# Patient Record
Sex: Male | Born: 1935 | Race: White | Hispanic: No | Marital: Married | Smoking: Former smoker
Health system: Southern US, Community
[De-identification: ages and names within clinical notes are randomized; demographics above are authoritative.]

## PROBLEM LIST (undated history)

## (undated) DIAGNOSIS — K219 Gastro-esophageal reflux disease without esophagitis: Secondary | ICD-10-CM

## (undated) DIAGNOSIS — N3090 Cystitis, unspecified without hematuria: Secondary | ICD-10-CM

## (undated) DIAGNOSIS — I5042 Chronic combined systolic (congestive) and diastolic (congestive) heart failure: Secondary | ICD-10-CM

## (undated) DIAGNOSIS — R001 Bradycardia, unspecified: Secondary | ICD-10-CM

## (undated) DIAGNOSIS — M2550 Pain in unspecified joint: Secondary | ICD-10-CM

## (undated) DIAGNOSIS — M1990 Unspecified osteoarthritis, unspecified site: Secondary | ICD-10-CM

## (undated) DIAGNOSIS — I714 Abdominal aortic aneurysm, without rupture, unspecified: Secondary | ICD-10-CM

## (undated) DIAGNOSIS — I441 Atrioventricular block, second degree: Secondary | ICD-10-CM

## (undated) HISTORY — PX: BACK SURGERY: SHX140

## (undated) HISTORY — DX: Bradycardia, unspecified: R00.1

## (undated) HISTORY — DX: Unspecified osteoarthritis, unspecified site: M19.90

## (undated) HISTORY — DX: Atrioventricular block, second degree: I44.1

## (undated) HISTORY — PX: TOTAL KNEE ARTHROPLASTY: SHX125

## (undated) HISTORY — DX: Abdominal aortic aneurysm, without rupture: I71.4

## (undated) HISTORY — DX: Abdominal aortic aneurysm, without rupture, unspecified: I71.40

## (undated) HISTORY — PX: EXCISIONAL HEMORRHOIDECTOMY: SHX1541

## (undated) HISTORY — DX: Chronic combined systolic (congestive) and diastolic (congestive) heart failure: I50.42

## (undated) HISTORY — PX: TYMPANOSTOMY TUBE PLACEMENT: SHX32

## (undated) HISTORY — PX: JOINT REPLACEMENT: SHX530

## (undated) HISTORY — PX: CATARACT EXTRACTION W/ INTRAOCULAR LENS  IMPLANT, BILATERAL: SHX1307

## (undated) HISTORY — PX: HEMORROIDECTOMY: SUR656

## (undated) HISTORY — PX: INSERT / REPLACE / REMOVE PACEMAKER: SUR710

## (undated) HISTORY — DX: Gastro-esophageal reflux disease without esophagitis: K21.9

## (undated) HISTORY — DX: Pain in unspecified joint: M25.50

---

## 1989-05-13 HISTORY — PX: INGUINAL HERNIA REPAIR: SUR1180

## 1997-05-13 HISTORY — PX: TOTAL SHOULDER REPLACEMENT: SUR1217

## 2001-10-23 ENCOUNTER — Encounter (HOSPITAL_COMMUNITY): Admission: RE | Admit: 2001-10-23 | Discharge: 2001-11-22 | Payer: Self-pay | Admitting: Orthopaedic Surgery

## 2001-12-25 ENCOUNTER — Encounter: Payer: Self-pay | Admitting: Orthopaedic Surgery

## 2001-12-31 ENCOUNTER — Inpatient Hospital Stay (HOSPITAL_COMMUNITY): Admission: RE | Admit: 2001-12-31 | Discharge: 2002-01-03 | Payer: Self-pay | Admitting: Orthopaedic Surgery

## 2003-05-14 HISTORY — PX: LUMBAR DISC SURGERY: SHX700

## 2003-12-07 ENCOUNTER — Encounter: Admission: RE | Admit: 2003-12-07 | Discharge: 2003-12-07 | Payer: Self-pay | Admitting: Family Medicine

## 2003-12-15 ENCOUNTER — Encounter: Admission: RE | Admit: 2003-12-15 | Discharge: 2003-12-15 | Payer: Self-pay | Admitting: Orthopaedic Surgery

## 2004-01-03 ENCOUNTER — Inpatient Hospital Stay (HOSPITAL_COMMUNITY): Admission: RE | Admit: 2004-01-03 | Discharge: 2004-01-04 | Payer: Self-pay | Admitting: Neurosurgery

## 2004-12-18 ENCOUNTER — Inpatient Hospital Stay (HOSPITAL_COMMUNITY): Admission: RE | Admit: 2004-12-18 | Discharge: 2004-12-20 | Payer: Self-pay | Admitting: Orthopaedic Surgery

## 2005-01-01 ENCOUNTER — Encounter (HOSPITAL_COMMUNITY): Admission: RE | Admit: 2005-01-01 | Discharge: 2005-02-08 | Payer: Self-pay | Admitting: Orthopaedic Surgery

## 2005-02-11 ENCOUNTER — Encounter (HOSPITAL_COMMUNITY): Admission: RE | Admit: 2005-02-11 | Discharge: 2005-03-13 | Payer: Self-pay | Admitting: Orthopaedic Surgery

## 2006-07-03 ENCOUNTER — Ambulatory Visit: Payer: Self-pay | Admitting: Internal Medicine

## 2006-07-07 ENCOUNTER — Ambulatory Visit (HOSPITAL_COMMUNITY): Admission: RE | Admit: 2006-07-07 | Discharge: 2006-07-07 | Payer: Self-pay | Admitting: Internal Medicine

## 2006-07-07 ENCOUNTER — Ambulatory Visit: Payer: Self-pay | Admitting: Internal Medicine

## 2006-08-20 ENCOUNTER — Ambulatory Visit: Payer: Self-pay | Admitting: Internal Medicine

## 2006-11-20 ENCOUNTER — Encounter: Admission: RE | Admit: 2006-11-20 | Discharge: 2006-11-20 | Payer: Self-pay | Admitting: Orthopaedic Surgery

## 2006-11-28 ENCOUNTER — Encounter (HOSPITAL_COMMUNITY): Admission: RE | Admit: 2006-11-28 | Discharge: 2006-12-28 | Payer: Self-pay | Admitting: Orthopaedic Surgery

## 2006-12-30 ENCOUNTER — Encounter (HOSPITAL_COMMUNITY): Admission: RE | Admit: 2006-12-30 | Discharge: 2007-01-29 | Payer: Self-pay | Admitting: Orthopaedic Surgery

## 2009-02-17 ENCOUNTER — Ambulatory Visit (HOSPITAL_COMMUNITY): Admission: RE | Admit: 2009-02-17 | Discharge: 2009-02-17 | Payer: Self-pay | Admitting: Family Medicine

## 2009-02-24 ENCOUNTER — Ambulatory Visit (HOSPITAL_COMMUNITY): Admission: RE | Admit: 2009-02-24 | Discharge: 2009-02-24 | Payer: Self-pay | Admitting: Family Medicine

## 2009-03-03 ENCOUNTER — Ambulatory Visit: Payer: Self-pay | Admitting: Vascular Surgery

## 2009-08-11 ENCOUNTER — Ambulatory Visit: Payer: Self-pay | Admitting: Vascular Surgery

## 2010-03-08 ENCOUNTER — Ambulatory Visit: Payer: Self-pay | Admitting: Vascular Surgery

## 2010-05-31 ENCOUNTER — Ambulatory Visit
Admission: RE | Admit: 2010-05-31 | Discharge: 2010-05-31 | Payer: Self-pay | Source: Home / Self Care | Attending: Otolaryngology | Admitting: Otolaryngology

## 2010-06-03 ENCOUNTER — Encounter: Payer: Self-pay | Admitting: Orthopaedic Surgery

## 2010-09-04 ENCOUNTER — Encounter (INDEPENDENT_AMBULATORY_CARE_PROVIDER_SITE_OTHER): Payer: Medicare Other

## 2010-09-04 DIAGNOSIS — I714 Abdominal aortic aneurysm, without rupture: Secondary | ICD-10-CM

## 2010-09-06 NOTE — Procedures (Unsigned)
DUPLEX ULTRASOUND OF ABDOMINAL AORTA  INDICATION:  Follow up abdominal aortic aneurysm.  HISTORY: Diabetes:  No. Cardiac:  No. Hypertension:  No. Smoking:  Previous. Connective Tissue Disorder: Family History: Previous Surgery:  No.  DUPLEX EXAM:         AP (cm)                   TRANSVERSE (cm) Proximal             2.2 cm                    2.3 cm Mid                  4.8 cm                    4.9 cm Distal               3.0 cm                    3.0 cm Right Iliac          1.5 cm                    2.3 cm Left Iliac           1.8 cm                    2.0 cm  PREVIOUS:  Date:  AP:  4.8  TRANSVERSE:  4.9  IMPRESSION:  Stable-appearing abdominal aortic aneurysm within the mid aorta with measurements of 4.9 X 4.8 cm with intraluminal thrombus and atherosclerosis visualized.  Bilateral ectatic iliacs with moderate atherosclerosis.  ___________________________________________ Larina Earthly, M.D.  OD/MEDQ  D:  09/04/2010  T:  09/04/2010  Job:  [id number]

## 2010-09-25 NOTE — Consult Note (Signed)
NEW PATIENT CONSULTATION   Shawn Frye, Shawn Frye  DOB:  02-27-1919                                       03/03/2009  CHART#:[id number]   PRESENTING ILLNESS:  Asymptomatic abdominal aortic aneurysm.   The patient presents today for evaluation of incidental finding of  abdominal aortic aneurysm.  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  He reports that  about 2 to 3 weeks ago, he had a sudden onset of pain in his back, this  extended into his left leg.  He reports this has become progressive in  nature since that time.  He reports that this is relieved slightly with  lying flat.  He reports this is a severe discomfort and he has to walk  with a walker due to this.  The back pain does continue down into his  leg.  He had a similar type of discomfort 5 years ago at which time he  had what sounds like microdiskectomy with complete improvement.  Part of  his workup included a MRI. I have this and have reviewed the actual  films and discussed it with the patient and his wife.  This does reveal  an infrarenal abdominal aortic aneurysm, maximal diameter approximately  4.9 cm.  He had no prior knowledge and no symptoms related to his  aneurysm.   PAST MEDICAL HISTORY:  Significant for no major medical difficulties.  He denies any cardiac or pulmonary dysfunction.   FAMILY HISTORY:  Negative for aneurysm disease.   SOCIAL HISTORY:  He is married, does not have children.  He quit smoking  in 1952 and does not drink alcohol.   PAST SURGICAL HISTORY:  Significant for hemorrhoidectomy, bilateral  hernia repair, total knee replacements bilaterally, shoulder replacement  and prior back surgery as described.   REVIEW OF SYSTEMS:  Weight is reported at 160 pounds.  He is 5 feet 8  inches tall.  He has no cardiac, pulmonary, GI or GU difficulty.  He  does have positive difficulty with musculoskeletal arthritic joint pain.  No  neurologic, psychiatric, hematologic or skin issues.   PHYSICAL EXAMINATION:  General:  He is a well developed white male in no  acute distress.  Neck:  He has no JVD, no neck masses and no carotid  bruits.  Lungs:  No tachypnea; chest is clear bilaterally with no  wheezes.  Cardiovascular:  He has a regular rhythm and normal heart  sounds.  He has mild left leg edema.  No edema on the right leg.  He  does have a 2+ posterior tibial pulses bilaterally.  Abdominal Exam;  Soft.  He has normal active bowel sounds.  He does have a prominent  aortic pulsation.  He is nontender over this.  Musculoskeletal:  No  deformities but he does have some cyanosis and some swelling in his left  leg. Neurological:  He has no focal deficits.  Skin: Warm with no  lesions or rashes.   I had a long discussion with the patient and his wife.  I reviewed the  MRI with him, explaining the significance of his infrarenal abdominal  aortic aneurysm.  I discussed the association of this with the disk  disease and do not feel that there is any reason why he cannot proceed  with any  of disk treatment required for relief of symptoms.  I did  review the records as provided by Dr. Gerda Diss.  He appears to be an  excellent candidate for aortic surgery should he require this, despite  his age of 70.  I reviewed symptoms of leaking aneurysm with him and do  not feel that any of his back symptoms are related to this.  I explained  importance of follow-up and we will see him in 6 months with repeat  ultrasound for long-term follow-up of his aneurysm size.   Larina Earthly, M.D.  Electronically Signed   TFE/MEDQ  D:  03/03/2009  T:  03/06/2009  Job:  [id number]   cc:   Donna Bernard, M.D.

## 2010-09-25 NOTE — Procedures (Signed)
DUPLEX ULTRASOUND OF ABDOMINAL AORTA   INDICATION:  Followup from abdominal aortic aneurysm.   HISTORY:  Diabetes:  No.  Cardiac:  No.  Hypertension:  No.  Smoking:  No.  Connective Tissue Disorder:  Yes.  Family History:  No.  Previous Surgery:  No.   DUPLEX EXAM:         AP (cm)                   TRANSVERSE (cm)  Proximal             2.3 Cm                    2.6 cm  Mid                  4.8 cm                    4.9 cm  Distal               2.6 cm                    3.2 cm  Right Iliac          1.6 cm                    2.0 cm  Left Iliac           1.6 cm                    2.1 cm   PREVIOUS:  Date:  AP:  TRANSVERSE:   IMPRESSION:  1. Stable mid distal abdominal aortic aneurysm measuring 4.9 x 4.8 cm.      No significant changes in size from previous ultrasound dated      08/11/2009.  2. Ectatic bilateral common iliac arteries noted.   ___________________________________________  Larina Earthly, M.D.   OD/MEDQ  D:  03/08/2010  T:  03/08/2010  Job:  [id number]

## 2010-09-25 NOTE — Procedures (Signed)
DUPLEX ULTRASOUND OF ABDOMINAL AORTA   INDICATION:  Followup abdominal aortic aneurysm.   HISTORY:  Diabetes:  No.  Cardiac:  No.  Hypertension:  No.  Smoking:  No.  Connective Tissue Disorder:  Family History:  No.  Previous Surgery:  No.   DUPLEX EXAM:         AP (cm)                   TRANSVERSE (cm)  Proximal             2.16 cm                   cm  Mid                  2.1 cm                    1.9 cm  Distal               4.62 (M/D) cm             4.91 cm  Right Iliac          1.83 (M) cm               1.95 cm  Left Iliac           1.71 (M) cm               1.92 cm   PREVIOUS:  Date:  02/27/2009 (MRI)  AP:  4.9  TRANSVERSE:  4.7   IMPRESSION:  1. Stable mid/distal abdominal aortic aneurysm measuring 4.62 cm x      4.91 cm when compared to previous MRI.  2. Ectatic bilateral common iliac arteries were noted.   ___________________________________________  Larina Earthly, M.D.   AS/MEDQ  D:  08/11/2009  T:  08/11/2009  Job:  [id number]

## 2010-09-28 NOTE — H&P (Signed)
NAMECAYLIN, RABY NO.:  [id number]   MEDICAL RECORD NO.:  [id number]          PATIENT TYPE:  INP   LOCATION:                               FACILITY:  [facility]   PHYSICIAN:  Claude Manges. Whitfield, M.D.DATE OF BIRTH:  10/08/1918   DATE OF ADMISSION:  12/18/2004  DATE OF DISCHARGE:                                HISTORY & PHYSICAL   CHIEF COMPLAINT:  Left shoulder pain.   HISTORY OF PRESENT ILLNESS:  Mr. Aultman is a pleasant 76 year old white male  with bilateral shoulder pain left greater than right.  Left shoulder pain  has been present for the past five to 10 years.  Pain became progressively  worse over the past year.  Pain in left shoulder radiates into the cervical  region.  He describes his pain as a constant throbbing pain worse with  writing and any movement of the left shoulder.  He does have waking pain.  He has failed conservative treatment which includes cortisone injections.   ALLERGIES:  NEUROXIN, PERCOCET, CELEBREX all cause rash.  ALEVE causes  difficulty swallowing.   CURRENT MEDICATIONS:  1.  Ciprofloxacin 500 mg p.o. daily.  2.  Tylenol p.r.n. pain.  3.  Darvocet-N 100 p.r.n. pain.   PAST MEDICAL HISTORY:  1.  Acute prostatitis currently being treated with Cipro.  2.  Occasional dysphagia.  3.  Patient denies any coronary artery disease, history of MI, diabetes, or      respiratory disease.   PAST SURGICAL HISTORY:  1.  Hemorrhoidectomy 1978.  2.  Hernia repair 1991.  3.  Cataract surgery 1992 and 1994.  4.  Bilateral knee replacements, left 1998, right 2003.  5.  Disk fragment removed from lower spine 2005.   Patient states he had difficulty waking up with the last knee replacement in  2003, otherwise no complications with the above procedures.  No blood  transfusions.   SOCIAL HISTORY:  Patient denies any tobacco or alcohol use.  He is married.  Lives in a one-story home with one step to the usual entrance.  He is a  retired Curator.  Primary care physician is Dr. Lubertha South (phone  number [phone]), [place].   FAMILY HISTORY:  Patient's mother deceased age 45 with pneumonia.  Father  deceased age 2 from congestive heart failure.  Has a total of seven  siblings and their past medical history is remarkable for irregular heart  rhythm.   REVIEW OF SYSTEMS:  Patient denies any recent cold, fever, flu-like  symptoms.  He does have some neck pain from time to time and this is, he  feels, due to his bilateral shoulder arthritis.  He denies any chest pain,  shortness of breath, PND, orthopnea.  Wears partial dentures both upper and  lower jaw lines.   Wears glasses at all times and hearing aids in both ears.   Patient denies any GI or GU conditions other than that of history of  dysphagia in the past.   He has a living will and a power of attorney, Erskine Squibb  [place]egtmeyer.   PHYSICAL EXAMINATION:  GENERAL:  Patient is a well-developed, well-  nourished, asthenic male.  Patient's mood and affect are appropriate.  Talks  easily with examiner.  VITAL SIGNS:  Height 5 feet 7-1/2 inches, weight 160 pounds.  Blood pressure  100/70, pulse 78, respiratory rate 16, temperature 98.1.  HEENT:  Head is normocephalic, atraumatic without frontal or maxillary sinus  tenderness to palpation.  Conjunctivae pink.  Sclerae non-icteric.  PERRLA.  EOMs are intact.  No visible external ear deformities noted.  TMs with some  scarring, but no signs of infection.  No bulging.  Nose and nasal septum  midline.  Nasal mucosa pink, moist without polyps.  Buccal mucosa pink and  moist.  Pharynx without erythema or exudate.  Tongue and uvula midline.  CARDIAC:  Regular rate and rhythm.  No murmurs, rubs, or gallops noted.  CHEST:  Lungs are clear to auscultation bilaterally.  No wheezing, rhonchi,  or rales noted.  ABDOMEN:  Soft, nontender.  Bowel sounds x4 quadrants.  No hepatomegaly,  splenomegaly.  NECK:  No  lymphadenopathy.  Carotids are 2+ without bruits.  No tenderness  along the cervical spine with palpation.  He does have cervical pain with  lateral bending left greater than right and with extension of the cervical  spine.  BACK:  Nontender with palpation over thoracic and lumbar spine.  GENITOURINARY:  Deferred at this time.  RECTAL:  Deferred at this time.  NEUROLOGIC:  Patient is alert and oriented x3.  Cranial nerves II-XII  grossly intact.  Deep tendon reflexes triceps bilaterally are 3+, biceps 2+,  and brachial radial reflexes are 2+ bilaterally.  Radial pulses are 2+  bilaterally.  MUSCULOSKELETAL:  Right shoulder:  Limited range of motion 90 degrees  forward flexion, abduction 90 degrees.  Pain with internal/external  rotation, mild crepitus.  Left shoulder:  Forward flexion 105 degrees,  abduction 90 degrees.  Has pain with internal/external rotation.  Moderate  crepitus with passive range of motion.  Internal rotation bilaterally  patient reached a level of S1.  Otherwise, upper extremities are equal in  size and shape bilaterally.  Has full range of motion of the elbows, wrists,  and hands.   X-RAYS:  X-rays to the left shoulder show end-stage osteoarthritis with  large osteophytes.   IMPRESSION:  1.  End-stage osteoarthritis bilateral shoulders with left pain greater than      right.  2.  Acute prostatitis.  3.  Occasional dysphagia.   PLAN:  Patient is to be admitted to [facility] on December 18, 2004 to  undergo a left shoulder hemiarthroplasty.  Patient will undergo all  preoperative laboratories and testing prior to surgery.  Patient did receive  surgical clearance from his primary care physician, Dr. Lubertha South.       GC/MEDQ  D:  12/05/2004  T:  12/05/2004  Job:  [id number]

## 2010-09-28 NOTE — Op Note (Signed)
NAME:  Shawn Frye, FRASIER NO.:  [id number]   MEDICAL RECORD NO.:  [id number]                   PATIENT TYPE:  INP   LOCATION:  5028                                 FACILITY:  [facility]   PHYSICIAN:  Claude Manges. Cleophas Dunker, M.D.            DATE OF BIRTH:  1918-07-20   DATE OF PROCEDURE:  12/31/2001  DATE OF DISCHARGE:  01/03/2002                                 OPERATIVE REPORT   PREOPERATIVE DIAGNOSIS:  End-stage osteoarthritis right knee.   POSTOPERATIVE DIAGNOSIS:  End-stage osteoarthritis right knee.   OPERATION PERFORMED:  Right total knee replacement.   SURGEON:  Claude Manges. Cleophas Dunker, M.D.   ASSISTANT:  Jerolyn Shin. Tresa Res, M.D.   ANESTHESIA:  General orotracheal.   COMPLICATIONS:  None.   COMPONENTS:  DePuy LCS complete, standard plus femoral component, component  #4 rotating tibial platform with a 10 mm bridging bearing in a cruciate  design metal back patella; all were secured with polymethyl methacrylate.   DESCRIPTION OF PROCEDURE:  With the patient comfortable in the operating  table and under general orotracheal anesthesia the nursing staff inserted a  Foley catheter.  A right thigh tourniquet was then applied.  The right knee  was then prepped with Betadine scrub and DuraPrep from the tourniquet to the  midfoot.  Sterile draping was performed.  With the extremity still elevated  it was Esmarch exsanguinated with a proximal tourniquet at 350 mmHg.   A midline longitudinal incision was made centered about the patella  extending from the superior pouch of the tibial tubercle.  Via sharp  dissection the incision was carried down to the subcutaneous tissue.  The  first layer of capsule was incised in the midline.  A deep medial  parapatellar incision was then made through the deep capsule.  There was  approximately 15 cc of clear yellow joint effusion.  The patella was everted  180 degrees, the knee flexed to 90 degrees.  There were moderate size  osteophytes along the medial and lateral femoral condyle, complete loss of  articular cartilage along the medial femoral condyle with considerable  chondromalacia of the lateral compartment as well as the patellofemoral  joint.   Preoperatively we had measured a #4 tibial component and a standard plus  femoral component.  These were confirmed intraoperatively.  The appropriate  femoral and tibial jigs were then applied to obtain the appropriate femoral  and tibial cuts.  A 10 mm flexion extension gap with symmetrical ACL and PCL  were sacrificed.  MCL and LCL remained intact.  I used a 4-degree distal  femoral valgus cut.  Osteophytes were removed.  Synovectomy was performed.  Lamina spreader was inserted along the medial and lateral compartments to  remove remnants of menisci as well as ACL and PCL.  I did not have to remove  any osteophytes from the posterior femoral condyles.  The final cut was made  on the tibia to accept the keeled component.  The trial components were then  impacted followed by the 10 mm bridging bearing and we had excellent range  of motion i.e. full extension, full flexion, no malrotation or dislocation  of the tibial component; the ligament balance was perfect.  The patella was  then prepared by removing 10 mm of bone leaving approximately 13 mm of  patellar thickness.  The cruciate designed jig was then applied and a  cruciate cut then made in the patella.  The trial patella was applied.  The  patella was reduced and tracked in the midline.  Trial components were  removed.  The joint was then copiously irrigated with jet saline, lavage and  antibiotic solution.   Each of the final components were then inserted and secured with polymethyl  methacrylate.  Extraneous methacrylate was removed both when it was soft and  then single layers were removed where it had hardened after maturation.  The  joint was again irrigated with saline solution.  We again checked  rotation  and ligament stability, and was intact.   The tourniquet was deflated and bleeders were Bovie coagulated.  Hemovac was  not necessary.  Deep capsule was closed with interrupted #1 Ethibond.  Superficial capsule closed with running 0-Vicryl, subcu with 2-0 Vicryl and  skin closed with skin clips.  Sterile bulky dressing was applied followed by  the patient's support stocking.  Dr. Sondra Come was to insert a right femoral  nerve block for postoperative pain control.  The patient tolerated the  procedure without complications.                                               Claude Manges. Cleophas Dunker, M.D.    PWW/MEDQ  D:  12/31/2001  T:  01/03/2002  Job:  [id number]

## 2010-09-28 NOTE — Op Note (Signed)
NAME:  Shawn Frye, Shawn Frye NO.:  [id number]   MEDICAL RECORD NO.:  [id number]          PATIENT TYPE:  AMB   LOCATION:  DAY                           FACILITY:  [facility]   PHYSICIAN:  Lionel December, M.D.    DATE OF BIRTH:  03-19-1919   DATE OF PROCEDURE:  07/07/2006  DATE OF DISCHARGE:                               OPERATIVE REPORT   PROCEDURE:  Esophagogastroduodenoscopy with esophageal dilation.   INDICATIONS:  [name] is an 77 year old Caucasian male with a sporadic  episodes of dysphagia relieved spontaneously who recently had two  episodes of food impaction finally relieved with regurgitation.  He  denies history of heartburn, odynophagia, melena or weight loss.  Procedure risks were reviewed with the patient, informed consent was  obtained.   MEDS FOR CONSCIOUS SEDATION:  Benzocaine spray for pharyngeal topical  anesthesia, Demerol 25 mg IV, Versed 3 mg IV.   FINDINGS:  Procedure performed in endoscopy suite.  The patient's vital  signs and O2 sat were monitored during procedure and remained stable.  The patient was placed left lateral recumbent position and Pentax  videoscope was passed via oropharynx without any difficulty into  esophagus.   Esophagus.  Mucosa of the esophagus was normal except was a stricture at  GE junction with a small triangular ulcer.  GE junction was at 39 cm  from the incisors.  This stricture was initially dilated by passing the  scope.  Size estimated to be 9 to 10 mm.  Hiatus at 41.   Stomach was empty and distended very well insufflation.  Folds of  proximal stomach were normal.  Examination mucosa revealed patchy  erythema and granularity in body and antrum but no erosions, ulcers  noted.  Pyloric channel was patent.  Angularis, fundus and cardia  examined by retroflexing scope and were normal.   Duodenum.  Bulbar mucosa revealed patchy erythema and few punctate  erosions but no ulcer crater was found.  Scope was passed second  part of  duodenum where mucosa and folds were normal.  Endoscope was pulled back  in the stomach.   Balloon dilator was passed through the channel and positioned across the  stricture by withdrawing the scope into body of the esophagus.  This  stricture was dilated initially to 15 mm and subsequent to 16.5 mm.  There was mucosal disruption with minimal oozing.  This stopped  spontaneously.  Pictures taken for the record.  Endoscope was withdrawn.  The patient tolerated the procedure well.   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  Small sliding hiatal hernia.  Gastroduodenitis.   RECOMMENDATIONS:  1. Soft diet for 48 hours.  2. Omeprazole 20 mg p.o. q.a.m. Prescription given for 30 with 11      refills.  3. H pylori serology be checked today.   He will return for OV in two months and will determine whether not this  stricture needs to be redilated.      Lionel December, M.D.  Electronically Signed  NR/MEDQ  D:  07/07/2006  T:  07/07/2006  Job:  [id number]   cc:   Lorin Picket A. Gerda Diss, MD  Fax: [phone]

## 2010-09-28 NOTE — Op Note (Signed)
NAME:  Shawn Frye, LEWINSKI NO.:  [id number]   MEDICAL RECORD NO.:  [id number]                   PATIENT TYPE:  INP   LOCATION:  2872                                 FACILITY:  [facility]   PHYSICIAN:  Clydene Fake, M.D.               DATE OF BIRTH:  1919-04-11   DATE OF PROCEDURE:  01/03/2004  DATE OF DISCHARGE:                                 OPERATIVE REPORT   DIAGNOSIS:  Herniated nucleus pulposus, right L5-S1.   POSTOPERATIVE DIAGNOSIS:  Herniated nucleus pulposus, right L5-S1.   PROCEDURE:  Right L5-S1 semihemilaminectomy and diskectomy, microdissection  with microscope.   SURGEON:  Clydene Fake, M.D.   ASSISTANT:  Coletta Memos, M.D.   General endotracheal tube anesthesia.   ESTIMATED BLOOD LOSS:  Minimal.   BLOOD REPLACED:  None.   DRAINS:  None.   COMPLICATIONS:  None.   REASON FOR PROCEDURE:  The patient is an 77 year old gentleman who has acute-  onset severe pain radiating down his right leg to the foot with numbness.  On exam he does have some decreased strength in dorsiflexion in the EHL, and  MRI shows a large disk herniation at L5-S1 with extruded fragments going  cephalad, compressing the L5 root.  The patient brought in for  decompression.   PROCEDURE IN DETAIL:  The patient was brought into the operating room,  general anesthesia induced.  The patient was placed in prone position on the  Wilson frame with all pressure points padded.  The patient was prepped and  draped in a sterile fashion and the site of incision was injected with 10 mL  1% lidocaine with epinephrine.  Incision was then made over the L5-S1  spinous process and the incision taken down to the fascia.  Hemostasis  obtained with Bovie cauterization.  The fascia was incised with the Bovie  and subperiosteal dissection was done over the L5 and S1 spinous processes  and laminae out to the facet.  A marker was placed in the interspace, and  this was at the L5-S1  interspace.  Obtained pictures.  A microscope was  brought in for microdissection at this point.  A high-speed drill was used  to perform the semihemilaminectomy and medial facetectomy.  This was  completed with Kerrison punches and ligamentum flavum was then removed with  Kerrison punches and a foraminotomy was done over the S1 root.  We dissected  down into the disk space and saw a large mass just cephalad to the disk  space consistent with what we saw on MRI.  We carefully dissected around  this mass, trying to make sure exactly what this was as we dissected the  vessels off, and disk material was seen.  We were then able to remove this  free-fragment disk in multiple pieces using nerve hooks and pituitary  rongeurs to sweep the disk out from this space.  We found more pieces  laterally.  We also removed those and when we were finished, we had a good  decompression of the thecal sac and the L5 root.  We did not enter the disk  space.  We irrigated with antibiotic solution, got hemostasis with Gelfoam  and thrombin.  We then irrigated Gelfoam and thrombin out.  We had good  hemostasis, and the retractors were removed and the fascia closed with 0  Vicryl interrupted suture, the subcutaneous tissue closed with 0, 2-0, and 3-  0 Vicryl interrupted suture, and the skin closed with Benzoin and Steri-  Strips.  Dressing was placed.  The patient was placed back into a supine  position, awoken from anesthesia, and transferred to the recovery room in  stable condition.                                               Clydene Fake, M.D.    JRH/MEDQ  D:  01/03/2004  T:  01/04/2004  Job:  [id number]

## 2010-09-28 NOTE — H&P (Signed)
NAME:  Shawn Frye, Shawn Frye NO.:  [id number]   MEDICAL RECORD NO.:  [id number]          PATIENT TYPE:  AMB   LOCATION:  DAY                           FACILITY:  [facility]   PHYSICIAN:  Lionel December, M.D.    DATE OF BIRTH:  May 24, 1918   DATE OF ADMISSION:  DATE OF DISCHARGE:  LH                              HISTORY & PHYSICAL   CHIEF COMPLAINT:  Food getting caught in esophagus.   PRIMARY CARE PHYSICIAN:  Donna Bernard, M.D.   HISTORY OF PRESENT ILLNESS:  Mr. Shiraishi is an 77 year old Caucasian  gentleman who complains of intermittent solid food esophageal dysphagia.  He states he has had this off and on for several months. It has been  quite sporadic two weeks ago, however, it had happened 2 days in the  row.  At times, the food gets lodges and actually regurgitates back up.  Other times he is able to wash it down with liquids.  Denies any  odynophagia.  He has intermittent heartburn.  He may go several months  at a time without any symptoms.  Other times he may have heartburn for a  week at a time.  He treats this with over-the-counter antacids.  He  denies any abdominal pain , weight loss, constipation, diarrhea, melena  or rectal bleeding.  He has never had a colonoscopy, simply states he  doesn't think he needs one.   CURRENT MEDICATIONS:  Cipro for 4 weeks, Aleve as needed.   ALLERGIES:  PAIN PILLS.   PAST MEDICAL HISTORY:  Prostatitis.   PAST SURGICAL HISTORY:  He has had hemorrhoidectomy, bilateral inguinal  hernia repair, bilateral knee replacement, the first time 9 years ago,  the 2nd time 3 years ago.  He had a shoulder replacement 2 years ago,  back surgery and cataract extraction.   FAMILY HISTORY:  Negative for colorectal cancer.  He has a brother who  is a patient of ours who has recurrent esophageal strictures.   SOCIAL HISTORY:  He is married with no children.  He is retired.  He  quit smoking over 60 years ago.  No alcohol use.   REVIEW OF  SYMPTOMS:  See HPI for GI.  CONSTITUTIONAL/CARDIOPULMONARY:  No chest pain or shortness of breath.   PHYSICAL EXAMINATION:  VITAL SIGNS:  Weight 164, height 5 feet 8 inches,  temperature 97.6, blood pressure 118/78, pulse 62.  GENERAL:  Pleasant, well nourished, well nourished, well developed  Caucasian male in no acute distress.  SKIN:  Warm and dry.  No jaundice.  HEENT:  Sclera nonicteric.  Oropharynx is moist and pink, no lesions,  erythema or exudate. No lymphadenopathy or thyromegaly.  CHEST:  Lungs clear to auscultation.  CARDIAC: Exam reveals regular rate and rhythm, normal S1, S2, no  murmurs, rubs or gallops.  ABDOMEN:  Positive bowel sounds.  Soft, nontender, nondistended. No  organomegaly or masses.  No rebound tenderness or guarding.  No  abdominal bruits or hernias.  EXTREMITIES:  No edema.   IMPRESSION:  The patient is an 77 year old gentleman with intermittent  solid  food esophageal dysphagia as well as intermittent gastroesophageal  reflux disease symptoms.  He may have developed esophageal stricture.   PLAN:  1. Esophagogastroduodenoscopy  with esophageal dilatation with Dr.      Karilyn Cota in the near future.  2. May continue over-the-counter antacids for now.  3. Discussed possibility of colonoscopy but at this point, the patient      is not interested.      Tana Coast, P.A.      Lionel December, M.D.  Electronically Signed    LL/MEDQ  D:  07/03/2006  T:  07/03/2006  Job:  [id number]

## 2010-09-28 NOTE — Op Note (Signed)
Shawn Frye, Shawn Frye NO.:  [id number]   MEDICAL RECORD NO.:  [id number]          PATIENT TYPE:  INP   LOCATION:  2899                         FACILITY:  [facility]   PHYSICIAN:  Claude Manges. Whitfield, M.D.DATE OF BIRTH:  Dec 08, 1918   DATE OF PROCEDURE:  12/18/2004  DATE OF DISCHARGE:                                 OPERATIVE REPORT   PREOPERATIVE DIAGNOSIS:  Primary end stage osteoarthritis, left shoulder.   POSTOPERATIVE DIAGNOSIS:  Primary end stage osteoarthritis, left shoulder.   OPERATION PERFORMED:  Hemiarthroplasty, left shoulder.   SURGEON:  Claude Manges. Cleophas Dunker, M.D.   ASSISTANT:  1.  Lenard Galloway. Chaney Malling, M.D.  2.  Legrand Pitts. Duffy, P.A.   ANESTHESIA:  General orotracheal anesthesia with supplemental interscalene  nerve block.   COMPLICATIONS:  None.   DESCRIPTION OF PROCEDURE:  With the patient comfortable on the operating  table and under general orotracheal anesthesia, with a supplemental  interscalene nerve block, the patient was placed in semisitting position  with the shoulder frame.  The left upper extremity was then prepped with  Betadine scrub and then DuraPrep from the base of neck circumferentially to  well below the elbow.  Sterile draping was performed.   The patient has primary end stage osteoarthritis.  He had approximately 30  to 40 degrees of external rotation, approximately 80 degrees of abduction  and about 110 degrees of flexion with crepitation.   A deltopectoral groove incision was utilized and by sharp dissection carried  down to subcutaneous tissue.  Gross bleeders were Bovie coagulated.  By  blunt dissection, the deltopectoral interval was identified.  The cephalic  vein was also identified and retracted laterally.  There was very minimal  bleeding.  Self-retaining retractors were inserted.  The clavipectoral  fascia was then incised.   Bony landmarks were identified including the biceps groove, the greater and  lesser  tuberosities.  At a point a fingerbreadth medial to the lesser  tuberosity, the subscapularis muscle was incised from its attachment and  tagged superiorly and inferiorly with #1 Ethibond suture.  The capsule was  taken with the subscapularis but then debrided from the subscapularis to  prevent contractures.   The capsule was released along the inferior border of the humeral neck.  There were multiple large osteophytes that were removed with an oscillating  saw and a rongeur.  The biceps tendon was identified and carefully retracted  out of the operative field.  I did not see a rotator cuff tear.   A small drill hole was then made at the superior aspect of the humeral head.  The humeral head was completely devoid of articular cartilage.  Subsequent  reaming was performed to 14.  Using the intra-articular rasp as a baseline,  the intramedullary guide was then applied to obtain the appropriate cut and  angle of cut on the humeral head.  Oscillating saw was used to remove a  wafer of bone.  Rasping was then performed to 14 which fit very snugly and  completely on the humeral head cut. We trialed several head and neck  sizes  and it was felt that the 48 mm outer diameter humeral head with a 15 mm neck  was an excellent fit.  We had perfect retroversion on the head and we  allowed about 30 to 40 degrees of anterior and posterior subluxation.  The  joint did not appear to be overstuffed.  The level of the greater trochanter  was at about 1.5 to 2 cm lateral to the acromion.  There was no evidence of  impingement as the head sat at the level just above the greater trochanter  tuberosity.   Further capsule was then debrided to be sure that there was not any  contracture.  We checked to be sure there were no osteophytes remaining  either posteriorly or anteriorly or inferiorly.  The wound was copiously  irrigated with saline solution.  The final global advantage Depuy 14 mm  humeral stem was then  impacted flush on the cut.  Morris taper area was then  cleaned with saline and dried.  Then the 48 mm outer diameter 15 mm head was  then impacted on the humeral component.  The joint was inspected for any  loose material.  The head was then reduced with excellent range of motion  and no evidence of impingement.  The subscapularis was then closed  anatomically with #1 Ethibond.  Deltopectoral groove closed with an  interrupted 0 Vicryl, the subcu closed with 2-0 Vicryl, the skin closed with  skin clips.  Sterile bulky dressing was applied followed by sling.   The patient tolerated the procedure without complications.      Claude Manges. Cleophas Dunker, M.D.  Electronically Signed     PWW/MEDQ  D:  12/18/2004  T:  12/18/2004  Job:  [id number]

## 2010-09-28 NOTE — Discharge Summary (Signed)
NAME:  Shawn Frye, Shawn Frye NO.:  [id number]   MEDICAL RECORD NO.:  [id number]                   PATIENT TYPE:   LOCATION:                                       FACILITY:   PHYSICIAN:  Claude Manges. Cleophas Dunker, M.D.            DATE OF BIRTH:  Jul 31, 1918   DATE OF ADMISSION:  12/31/2001  DATE OF DISCHARGE:  01/03/2002                                 DISCHARGE SUMMARY   ADMISSION DIAGNOSES:  Severe osteoarthritis, right knee.  1. History of frequent urinary tract infections.  2. 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   DISCHARGE DIAGNOSES:  1. Right total knee arthroplasty.  2. Urinary retention.  3. 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  4. History of frequent urinary tract infections.   HISTORY OF PRESENT ILLNESS:  The patient is an 77 year old male with four to  five month history of right knee pain. It is sudden in onset. It is constant  with any weight bearing activity. It improves with rest. It does not bother  him at night. He describes the pain as a sharp sensation. It is located  along the medial joint line. No relief with cortisone injections. He does  note a popping sensation and the sensation of it giving out. He is not using  any assistive device.   ALLERGIES:  Norfloxacin.  1. Percocet.  2. Aleve.  3. Sleeping pills cause him confusion.   CURRENT MEDICATIONS:  1. Tylenol 325 mg p.o. q.4h. p.r.n. pain  2. Ferrous sulfate one tablet p.o. q.d.   SURGICAL PROCEDURE:  On 12/31/01, the patient was taken to the OR by Dr.  Norlene Campbell assisted by Dr. Vear Clock. Under general anesthesia, the  patient underwent a right total knee replacement with a DePuy LCS complete  standard plus femoral component, a #4 rotating tibial platform with a 10-mm  bridging bearing, a cruciate design back patella, all secured with  polymethylmethacrylate. The patient tolerated the procedure well. No Hemovac  drains were left in place.  There were no complications. The patient received  a femoral nerve block postoperatively to assist in pain control.   CONSULTANTS:  The following routine consults were requested:  Physical  therapy, rehab, case management.   HOSPITAL COURSE:  On 12/31/01, the patient was admitted to [facility] under the care of Dr. Norlene Campbell. The patient was taken to the  OR where a right total knee arthroplasty was performed. The patient  tolerated the procedure well and was transferred to the recovery room and  then to the orthopedic floor after receiving a postoperative femoral nerve  block. He was started on Coumadin for routine DVT prophylaxis.   The patient then incurred a total of three days postoperative care on the  orthopedic floor in which the patient did very well with physical therapy.  The patient was able to ambulate at least 200  feet the first day  postoperatively with close supervision. His wound remained benign for any  signs of infection. His leg remained neural motor vascularly intact. His  vital signs remained stable. The only complaint patient had was difficulty  with urination in which he was started on Urecholine and Flomax with gradual  improvement. The patient did have some confusion with Demerol, so he was  discharged on Darvocet with Demerol backup if the pain was not relieved with  Darvocet. On postoperative day #3, the patient was felt to be orthopedically  ready for discharge home, so arrangements were made for home health, and he  was discharged in good condition.   LABORATORY DATA:  EKG on admission was normal sinus rhythm with a first  degree AV block with occasional premature ectopic complex at 67 beats per  minute.   H and H on 8/21:  Hemoglobin 10.1, hematocrit 30.0, with an INR of 1.4.   Routine chemistries on 8/22:  Sodium 139, potassium 3.8, glucose 130, BUN  10, creatinine 0.8. Routine urinalysis found moderate leukocyte esterase on  admission  but no bacteria.   MEDICATIONS ON DISCHARGE:  1. Colace 100 mg p.o. b.i.d.  2. Laxative or enema of choice p.r.n.  3. Tylenol 650 mg p.o. q.4h. p.r.n.  4. Robaxin 500 mg p.o. q.6h. p.r.n.  5. Heparin 3,000 units subcu q.12h. until Coumadin therapeutic.  6. Demerol 50 to 100 mg p.o. q.4h. p.r.n.  7. Coumadin 5 mg p.o. q.d.   DISCHARGE INSTRUCTIONS:  MEDICATIONS:  1. Mepergan Fortes for severe pain one tablet every four hours.  2. Darvocet one to two tablets every four to six hours p.r.n. pain.  3. Laxative of choice.  4. Flomax 0.4 mg one tablet a day for 10 days.  5. Coumadin 5 mg a day until otherwise told by [place] pharmacist.   PAIN MANAGEMENT:  Tilden Fossa or Darvocet.   ACTIVITY:  No restrictions.   DIET:  No restrictions.   WOUND CARE:  Keep wound clean and dry.   FOLLOWUP:  Call for a followup appointment in 10 days with Dr. Cleophas Dunker,  [phone].   CONDITION ON DISCHARGE:  Improved.     Jamelle Rushing, P.A.                      Claude Manges. Cleophas Dunker, M.D.    RWK/MEDQ  D:  02/13/2002  T:  02/17/2002  Job:  [id number]

## 2010-09-28 NOTE — H&P (Signed)
NAME:  Shawn Frye, Shawn Frye                            ACCOUNT NO.:  [id number]   MEDICAL RECORD NO.:  [id number]                   PATIENT TYPE:  INP   LOCATION:  NA                                   FACILITY:  [facility]   PHYSICIAN:  Claude Manges. Cleophas Dunker, M.D.            DATE OF BIRTH:  08-27-1918   DATE OF ADMISSION:  12/31/2001  DATE OF DISCHARGE:                                HISTORY & PHYSICAL   CHIEF COMPLAINT:  Sudden onset of right knee pain for the last 4-5 months.   HISTORY OF PRESENT ILLNESS:  This 77 year old white male patient presented  to Dr. Cleophas Dunker with a history of a left knee replacement by Dr. Cleophas Dunker  on Oct 05, 1996.  He has done very well following that left knee replacement  and reports now he has had a 4-5 month history of right knee pain.   He reports the right knee pain was fairly sudden in onset.  He has had no  known injury or prior surgery to his knee.  The right knee pain is pretty  much constant with any weightbearing on the knee; otherwise, when he is just  resting it really does not bother him except at night.  The pain is  described as a real sharp sensation which at night is more of an aching.  The pain seems to be located about the medial joint line, especially when he  drives he complains of some radiation of the pain up into his hip.  The pain  does increase at night and also with any weightbearing.  He says Tylenol  does help the pain some.  He did have a cortisone shot on his last visit to  Dr. Cleophas Dunker and he reports that gave him relief for only one day.  He does  occasionally take Tylenol for pain.  The knee does pop and it feels like it  almost gives way at times.  He does have a little bit of swelling but denies  any locking, catching or grinding.  He does not currently ambulate with any  assistive devices.   ALLERGIES:  1. NORFLOXACIN caused hives.  2. PERCOCET caused hives.  3. ALEVE caused esophageal edema with difficulty swallowing.  4. SLEEPING PILL he got on his last admission caused confusion.   CURRENT MEDICATIONS:  1. Tylenol 325 mg 1-2 p.o. q.4h. p.r.n. for pain.  2. Ferrous sulfate one tablet p.o. q.d.   PAST MEDICAL HISTORY:  He denies any history of diabetes mellitus,  hypertension, thyroid disease, hiatal hernia, peptic ulcer disease, heart  disease, asthma or any other chronic medical condition.   PAST SURGICAL HISTORY:  1. Hemorrhoidectomy by Dr. Ilona Sorrel in 1978.  2. Bilateral inguinal hernia repair by Dr. Arna Snipe in 1991.  3. Bilateral cataract removal with lens implantation by Dr. Jamelle Haring;     one done in 1992 and one done in  1994.  4. Left total knee replacement by Dr. Norlene Campbell Oct 05, 1996.   SOCIAL HISTORY:  He has an 8 to 10 pack-year history of cigarette smoking  which he quit in 1962.  He does not drink any alcohol or use any drugs.  He  is married and lives with his wife in a one-story house with one step into  the main entrance.  They do not have any children.  He is a retired [employer].  His medical doctor is Dr. Simone Curia in [place],  [place], and his telephone number is [phone].   FAMILY HISTORY:  His mother died at the age of 80 due to complications from  pneumonia.  His father died at age 52 with a history of coronary artery  disease and heart failure.  He has two brothers who are alive, one age 64  with osteoarthritis, one age 57 with osteoarthritis.  He had two brothers  who passed away, one age 5 with heart disease, and one age 22 with  hepatitis.  He has two sisters who are living, age 19 and 72, and they are  healthy.  He had one sister who passed away at age 30 with cancer and one  sister who passed away as an infant.   REVIEW OF SYSTEMS:  He does wear partial dentures in both upper and lower  jaw lines.  He wears glasses at all times.  He complains of some nasal  congestion at night, probably due to a remote nasal  fracture that causes him  to have to breathe through his mouth and wake up at times.  He does have  osteoarthritis in his cervical spine with some radiculopathy into his left  shoulder.  That has been treated with a course of physical therapy and he  has had great relief with that.  He does have a history of a bladder  infection about every year and is followed by a urologist in [place],  [place], Dr. Greig Castilla.  He has had no problems with a bladder infection  recently.  He does have a living will and his power of attorney is his wife,  Lisette Abu.  All other systems are negative and noncontributory.   PHYSICAL EXAMINATION:  GENERAL:  This is a well-developed, well-nourished,  thin, white male who walks with a right-sided limp.  He does appear to be  slightly hard of hearing with conversation but otherwise alert and oriented  times three.  He is accompanied by his wife.  Height is 5 feet 8 inches,  weight 154 pounds, BMI is 24.   VITAL SIGNS:  Temperature is 97.7 degrees Fahrenheit, pulse 76, respirations  18, and blood pressure 126/70.   HEENT:  Normocephalic and atraumatic without frontal or maxillary sinus  tenderness to palpation.  Conjunctivae are pink and sclerae are anicteric.  He does have a slightly gray ring around both irises.  Pupils equal, round  and reactive to light and accommodation.  Extraocular muscles intact.  No  visible external ear deformities.  Hearing is grossly intact.  Tympanic  membranes are pearly gray bilaterally with good light reflex.  There is a  moderate amount of cerumen in the right ear canal.  Nose and nasal septum  appears midline.  Nasal mucosa pink and moist without exudates or polyps  noted.  Buccal mucosa pink and moist.  Good dentition with several caps and  fillings noted.  Pharynx without erythema or exudate.  Tongue  and uvula  midline.  Tongue without fasciculations, and the uvula rises equally with phonation.   NECK:  No visible masses  or lesions noted.  Trachea midline.  No palpable  lymphadenopathy or thyromegaly.  Carotids +2 bilaterally without bruit.  He  has good flexion and extension of his cervical spine but seems to have  slightly decreased rotation to either side, and only about 5 to 10 degrees  of lateral bending to each side.  No real pain with palpation along the  cervical spine at this time.   CARDIOVASCULAR:  Heart rate and rhythm regular.  S1 and S2 present without  rubs, clicks or murmurs noted.   RESPIRATORY:  Respirations are even and unlabored.  Breath sounds are clear  to auscultation bilaterally without rales or wheezes noted.   ABDOMEN:  Fairly flat abdominal contour with a well-healed, low suprapubic  incision line.  Bowel sounds present x4 quadrants.  He is soft and nontender  to palpation without hepatosplenomegaly or costovertebral angle tenderness.  Femoral pulses are +2 bilaterally.  Nontender to palpation along the entire  length of the vertebral column.   BREASTS/GU/RECTAL:  These examinations are deferred at this time.   MUSCULOSKELETAL:  No obvious deformities to the bilateral upper extremities  with full range of motion of his elbows, wrists and fingers bilaterally.  Radial pulses are +2.  He does have decreased range of motion of both  shoulders, but this is equal bilaterally.  He only has about 120 degrees of  forward flexion of both shoulders and 90 degrees of abduction.  Internal and  external rotation of the shoulders appear to be decreased, but he is able to  touch his back and behind his head without difficulty to either shoulder.  He has full range of motion of his bilateral hips, ankles and toes.  Bilateral dorsalis pedis and posterior tibialis pulses are +2.  No lower  extremity pedal edema.   The left knee has a well-healed midline incision line.  There is no erythema  or ecchymosis.  He has full extension and flexion to 120 degrees without  crepitus.  There is no pain  with palpation along either the medial or  lateral joint line.  No effusion.  He does open about 5 to 10 degrees  medially with a valgus stress.  No opening with varus stress.  Knee is  stable with negative anterior drawer.  The right knee has a +1 to 2 effusion  but the skin is otherwise intact without erythema or ecchymosis.  There is a  moderate amount of crepitus with range of motion of the knee, and he has  full extension and flexion to 120 degrees also.  He does have pain with  palpation along the medial joint line of the right knee, none really  laterally.  He does not open to varus or valgus stress and negative anterior  drawer.   NEUROLOGIC:  Alert and oriented times three.  Cranial nerves II through XII  are grossly intact.  Strength is 5 out of 5 in the bilateral upper and lower  extremities.  Rapid alternating movements are intact.  Deep tendon reflexes are 2+ bilaterally in the upper and lower extremities.   RADIOLOGIC FINDINGS:  X-rays taken of his right knee show a decrease in the  medial joint space of the knee consistent with osteoarthritis.   IMPRESSION:  1. Osteoarthritis of the right knee status post left knee replacement in     1998.  2. History of yearly bladder infections.  3. Degenerative disc disease of the cervical spine.   PLAN:  The patient will be admitted to [facility] on December 31, 2001, where he will undergo a right total knee arthroplasty by Dr. Norlene Campbell.  He has been seen by Dr. Simone Curia and cleared for surgery  preoperatively.  The patient has also donated two units of packed red blood  cells for surgery that he will have available if needed.     Legrand Pitts Duffy, P.A.                      Claude Manges. Cleophas Dunker, M.D.    KED/MEDQ  D:  12/18/2001  T:  12/22/2001  Job:  [id number]

## 2010-12-27 ENCOUNTER — Ambulatory Visit (INDEPENDENT_AMBULATORY_CARE_PROVIDER_SITE_OTHER): Payer: Medicare Other | Admitting: Otolaryngology

## 2010-12-27 DIAGNOSIS — H7200 Central perforation of tympanic membrane, unspecified ear: Secondary | ICD-10-CM

## 2010-12-27 DIAGNOSIS — J342 Deviated nasal septum: Secondary | ICD-10-CM

## 2010-12-27 DIAGNOSIS — J310 Chronic rhinitis: Secondary | ICD-10-CM

## 2011-01-23 ENCOUNTER — Encounter: Payer: Self-pay | Admitting: Vascular Surgery

## 2011-03-11 ENCOUNTER — Encounter: Payer: Self-pay | Admitting: Vascular Surgery

## 2011-03-12 ENCOUNTER — Ambulatory Visit (INDEPENDENT_AMBULATORY_CARE_PROVIDER_SITE_OTHER): Payer: Medicare Other | Admitting: Vascular Surgery

## 2011-03-12 ENCOUNTER — Encounter: Payer: Self-pay | Admitting: Vascular Surgery

## 2011-03-12 ENCOUNTER — Encounter (INDEPENDENT_AMBULATORY_CARE_PROVIDER_SITE_OTHER): Payer: Medicare Other | Admitting: *Deleted

## 2011-03-12 VITALS — BP 150/90 | HR 80 | Ht 68.0 in | Wt 158.9 lb

## 2011-03-12 DIAGNOSIS — I714 Abdominal aortic aneurysm, without rupture: Secondary | ICD-10-CM

## 2011-03-12 NOTE — Progress Notes (Signed)
The patient presents today for continued followup of his asymptomatic infrarenal abdominal aortic aneurysm. He continues to be in excellent health and will turn 89 in 2 months. Has no major medical difficulties specifically no cardiac disease and no hypertension. His aneurysm has been followed with serial ultrasounds for a number of years with no progression. He has no specific symptoms related to his aneurysm.  Past Medical History  Diagnosis Date  . Arthritis   . GERD (gastroesophageal reflux disease)   . Joint pain   . AAA (abdominal aortic aneurysm)     History  Substance Use Topics  . Smoking status: Former Smoker    Types: Cigarettes    Quit date: 05/13/1950  . Smokeless tobacco: Not on file  . Alcohol Use: No    No family history on file.  Allergies  Allergen Reactions  . Percocet (Oxycodone-Acetaminophen)   . Celebrex (Celecoxib) Rash    Current outpatient prescriptions:Aspirin Buff, Al Hyd-Mg Hyd, (ASCRIPTIN PO), Take by mouth as needed.  , Disp: , Rfl: ;  naproxen sodium (ANAPROX) 220 MG tablet, Take 220 mg by mouth as needed.  , Disp: , Rfl: ;  Omeprazole (PRILOSEC PO), Take by mouth daily.  , Disp: , Rfl: ;  Hydrocodone-Acetaminophen (VICODIN PO), Take by mouth as needed.  , Disp: , Rfl:   BP 150/90  Pulse 80  Ht 5\' 8"  (1.727 m)  Wt 158 lb 14.4 oz (72.077 kg)  BMI 24.16 kg/m2  SpO2 96%  Body mass index is 24.16 kg/(m^2).       Physical exam: Well-developed well-nourished white male in no acute distress. HEENT normal. Chest clear bilaterally. Heart regular rate and rhythm. Abdomen soft nontender with prominent aortic pulsation no tenderness over the aneurysm. 2+ femoral pulses bilaterally. Musculoskeletal no major deformities. Neurologic grossly intact.  Vascular lab: 4.8 cm abdominal aortic aneurysm area this is no change from a study 6 months ago.  Impression and plan stable 4.8 cm abdominal aortic aneurysm. Continued serial ultrasound followup to rule out  enlargement. I did discuss symptoms of aneurysm with the patient and family and he will present emergent emergently to the hospital should this occur

## 2011-03-14 NOTE — Procedures (Unsigned)
DUPLEX ULTRASOUND OF ABDOMINAL AORTA  INDICATION:  AAA  HISTORY: Diabetes:  No Cardiac:  No Hypertension:  No Smoking:  Previous Connective Tissue Disorder: Family History:  No Previous Surgery:  No  DUPLEX EXAM:         AP (cm)                   TRANSVERSE (cm) Proximal             2.34 cm                   2.27 cm Mid                  4.47 cm                   4.78 cm Distal               2.94 cm                   2.86 cm Right Iliac          1.12 cm                   1.12 cm Left Iliac           1.54 cm                   1.51 cm  PREVIOUS:  Date:  09/04/2010  AP:  4.8  TRANSVERSE:  4.9  IMPRESSION:  Abdominal aortic aneurysm noted today with largest measurement of 4.47 x 4.78 cm.  ___________________________________________ Larina Earthly, M.D.  EM/MEDQ  D:  03/12/2011  T:  03/12/2011  Job:  [id number]

## 2011-03-21 ENCOUNTER — Encounter: Payer: Self-pay | Admitting: Vascular Surgery

## 2011-03-21 DIAGNOSIS — I714 Abdominal aortic aneurysm, without rupture, unspecified: Secondary | ICD-10-CM | POA: Insufficient documentation

## 2011-08-04 ENCOUNTER — Emergency Department (HOSPITAL_COMMUNITY): Payer: Medicare Other

## 2011-08-04 ENCOUNTER — Emergency Department (HOSPITAL_COMMUNITY)
Admission: EM | Admit: 2011-08-04 | Discharge: 2011-08-04 | Disposition: A | Discharge status: Home / Self Care | Payer: Medicare Other | Attending: Emergency Medicine | Admitting: Emergency Medicine

## 2011-08-04 ENCOUNTER — Other Ambulatory Visit: Payer: Self-pay

## 2011-08-04 ENCOUNTER — Encounter (HOSPITAL_COMMUNITY): Payer: Self-pay | Admitting: Emergency Medicine

## 2011-08-04 DIAGNOSIS — K219 Gastro-esophageal reflux disease without esophagitis: Secondary | ICD-10-CM | POA: Diagnosis not present

## 2011-08-04 DIAGNOSIS — J3489 Other specified disorders of nose and nasal sinuses: Secondary | ICD-10-CM | POA: Diagnosis not present

## 2011-08-04 DIAGNOSIS — R059 Cough, unspecified: Secondary | ICD-10-CM | POA: Insufficient documentation

## 2011-08-04 DIAGNOSIS — R079 Chest pain, unspecified: Secondary | ICD-10-CM | POA: Diagnosis not present

## 2011-08-04 DIAGNOSIS — M129 Arthropathy, unspecified: Secondary | ICD-10-CM | POA: Diagnosis not present

## 2011-08-04 DIAGNOSIS — R0989 Other specified symptoms and signs involving the circulatory and respiratory systems: Secondary | ICD-10-CM | POA: Diagnosis not present

## 2011-08-04 DIAGNOSIS — R05 Cough: Secondary | ICD-10-CM

## 2011-08-04 DIAGNOSIS — R0602 Shortness of breath: Secondary | ICD-10-CM | POA: Diagnosis not present

## 2011-08-04 MED ORDER — ALBUTEROL SULFATE HFA 108 (90 BASE) MCG/ACT IN AERS
2.0000 | INHALATION_SPRAY | RESPIRATORY_TRACT | Status: DC | PRN
  Administered 2011-08-04: 2 via RESPIRATORY_TRACT
  Filled 2011-08-04: qty 6.7

## 2011-08-04 MED ORDER — LORATADINE 10 MG PO TABS: 10.0000 mg | ORAL_TABLET | Freq: Every day | ORAL | Status: AC

## 2011-08-04 NOTE — ED Notes (Signed)
Patient with c/o cough/congestion x 4 days. Denies fever, chills, nausea/vomiting. States he is coughing up "white stuff". Congested cough noted, non-productive at this time.

## 2011-08-04 NOTE — ED Provider Notes (Signed)
History   This chart was scribed for Shawn Gaskins, MD by Melba Coon. The patient was seen in room APA04/[facility]04 and the patient's care was started at 8:23AM.    CSN: [id number]  Arrival date & time 08/04/11  0810   First MD Initiated Contact with Patient 08/04/11 [phone]      Chief Complaint - cough   HPI Shawn Frye is a 78 y.o. male who presents to the Emergency Department complaining of persistent, moderate to severe productive coughing with an onset 3 days ago; production from cough has been white, no hemoptysis. Today, pt has been coughing for 1.5-2 hrs. Pt has been taking OTC cough meds but has not allevated the cough. Pt also c/o burning CP. Ambulation nml and no dyspnea on exertion. Rhinorrhea and SOB present. No HA, fever, chills, n/v/d, or abd pain. Hx of AAA. Pt does not smoke.   Past Medical History  Diagnosis Date  . Arthritis   . GERD (gastroesophageal reflux disease)   . Joint pain   . AAA (abdominal aortic aneurysm)     Past Surgical History  Procedure Date  . Total knee arthroplasty     bilateral  . Total shoulder replacement     partial  . Hernia repair   . Hemorroidectomy     No family history on file.  History  Substance Use Topics  . Smoking status: Former Smoker    Types: Cigarettes    Quit date: 05/13/1950  . Smokeless tobacco: Not on file  . Alcohol Use: No      Review of Systems 10 Systems reviewed and are negative for acute change except as noted in the HPI.  Allergies  Percocet and Celebrex  Home Medications   Current Outpatient Rx  Name Route Sig Dispense Refill  . ASCRIPTIN PO Oral Take by mouth as needed.      Marland Kitchen VICODIN PO Oral Take by mouth as needed.      Marland Kitchen NAPROXEN SODIUM 220 MG PO TABS Oral Take 220 mg by mouth as needed.      Marland Kitchen PRILOSEC PO Oral Take by mouth daily.        BP 138/83  Pulse 93  Temp(Src) 98.3 F (36.8 C) (Oral)  Resp 23  Ht 5\' 8"  (1.727 m)  Wt 160 lb (72.576 kg)  BMI 24.33 kg/m2  SpO2  96%  Physical Exam CONSTITUTIONAL: Well developed/well nourished HEAD AND FACE: Normocephalic/atraumatic EYES: EOMI/PERRL ENMT: Mucous membranes moist; nasal congestion present. NECK: supple no meningeal signs SPINE:entire spine nontender CV: S1/S2 noted, no murmurs/rubs/gallops noted LUNGS: Lungs are clear to auscultation bilaterally, no apparent distress ABDOMEN: soft, nontender, no rebound or guarding GU:no cva tenderness NEURO: Pt is awake/alert, moves all extremitiesx4 EXTREMITIES: pulses normal, full ROM SKIN: warm, color normal PSYCH: no abnormalities of mood noted  ED Course  Procedures   DIAGNOSTIC STUDIES: Oxygen Saturation is 93% on room air, low by my interpretation.    COORDINATION OF CARE:  8:29AM - EDMD will order CXR, EKG, and albuterol inhaler for the pt.  9:10 AM Pt improved I ambulated patient and he had no tachypnea, no hypoxia noted on repeat exam Discussed strict return precautions He thinks this may be allergy related, so I gave Rx for claritin Discussed need for PCP followup  *RADIOLOGY REPORT*  Clinical Data: Cough. Congestion. Shortness of breath.  CHEST - 2 VIEW  Comparison: 12/12/2004  Findings: Mild hyperinflation. Left shoulder arthroplasty. Midline trachea. Normal heart size. Tortuous thoracic aorta. No pleural  effusion or pneumothorax. Clear lungs.  Nonspecific interstitial thickening. Right glenohumeral joint osteoarthritis.  IMPRESSION: No acute cardiopulmonary disease.  Original Report Authenticated By: Consuello Bossier, M.D.     MDM  Nursing notes reviewed and considered in documentation xrays reviewed and considered    Date: 08/04/2011  Rate: 75  Rhythm: normal sinus rhythm  QRS Axis: left  Intervals: normal  ST/T Wave abnormalities: normal  Conduction Disutrbances:none  Narrative Interpretation:   Old EKG Reviewed: unchanged    I personally performed the services described in this documentation, which was  scribed in my presence. The recorded information has been reviewed and considered.           Shawn Gaskins, MD 08/04/11 [phone]

## 2011-08-04 NOTE — Discharge Instructions (Signed)

## 2011-08-06 DIAGNOSIS — N390 Urinary tract infection, site not specified: Secondary | ICD-10-CM | POA: Diagnosis not present

## 2011-09-10 ENCOUNTER — Encounter (INDEPENDENT_AMBULATORY_CARE_PROVIDER_SITE_OTHER): Payer: Medicare Other | Admitting: *Deleted

## 2011-09-10 DIAGNOSIS — I714 Abdominal aortic aneurysm, without rupture: Secondary | ICD-10-CM | POA: Diagnosis not present

## 2011-09-17 ENCOUNTER — Other Ambulatory Visit: Payer: Self-pay | Admitting: *Deleted

## 2011-09-17 DIAGNOSIS — I714 Abdominal aortic aneurysm, without rupture: Secondary | ICD-10-CM

## 2011-09-18 ENCOUNTER — Encounter: Payer: Self-pay | Admitting: Vascular Surgery

## 2011-09-18 NOTE — Procedures (Unsigned)
DUPLEX ULTRASOUND OF ABDOMINAL AORTA  INDICATION:  Follow up AAA.  HISTORY: Diabetes:  No. Cardiac:  No. Hypertension:  No. Smoking: Connective Tissue Disorder: Family History:  No. Previous Surgery:  No.  DUPLEX EXAM:         AP (cm)                   TRANSVERSE (cm) Proximal             2.32 cm                   2.35 cm Mid                  4.4 cm                    4.8 cm Distal               2.7 cm                    2.7 cm Right Iliac          1.73 cm                   1.73 cm Left Iliac           1.63 cm                   1.73 cm  PREVIOUS:  Date: 03/12/2011  AP:  4.5  TRANSVERSE:  4.8  IMPRESSION:  Stable abdominal aortic aneurysm measuring approximately 4.4 cm X 4.8 cm on today's examination.  ___________________________________________ Larina Earthly, M.D.  LT/MEDQ  D:  09/10/2011  T:  09/10/2011  Job:  [id number]

## 2011-09-24 DIAGNOSIS — Z0000 Encounter for general adult medical examination without abnormal findings: Secondary | ICD-10-CM | POA: Diagnosis not present

## 2011-09-24 DIAGNOSIS — M2550 Pain in unspecified joint: Secondary | ICD-10-CM | POA: Diagnosis not present

## 2011-09-24 DIAGNOSIS — I714 Abdominal aortic aneurysm, without rupture: Secondary | ICD-10-CM | POA: Diagnosis not present

## 2011-09-24 DIAGNOSIS — K219 Gastro-esophageal reflux disease without esophagitis: Secondary | ICD-10-CM | POA: Diagnosis not present

## 2011-09-24 DIAGNOSIS — Z13220 Encounter for screening for lipoid disorders: Secondary | ICD-10-CM | POA: Diagnosis not present

## 2011-09-24 DIAGNOSIS — M069 Rheumatoid arthritis, unspecified: Secondary | ICD-10-CM | POA: Diagnosis not present

## 2011-12-26 ENCOUNTER — Ambulatory Visit (INDEPENDENT_AMBULATORY_CARE_PROVIDER_SITE_OTHER): Payer: Medicare Other | Admitting: Otolaryngology

## 2011-12-26 DIAGNOSIS — H7200 Central perforation of tympanic membrane, unspecified ear: Secondary | ICD-10-CM | POA: Diagnosis not present

## 2011-12-26 DIAGNOSIS — H6980 Other specified disorders of Eustachian tube, unspecified ear: Secondary | ICD-10-CM | POA: Diagnosis not present

## 2012-02-04 DIAGNOSIS — H353190 Nonexudative age-related macular degeneration, unspecified eye, stage unspecified: Secondary | ICD-10-CM | POA: Diagnosis not present

## 2012-02-20 DIAGNOSIS — Z23 Encounter for immunization: Secondary | ICD-10-CM | POA: Diagnosis not present

## 2012-03-10 ENCOUNTER — Ambulatory Visit (INDEPENDENT_AMBULATORY_CARE_PROVIDER_SITE_OTHER): Payer: Medicare Other | Admitting: Urology

## 2012-03-10 DIAGNOSIS — N402 Nodular prostate without lower urinary tract symptoms: Secondary | ICD-10-CM

## 2012-03-10 DIAGNOSIS — R972 Elevated prostate specific antigen [PSA]: Secondary | ICD-10-CM

## 2012-03-10 DIAGNOSIS — N400 Enlarged prostate without lower urinary tract symptoms: Secondary | ICD-10-CM | POA: Diagnosis not present

## 2012-03-11 ENCOUNTER — Encounter: Payer: Self-pay | Admitting: Neurosurgery

## 2012-03-12 ENCOUNTER — Ambulatory Visit (INDEPENDENT_AMBULATORY_CARE_PROVIDER_SITE_OTHER): Payer: Medicare Other | Admitting: Neurosurgery

## 2012-03-12 ENCOUNTER — Encounter (INDEPENDENT_AMBULATORY_CARE_PROVIDER_SITE_OTHER): Payer: Medicare Other | Admitting: *Deleted

## 2012-03-12 ENCOUNTER — Encounter: Payer: Self-pay | Admitting: Neurosurgery

## 2012-03-12 VITALS — BP 134/79 | HR 67 | Resp 16 | Ht 67.0 in | Wt 156.5 lb

## 2012-03-12 DIAGNOSIS — I714 Abdominal aortic aneurysm, without rupture, unspecified: Secondary | ICD-10-CM

## 2012-03-12 NOTE — Addendum Note (Signed)
Addended by: Sharee Pimple on: 03/12/2012 12:14 PM   Modules accepted: Orders

## 2012-03-12 NOTE — Progress Notes (Signed)
[facility] [place] AAA/PAD/PVD Office Note  CC: AAA surveillance Referring Physician: Early  History of Present Illness: 78 year old male patient of Dr. Arbie Cookey is with known AAA. The patient denies any unusual abdominal or back pain. The patient denies any new medical diagnoses or recent surgery.  Past Medical History  Diagnosis Date  . Arthritis   . GERD (gastroesophageal reflux disease)   . Joint pain   . AAA (abdominal aortic aneurysm)     ROS: [x]  Positive   [ ]  Denies    General: [ ]  Weight loss, [ ]  Fever, [ ]  chills Neurologic: [ ]  Dizziness, [ ]  Blackouts, [ ]  Seizure [ ]  Stroke, [ ]  "Mini stroke", [ ]  Slurred speech, [ ]  Temporary blindness; [ ]  weakness in arms or legs, [ ]  Hoarseness Cardiac: [ ]  Chest pain/pressure, [ ]  Shortness of breath at rest [ ]  Shortness of breath with exertion, [ ]  Atrial fibrillation or irregular heartbeat Vascular: [ ]  Pain in legs with walking, [ ]  Pain in legs at rest, [ ]  Pain in legs at night,  [ ]  Non-healing ulcer, [ ]  Blood clot in vein/DVT,   Pulmonary: [ ]  Home oxygen, [ ]  Productive cough, [ ]  Coughing up blood, [ ]  Asthma,  [ ]  Wheezing Musculoskeletal:  [ ]  Arthritis, [ ]  Low back pain, [ ]  Joint pain Hematologic: [ ]  Easy Bruising, [ ]  Anemia; [ ]  Hepatitis Gastrointestinal: [ ]  Blood in stool, [ ]  Gastroesophageal Reflux/heartburn, [ ]  Trouble swallowing Urinary: [ ]  chronic Kidney disease, [ ]  on HD - [ ]  MWF or [ ]  TTHS, [ ]  Burning with urination, [ ]  Difficulty urinating Skin: [ ]  Rashes, [ ]  Wounds Psychological: [ ]  Anxiety, [ ]  Depression   Social History History  Substance Use Topics  . Smoking status: Former Smoker    Types: Cigarettes    Quit date: 05/13/1950  . Smokeless tobacco: Not on file  . Alcohol Use: No    Family History Family History  Problem Relation Age of Onset  . Heart disease Father     Allergies  Allergen Reactions  . Percocet (Oxycodone-Acetaminophen)   . Celebrex  (Celecoxib) Rash    Current Outpatient Prescriptions  Medication Sig Dispense Refill  . Multiple Vitamins-Minerals (ONE-A-DAY VITACRAVES PO) Take by mouth.      . Omeprazole (PRILOSEC PO) Take by mouth daily.        . Aspirin Buff, Al Hyd-Mg Hyd, (ASCRIPTIN PO) Take by mouth as needed.        . Hydrocodone-Acetaminophen (VICODIN PO) Take by mouth as needed.        . loratadine (CLARITIN) 10 MG tablet Take 1 tablet (10 mg total) by mouth daily.  14 tablet  0  . naproxen sodium (ANAPROX) 220 MG tablet Take 220 mg by mouth as needed.          Physical Examination  Filed Vitals:   03/12/12 0947  BP: 134/79  Pulse: 67  Resp: 16    Body mass index is 24.51 kg/(m^2).  General:  WDWN in NAD Gait: Normal HEENT: WNL Eyes: Pupils equal Pulmonary: normal non-labored breathing , without Rales, rhonchi,  wheezing Cardiac: RRR, without  Murmurs, rubs or gallops; No carotid bruits Abdomen: soft, NT, no masses Skin: no rashes, ulcers noted Vascular Exam/Pulses: Palpable femoral pulses bilaterally, 3+ radial pulses bilaterally, there is a pulsatile abdominal mass  Extremities without ischemic changes, no Gangrene , no cellulitis; no open wounds;  Musculoskeletal:  no muscle wasting or atrophy  Neurologic: A&O X 3; Appropriate Affect ; SENSATION: normal; MOTOR FUNCTION:  moving all extremities equally. Speech is fluent/normal  Non-Invasive Vascular Imaging: Maximum AAA diameter today is 4.6 x 4.9 which is virtually unchanged from previous exam in April 2013.  ASSESSMENT/PLAN: Asymptomatic AAA that will followup in 6 months with repeat AAA duplex. The patient's questions were encouraged and answered, he is in agreement with this plan.  Lauree Chandler ANP  Clinic M.D.: Early

## 2012-04-07 DIAGNOSIS — J069 Acute upper respiratory infection, unspecified: Secondary | ICD-10-CM | POA: Diagnosis not present

## 2012-04-22 DIAGNOSIS — D235 Other benign neoplasm of skin of trunk: Secondary | ICD-10-CM | POA: Diagnosis not present

## 2012-04-22 DIAGNOSIS — B353 Tinea pedis: Secondary | ICD-10-CM | POA: Diagnosis not present

## 2012-04-22 DIAGNOSIS — L84 Corns and callosities: Secondary | ICD-10-CM | POA: Diagnosis not present

## 2012-04-22 DIAGNOSIS — L821 Other seborrheic keratosis: Secondary | ICD-10-CM | POA: Diagnosis not present

## 2012-09-15 ENCOUNTER — Ambulatory Visit: Payer: Medicare Other | Admitting: Neurosurgery

## 2012-09-15 ENCOUNTER — Encounter (INDEPENDENT_AMBULATORY_CARE_PROVIDER_SITE_OTHER): Payer: Medicare Other | Admitting: *Deleted

## 2012-09-15 DIAGNOSIS — I714 Abdominal aortic aneurysm, without rupture: Secondary | ICD-10-CM

## 2012-10-14 DIAGNOSIS — H353190 Nonexudative age-related macular degeneration, unspecified eye, stage unspecified: Secondary | ICD-10-CM | POA: Diagnosis not present

## 2012-10-20 ENCOUNTER — Other Ambulatory Visit: Payer: Self-pay

## 2012-10-20 ENCOUNTER — Encounter: Payer: Self-pay | Admitting: Vascular Surgery

## 2012-10-20 ENCOUNTER — Telehealth: Payer: Self-pay

## 2012-10-20 DIAGNOSIS — I714 Abdominal aortic aneurysm, without rupture: Secondary | ICD-10-CM

## 2012-10-20 NOTE — Telephone Encounter (Signed)
Patient callled because he did not receive results letter and follow-up appt. information in the mail.  I gave the patient results of the ultrasound performed on 09-15-2012 and read by Dr. Arbie Cookey.  A letter with the results and a follow-up appointment time and day will be mailed to the patient.

## 2012-11-02 DIAGNOSIS — I714 Abdominal aortic aneurysm, without rupture: Secondary | ICD-10-CM | POA: Diagnosis not present

## 2012-11-02 DIAGNOSIS — Z13220 Encounter for screening for lipoid disorders: Secondary | ICD-10-CM | POA: Diagnosis not present

## 2012-11-02 DIAGNOSIS — R300 Dysuria: Secondary | ICD-10-CM | POA: Diagnosis not present

## 2012-11-02 DIAGNOSIS — N400 Enlarged prostate without lower urinary tract symptoms: Secondary | ICD-10-CM | POA: Diagnosis not present

## 2012-11-02 DIAGNOSIS — K219 Gastro-esophageal reflux disease without esophagitis: Secondary | ICD-10-CM | POA: Diagnosis not present

## 2012-12-24 ENCOUNTER — Ambulatory Visit (INDEPENDENT_AMBULATORY_CARE_PROVIDER_SITE_OTHER): Payer: Medicare Other | Admitting: Otolaryngology

## 2012-12-24 DIAGNOSIS — H903 Sensorineural hearing loss, bilateral: Secondary | ICD-10-CM | POA: Diagnosis not present

## 2012-12-24 DIAGNOSIS — H6980 Other specified disorders of Eustachian tube, unspecified ear: Secondary | ICD-10-CM

## 2013-02-16 ENCOUNTER — Ambulatory Visit (INDEPENDENT_AMBULATORY_CARE_PROVIDER_SITE_OTHER): Payer: Medicare Other | Admitting: Urology

## 2013-02-16 DIAGNOSIS — R972 Elevated prostate specific antigen [PSA]: Secondary | ICD-10-CM | POA: Diagnosis not present

## 2013-02-16 DIAGNOSIS — N400 Enlarged prostate without lower urinary tract symptoms: Secondary | ICD-10-CM | POA: Diagnosis not present

## 2013-02-16 DIAGNOSIS — R82998 Other abnormal findings in urine: Secondary | ICD-10-CM | POA: Diagnosis not present

## 2013-02-18 DIAGNOSIS — H3530 Unspecified macular degeneration: Secondary | ICD-10-CM | POA: Diagnosis not present

## 2013-02-19 DIAGNOSIS — Z23 Encounter for immunization: Secondary | ICD-10-CM | POA: Diagnosis not present

## 2013-04-27 ENCOUNTER — Other Ambulatory Visit: Payer: Medicare Other

## 2013-04-27 ENCOUNTER — Ambulatory Visit: Payer: Medicare Other | Admitting: Vascular Surgery

## 2013-05-03 ENCOUNTER — Encounter: Payer: Self-pay | Admitting: Family

## 2013-05-04 ENCOUNTER — Other Ambulatory Visit (HOSPITAL_COMMUNITY): Payer: Medicare Other

## 2013-05-04 ENCOUNTER — Encounter: Payer: Self-pay | Admitting: Family

## 2013-05-04 ENCOUNTER — Ambulatory Visit: Payer: Medicare Other | Admitting: Family

## 2013-05-04 ENCOUNTER — Ambulatory Visit (INDEPENDENT_AMBULATORY_CARE_PROVIDER_SITE_OTHER): Payer: Medicare Other | Admitting: Family

## 2013-05-04 ENCOUNTER — Ambulatory Visit (HOSPITAL_COMMUNITY)
Admission: RE | Admit: 2013-05-04 | Discharge: 2013-05-04 | Disposition: A | Discharge status: Home / Self Care | Payer: Medicare Other | Source: Ambulatory Visit | Attending: Family | Admitting: Family

## 2013-05-04 VITALS — BP 139/84 | HR 71 | Resp 14 | Ht 67.5 in | Wt 159.0 lb

## 2013-05-04 DIAGNOSIS — I714 Abdominal aortic aneurysm, without rupture, unspecified: Secondary | ICD-10-CM | POA: Insufficient documentation

## 2013-05-04 DIAGNOSIS — I739 Peripheral vascular disease, unspecified: Secondary | ICD-10-CM

## 2013-05-04 NOTE — Patient Instructions (Addendum)

## 2013-05-04 NOTE — Progress Notes (Signed)
[facility] [place] HISTORY AND PHYSICAL   MRN : [id number]  History of Present Illness:   Shawn Frye is a 79 y.o. male patient of Dr. Arbie Cookey with known AAA, returns today for routine surveillance. He denies any cardiac problems. He denies any claudication symptoms, denies non-healing wounds. He denies any history of stroke or TIA symptoms. Most of his surgeries were related to OA issues. He has mild back ache in the mornings which resolves with activity, he denies new back pain, denies abdominal pain. States he is very active physically, does not drink ETOH.  Pt Diabetic: No Pt smoker: non-smoker  Current Outpatient Prescriptions  Medication Sig Dispense Refill  . Multiple Vitamins-Minerals (ONE-A-DAY VITACRAVES PO) Take by mouth.      . naproxen sodium (ANAPROX) 220 MG tablet Take 220 mg by mouth as needed.        . Omeprazole (PRILOSEC PO) Take by mouth daily.        . Aspirin Buff, Al Hyd-Mg Hyd, (ASCRIPTIN PO) Take by mouth as needed.        . Hydrocodone-Acetaminophen (VICODIN PO) Take by mouth as needed.         No current facility-administered medications for this visit.    Pt meds include: Statin :No ASA: No Other anticoagulants/antiplatelets: no  Past Medical History  Diagnosis Date  . Arthritis   . GERD (gastroesophageal reflux disease)   . Joint pain   . AAA (abdominal aortic aneurysm)     Past Surgical History  Procedure Laterality Date  . Total knee arthroplasty      bilateral  . Total shoulder replacement      partial  . Hernia repair    . Hemorroidectomy    . Joint replacement Bilateral     Knee    Social History History  Substance Use Topics  . Smoking status: Former Smoker    Types: Cigarettes    Quit date: 05/13/1950  . Smokeless tobacco: Never Used  . Alcohol Use: No    Family History Family History  Problem Relation Age of Onset  . Heart disease Father   . Pneumonia Mother     Allergies  Allergen  Reactions  . Percocet [Oxycodone-Acetaminophen]   . Celebrex [Celecoxib] Rash     REVIEW OF SYSTEMS: See HPI for pertinent positives and negatives.  Physical Examination Filed Vitals:   05/04/13 1026  BP: 139/84  Pulse: 71  Resp: 14   Filed Weights   05/04/13 1026  Weight: 159 lb (72.122 kg)   Body mass index is 24.52 kg/(m^2).  General:  WDWN in NAD Gait: Normal HENT: WNL Eyes: Pupils equal Pulmonary: normal non-labored breathing , without Rales, rhonchi,  wheezing Cardiac: RRR, without  Murmurs, rubs or gallops; Abdomen: soft, NT, no masses Skin: no rashes, ulcers noted;  no Gangrene , no cellulitis; no open wounds;   Vascular Exam/Pulses: VASCULAR EXAM  Carotid Bruits Left Right   Negative Negative     Aorta is strongly palpable Radial pulses are 1+ and =                        VASCULAR EXAM: Extremities without ischemic changes  without Gangrene; without open wounds.  LE Pulses LEFT RIGHT       FEMORAL   palpable   palpable        POPLITEAL   palpable    palpable       POSTERIOR TIBIAL   palpable    palpable        DORSALIS PEDIS      ANTERIOR TIBIAL  palpable  palpable      Musculoskeletal: no muscle wasting or atrophy; no edema  Neurologic: A&O X 3; Appropriate Affect ;  SENSATION: normal; MOTOR FUNCTION: 4/5 Symmetric, CN 2-12 intact except for hard of hearing, wearing bilateral hearing aids. Speech is fluent/normal   Non-Invasive Vascular Imaging (05/04/2013):   ASSESSMENT: AAA Duplex: 5.2 x 5.2 cm; Right common iliac artery: 2.00 cm; Left CIA: 2.18 cm Previous (09/15/12) AAA Duplex: 4.9 x 4.9 cm. Palpable bilateral popliteal arteries. He is in good health for age almost 41 years. His risk factors for aneurysmal growth are not present or are addressed.  PLAN:   Based on today's exam and Duplex results, and after discussing with Dr.  Arbie Cookey, advised patient to return in 6 months for AAA Duplex and evaluate bilateral popliteal arteries by Duplex. I discussed in depth with the patient the nature of atherosclerosis, and emphasized the importance of maximal medical management including strict control of blood pressure, blood glucose, and lipid levels, obtaining regular exercise, and continued cessation of smoking.  The patient is aware that without maximal medical management the underlying atherosclerotic disease process will progress, limiting the benefit of any interventions.  The patient was given information about AAA including signs, symptoms, treatment,  what symptoms should prompt the patient to seek immediate medical care, and how to minimize the risk of enlargement and rupture of aneurysms.  Thank you for allowing [place] to participate in this patient's care.  Charisse March, RN, MSN, FNP-C [facility] Office: [phone]  Clinic MD: Early 05/04/2013 10:27 AM

## 2013-10-12 DIAGNOSIS — H353190 Nonexudative age-related macular degeneration, unspecified eye, stage unspecified: Secondary | ICD-10-CM | POA: Diagnosis not present

## 2013-10-26 DIAGNOSIS — J310 Chronic rhinitis: Secondary | ICD-10-CM | POA: Diagnosis not present

## 2013-10-26 DIAGNOSIS — H902 Conductive hearing loss, unspecified: Secondary | ICD-10-CM | POA: Diagnosis not present

## 2013-10-26 DIAGNOSIS — H6980 Other specified disorders of Eustachian tube, unspecified ear: Secondary | ICD-10-CM | POA: Diagnosis not present

## 2013-10-26 DIAGNOSIS — H6120 Impacted cerumen, unspecified ear: Secondary | ICD-10-CM | POA: Diagnosis not present

## 2013-10-26 DIAGNOSIS — J343 Hypertrophy of nasal turbinates: Secondary | ICD-10-CM | POA: Diagnosis not present

## 2013-10-26 DIAGNOSIS — H903 Sensorineural hearing loss, bilateral: Secondary | ICD-10-CM | POA: Diagnosis not present

## 2013-10-27 DIAGNOSIS — H902 Conductive hearing loss, unspecified: Secondary | ICD-10-CM | POA: Diagnosis not present

## 2013-10-27 DIAGNOSIS — H6980 Other specified disorders of Eustachian tube, unspecified ear: Secondary | ICD-10-CM | POA: Diagnosis not present

## 2013-10-27 DIAGNOSIS — H6520 Chronic serous otitis media, unspecified ear: Secondary | ICD-10-CM | POA: Diagnosis not present

## 2013-11-01 ENCOUNTER — Encounter: Payer: Self-pay | Admitting: Family

## 2013-11-02 ENCOUNTER — Encounter: Payer: Self-pay | Admitting: Family

## 2013-11-02 ENCOUNTER — Ambulatory Visit (HOSPITAL_COMMUNITY)
Admission: RE | Admit: 2013-11-02 | Discharge: 2013-11-02 | Disposition: A | Discharge status: Home / Self Care | Payer: Medicare Other | Source: Ambulatory Visit | Attending: Family | Admitting: Family

## 2013-11-02 ENCOUNTER — Ambulatory Visit (INDEPENDENT_AMBULATORY_CARE_PROVIDER_SITE_OTHER): Payer: Medicare Other | Admitting: Family

## 2013-11-02 VITALS — BP 137/84 | HR 65 | Resp 16 | Ht 67.5 in | Wt 157.0 lb

## 2013-11-02 DIAGNOSIS — I714 Abdominal aortic aneurysm, without rupture, unspecified: Secondary | ICD-10-CM | POA: Insufficient documentation

## 2013-11-02 DIAGNOSIS — I739 Peripheral vascular disease, unspecified: Secondary | ICD-10-CM | POA: Insufficient documentation

## 2013-11-02 DIAGNOSIS — R29898 Other symptoms and signs involving the musculoskeletal system: Secondary | ICD-10-CM

## 2013-11-02 NOTE — Patient Instructions (Signed)
Abdominal Aortic Aneurysm An aneurysm is a weakened or damaged part of an artery wall that bulges from the normal force of blood pumping through the body. An abdominal aortic aneurysm is an aneurysm that occurs in the lower part of the aorta, the main artery of the body.  The major concern with an abdominal aortic aneurysm is that it can enlarge and burst (rupture) or blood can flow between the layers of the wall of the aorta through a tear (aorticdissection). Both of these conditions can cause bleeding inside the body and can be life threatening unless diagnosed and treated promptly. CAUSES  The exact cause of an abdominal aortic aneurysm is unknown. Some contributing factors are:   A hardening of the arteries caused by the buildup of fat and other substances in the lining of a blood vessel (arteriosclerosis).  Inflammation of the walls of an artery (arteritis).   Connective tissue diseases, such as Marfan syndrome.   Abdominal trauma.   An infection, such as syphilis or staphylococcus, in the wall of the aorta (infectious aortitis) caused by bacteria. RISK FACTORS  Risk factors that contribute to an abdominal aortic aneurysm may include:  Age older than 60 years.   High blood pressure (hypertension).  Male gender.  Ethnicity (white race).  Obesity.  Family history of aneurysm (first degree relatives only).  Tobacco use. PREVENTION  The following healthy lifestyle habits may help decrease your risk of abdominal aortic aneurysm:  Quitting smoking. Smoking can raise your blood pressure and cause arteriosclerosis.  Limiting or avoiding alcohol.  Keeping your blood pressure, blood sugar level, and cholesterol levels within normal limits.  Decreasing your salt intake. In somepeople, too much salt can raise blood pressure and increase your risk of abdominal aortic aneurysm.  Eating a diet low in saturated fats and cholesterol.  Increasing your fiber intake by including  whole grains, vegetables, and fruits in your diet. Eating these foods may help lower blood pressure.  Maintaining a healthy weight.  Staying physically active and exercising regularly. SYMPTOMS  The symptoms of abdominal aortic aneurysm may vary depending on the size and rate of growth of the aneurysm.Most grow slowly and do not have any symptoms. When symptoms do occur, they may include:  Pain (abdomen, side, lower back, or groin). The pain may vary in intensity. A sudden onset of severe pain may indicate that the aneurysm has ruptured.  Feeling full after eating only small amounts of food.  Nausea or vomiting or both.  Feeling a pulsating lump in the abdomen.  Feeling faint or passing out. DIAGNOSIS  Since most unruptured abdominal aortic aneurysms have no symptoms, they are often discovered during diagnostic exams for other conditions. An aneurysm may be found during the following procedures:  Ultrasonography (A one-time screening for abdominal aortic aneurysm by ultrasonography is also recommended for all men aged 65-75 years who have ever smoked).  X-ray exams.  A computed tomography (CT).  Magnetic resonance imaging (MRI).  Angiography or arteriography. TREATMENT  Treatment of an abdominal aortic aneurysm depends on the size of your aneurysm, your age, and risk factors for rupture. Medication to control blood pressure and pain may be used to manage aneurysms smaller than 6 cm. Regular monitoring for enlargement may be recommended by your caregiver if:  The aneurysm is 3-4 cm in size (an annual ultrasonography may be recommended).  The aneurysm is 4-4.5 cm in size (an ultrasonography every 6 months may be recommended).  The aneurysm is larger than 4.5 cm in   size (your caregiver may ask that you be examined by a vascular surgeon). If your aneurysm is larger than 6 cm, surgical repair may be recommended. There are two main methods for repair of an aneurysm:   Endovascular  repair (a minimally invasive surgery). This is done most often.  Open repair. This method is used if an endovascular repair is not possible. Document Released: 02/06/2005 Document Revised: 08/24/2012 Document Reviewed: 05/29/2012 [employer] Patient Information 2015 [employer]. This information is not intended to replace advice given to you by your health care provider. Make sure you discuss any questions you have with your health care provider.  

## 2013-11-02 NOTE — Progress Notes (Signed)
[facility] [place]  Established Abdominal Aortic Aneurysm  History of Present Illness  Shawn Frye is a 80 y.o. ([birth year]) male patient of Dr. Donnetta Hutching with known AAA, returns today for routine surveillance.  He denies any cardiac problems.  He denies any claudication symptoms, denies non-healing wounds.  He denies any history of stroke or TIA symptoms.  Most of his surgeries were related to OA issues.  He has mild back ache in the mornings which resolves with activity, he denies new back pain, denies abdominal pain.  States he is very active physically, does not drink ETOH.   Pt Diabetic: No  Pt smoker: non-smoker  Pt meds include:  Statin :No, states his cholesterol is good ASA: No  Other anticoagulants/antiplatelets: no   Past Medical History  Diagnosis Date  . Arthritis   . GERD (gastroesophageal reflux disease)   . Joint pain   . AAA (abdominal aortic aneurysm)    Past Surgical History  Procedure Laterality Date  . Total knee arthroplasty      bilateral  . Total shoulder replacement      partial  . Hernia repair  1991  . Hemorroidectomy    . Joint replacement Bilateral 1998 and  2003    Knee  . Spine surgery  2005   Social History History   Social History  . Marital Status: Married    Spouse Name: N/A    Number of Children: N/A  . Years of Education: N/A   Occupational History  . Not on file.   Social History Main Topics  . Smoking status: Former Smoker    Types: Cigarettes    Quit date: 05/13/1950  . Smokeless tobacco: Never Used  . Alcohol Use: No  . Drug Use: No  . Sexual Activity: Not on file   Other Topics Concern  . Not on file   Social History Narrative  . No narrative on file   Family History Family History  Problem Relation Age of Onset  . Heart disease Father     NOT  before age 38  . Pneumonia Mother     Current Outpatient Prescriptions on File Prior to Visit  Medication Sig Dispense Refill  .  Multiple Vitamins-Minerals (ONE-A-DAY VITACRAVES PO) Take by mouth.      . naproxen sodium (ANAPROX) 220 MG tablet Take 220 mg by mouth as needed.        . Omeprazole (PRILOSEC PO) Take by mouth daily.        . Aspirin Buff, Al Hyd-Mg Hyd, (ASCRIPTIN PO) Take by mouth as needed.        . Hydrocodone-Acetaminophen (VICODIN PO) Take by mouth as needed.         No current facility-administered medications on file prior to visit.   Allergies  Allergen Reactions  . Percocet [Oxycodone-Acetaminophen] Hives  . Celebrex [Celecoxib] Rash    ROS: See HPI for pertinent positives and negatives.  Physical Examination  Filed Vitals:   11/02/13 1051  BP: 137/84  Pulse: 65  Resp: 16  Height: 5' 7.5" (1.715 m)  Weight: 157 lb (71.215 kg)  SpO2: 96%   Body mass index is 24.21 kg/(m^2).  General: WDWN in NAD  Gait: Normal  HENT: WNL  Eyes: Pupils equal  Pulmonary: normal non-labored breathing , without Rales, rhonchi, wheezing  Cardiac: RRR, without Murmurs, rubs or gallops;  Abdomen: soft, NT, no masses  Skin: no rashes, ulcers noted; no Gangrene , no cellulitis; no open wounds;  Vascular Exam/Pulses:  VASCULAR EXAM  Carotid Bruits  Left  Right    Negative  Negative   Aorta is strongly palpable  Radial pulses are 1+ and =   VASCULAR EXAM:  Extremities without ischemic changes  without Gangrene; without open wounds.   LE Pulses  LEFT  RIGHT   FEMORAL  3+palpable  3+palpable   POPLITEAL  3+palpable  3+palpable   POSTERIOR TIBIAL  1+palpable  1+palpable   DORSALIS PEDIS  ANTERIOR TIBIAL  2+palpable  2+palpable    Musculoskeletal: no muscle wasting or atrophy; no edema  Neurologic: A&O X 3; Appropriate Affect ;  SENSATION: normal;  MOTOR FUNCTION: 5/5 Symmetric, CN 2-12 intact except for hard of hearing, wearing bilateral hearing aids.  Speech is fluent/normal  Non-Invasive Vascular Imaging  AAA Duplex (11/02/2013) ABDOMINAL AORTA DUPLEX EVALUATION    INDICATION:  Evaluation of abdominal aorta.    PREVIOUS INTERVENTION(S):     DUPLEX EXAM:     LOCATION DIAMETER AP (cm) DIAMETER TRANSVERSE (cm) VELOCITIES (cm/sec)  Aorta Proximal 2.41 2.56 81  Aorta Mid 2.93 2.91 86  Aorta Distal 5.09 5.10 33  Right Common Iliac Artery 1.87 1.76 73  Left Common Iliac Artery 1.76 1.88 76    Previous max aortic diameter:  5.20 x 5.20 Date: 05/04/2013     ADDITIONAL FINDINGS:     IMPRESSION: Abdominal aortic aneurysm measuring approximately 5.09 x 5.10 cm in diameter.    Compared to the previous exam:  No significant change in comparison to the last exam on 05/04/2013.     Medical Decision Making  The patient is a 80 y.o. male who presents with asymptomatic AAA with no increase in size in the last 3 years, 4.9 cm in April, 2012. Popliteal arteries were not Duplexed today, will check in 6 months.   Based on this patient's exam and diagnostic studies, the patient will follow up in 6 months  with the following studies: AAA Duplex and bilateral LE arterial Duplex to evaluate popliteal pulses.  Consideration for repair of AAA would be made when the size is 5.5 cm, growth > 1 cm/yr, and symptomatic status.  I emphasized the importance of maximal medical management including strict control of blood pressure, blood glucose, and lipid levels, antiplatelet agents, obtaining regular exercise, and continued cessation of smoking.   The patient was given information about AAA including signs, symptoms, treatment, and how to minimize the risk of enlargement and rupture of aneurysms.    The patient was advised to call 911 should the patient experience sudden onset abdominal or back pain.   Thank you for allowing [place] to participate in this patient's care.  Clemon Chambers, RN, MSN, FNP-C [facility] [place] Office: [phone]  Clinic Physician: Early  11/02/2013, 10:56 AM

## 2013-11-02 NOTE — Addendum Note (Signed)
Addended by: MCCHESNEY, MARILYN K on: 11/02/2013 01:07 PM   Modules accepted: Orders  

## 2013-11-04 ENCOUNTER — Ambulatory Visit (INDEPENDENT_AMBULATORY_CARE_PROVIDER_SITE_OTHER): Payer: Medicare Other | Admitting: Otolaryngology

## 2013-11-04 DIAGNOSIS — H93299 Other abnormal auditory perceptions, unspecified ear: Secondary | ICD-10-CM | POA: Diagnosis not present

## 2013-11-04 DIAGNOSIS — H6980 Other specified disorders of Eustachian tube, unspecified ear: Secondary | ICD-10-CM

## 2013-11-25 ENCOUNTER — Ambulatory Visit (INDEPENDENT_AMBULATORY_CARE_PROVIDER_SITE_OTHER): Payer: Medicare Other | Admitting: Otolaryngology

## 2013-11-25 DIAGNOSIS — H903 Sensorineural hearing loss, bilateral: Secondary | ICD-10-CM

## 2013-11-25 DIAGNOSIS — H7200 Central perforation of tympanic membrane, unspecified ear: Secondary | ICD-10-CM

## 2013-11-25 DIAGNOSIS — H6980 Other specified disorders of Eustachian tube, unspecified ear: Secondary | ICD-10-CM | POA: Diagnosis not present

## 2013-12-14 ENCOUNTER — Ambulatory Visit (INDEPENDENT_AMBULATORY_CARE_PROVIDER_SITE_OTHER): Payer: Medicare Other | Admitting: Orthopedic Surgery

## 2013-12-14 ENCOUNTER — Encounter: Payer: Self-pay | Admitting: Orthopedic Surgery

## 2013-12-14 ENCOUNTER — Ambulatory Visit (INDEPENDENT_AMBULATORY_CARE_PROVIDER_SITE_OTHER): Payer: Medicare Other

## 2013-12-14 VITALS — BP 125/64 | Ht 67.0 in | Wt 156.4 lb

## 2013-12-14 DIAGNOSIS — M25519 Pain in unspecified shoulder: Secondary | ICD-10-CM | POA: Diagnosis not present

## 2013-12-14 DIAGNOSIS — M25511 Pain in right shoulder: Secondary | ICD-10-CM

## 2013-12-14 NOTE — Progress Notes (Signed)
Subjective:     Patient ID: Shawn Frye, male   DOB: 08-29-1918, 80 y.o.   MRN: [id number]  HPI Primary care physician Dr. W[facility] patient Shoulder pain  Chief Complaint  Patient presents with  . Shoulder Pain     right shoulder pain off and on x 1 year, unknown injury   80 year old male with a one-year history of pain in his right shoulder which is intermittent worse after activity associated with a dull ache and occasional tingling. He rates his pain 6/10. He denies any previous treatment such as injection therapy or surgery. He denies trauma.  Past Medical History  Diagnosis Date  . Arthritis   . GERD (gastroesophageal reflux disease)   . Joint pain   . AAA (abdominal aortic aneurysm)    The past, family history and social history have been reviewed and are recorded in the corresponding sections of epic    Review of Systems Review of systems has been recorded reviewed and signed and scanned into the chart     Objective:   Physical Exam BP 125/64  Ht 5\' 7"  (1.702 m)  Wt 156 lb 6.4 oz (70.943 kg)  BMI 24.49 kg/m2 This is a well-developed well-nourished male who is in very good condition for his age of 89. He is oriented x3 his mood is pleasant he walks without support  Right shoulder. He has limited external rotation 30 for elevation 70. Scapular substitution gives him another 20. Shoulder stable. He is crepitance on range of motion. His rotator cuff strength internal and Rotation are normal but for elevation he has weakness. Scans intact. He has some moles on his skin. Pulses are good sensation is normal he has no lymphadenopathy. He has good reflexes        Assessment:     The x-rays show complete loss of glenohumeral joint space consistent with glenohumeral arthritis  His pain is intermittent he may take an Aleve every now and then. His 89 he has an aneurysm in his aorta. I advised no surgical intervention    Plan:     No followup  necessary advised no surgery

## 2014-02-15 ENCOUNTER — Ambulatory Visit (INDEPENDENT_AMBULATORY_CARE_PROVIDER_SITE_OTHER): Payer: Medicare Other | Admitting: Urology

## 2014-02-15 DIAGNOSIS — R972 Elevated prostate specific antigen [PSA]: Secondary | ICD-10-CM

## 2014-02-15 DIAGNOSIS — N390 Urinary tract infection, site not specified: Secondary | ICD-10-CM

## 2014-02-15 DIAGNOSIS — N402 Nodular prostate without lower urinary tract symptoms: Secondary | ICD-10-CM | POA: Diagnosis not present

## 2014-02-15 DIAGNOSIS — N400 Enlarged prostate without lower urinary tract symptoms: Secondary | ICD-10-CM | POA: Diagnosis not present

## 2014-02-16 DIAGNOSIS — Z23 Encounter for immunization: Secondary | ICD-10-CM | POA: Diagnosis not present

## 2014-02-25 DIAGNOSIS — H903 Sensorineural hearing loss, bilateral: Secondary | ICD-10-CM | POA: Diagnosis not present

## 2014-02-25 DIAGNOSIS — H6691 Otitis media, unspecified, right ear: Secondary | ICD-10-CM | POA: Diagnosis not present

## 2014-05-10 ENCOUNTER — Other Ambulatory Visit (HOSPITAL_COMMUNITY): Payer: Medicare Other

## 2014-05-10 ENCOUNTER — Ambulatory Visit: Payer: Medicare Other | Admitting: Family

## 2014-05-18 ENCOUNTER — Encounter: Payer: Self-pay | Admitting: Family

## 2014-05-19 ENCOUNTER — Encounter: Payer: Self-pay | Admitting: Family

## 2014-05-19 ENCOUNTER — Ambulatory Visit (INDEPENDENT_AMBULATORY_CARE_PROVIDER_SITE_OTHER): Payer: Medicare Other | Admitting: Family

## 2014-05-19 ENCOUNTER — Ambulatory Visit (INDEPENDENT_AMBULATORY_CARE_PROVIDER_SITE_OTHER)
Admission: RE | Admit: 2014-05-19 | Discharge: 2014-05-19 | Disposition: A | Discharge status: Home / Self Care | Payer: Medicare Other | Source: Ambulatory Visit | Attending: Family | Admitting: Family

## 2014-05-19 ENCOUNTER — Ambulatory Visit (HOSPITAL_COMMUNITY)
Admission: RE | Admit: 2014-05-19 | Discharge: 2014-05-19 | Disposition: A | Discharge status: Home / Self Care | Payer: Medicare Other | Source: Ambulatory Visit | Attending: Family | Admitting: Family

## 2014-05-19 VITALS — BP 145/91 | HR 75 | Resp 16 | Ht 67.5 in | Wt 159.0 lb

## 2014-05-19 DIAGNOSIS — I714 Abdominal aortic aneurysm, without rupture, unspecified: Secondary | ICD-10-CM

## 2014-05-19 DIAGNOSIS — R0989 Other specified symptoms and signs involving the circulatory and respiratory systems: Secondary | ICD-10-CM | POA: Diagnosis not present

## 2014-05-19 DIAGNOSIS — I739 Peripheral vascular disease, unspecified: Secondary | ICD-10-CM

## 2014-05-19 NOTE — Progress Notes (Signed)
[facility] [place] HISTORY AND PHYSICAL   MRN : [id number]  History of Present Illness:   Shawn Frye is a 81 y.o. male  patient of Dr. Donnetta Hutching with known AAA, returns today for routine surveillance.  He denies any cardiac problems.  He denies any claudication symptoms, denies non-healing wounds.  He denies any history of stroke or TIA symptoms.  Most of his surgeries were related to OA issues.  He has mild back ache in the mornings which resolves with activity, he denies new back pain, denies abdominal pain.  States he is very active physically, does not drink ETOH.  Pt denies dizziness, denies dyspnea.  Pt Diabetic: No  Pt smoker: non-smoker  Pt meds include:  Statin :No, states his cholesterol is good ASA: No  Other anticoagulants/antiplatelets: no    Current Outpatient Prescriptions  Medication Sig Dispense Refill  . Aspirin Buff, Al Hyd-Mg Hyd, (ASCRIPTIN PO) Take by mouth as needed.      . Multiple Vitamins-Minerals (ONE-A-DAY VITACRAVES PO) Take by mouth.    . naproxen sodium (ANAPROX) 220 MG tablet Take 220 mg by mouth as needed.      . Omeprazole (PRILOSEC PO) Take by mouth daily.      . Hydrocodone-Acetaminophen (VICODIN PO) Take by mouth as needed.       No current facility-administered medications for this visit.    Past Medical History  Diagnosis Date  . Arthritis   . GERD (gastroesophageal reflux disease)   . Joint pain   . AAA (abdominal aortic aneurysm)     Social History History  Substance Use Topics  . Smoking status: Former Smoker    Types: Cigarettes    Quit date: 05/13/1950  . Smokeless tobacco: Never Used  . Alcohol Use: No    Family History Family History  Problem Relation Age of Onset  . Heart disease Father     NOT  before age 73  . Pneumonia Mother     Surgical History Past Surgical History  Procedure Laterality Date  . Total knee arthroplasty      bilateral  . Total shoulder replacement     partial  . Hernia repair  1991  . Hemorroidectomy    . Joint replacement Bilateral 1998 and  2003    Knee  . Spine surgery  2005    Allergies  Allergen Reactions  . Percocet [Oxycodone-Acetaminophen] Hives  . Noroxin [Norfloxacin]   . Celebrex [Celecoxib] Rash    Current Outpatient Prescriptions  Medication Sig Dispense Refill  . Aspirin Buff, Al Hyd-Mg Hyd, (ASCRIPTIN PO) Take by mouth as needed.      . Multiple Vitamins-Minerals (ONE-A-DAY VITACRAVES PO) Take by mouth.    . naproxen sodium (ANAPROX) 220 MG tablet Take 220 mg by mouth as needed.      . Omeprazole (PRILOSEC PO) Take by mouth daily.      . Hydrocodone-Acetaminophen (VICODIN PO) Take by mouth as needed.       No current facility-administered medications for this visit.     REVIEW OF SYSTEMS: See HPI for pertinent positives and negatives.  Physical Examination Filed Vitals:   05/19/14 0930  BP: 145/91  Pulse: 75  Resp: 16  Height: 5' 7.5" (1.715 m)  Weight: 159 lb (72.122 kg)  SpO2: 96%   Body mass index is 24.52 kg/(m^2).  General: WDWN in NAD  Gait: Normal  HENT: WNL  Eyes: Pupils equal  Pulmonary: normal non-labored breathing , without Rales, rhonchi, wheezing  Cardiac: RRR with occasional dropped beats, no appreciable murmurs Abdomen: soft, NT, no masses  Skin: no rashes, ulcers noted; no Gangrene , no cellulitis; no open wounds.  Vascular Exam/Pulses:  VASCULAR EXAM  Carotid Bruits  Left  Right    Negative  Negative   Aorta is strongly palpable  Radial pulses are 1+ and =   VASCULAR EXAM:  Extremities without ischemic changes  without Gangrene; without open wounds.   LE Pulses  LEFT  RIGHT   FEMORAL  2+palpable  2+palpable   POPLITEAL  3+palpable  3+palpable   POSTERIOR TIBIAL  Faintly palpable  Faintly palpable   DORSALIS PEDIS  ANTERIOR TIBIAL  Faintly palpable  Faintly palpable    Musculoskeletal: no muscle wasting or atrophy; trace  peripheral edema  Neurologic: A&O X 3; Appropriate Affect, SENSATION: normal;  MOTOR FUNCTION: 5/5 Symmetric, CN 2-12 intact except for hard of hearing, wearing bilateral hearing aids.  Speech is fluent/normal   Non-Invasive Vascular Imaging (05/19/2014):   ABDOMINAL AORTA DUPLEX EVALUATION    INDICATION: Follow-up abdominal aortic aneurysm     PREVIOUS INTERVENTION(S):     DUPLEX EXAM:     LOCATION DIAMETER AP (cm) DIAMETER TRANSVERSE (cm) VELOCITIES (cm/sec)  Aorta Proximal 2.41 2.39 62  Aorta Mid 5.00 5.01 88  Aorta Distal 2.63 2.61 47  Right Common Iliac Artery 1.59 1.54 74  Left Common Iliac Artery 1.63 1.67 101    Previous max aortic diameter:  5.09cm x 5.10cm Date: 11/02/5013     ADDITIONAL FINDINGS:     IMPRESSION: 1. Stable abdominal aortic aneurysm measuring 5.00cm x 5.01cm. 2. 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    Compared to the previous exam:  No significant change compared to prior exam.     LOWER EXTREMITY ARTERIAL DUPLEX EVALUATION      INDICATION: Full popliteal artery pulses    PREVIOUS INTERVENTION(S):     DUPLEX EXAM:     Popliteal Artery Diameter   AP (cm) Transverse (cm)  Right .79 .83  Left .85 .85     ADDITIONAL FINDINGS:     IMPRESSION: No evidence of aneurysm or stenosis of the bilateral popliteal artery.      ASSESSMENT:  Shawn Frye is a 81 y.o. male who presents with asymptomatic AAA with no increase in size since  April, 2012 at which time the AAA measured  4.9 cm (records reviewed). Prominent 3+ bilateral popliteal pulses, no popliteal aneurysms on today's LE arterial Duplex.  Pt states it has been over a year since he has seen his PCP, he has asymptomatic RRR with occasional dropped beats. Pt is advised to see his PCP re this.  PLAN:   Based on today's exam and non-invasive vascular lab results, the patient will follow up in 6 months with the following tests: AAA Duplex. I discussed in depth with  the patient the nature of atherosclerosis, and emphasized the importance of maximal medical management including strict control of blood pressure, blood glucose, and lipid levels, obtaining regular exercise, and cessation of smoking.  The patient is aware that without maximal medical management the underlying atherosclerotic disease process will progress, limiting the benefit of any interventions. Consideration for repair of AAA would be made when the size approaches 4.8 or 5.0 cm, growth > 1 cm/yr, and symptomatic status. The patient was given information about stroke prevention and what symptoms should prompt the patient to seek immediate medical care. The patient was given information about AAA including signs, symptoms, treatment,  what symptoms should prompt the patient to seek immediate medical care, and how to minimize the risk of enlargement and rupture of aneurysms. The patient was given information about PAD including signs, symptoms, treatment, what symptoms should prompt the patient to seek immediate medical care, and risk reduction measures to take. Thank you for allowing [place] to participate in this patient's care.  Clemon Chambers, RN, MSN, FNP-C [facility] Office: [phone]  Clinic MD: Early 05/19/2014 9:40 AM

## 2014-05-19 NOTE — Patient Instructions (Signed)
Abdominal Aortic Aneurysm An aneurysm is a weakened or damaged part of an artery wall that bulges from the normal force of blood pumping through the body. An abdominal aortic aneurysm is an aneurysm that occurs in the lower part of the aorta, the main artery of the body.  The major concern with an abdominal aortic aneurysm is that it can enlarge and burst (rupture) or blood can flow between the layers of the wall of the aorta through a tear (aorticdissection). Both of these conditions can cause bleeding inside the body and can be life threatening unless diagnosed and treated promptly. CAUSES  The exact cause of an abdominal aortic aneurysm is unknown. Some contributing factors are:   A hardening of the arteries caused by the buildup of fat and other substances in the lining of a blood vessel (arteriosclerosis).  Inflammation of the walls of an artery (arteritis).   Connective tissue diseases, such as Marfan syndrome.   Abdominal trauma.   An infection, such as syphilis or staphylococcus, in the wall of the aorta (infectious aortitis) caused by bacteria. RISK FACTORS  Risk factors that contribute to an abdominal aortic aneurysm may include:  Age older than 60 years.   High blood pressure (hypertension).  Male gender.  Ethnicity (white race).  Obesity.  Family history of aneurysm (first degree relatives only).  Tobacco use. PREVENTION  The following healthy lifestyle habits may help decrease your risk of abdominal aortic aneurysm:  Quitting smoking. Smoking can raise your blood pressure and cause arteriosclerosis.  Limiting or avoiding alcohol.  Keeping your blood pressure, blood sugar level, and cholesterol levels within normal limits.  Decreasing your salt intake. In somepeople, too much salt can raise blood pressure and increase your risk of abdominal aortic aneurysm.  Eating a diet low in saturated fats and cholesterol.  Increasing your fiber intake by including  whole grains, vegetables, and fruits in your diet. Eating these foods may help lower blood pressure.  Maintaining a healthy weight.  Staying physically active and exercising regularly. SYMPTOMS  The symptoms of abdominal aortic aneurysm may vary depending on the size and rate of growth of the aneurysm.Most grow slowly and do not have any symptoms. When symptoms do occur, they may include:  Pain (abdomen, side, lower back, or groin). The pain may vary in intensity. A sudden onset of severe pain may indicate that the aneurysm has ruptured.  Feeling full after eating only small amounts of food.  Nausea or vomiting or both.  Feeling a pulsating lump in the abdomen.  Feeling faint or passing out. DIAGNOSIS  Since most unruptured abdominal aortic aneurysms have no symptoms, they are often discovered during diagnostic exams for other conditions. An aneurysm may be found during the following procedures:  Ultrasonography (A one-time screening for abdominal aortic aneurysm by ultrasonography is also recommended for all men aged 65-75 years who have ever smoked).  X-ray exams.  A computed tomography (CT).  Magnetic resonance imaging (MRI).  Angiography or arteriography. TREATMENT  Treatment of an abdominal aortic aneurysm depends on the size of your aneurysm, your age, and risk factors for rupture. Medication to control blood pressure and pain may be used to manage aneurysms smaller than 6 cm. Regular monitoring for enlargement may be recommended by your caregiver if:  The aneurysm is 3-4 cm in size (an annual ultrasonography may be recommended).  The aneurysm is 4-4.5 cm in size (an ultrasonography every 6 months may be recommended).  The aneurysm is larger than 4.5 cm in   size (your caregiver may ask that you be examined by a vascular surgeon). If your aneurysm is larger than 6 cm, surgical repair may be recommended. There are two main methods for repair of an aneurysm:   Endovascular  repair (a minimally invasive surgery). This is done most often.  Open repair. This method is used if an endovascular repair is not possible. Document Released: 02/06/2005 Document Revised: 08/24/2012 Document Reviewed: 05/29/2012 [employer] Patient Information 2015 [employer]. This information is not intended to replace advice given to you by your health care provider. Make sure you discuss any questions you have with your health care provider.  

## 2014-05-19 NOTE — Addendum Note (Signed)
Addended by: Mena Goes on: 05/19/2014 02:57 PM   Modules accepted: Orders

## 2014-05-26 ENCOUNTER — Ambulatory Visit (INDEPENDENT_AMBULATORY_CARE_PROVIDER_SITE_OTHER): Payer: Medicare Other | Admitting: Otolaryngology

## 2014-05-26 DIAGNOSIS — H903 Sensorineural hearing loss, bilateral: Secondary | ICD-10-CM | POA: Diagnosis not present

## 2014-05-26 DIAGNOSIS — H9041 Sensorineural hearing loss, unilateral, right ear, with unrestricted hearing on the contralateral side: Secondary | ICD-10-CM | POA: Diagnosis not present

## 2014-09-13 ENCOUNTER — Ambulatory Visit (INDEPENDENT_AMBULATORY_CARE_PROVIDER_SITE_OTHER): Payer: Medicare Other | Admitting: Urology

## 2014-09-13 DIAGNOSIS — N400 Enlarged prostate without lower urinary tract symptoms: Secondary | ICD-10-CM | POA: Diagnosis not present

## 2014-09-13 DIAGNOSIS — N401 Enlarged prostate with lower urinary tract symptoms: Secondary | ICD-10-CM

## 2014-09-13 DIAGNOSIS — N390 Urinary tract infection, site not specified: Secondary | ICD-10-CM | POA: Diagnosis not present

## 2014-11-11 ENCOUNTER — Encounter: Payer: Self-pay | Admitting: Family

## 2014-11-17 ENCOUNTER — Encounter: Payer: Self-pay | Admitting: Family

## 2014-11-17 ENCOUNTER — Ambulatory Visit (INDEPENDENT_AMBULATORY_CARE_PROVIDER_SITE_OTHER): Payer: Medicare Other | Admitting: Family

## 2014-11-17 ENCOUNTER — Ambulatory Visit (HOSPITAL_COMMUNITY)
Admission: RE | Admit: 2014-11-17 | Discharge: 2014-11-17 | Disposition: A | Discharge status: Home / Self Care | Payer: Medicare Other | Source: Ambulatory Visit | Attending: Family | Admitting: Family

## 2014-11-17 VITALS — BP 141/78 | HR 63 | Resp 16 | Ht 67.5 in | Wt 154.0 lb

## 2014-11-17 DIAGNOSIS — I714 Abdominal aortic aneurysm, without rupture, unspecified: Secondary | ICD-10-CM

## 2014-11-17 NOTE — Progress Notes (Signed)
[facility] [place]  Established Abdominal Aortic Aneurysm  History of Present Illness  Shawn Frye is a 81 y.o. (June 26, 1918) male patient of Dr. Donnetta Hutching with known AAA, returns today for routine surveillance.  He denies any cardiac problems.  He denies any claudication symptoms, denies non-healing wounds.  He denies any history of stroke or TIA symptoms.  Most of his surgeries were related to OA issues.  He has mild back ache in the mornings which resolves with activity, he denies new back pain, denies abdominal pain.  States he is very active physically, does not drink ETOH.  Pt denies dizziness, denies dyspnea.  Pt Diabetic: No  Pt smoker: non-smoker  Pt meds include:  Statin :No, states his cholesterol is good ASA: No  Other anticoagulants/antiplatelets: no   Past Medical History  Diagnosis Date  . Arthritis   . GERD (gastroesophageal reflux disease)   . Joint pain   . AAA (abdominal aortic aneurysm)    Past Surgical History  Procedure Laterality Date  . Total knee arthroplasty      bilateral  . Total shoulder replacement      partial  . Hernia repair  1991  . Hemorroidectomy    . Joint replacement Bilateral 1998 and  2003    Knee  . Spine surgery  2005   Social History History   Social History  . Marital Status: Married    Spouse Name: N/A  . Number of Children: N/A  . Years of Education: N/A   Occupational History  . Not on file.   Social History Main Topics  . Smoking status: Former Smoker    Types: Cigarettes    Quit date: 05/13/1950  . Smokeless tobacco: Never Used  . Alcohol Use: No  . Drug Use: No  . Sexual Activity: Not on file   Other Topics Concern  . Not on file   Social History Narrative   Family History Family History  Problem Relation Age of Onset  . Heart disease Father     NOT  before age 59  . Pneumonia Mother     Current Outpatient Prescriptions on File Prior to Visit  Medication Sig  Dispense Refill  . Aspirin Buff, Al Hyd-Mg Hyd, (ASCRIPTIN PO) Take by mouth as needed.      . Hydrocodone-Acetaminophen (VICODIN PO) Take by mouth as needed.      . Multiple Vitamins-Minerals (ONE-A-DAY VITACRAVES PO) Take by mouth.    . naproxen sodium (ANAPROX) 220 MG tablet Take 220 mg by mouth as needed.      . Omeprazole (PRILOSEC PO) Take by mouth daily.       No current facility-administered medications on file prior to visit.   Allergies  Allergen Reactions  . Percocet [Oxycodone-Acetaminophen] Hives  . Noroxin [Norfloxacin]   . Celebrex [Celecoxib] Rash    ROS: See HPI for pertinent positives and negatives.  Physical Examination  Filed Vitals:   11/17/14 1001  BP: 141/78  Pulse: 63  Resp: 16  Height: 5' 7.5" (1.715 m)  Weight: 154 lb (69.854 kg)  SpO2: 97%   Body mass index is 23.75 kg/(m^2).  General: WDWN in NAD  Gait: Normal  HENT: WNL  Eyes: Pupils equal  Pulmonary: normal non-labored breathing , without rales, rhonchi, or wheezing  Cardiac: RRR with occasional dropped beats, no appreciable murmurs Abdomen: soft, NT, no palpable masses  Skin: no rashes, no ulcers; no Gangrene , no cellulitis; no open wounds.  Vascular Exam/Pulses:  VASCULAR EXAM  Carotid Bruits  Left  Right    Negative  Negative   Aorta is strongly palpable  Radial pulses are 2+ and =   VASCULAR EXAM:  Extremities without ischemic changes  without Gangrene; without open wounds.   LE Pulses  LEFT  RIGHT   FEMORAL  2+palpable  2+palpable   POPLITEAL  3+palpable  3+palpable   POSTERIOR TIBIAL  not palpable  not palpable   DORSALIS PEDIS  ANTERIOR TIBIAL  Faintly palpable  1+ palpable    Musculoskeletal: no muscle wasting or atrophy; trace peripheral edema   Neurologic: A&O X 3; Appropriate Affect, MOTOR FUNCTION: 5/5 Symmetric, CN 2-12 intact except for hard of hearing, wearing bilateral hearing aids.  Speech is  fluent/normal          Non-Invasive Vascular Imaging  AAA Duplex (11/17/2014) ABDOMINAL AORTA DUPLEX EVALUATION    INDICATION: Follow up aortic aneurysm    PREVIOUS INTERVENTION(S): None    DUPLEX EXAM:     LOCATION DIAMETER AP (cm) DIAMETER TRANSVERSE (cm) VELOCITIES (cm/sec)  Aorta Proximal 2.9 2.6 46  Aorta Mid 5.0 5.0 31  Aorta Distal 2.9 2.8 50  Right Common Iliac Artery 1.9 1.6 52  Left Common Iliac Artery 1.6 1.5 55    Previous max aortic diameter:  5.0 x 5.0 Date: 05/19/2014  ADDITIONAL FINDINGS:     IMPRESSION: 1. 5.0 x 5.0 abdominal aortic aneurysm    Compared to the previous exam:  Increase of size of common iliac arteries.     Medical Decision Making  The patient is a 81 y.o. male who presents with asymptomatic AAA with no increase in size since April, 2012 at which time the AAA measured 4.9 cm (records reviewed).  His chief complaint is right shoulder pain that is consistent with rotator cuff issues. 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 He states he will ask his PCP about physical therapy to help his right shoulder and about his occassional dropped heart beats. He admits that he has not seen his PCP in over a year.   Based on this patient's exam and diagnostic studies, the patient will follow up in 6 months  with the following studies: AAA Duplex.  I discussed with Dr. Donnetta Hutching the fact that his AAA has remained stable in size over the last 4 years. Due to pt's age, asymptomatic status, and stable size of AAA over years,         CTA abd/pelvis is not advised. Pt was given the option of yearly surveillance or every 6 months, he chose every 6 months.  Consideration for repair of AAA would be made when the size is 5.5 cm, growth > 1 cm/yr, and symptomatic status.  I emphasized the importance of maximal medical management including strict control of blood pressure, blood glucose, and lipid levels, antiplatelet agents, obtaining regular exercise, and continued cessation of  smoking.   The patient was given information about AAA including signs, symptoms, treatment, and how to minimize the risk of enlargement and rupture of aneurysms.    The patient was advised to call 911 should the patient experience sudden onset abdominal or back pain.   Thank you for allowing [place] to participate in this patient's care.  Clemon Chambers, RN, MSN, FNP-C [facility] [place] Office: [facility] Physician: Oneida Alar  11/17/2014, 10:05 AM

## 2014-11-17 NOTE — Patient Instructions (Signed)
Abdominal Aortic Aneurysm An aneurysm is a weakened or damaged part of an artery wall that bulges from the normal force of blood pumping through the body. An abdominal aortic aneurysm is an aneurysm that occurs in the lower part of the aorta, the main artery of the body.  The major concern with an abdominal aortic aneurysm is that it can enlarge and burst (rupture) or blood can flow between the layers of the wall of the aorta through a tear (aorticdissection). Both of these conditions can cause bleeding inside the body and can be life threatening unless diagnosed and treated promptly. CAUSES  The exact cause of an abdominal aortic aneurysm is unknown. Some contributing factors are:   A hardening of the arteries caused by the buildup of fat and other substances in the lining of a blood vessel (arteriosclerosis).  Inflammation of the walls of an artery (arteritis).   Connective tissue diseases, such as Marfan syndrome.   Abdominal trauma.   An infection, such as syphilis or staphylococcus, in the wall of the aorta (infectious aortitis) caused by bacteria. RISK FACTORS  Risk factors that contribute to an abdominal aortic aneurysm may include:  Age older than 60 years.   High blood pressure (hypertension).  Male gender.  Ethnicity (white race).  Obesity.  Family history of aneurysm (first degree relatives only).  Tobacco use. PREVENTION  The following healthy lifestyle habits may help decrease your risk of abdominal aortic aneurysm:  Quitting smoking. Smoking can raise your blood pressure and cause arteriosclerosis.  Limiting or avoiding alcohol.  Keeping your blood pressure, blood sugar level, and cholesterol levels within normal limits.  Decreasing your salt intake. In somepeople, too much salt can raise blood pressure and increase your risk of abdominal aortic aneurysm.  Eating a diet low in saturated fats and cholesterol.  Increasing your fiber intake by including  whole grains, vegetables, and fruits in your diet. Eating these foods may help lower blood pressure.  Maintaining a healthy weight.  Staying physically active and exercising regularly. SYMPTOMS  The symptoms of abdominal aortic aneurysm may vary depending on the size and rate of growth of the aneurysm.Most grow slowly and do not have any symptoms. When symptoms do occur, they may include:  Pain (abdomen, side, lower back, or groin). The pain may vary in intensity. A sudden onset of severe pain may indicate that the aneurysm has ruptured.  Feeling full after eating only small amounts of food.  Nausea or vomiting or both.  Feeling a pulsating lump in the abdomen.  Feeling faint or passing out. DIAGNOSIS  Since most unruptured abdominal aortic aneurysms have no symptoms, they are often discovered during diagnostic exams for other conditions. An aneurysm may be found during the following procedures:  Ultrasonography (A one-time screening for abdominal aortic aneurysm by ultrasonography is also recommended for all men aged 65-75 years who have ever smoked).  X-ray exams.  A computed tomography (CT).  Magnetic resonance imaging (MRI).  Angiography or arteriography. TREATMENT  Treatment of an abdominal aortic aneurysm depends on the size of your aneurysm, your age, and risk factors for rupture. Medication to control blood pressure and pain may be used to manage aneurysms smaller than 6 cm. Regular monitoring for enlargement may be recommended by your caregiver if:  The aneurysm is 3-4 cm in size (an annual ultrasonography may be recommended).  The aneurysm is 4-4.5 cm in size (an ultrasonography every 6 months may be recommended).  The aneurysm is larger than 4.5 cm in   size (your caregiver may ask that you be examined by a vascular surgeon). If your aneurysm is larger than 6 cm, surgical repair may be recommended. There are two main methods for repair of an aneurysm:   Endovascular  repair (a minimally invasive surgery). This is done most often.  Open repair. This method is used if an endovascular repair is not possible. Document Released: 02/06/2005 Document Revised: 08/24/2012 Document Reviewed: 05/29/2012 [employer] Patient Information 2015 [employer]. This information is not intended to replace advice given to you by your health care provider. Make sure you discuss any questions you have with your health care provider.  

## 2014-11-24 ENCOUNTER — Ambulatory Visit (INDEPENDENT_AMBULATORY_CARE_PROVIDER_SITE_OTHER): Payer: Medicare Other | Admitting: Otolaryngology

## 2014-11-24 DIAGNOSIS — H7201 Central perforation of tympanic membrane, right ear: Secondary | ICD-10-CM | POA: Diagnosis not present

## 2014-11-24 DIAGNOSIS — H6983 Other specified disorders of Eustachian tube, bilateral: Secondary | ICD-10-CM | POA: Diagnosis not present

## 2014-11-24 DIAGNOSIS — H903 Sensorineural hearing loss, bilateral: Secondary | ICD-10-CM | POA: Diagnosis not present

## 2014-11-29 DIAGNOSIS — N401 Enlarged prostate with lower urinary tract symptoms: Secondary | ICD-10-CM | POA: Diagnosis not present

## 2014-11-29 DIAGNOSIS — Z13220 Encounter for screening for lipoid disorders: Secondary | ICD-10-CM | POA: Diagnosis not present

## 2014-11-29 DIAGNOSIS — Z79899 Other long term (current) drug therapy: Secondary | ICD-10-CM | POA: Diagnosis not present

## 2014-11-30 DIAGNOSIS — M19011 Primary osteoarthritis, right shoulder: Secondary | ICD-10-CM | POA: Diagnosis not present

## 2014-12-02 DIAGNOSIS — N400 Enlarged prostate without lower urinary tract symptoms: Secondary | ICD-10-CM | POA: Diagnosis not present

## 2014-12-02 DIAGNOSIS — I714 Abdominal aortic aneurysm, without rupture: Secondary | ICD-10-CM | POA: Diagnosis not present

## 2014-12-02 DIAGNOSIS — D649 Anemia, unspecified: Secondary | ICD-10-CM | POA: Diagnosis not present

## 2014-12-02 DIAGNOSIS — K219 Gastro-esophageal reflux disease without esophagitis: Secondary | ICD-10-CM | POA: Diagnosis not present

## 2014-12-07 ENCOUNTER — Ambulatory Visit (HOSPITAL_COMMUNITY): Payer: Medicare Other | Attending: Orthopaedic Surgery

## 2014-12-07 ENCOUNTER — Encounter (HOSPITAL_COMMUNITY): Payer: Self-pay

## 2014-12-07 DIAGNOSIS — M19011 Primary osteoarthritis, right shoulder: Secondary | ICD-10-CM | POA: Diagnosis not present

## 2014-12-07 DIAGNOSIS — R29898 Other symptoms and signs involving the musculoskeletal system: Secondary | ICD-10-CM

## 2014-12-07 DIAGNOSIS — M25611 Stiffness of right shoulder, not elsewhere classified: Secondary | ICD-10-CM | POA: Diagnosis not present

## 2014-12-07 NOTE — Patient Instructions (Addendum)
1) Seated Row   Sit up straight with elbows by your sides. Pull back with shoulders/elbows, keeping forearms straight, as if pulling back on the reins of a horse. Squeeze shoulder blades together. Repeat 10___times, ____sets/day    2) Shoulder Elevation    Sit up straight with arms by your sides. Slowly bring your shoulders up towards your ears. Repeat_10__times, ____ sets/day    3) Shoulder Extension    Sit up straight with both arms by your side, draw your arms back behind your waist. Keep your elbows straight. Repeat _10___times, ____sets/day.  Perform each exercise ___10_____ reps. 2-3x days.   Protraction - LAYING DOWN  Start by holding a wand or cane at chest height.  Next, slowly push the wand outwards in front of your body so that your elbows become fully straightened. Then, return to the original position.     Shoulder FLEXION - LAYING DOWN - PALMS DOWN  In the standing position, hold a wand/cane with both arms, palms up on both sides. Raise up the wand/cane allowing your unaffected arm to perform most of the effort. Your affected arm should be partially relaxed.      Internal/External ROTATION - LAYING DOWN  While laying down, hold a wand/cane with both hands keeping your elbows bent. Hold the cane underhanded with your right arm. Move your arms and wand/cane to the right side.  Your affected arm should be partially relaxed while your unaffected arm performs most of the effort.     *Only worry about stretching out to the right side.*   Shoulder ABDUCTION - LAYING DOWN  While holding a wand/cane palm face up on the injured side and palm face down on the uninjured side, slowly raise up your injured arm to the side.   *SHOVEL MOTION OUT TO THE RIGHT SIDE*     Horizontal Abduction/Adduction - LAYING DOWN      Straight arms holding cane at shoulder height, bring cane to right, center, left. Repeat starting to left.   Copyright  V[employer]. All rights  reserved.   Completed each exercise 1-3 minutes. 2-3 times a day.  SHOULDER: Flexion On Table   Place hands on table, elbows straight. Move hips away from body. Press hands down into table.  Abduction (Passive)   With arm out to side, resting on table, lower head toward arm, keeping trunk away from table.   Copyright  V[employer]. All rights reserved.     Internal Rotation (Assistive)   Seated with elbow bent at right angle and held against side, slide arm on table surface in an inward arc.   Activity: Use this motion to brush crumbs off the table.  Copyright  VHI. All rights reserved.

## 2014-12-07 NOTE — Therapy (Signed)
[name], [place], [address] Phone: [phone]   Fax:  [phone]  Occupational Therapy Evaluation  Patient Details  Name: Shawn Frye MRN: [id number] Date of Birth: January 11, 1919 Referring Provider:  Garald Balding, MD  Encounter Date: 12/07/2014      OT End of Session - 12/07/14 1540    Visit Number 1   Number of Visits 1   Authorization Type Medicare Part A   Authorization Time Period before 10th visit   Authorization - Visit Number 1   Authorization - Number of Visits 10   OT Start Time 1430   OT Stop Time 1515   OT Time Calculation (min) 45 min   Activity Tolerance Patient tolerated treatment well   Behavior During Therapy [facility] for tasks assessed/performed      Past Medical History  Diagnosis Date  . Arthritis   . GERD (gastroesophageal reflux disease)   . Joint pain   . AAA (abdominal aortic aneurysm)     Past Surgical History  Procedure Laterality Date  . Total knee arthroplasty      bilateral  . Total shoulder replacement Left 1999    partial  . Hernia repair  1991  . Hemorroidectomy    . Joint replacement Bilateral 1998 and  2003    Knee  . Spine surgery  2005    There were no vitals filed for this visit.  Visit Diagnosis:  Primary osteoarthritis of right shoulder - Plan: Ot plan of care cert/re-cert  Shoulder stiffness, right - Plan: Ot plan of care cert/re-cert  Shoulder weakness - Plan: Ot plan of care cert/re-cert      Subjective Assessment - 12/07/14 1530    Subjective  S: I haven't been able to raise my arm up in a long time.    Patient is accompained by: Family member   Pertinent History Patient is a 81 y/o male presenting to occupational therapy today with right shoulder osteoarhritis which occured approx. 1 year ago. Pt reports that he was lifting too heavy and caused injury to his shoulder. Pt did receive a corizone shot which improved his pain level. Dr. Durward Fortes  has referred patient to occupational therapy for evaluation and discharge with HEP.   Limitations Raising his arm up above shoulder height.   Patient Stated Goals To be educated on exercises to complete at home.   Currently in Pain? No/denies  Pt reports that he has pain only with movement.           [facility] OT Assessment - 12/07/14 1533    Assessment   Diagnosis right shoulder osteoarthritis   Onset Date --  1 year ago   Prior Therapy None to right shoulder   Precautions   Precautions None   Restrictions   Weight Bearing Restrictions No   Balance Screen   Has the patient fallen in the past 6 months No   Home  Environment   Family/patient expects to be discharged to: Private residence   Living Arrangements Spouse/significant other   Prior Function   Level of Independence Independent;Independent with gait   Vocation Retired   Leisure Enjoys gardening. Takes care of his wife who has hx of a CVA.   ADL   ADL comments Difficulty doing any type of activity using his right arm such as reaching above shoulder height and lifting heavy items.   Mobility   Mobility Status Independent   Written Expression   Dominant Hand Left  Vision - History   Baseline Vision No visual deficits   Cognition   Overall Cognitive Status Within Functional Limits for tasks assessed   ROM / Strength   AROM / PROM / Strength AROM;Strength   AROM   Overall AROM  Deficits   Overall AROM Comments Shoulder flexion and abduction completed to 40-45% range. Shoulder IR: Full range. Shoulder ER: 50% of range.   AROM Assessment Site Shoulder   Right/Left Shoulder Right   Strength   Overall Strength Deficits   Overall Strength Comments Assessed seated. IR/ER adducted   Strength Assessment Site Shoulder   Right/Left Shoulder Right   Right Shoulder Flexion 3-/5   Right Shoulder ABduction 3-/5   Right Shoulder Internal Rotation 3/5   Right Shoulder External Rotation 3/5                  OT  Treatments/Exercises (OP) - 12/17/2014 1538    Exercises   Exercises Shoulder   Shoulder Exercises: Supine   Protraction AAROM;10 reps   Horizontal ABduction AAROM;10 reps   External Rotation AAROM;10 reps   Internal Rotation AAROM;10 reps   Flexion AAROM;10 reps   ABduction AAROM;10 reps   Shoulder Exercises: Seated   Elevation AROM;10 reps   Extension AROM;10 reps   Row AROM;10 reps   Shoulder Exercises: Stretch   Wall Stretch - Flexion 60 seconds               OT Education - 12-17-2014 1539    Education provided Yes   Education Details Overview of OA of shoulder. Table slides, AAROM exercises, AROM shoulder scapular exercises   Person(s) Educated Patient;Spouse   Methods Explanation;Demonstration;Handout   Comprehension Verbalized understanding;Returned demonstration          OT Short Term Goals - Dec 17, 2014 1543    OT SHORT TERM GOAL #1   Title Patient will be educated and indepedent with HEP   Time 1   Period Days   Status Achieved                  Plan - December 17, 2014 1541    Clinical Impression Statement A: Pt is a 81 y/o male s/p right shoulder OA causing increase pain with movement and decreased strength and ROM resulting in difficulty completing daily activities using RUE as nondominant. Dr. Durward Fortes requested that patient be evaled by OT and given a HEP to complete independently at home. Therapist reveiwed each exercise with patient and all questions were answered.    Pt will benefit from skilled therapeutic intervention in order to improve on the following deficits (Retired) Decreased range of motion;Decreased strength   Rehab Potential Excellent   OT Frequency 1x / week   OT Duration --  1 week   OT Treatment/Interventions Patient/family education;Therapeutic exercise   Plan P: 1 time eval with HEP.   Consulted and Agree with Plan of Care Patient          G-Codes - 12-17-2014 1544    Functional Assessment Tool Used Clinical judgement    Functional Limitation Carrying, moving and handling objects   Carrying, Moving and Handling Objects Current Status [phone]) At least 60 percent but less than 80 percent impaired, limited or restricted   Carrying, Moving and Handling Objects Goal Status (S2831) At least 60 percent but less than 80 percent impaired, limited or restricted   Carrying, Moving and Handling Objects Discharge Status [phone]) At least 60 percent but less than 80 percent impaired, limited or restricted  Problem List Patient Active Problem List   Diagnosis Date Noted  . Abdominal aneurysm without mention of rupture 03/21/2011    Ailene Ravel, OTR/L,CBIS  [phone]  12/07/2014, 3:47 PM  [place] [address] [place], [place], [address] Phone: [phone]   Fax:  [phone]

## 2014-12-26 DIAGNOSIS — H3531 Nonexudative age-related macular degeneration: Secondary | ICD-10-CM | POA: Diagnosis not present

## 2015-02-14 ENCOUNTER — Ambulatory Visit (INDEPENDENT_AMBULATORY_CARE_PROVIDER_SITE_OTHER): Payer: Medicare Other | Admitting: Urology

## 2015-02-14 DIAGNOSIS — N401 Enlarged prostate with lower urinary tract symptoms: Secondary | ICD-10-CM | POA: Diagnosis not present

## 2015-02-14 DIAGNOSIS — R972 Elevated prostate specific antigen [PSA]: Secondary | ICD-10-CM | POA: Diagnosis not present

## 2015-02-14 DIAGNOSIS — N390 Urinary tract infection, site not specified: Secondary | ICD-10-CM | POA: Diagnosis not present

## 2015-02-14 DIAGNOSIS — N402 Nodular prostate without lower urinary tract symptoms: Secondary | ICD-10-CM | POA: Diagnosis not present

## 2015-02-16 DIAGNOSIS — Z23 Encounter for immunization: Secondary | ICD-10-CM | POA: Diagnosis not present

## 2015-03-22 DIAGNOSIS — J00 Acute nasopharyngitis [common cold]: Secondary | ICD-10-CM | POA: Diagnosis not present

## 2015-05-17 DIAGNOSIS — M19011 Primary osteoarthritis, right shoulder: Secondary | ICD-10-CM | POA: Diagnosis not present

## 2015-05-19 ENCOUNTER — Encounter: Payer: Self-pay | Admitting: Family

## 2015-05-25 ENCOUNTER — Ambulatory Visit (INDEPENDENT_AMBULATORY_CARE_PROVIDER_SITE_OTHER): Payer: Medicare Other | Admitting: Family

## 2015-05-25 ENCOUNTER — Ambulatory Visit (HOSPITAL_COMMUNITY)
Admission: RE | Admit: 2015-05-25 | Discharge: 2015-05-25 | Disposition: A | Discharge status: Home / Self Care | Payer: Medicare Other | Source: Ambulatory Visit | Attending: Family | Admitting: Family

## 2015-05-25 ENCOUNTER — Encounter: Payer: Self-pay | Admitting: Family

## 2015-05-25 VITALS — BP 138/72 | HR 55 | Temp 97.2°F | Resp 14 | Ht 67.5 in | Wt 154.0 lb

## 2015-05-25 DIAGNOSIS — I714 Abdominal aortic aneurysm, without rupture, unspecified: Secondary | ICD-10-CM

## 2015-05-25 NOTE — Progress Notes (Signed)
[facility]  Established Abdominal Aortic Aneurysm  History of Present Illness  Shawn Frye is a 82 y.o. (29-Apr-1919) male patient of Dr. Donnetta Hutching with known AAA, returns today for routine surveillance.  He has a mild back ache in the mornings which resolves with activity, he denies new back pain, denies abdominal pain.    He denies any cardiac problems.  He denies any claudication symptoms, denies non-healing wounds.  He denies any history of stroke or TIA symptoms.  Most of his surgeries were related to OA issues.   States he is very active physically, does not drink ETOH.  Pt denies dizziness, denies dyspnea.  Pt Diabetic: No  Pt smoker: non-smoker  Pt meds include:  Statin :No, states his cholesterol is good ASA: No  Other anticoagulants/antiplatelets: no   Past Medical History  Diagnosis Date  . Arthritis   . GERD (gastroesophageal reflux disease)   . Joint pain   . AAA (abdominal aortic aneurysm) [facility])    Past Surgical History  Procedure Laterality Date  . Total knee arthroplasty      bilateral  . Total shoulder replacement Left 1999    partial  . Hernia repair  1991  . Hemorroidectomy    . Joint replacement Bilateral 1998 and  2003    Knee  . Spine surgery  2005   Social History Social History   Social History  . Marital Status: Married    Spouse Name: N/A  . Number of Children: N/A  . Years of Education: N/A   Occupational History  . Not on file.   Social History Main Topics  . Smoking status: Former Smoker    Types: Cigarettes    Quit date: 05/13/1950  . Smokeless tobacco: Never Used  . Alcohol Use: No  . Drug Use: No  . Sexual Activity: Not on file   Other Topics Concern  . Not on file   Social History Narrative   Family History Family History  Problem Relation Age of Onset  . Heart disease Father     NOT  before age 57  . Pneumonia Mother     Current Outpatient Prescriptions on File Prior to  Visit  Medication Sig Dispense Refill  . Multiple Vitamins-Minerals (ONE-A-DAY VITACRAVES PO) Take by mouth.    . naproxen sodium (ANAPROX) 220 MG tablet Take 220 mg by mouth as needed.      . Omeprazole (PRILOSEC PO) Take by mouth daily.      . Aspirin Buff, Al Hyd-Mg Hyd, (ASCRIPTIN PO) Take by mouth as needed. Reported on 05/25/2015    . Hydrocodone-Acetaminophen (VICODIN PO) Take by mouth as needed. Reported on 05/25/2015     No current facility-administered medications on file prior to visit.   Allergies  Allergen Reactions  . Percocet [Oxycodone-Acetaminophen] Hives  . Noroxin [Norfloxacin]   . Celebrex [Celecoxib] Rash    ROS: See HPI for pertinent positives and negatives.  Physical Examination  Filed Vitals:   05/25/15 0951  BP: 138/72  Pulse: 55  Temp: 97.2 F (36.2 C)  TempSrc: Oral  Resp: 14  Height: 5' 7.5" (1.715 m)  Weight: 154 lb (69.854 kg)  SpO2: 94%   Body mass index is 23.75 kg/(m^2).  General: WDWN in NAD  Gait: Normal  HENT: WNL  Eyes: Pupils equal  Pulmonary: normal non-labored breathing , without rales, rhonchi, or wheezing  Cardiac: RRR with occasional dropped beats, no appreciable murmurs Abdomen: soft, NT, no palpable masses  Skin: no rashes, no ulcers, no cellulitis.  Vascular Exam/Pulses:  VASCULAR EXAM  Carotid Bruits  Left  Right    Negative  Negative   Aorta is strongly palpable  Radial pulses are 2+ and =   VASCULAR EXAM:  Extremities without ischemic changes  without Gangrene; without open wounds.   LE Pulses  LEFT  RIGHT   FEMORAL  2+palpable  2+palpable   POPLITEAL  3+palpable  3+palpable   POSTERIOR TIBIAL  not palpable  not palpable   DORSALIS PEDIS  ANTERIOR TIBIAL  Faintly palpable  1+ palpable    Musculoskeletal: no muscle wasting or atrophy; trace peripheral edema   Neurologic: A&O X 3; Appropriate Affect, MOTOR FUNCTION: 5/5  Symmetric, CN 2-12 intact except for hard of hearing, wearing bilateral hearing aids.  Speech is fluent/normal               Non-Invasive Vascular Imaging  AAA Duplex (05/25/2015)  Previous size: 5.0 cm (Date: 11/17/14)  Current size:  5.2 cm (Date: 05/25/15)  Medical Decision Making  The patient is a 82 y.o. male who presents with asymptomatic AAA with minimal increase in size, largest diameter today of 5.2 cm compared to 5.0 cm on 11/17/14. I reviewed pt's AAA duplex results and pt's age with Dr. Donnetta Hutching. Fortunately the patient has never used tobacco and his blood pressure is in control.   Based on this patient's exam and diagnostic studies, the patient will follow up in 6 months  with the following studies: AAA duplex.  Consideration for repair of AAA would be made when the size is 5.5 cm, growth > 1 cm/yr, and symptomatic status.  I emphasized the importance of maximal medical management including strict control of blood pressure, blood glucose, and lipid levels, antiplatelet agents, obtaining regular exercise, and continued cessation of smoking.   The patient was given information about AAA including signs, symptoms, treatment, and how to minimize the risk of enlargement and rupture of aneurysms.    The patient was advised to call 911 should the patient experience sudden onset abdominal or back pain.   Thank you for allowing [place] to participate in this patient's care.  Clemon Chambers, RN, MSN, FNP-C [facility] Office: [facility] Physician: Oneida Alar  05/25/2015, 10:15 AM

## 2015-05-25 NOTE — Patient Instructions (Signed)
Abdominal Aortic Aneurysm An aneurysm is a weakened or damaged part of an artery wall that bulges from the normal force of blood pumping through the body. An abdominal aortic aneurysm is an aneurysm that occurs in the lower part of the aorta, the main artery of the body.  The major concern with an abdominal aortic aneurysm is that it can enlarge and burst (rupture) or blood can flow between the layers of the wall of the aorta through a tear (aorticdissection). Both of these conditions can cause bleeding inside the body and can be life threatening unless diagnosed and treated promptly. CAUSES  The exact cause of an abdominal aortic aneurysm is unknown. Some contributing factors are:   A hardening of the arteries caused by the buildup of fat and other substances in the lining of a blood vessel (arteriosclerosis).  Inflammation of the walls of an artery (arteritis).   Connective tissue diseases, such as Marfan syndrome.   Abdominal trauma.   An infection, such as syphilis or staphylococcus, in the wall of the aorta (infectious aortitis) caused by bacteria. RISK FACTORS  Risk factors that contribute to an abdominal aortic aneurysm may include:  Age older than 60 years.   High blood pressure (hypertension).  Male gender.  Ethnicity (white race).  Obesity.  Family history of aneurysm (first degree relatives only).  Tobacco use. PREVENTION  The following healthy lifestyle habits may help decrease your risk of abdominal aortic aneurysm:  Quitting smoking. Smoking can raise your blood pressure and cause arteriosclerosis.  Limiting or avoiding alcohol.  Keeping your blood pressure, blood sugar level, and cholesterol levels within normal limits.  Decreasing your salt intake. In somepeople, too much salt can raise blood pressure and increase your risk of abdominal aortic aneurysm.  Eating a diet low in saturated fats and cholesterol.  Increasing your fiber intake by including  whole grains, vegetables, and fruits in your diet. Eating these foods may help lower blood pressure.  Maintaining a healthy weight.  Staying physically active and exercising regularly. SYMPTOMS  The symptoms of abdominal aortic aneurysm may vary depending on the size and rate of growth of the aneurysm.Most grow slowly and do not have any symptoms. When symptoms do occur, they may include:  Pain (abdomen, side, lower back, or groin). The pain may vary in intensity. A sudden onset of severe pain may indicate that the aneurysm has ruptured.  Feeling full after eating only small amounts of food.  Nausea or vomiting or both.  Feeling a pulsating lump in the abdomen.  Feeling faint or passing out. DIAGNOSIS  Since most unruptured abdominal aortic aneurysms have no symptoms, they are often discovered during diagnostic exams for other conditions. An aneurysm may be found during the following procedures:  Ultrasonography (A one-time screening for abdominal aortic aneurysm by ultrasonography is also recommended for all men aged 65-75 years who have ever smoked).  X-ray exams.  A computed tomography (CT).  Magnetic resonance imaging (MRI).  Angiography or arteriography. TREATMENT  Treatment of an abdominal aortic aneurysm depends on the size of your aneurysm, your age, and risk factors for rupture. Medication to control blood pressure and pain may be used to manage aneurysms smaller than 6 cm. Regular monitoring for enlargement may be recommended by your caregiver if:  The aneurysm is 3-4 cm in size (an annual ultrasonography may be recommended).  The aneurysm is 4-4.5 cm in size (an ultrasonography every 6 months may be recommended).  The aneurysm is larger than 4.5 cm in   size (your caregiver may ask that you be examined by a vascular surgeon). If your aneurysm is larger than 6 cm, surgical repair may be recommended. There are two main methods for repair of an aneurysm:   Endovascular  repair (a minimally invasive surgery). This is done most often.  Open repair. This method is used if an endovascular repair is not possible.   This information is not intended to replace advice given to you by your health care provider. Make sure you discuss any questions you have with your health care provider.   Document Released: 02/06/2005 Document Revised: 08/24/2012 Document Reviewed: 05/29/2012 Elsevier Interactive Patient Education 2016 [employer]vier Inc[employer]  

## 2015-06-01 ENCOUNTER — Ambulatory Visit (INDEPENDENT_AMBULATORY_CARE_PROVIDER_SITE_OTHER): Payer: Medicare Other | Admitting: Otolaryngology

## 2015-06-01 DIAGNOSIS — H903 Sensorineural hearing loss, bilateral: Secondary | ICD-10-CM

## 2015-06-01 DIAGNOSIS — H6123 Impacted cerumen, bilateral: Secondary | ICD-10-CM

## 2015-06-01 DIAGNOSIS — H7201 Central perforation of tympanic membrane, right ear: Secondary | ICD-10-CM

## 2015-06-07 DIAGNOSIS — N390 Urinary tract infection, site not specified: Secondary | ICD-10-CM | POA: Diagnosis not present

## 2015-06-07 DIAGNOSIS — E782 Mixed hyperlipidemia: Secondary | ICD-10-CM | POA: Diagnosis not present

## 2015-06-09 DIAGNOSIS — I714 Abdominal aortic aneurysm, without rupture: Secondary | ICD-10-CM | POA: Diagnosis not present

## 2015-06-09 DIAGNOSIS — N401 Enlarged prostate with lower urinary tract symptoms: Secondary | ICD-10-CM | POA: Diagnosis not present

## 2015-06-09 DIAGNOSIS — R0602 Shortness of breath: Secondary | ICD-10-CM | POA: Diagnosis not present

## 2015-06-09 DIAGNOSIS — N390 Urinary tract infection, site not specified: Secondary | ICD-10-CM | POA: Diagnosis not present

## 2015-07-17 ENCOUNTER — Encounter (HOSPITAL_COMMUNITY): Payer: Self-pay

## 2015-08-29 ENCOUNTER — Encounter (HOSPITAL_COMMUNITY): Payer: Self-pay | Admitting: *Deleted

## 2015-08-29 ENCOUNTER — Ambulatory Visit (INDEPENDENT_AMBULATORY_CARE_PROVIDER_SITE_OTHER): Payer: Medicare Other | Admitting: Urology

## 2015-08-29 ENCOUNTER — Observation Stay (HOSPITAL_COMMUNITY)
Admission: EM | Admit: 2015-08-29 | Discharge: 2015-08-30 | Disposition: A | Discharge status: Home / Self Care | Payer: Medicare Other | Attending: Internal Medicine | Admitting: Internal Medicine

## 2015-08-29 ENCOUNTER — Emergency Department (HOSPITAL_COMMUNITY): Payer: Medicare Other

## 2015-08-29 DIAGNOSIS — Z79899 Other long term (current) drug therapy: Secondary | ICD-10-CM | POA: Diagnosis not present

## 2015-08-29 DIAGNOSIS — Z87891 Personal history of nicotine dependence: Secondary | ICD-10-CM | POA: Insufficient documentation

## 2015-08-29 DIAGNOSIS — R300 Dysuria: Secondary | ICD-10-CM

## 2015-08-29 DIAGNOSIS — M1990 Unspecified osteoarthritis, unspecified site: Secondary | ICD-10-CM | POA: Insufficient documentation

## 2015-08-29 DIAGNOSIS — R001 Bradycardia, unspecified: Secondary | ICD-10-CM | POA: Diagnosis not present

## 2015-08-29 DIAGNOSIS — Z23 Encounter for immunization: Secondary | ICD-10-CM | POA: Diagnosis not present

## 2015-08-29 DIAGNOSIS — R972 Elevated prostate specific antigen [PSA]: Secondary | ICD-10-CM | POA: Diagnosis not present

## 2015-08-29 DIAGNOSIS — N403 Nodular prostate with lower urinary tract symptoms: Secondary | ICD-10-CM | POA: Diagnosis not present

## 2015-08-29 LAB — BASIC METABOLIC PANEL
ANION GAP: 6 (ref 5–15)
BUN: 18 mg/dL (ref 6–20)
CO2: 25 mmol/L (ref 22–32)
CREATININE: 0.94 mg/dL (ref 0.61–1.24)
Calcium: 8.3 mg/dL — ABNORMAL LOW (ref 8.9–10.3)
Chloride: 109 mmol/L (ref 101–111)
GFR calc non Af Amer: >60 mL/min (ref >60)
Glucose, Bld: 92 mg/dL (ref 65–99)
Potassium: 4.1 mmol/L (ref 3.5–5.1)
SODIUM: 140 mmol/L (ref 135–145)

## 2015-08-29 LAB — CBC
HEMATOCRIT: 31.9 % — AB (ref 39.0–52.0)
HEMOGLOBIN: 10.7 g/dL — AB (ref 13.0–17.0)
MCH: 31.6 pg (ref 26.0–34.0)
MCHC: 33.5 g/dL (ref 30.0–36.0)
MCV: 94.1 fL (ref 78.0–100.0)
PLATELETS: 142 10*3/uL — AB (ref 150–400)
RBC: 3.39 MIL/uL — AB (ref 4.22–5.81)
RDW: 14.3 % (ref 11.5–15.5)
WBC: 7.6 10*3/uL (ref 4.0–10.5)

## 2015-08-29 LAB — TROPONIN I

## 2015-08-29 MED ORDER — ACETAMINOPHEN 650 MG RE SUPP: 650.0000 mg | Freq: Four times a day (QID) | RECTAL | Status: DC | PRN

## 2015-08-29 MED ORDER — SODIUM CHLORIDE 0.9% FLUSH
3.0000 mL | Freq: Two times a day (BID) | INTRAVENOUS | Status: DC
  Administered 2015-08-29 – 2015-08-30 (×2): 3 mL via INTRAVENOUS

## 2015-08-29 MED ORDER — ENOXAPARIN SODIUM 40 MG/0.4ML ~~LOC~~ SOLN: 40.0000 mg | SUBCUTANEOUS | Status: DC

## 2015-08-29 MED ORDER — PNEUMOCOCCAL VAC POLYVALENT 25 MCG/0.5ML IJ INJ
0.5000 mL | INJECTION | INTRAMUSCULAR | Status: AC
  Administered 2015-08-30: 0.5 mL via INTRAMUSCULAR
  Filled 2015-08-29: qty 0.5

## 2015-08-29 MED ORDER — SODIUM CHLORIDE 0.9 % IV SOLN
INTRAVENOUS | Status: DC
  Administered 2015-08-29: 22:00:00 via INTRAVENOUS

## 2015-08-29 MED ORDER — ACETAMINOPHEN 325 MG PO TABS
650.0000 mg | ORAL_TABLET | Freq: Four times a day (QID) | ORAL | Status: DC | PRN
  Administered 2015-08-30: 650 mg via ORAL
  Filled 2015-08-29: qty 2

## 2015-08-29 MED ORDER — ONDANSETRON HCL 4 MG/2ML IJ SOLN: 4.0000 mg | Freq: Three times a day (TID) | INTRAMUSCULAR | Status: AC | PRN

## 2015-08-29 MED ORDER — ENOXAPARIN SODIUM 40 MG/0.4ML ~~LOC~~ SOLN
40.0000 mg | SUBCUTANEOUS | Status: DC
  Administered 2015-08-29: 40 mg via SUBCUTANEOUS
  Filled 2015-08-29: qty 0.4

## 2015-08-29 NOTE — ED Notes (Addendum)
Pt sent over by [facility] due to bradycardia. Heart rate 41 upon arrival. Patient does report mild "dizziness". Pt ambulatory to triage, alert and oriented. Pt also reports being told he has a bladder infection and was told we would treat that here.

## 2015-08-29 NOTE — ED Provider Notes (Signed)
CSN: [id number]     Arrival date & time 08/29/15  1510 History   First MD Initiated Contact with Patient 08/29/15 1535     Chief Complaint  Patient presents with  . Bradycardia     (Consider location/radiation/quality/duration/timing/severity/associated sxs/prior Treatment) HPI  The patient is a 82 year old male, sent from the urology office because of bradycardia. He states that he was following up for his regular urology visit when they found his heart rate to be 40, they sent him to the ER. He denies any specific symptoms, he denies any dizziness chest pain shortness of breath weakness headaches, numbness, difficulty walking.  He states he has no hx of heart disease and no cardiology visits in the past.  He has known AAA but non op at this point.  Max diameter 5 cm in mid Aorta with no change since prior study.  D/w Dr. Alyson Ingles who states that the patient was tired this morning and reported increased fatigue over the last couple of days but otherwise had no other abnormal complaints or findings. He reports that he did have some bacteriuria on urine sample and they are sending a culture, they request that we do not treat until the culture report is back and their office will follow that up.  Past Medical History  Diagnosis Date  . Arthritis   . GERD (gastroesophageal reflux disease)   . Joint pain   . AAA (abdominal aortic aneurysm) [facility])    Past Surgical History  Procedure Laterality Date  . Total knee arthroplasty      bilateral  . Total shoulder replacement Left 1999    partial  . Hernia repair  1991  . Hemorroidectomy    . Joint replacement Bilateral 1998 and  2003    Knee  . Spine surgery  2005   Family History  Problem Relation Age of Onset  . Heart disease Father     NOT  before age 77  . Pneumonia Mother    Social History  Substance Use Topics  . Smoking status: Former Smoker    Types: Cigarettes    Quit date: 05/13/1950  . Smokeless tobacco: Never Used  .  Alcohol Use: No    Review of Systems  All other systems reviewed and are negative.     Allergies  Percocet; Noroxin; and Celebrex  Home Medications   Prior to Admission medications   Medication Sig Start Date End Date Taking? Authorizing Provider  omeprazole (PRILOSEC OTC) 20 MG tablet Take 20 mg by mouth 4 (four) times a week.   Yes Historical Provider, MD  naproxen sodium (ANAPROX) 220 MG tablet Take 220 mg by mouth as needed (pain).     Historical Provider, MD   BP 161/63 mmHg  Pulse 37  Temp(Src) 99 F (37.2 C) (Temporal)  Resp 15  Ht 5\' 7"  (1.702 m)  Wt 155 lb (70.308 kg)  BMI 24.27 kg/m2  SpO2 96% Physical Exam  Constitutional: He appears well-developed and well-nourished. No distress.  HENT:  Head: Normocephalic and atraumatic.  Mouth/Throat: Oropharynx is clear and moist. No oropharyngeal exudate.  Eyes: Conjunctivae and EOM are normal. Pupils are equal, round, and reactive to light. Right eye exhibits no discharge. Left eye exhibits no discharge. No scleral icterus.  Neck: Normal range of motion. Neck supple. No JVD present. No thyromegaly present.  Cardiovascular: Regular rhythm, normal heart sounds and intact distal pulses.  Exam reveals no gallop and no friction rub.   No murmur heard. Bradycardic to 40  bpm, strong pulses, normal blood pressure  Pulmonary/Chest: Effort normal and breath sounds normal. No respiratory distress. He has no wheezes. He has no rales.  Abdominal: Soft. Bowel sounds are normal. He exhibits no distension and no mass. There is no tenderness.  No palpable pulsating mass, no abdominal tenderness to palpation  Musculoskeletal: Normal range of motion. He exhibits no edema or tenderness.  Lymphadenopathy:    He has no cervical adenopathy.  Neurological: He is alert. Coordination normal.  Follows commands, normal gait, normal speech, normal coordination  Skin: Skin is warm and dry. No rash noted. No erythema.  Psychiatric: He has a normal  mood and affect. His behavior is normal.  Nursing note and vitals reviewed.   ED Course  Procedures (including critical care time) Labs Review Labs Reviewed  CBC - Abnormal; Notable for the following:    RBC 3.39 (*)    Hemoglobin 10.7 (*)    HCT 31.9 (*)    Platelets 142 (*)    All other components within normal limits  BASIC METABOLIC PANEL - Abnormal; Notable for the following:    Calcium 8.3 (*)    All other components within normal limits  TROPONIN I    Imaging Review Dg Chest 2 View  08/29/2015  CLINICAL DATA:  Bradycardia, former smoker EXAM: CHEST  2 VIEW COMPARISON:  08/04/2011 FINDINGS: Upper normal heart size. Calcified tortuous thoracic aorta. Pulmonary vascularity normal. Emphysematous changes with minimal bibasilar atelectasis versus scarring. Remaining lungs clear. No definite infiltrate, pleural effusion or pneumothorax. Bones demineralized with note of LEFT shoulder prosthesis and RIGHT glenohumeral degenerative changes. IMPRESSION: COPD changes with bibasilar atelectasis versus scarring. Electronically Signed   By: Lavonia Dana M.D.   On: 08/29/2015 17:08   I have personally reviewed and evaluated these images and lab results as part of my medical decision-making.   EKG Interpretation   Date/Time:  Tuesday August 29 2015 15:23:20 EDT Ventricular Rate:  40 PR Interval:    QRS Duration: 104 QT Interval:  546 QTC Calculation: 444 R Axis:   -37 Text Interpretation:  bradycardia. Left axis deviation Minimal voltage  criteria for LVH, may be normal variant Nonspecific ST abnormality  Abnormal ECG possible hidden P in the T wave n V1 seen best Confirmed by  Seraya Jobst  MD, Jaydynn Wolford ([id number]) on 08/29/2015 3:56:10 PM      MDM   Final diagnoses:  Bradycardia    The patient is well appearing despite having a pulse of 40, I have placed him on a cardiac monitor and his pulse remains in the 40s, I have discussed his care with the urologist and will discuss with the  cardiologist as well. Labs ordered   - d/w Dr. Domenic Polite at 4:45 - agrees that f/u is needed with cardiac monitorin  Will keep for cardiac monitoring overnight given age and magnitude of bradycardia  D/w Dr. Darrick Meigs - 5:45 - will admit - requests holding orders    Noemi Chapel, MD 08/29/15 1751

## 2015-08-29 NOTE — Progress Notes (Signed)
Received report from [facility] in ED regarding patient coming to rm 330. Oswald Hillock, RN

## 2015-08-29 NOTE — H&P (Signed)
PCP:   Wende Neighbors, MD   Chief Complaint:  Bradycardia  HPI: 82 year old male who   has a past medical history of Arthritis; GERD (gastroesophageal reflux disease); Joint pain; and AAA (abdominal aortic aneurysm) ([place]). patient was sent to the hospital from urology office due to bradycardia. Patient went to see his urologist for her visit and was found to have heart rate in 40s. Patient denies any specific symptoms of dizziness, no chest pain no shortness of breath. No nausea vomiting or diarrhea. No difficulty walking. No history of syncope. The only complaint is fatigue on walking. Patient has no history of abdominal aortic aneurysm maximal diameter 5 cm and mid aorta with no change from prior study. Also patient was found to have bacteriuria on urine sample and urology clinic has sent a culture. Dr. Sabra Heck discussed with cardiologist Dr. Domenic Polite, who recommended overnight observation under telemetry.  Allergies:   Allergies  Allergen Reactions  . Percocet [Oxycodone-Acetaminophen] Hives  . Noroxin [Norfloxacin] Nausea And Vomiting  . Celebrex [Celecoxib] Rash      Past Medical History  Diagnosis Date  . Arthritis   . GERD (gastroesophageal reflux disease)   . Joint pain   . AAA (abdominal aortic aneurysm) [facility])     Past Surgical History  Procedure Laterality Date  . Total knee arthroplasty      bilateral  . Total shoulder replacement Left 1999    partial  . Hernia repair  1991  . Hemorroidectomy    . Joint replacement Bilateral 1998 and  2003    Knee  . Spine surgery  2005    Prior to Admission medications   Medication Sig Start Date End Date Taking? Authorizing Provider  omeprazole (PRILOSEC OTC) 20 MG tablet Take 20 mg by mouth 4 (four) times a week.   Yes Historical Provider, MD  naproxen sodium (ANAPROX) 220 MG tablet Take 220 mg by mouth as needed (pain).     Historical Provider, MD    Social History:  reports that he quit smoking about 65 years ago. His  smoking use included Cigarettes. He has never used smokeless tobacco. He reports that he does not drink alcohol or use illicit drugs.  Family History  Problem Relation Age of Onset  . Heart disease Father     NOT  before age 32  . Pneumonia Mother     Danley Danker Weights   08/29/15 1517  Weight: 70.308 kg (155 lb)    All the positives are listed in BOLD  Review of Systems:  HEENT: Headache, blurred vision, runny nose, sore throat Neck: Hypothyroidism, hyperthyroidism,,lymphadenopathy Chest : Shortness of breath, history of COPD, Asthma Heart : Chest pain, history of coronary arterey disease GI:  Nausea, vomiting, diarrhea, constipation, GERD GU: Dysuria, urgency, frequency of urination, hematuria Neuro: Stroke, seizures, syncope Psych: Depression, anxiety, hallucinations   Physical Exam: Blood pressure 161/63, pulse 37, temperature 99 F (37.2 C), temperature source Temporal, resp. rate 15, height 5\' 7"  (1.702 m), weight 70.308 kg (155 lb), SpO2 96 %. Constitutional:   Patient is a well-developed and well-nourished  Male in no acute distress and cooperative with exam. Head: Normocephalic and atraumatic Mouth: Mucus membranes moist Eyes: PERRL, EOMI, conjunctivae normal Neck: Supple, No Thyromegaly Cardiovascular: RRR, S1 normal, S2 normal Pulmonary/Chest: CTAB, no wheezes, rales, or rhonchi Abdominal: Soft. Non-tender, non-distended, bowel sounds are normal, no masses, organomegaly, or guarding present.  Neurological: A&O x3, Strength is normal and symmetric bilaterally, cranial nerve II-XII are grossly intact, no focal  motor deficit, sensory intact to light touch bilaterally.  Extremities : No Cyanosis, Clubbing or Edema  Labs on Admission:  Basic Metabolic Panel:  Recent Labs Lab 08/29/15 1616  NA 140  K 4.1  CL 109  CO2 25  GLUCOSE 92  BUN 18  CREATININE 0.94  CALCIUM 8.3*   CBC:  Recent Labs Lab 08/29/15 1616  WBC 7.6  HGB 10.7*  HCT 31.9*  MCV 94.1  PLT  142*   Cardiac Enzymes:  Recent Labs Lab 08/29/15 1616  TROPONINI <0.03     Radiological Exams on Admission: Dg Chest 2 View  08/29/2015  CLINICAL DATA:  Bradycardia, former smoker EXAM: CHEST  2 VIEW COMPARISON:  08/04/2011 FINDINGS: Upper normal heart size. Calcified tortuous thoracic aorta. Pulmonary vascularity normal. Emphysematous changes with minimal bibasilar atelectasis versus scarring. Remaining lungs clear. No definite infiltrate, pleural effusion or pneumothorax. Bones demineralized with note of LEFT shoulder prosthesis and RIGHT glenohumeral degenerative changes. IMPRESSION: COPD changes with bibasilar atelectasis versus scarring. Electronically Signed   By: Lavonia Dana M.D.   On: 08/29/2015 17:08    EKG: Independently reviewed. Sinus bradycardia   Assessment/Plan Active Problems:   Bradycardia   Bradycardia Patient had heart rate in 40s, without any symptoms. Will admit to telemetry Will check echocardiogram in a.m. Patient might require Holter monitoring at time of discharge Will consult cardiology in a.m.  Asymptomatic bacteriuria Patient has history of asymptomatic bacteriuria as per urology, urology clinic has obtained urine culture No need to treat at this time. Will await the culture results from the urology clinic.  DVT prophylaxis Lovenox   Code status: full code  Family discussion: no family at bedside   Time Spent on Admission: 60 min  [facility] Pager: [phone] 08/29/2015, 6:00 PM  If 7PM-7AM, please contact night-coverage  [email or web address]  Password [email or web address]

## 2015-08-30 ENCOUNTER — Observation Stay (HOSPITAL_BASED_OUTPATIENT_CLINIC_OR_DEPARTMENT_OTHER): Payer: Medicare Other

## 2015-08-30 DIAGNOSIS — I441 Atrioventricular block, second degree: Secondary | ICD-10-CM | POA: Diagnosis not present

## 2015-08-30 DIAGNOSIS — R001 Bradycardia, unspecified: Principal | ICD-10-CM

## 2015-08-30 DIAGNOSIS — R9431 Abnormal electrocardiogram [ECG] [EKG]: Secondary | ICD-10-CM

## 2015-08-30 LAB — CBC
HCT: 34 % — ABNORMAL LOW (ref 39.0–52.0)
Hemoglobin: 11.4 g/dL — ABNORMAL LOW (ref 13.0–17.0)
MCH: 31.5 pg (ref 26.0–34.0)
MCHC: 33.5 g/dL (ref 30.0–36.0)
MCV: 93.9 fL (ref 78.0–100.0)
PLATELETS: 160 10*3/uL (ref 150–400)
RBC: 3.62 MIL/uL — AB (ref 4.22–5.81)
RDW: 14.2 % (ref 11.5–15.5)
WBC: 7.6 10*3/uL (ref 4.0–10.5)

## 2015-08-30 LAB — COMPREHENSIVE METABOLIC PANEL
ALT: 11 U/L — AB (ref 17–63)
AST: 20 U/L (ref 15–41)
Albumin: 4 g/dL (ref 3.5–5.0)
Alkaline Phosphatase: 81 U/L (ref 38–126)
Anion gap: 8 (ref 5–15)
BUN: 17 mg/dL (ref 6–20)
CALCIUM: 8.9 mg/dL (ref 8.9–10.3)
CHLORIDE: 110 mmol/L (ref 101–111)
CO2: 22 mmol/L (ref 22–32)
CREATININE: 0.82 mg/dL (ref 0.61–1.24)
Glucose, Bld: 98 mg/dL (ref 65–99)
Potassium: 4 mmol/L (ref 3.5–5.1)
Sodium: 140 mmol/L (ref 135–145)
TOTAL PROTEIN: 7.1 g/dL (ref 6.5–8.1)
Total Bilirubin: 1.1 mg/dL (ref 0.3–1.2)

## 2015-08-30 LAB — TROPONIN I

## 2015-08-30 LAB — ECHOCARDIOGRAM COMPLETE
Height: 67 in
Weight: 2419.2 oz

## 2015-08-30 NOTE — Discharge Summary (Signed)
Physician Discharge Summary  Shawn Frye [id number] DOB: 1918/09/14 DOA: 08/29/2015  PCP: Wende Neighbors, MD  Admit date: 08/29/2015 Discharge date: 08/30/2015  Time spent: 45 minutes  Recommendations for Outpatient Follow-up:  -Will be discharged home today. -Follow-up appointment has been scheduled with the cardiology office.   Discharge Diagnoses:  Active Problems:   Bradycardia   Discharge Condition: Stable and improved  Filed Weights   08/29/15 1517 08/29/15 1915  Weight: 70.308 kg (155 lb) 68.584 kg (151 lb 3.2 oz)    History of present illness:  As per Dr. Darrick Meigs on 4/18: 82 year old male who  has a past medical history of Arthritis; GERD (gastroesophageal reflux disease); Joint pain; and AAA (abdominal aortic aneurysm) ([place]). patient was sent to the hospital from urology office due to bradycardia. Patient went to see his urologist for her visit and was found to have heart rate in 40s. Patient denies any specific symptoms of dizziness, no chest pain no shortness of breath. No nausea vomiting or diarrhea. No difficulty walking. No history of syncope. The only complaint is fatigue on walking. Patient has no history of abdominal aortic aneurysm maximal diameter 5 cm and mid aorta with no change from prior study. Also patient was found to have bacteriuria on urine sample and urology clinic has sent a culture. Dr. Sabra Heck discussed with cardiologist Dr. Domenic Polite, who recommended overnight observation under telemetry.   Hospital Course:   Bradycardia -Has been maintaining heart rates in the 40s to low 50s while in the hospital. -Telemetry shows both Wenckebach and 2-1 heart block but no pauses or higher degree heart block. -Has been seen in consultation by cardiology who has in turn discussed with EP M.D. -It has been determined that we will adopt cautious observation given his lack of major limiting symptoms anticipating that he may ultimately need a pacemaker. -Okay to  discharge home today with close outpatient cardiology follow-up.  Asymptomatic bacteriuria -No indication for treatment at present. -Will need follow-up with urology outpatient for final culture data and determination of treatment if cultures positive.  Procedures:  None   Consultations:  Cardiology  Discharge Instructions  Discharge Instructions    Increase activity slowly    Complete by:  As directed             Medication List    TAKE these medications        naproxen sodium 220 MG tablet  Commonly known as:  ANAPROX  Take 220 mg by mouth as needed (pain).     omeprazole 20 MG tablet  Commonly known as:  PRILOSEC OTC  Take 20 mg by mouth 4 (four) times a week.       Allergies  Allergen Reactions  . Percocet [Oxycodone-Acetaminophen] Hives  . Noroxin [Norfloxacin] Nausea And Vomiting  . Celebrex [Celecoxib] Rash       Follow-up Information    Follow up with Rozann Lesches, MD On 09/13/2015.   Specialty:  Cardiology   Why:  at 1:00 pm in the [place] office   Contact information:   [address] [place] [phone]        The results of significant diagnostics from this hospitalization (including imaging, microbiology, ancillary and laboratory) are listed below for reference.    Significant Diagnostic Studies: Dg Chest 2 View  08/29/2015  CLINICAL DATA:  Bradycardia, former smoker EXAM: CHEST  2 VIEW COMPARISON:  08/04/2011 FINDINGS: Upper normal heart size. Calcified tortuous thoracic aorta. Pulmonary vascularity normal. Emphysematous changes  with minimal bibasilar atelectasis versus scarring. Remaining lungs clear. No definite infiltrate, pleural effusion or pneumothorax. Bones demineralized with note of LEFT shoulder prosthesis and RIGHT glenohumeral degenerative changes. IMPRESSION: COPD changes with bibasilar atelectasis versus scarring. Electronically Signed   By: Lavonia Dana M.D.   On: 08/29/2015 17:08    Microbiology: No  results found for this or any previous visit (from the past 240 hour(s)).   Labs: Basic Metabolic Panel:  Recent Labs Lab 08/29/15 1616 08/30/15 0310  NA 140 140  K 4.1 4.0  CL 109 110  CO2 25 22  GLUCOSE 92 98  BUN 18 17  CREATININE 0.94 0.82  CALCIUM 8.3* 8.9   Liver Function Tests:  Recent Labs Lab 08/30/15 0310  AST 20  ALT 11*  ALKPHOS 81  BILITOT 1.1  PROT 7.1  ALBUMIN 4.0   No results for input(s): LIPASE, AMYLASE in the last 168 hours. No results for input(s): AMMONIA in the last 168 hours. CBC:  Recent Labs Lab 08/29/15 1616 08/30/15 0310  WBC 7.6 7.6  HGB 10.7* 11.4*  HCT 31.9* 34.0*  MCV 94.1 93.9  PLT 142* 160   Cardiac Enzymes:  Recent Labs Lab 08/29/15 1616 08/29/15 2129 08/30/15 0310 08/30/15 0950  TROPONINI <0.03 <0.03 <0.03 <0.03   BNP: BNP (last 3 results) No results for input(s): BNP in the last 8760 hours.  ProBNP (last 3 results) No results for input(s): PROBNP in the last 8760 hours.  CBG: No results for input(s): GLUCAP in the last 168 hours.     SignedLelon Frohlich  [facility] Pager: [phone] 08/30/2015, 3:24 PM

## 2015-08-30 NOTE — Care Management Obs Status (Signed)
[place] NOTIFICATION   Patient Details  Name: Shawn Frye MRN: [id number] Date of Birth: January 10, 1919   Medicare Observation Status Notification Given:  Yes    Alvie Heidelberg, RN 08/30/2015, 12:43 PM

## 2015-08-30 NOTE — Care Management Note (Signed)
Case Management Note  Patient Details  Name: Shawn Frye MRN: [id number] Date of Birth: [birth year]  Subjective/Objective:       Spoke with patient and spouse . Patient ambulating in room and alert oriented. Stated that the doctor wanted to monitor him for "about a month"  To see how his heart does.  He claims he is independent at home. Spouse is independent as well and can take him to appointments.  No DME at home.  Not Home Bound. No CM needs identified.           Action/Plan: Home with Self Care.   Expected Discharge Date:                  Expected Discharge Plan:  Home/Self Care  In-House Referral:     Discharge [place] not met per provider  Post Acute Care Choice:    Choice offered to:     DME Arranged:    DME Agency:     HH Arranged:    HH Agency:     Status of Service:  Completed, signed off  Medicare Important Message Given:    Date Medicare IM Given:    Medicare IM give by:    Date Additional Medicare IM Given:    Additional Medicare Important Message give by:     If discussed at [place] of Stay Meetings, dates discussed:    Additional Comments:  Alvie Heidelberg, RN 08/30/2015, 1:12 PM

## 2015-08-30 NOTE — Consult Note (Signed)
Reason for Consult: Bradycardia Referring Physician: PTH, Dr. Eleonore Chiquito Consulting cardiologist: Dr. Satira Sark  Shawn Frye is an 82 y.o. male patient who was visiting his urologist yesterday and was found to be bradycardic in the 76s. He was asymptomatic at that time. He was sent to the emergency room for further evaluation. There was question of 2:1 AV block. He has a history of abdominal aortic aneurysm 5.1 x 5.2 on ultrasound in 05/2015, followed by Dr. Donnetta Hutching. He has no prior cardiac history. ECG shows sinus bradycardia at 39 bpm with first-degree AV block versus 2:1 block.  Patient is relatively active and doesn't take any regular meds. Takes care of his wife with cerebral hemorrhage. He has noticed weakness in early am when he first gets up. He has to sit in his recliner for a while before he can get moving, but once he gets going he feels 'fine." He walks 30 min daily and over the past month becomes mildly short of breath at the end of his walk. No chest tightness, pressure, dizziness or presyncope.  Past Medical History  Diagnosis Date  . Arthritis   . GERD (gastroesophageal reflux disease)   . Joint pain   . AAA (abdominal aortic aneurysm) [facility])     Past Surgical History  Procedure Laterality Date  . Total knee arthroplasty      bilateral  . Total shoulder replacement Left 1999    partial  . Hernia repair  1991  . Hemorroidectomy    . Joint replacement Bilateral 1998 and  2003    Knee  . Spine surgery  2005    Family History  Problem Relation Age of Onset  . Heart disease Father     NOT  before age 84  . Pneumonia Mother     Social History:  reports that he quit smoking about 65 years ago. His smoking use included Cigarettes. He has never used smokeless tobacco. He reports that he does not drink alcohol or use illicit drugs.  Allergies:  Allergies  Allergen Reactions  . Percocet [Oxycodone-Acetaminophen] Hives  . Noroxin [Norfloxacin] Nausea And  Vomiting  . Celebrex [Celecoxib] Rash    Medications: Scheduled Meds: . enoxaparin (LOVENOX) injection  40 mg Subcutaneous Q24H  . sodium chloride flush  3 mL Intravenous Q12H   Continuous Infusions: . sodium chloride 10 mL/hr at 08/29/15 2213   PRN Meds:.acetaminophen **OR** acetaminophen   Results for orders placed or performed during the hospital encounter of 08/29/15 (from the past 48 hour(s))  CBC     Status: Abnormal   Collection Time: 08/29/15  4:16 PM  Result Value Ref Range   WBC 7.6 4.0 - 10.5 K/uL   RBC 3.39 (L) 4.22 - 5.81 MIL/uL   Hemoglobin 10.7 (L) 13.0 - 17.0 g/dL   HCT 31.9 (L) 39.0 - 52.0 %   MCV 94.1 78.0 - 100.0 fL   MCH 31.6 26.0 - 34.0 pg   MCHC 33.5 30.0 - 36.0 g/dL   RDW 14.3 11.5 - 15.5 %   Platelets 142 (L) 150 - 400 K/uL  Basic metabolic panel     Status: Abnormal   Collection Time: 08/29/15  4:16 PM  Result Value Ref Range   Sodium 140 135 - 145 mmol/L   Potassium 4.1 3.5 - 5.1 mmol/L   Chloride 109 101 - 111 mmol/L   CO2 25 22 - 32 mmol/L   Glucose, Bld 92 65 - 99 mg/dL  BUN 18 6 - 20 mg/dL   Creatinine, Ser 0.94 0.61 - 1.24 mg/dL   Calcium 8.3 (L) 8.9 - 10.3 mg/dL   GFR calc non Af Amer >60 >60 mL/min   GFR calc Af Amer >60 >60 mL/min    Comment: (NOTE) The eGFR has been calculated using the CKD EPI equation. This calculation has not been validated in all clinical situations. eGFR's persistently <60 mL/min signify possible Chronic Kidney Disease.    Anion gap 6 5 - 15  Troponin I     Status: None   Collection Time: 08/29/15  4:16 PM  Result Value Ref Range   Troponin I <0.03 <0.031 ng/mL    Comment:        NO INDICATION OF MYOCARDIAL INJURY.   Troponin I     Status: None   Collection Time: 08/29/15  9:29 PM  Result Value Ref Range   Troponin I <0.03 <0.031 ng/mL    Comment:        NO INDICATION OF MYOCARDIAL INJURY.   CBC     Status: Abnormal   Collection Time: 08/30/15  3:10 AM  Result Value Ref Range   WBC 7.6 4.0 -  10.5 K/uL   RBC 3.62 (L) 4.22 - 5.81 MIL/uL   Hemoglobin 11.4 (L) 13.0 - 17.0 g/dL   HCT 34.0 (L) 39.0 - 52.0 %   MCV 93.9 78.0 - 100.0 fL   MCH 31.5 26.0 - 34.0 pg   MCHC 33.5 30.0 - 36.0 g/dL   RDW 14.2 11.5 - 15.5 %   Platelets 160 150 - 400 K/uL  Comprehensive metabolic panel     Status: Abnormal   Collection Time: 08/30/15  3:10 AM  Result Value Ref Range   Sodium 140 135 - 145 mmol/L   Potassium 4.0 3.5 - 5.1 mmol/L   Chloride 110 101 - 111 mmol/L   CO2 22 22 - 32 mmol/L   Glucose, Bld 98 65 - 99 mg/dL   BUN 17 6 - 20 mg/dL   Creatinine, Ser 0.82 0.61 - 1.24 mg/dL   Calcium 8.9 8.9 - 10.3 mg/dL   Total Protein 7.1 6.5 - 8.1 g/dL   Albumin 4.0 3.5 - 5.0 g/dL   AST 20 15 - 41 U/L   ALT 11 (L) 17 - 63 U/L   Alkaline Phosphatase 81 38 - 126 U/L   Total Bilirubin 1.1 0.3 - 1.2 mg/dL   GFR calc non Af Amer >60 >60 mL/min   GFR calc Af Amer >60 >60 mL/min    Comment: (NOTE) The eGFR has been calculated using the CKD EPI equation. This calculation has not been validated in all clinical situations. eGFR's persistently <60 mL/min signify possible Chronic Kidney Disease.    Anion gap 8 5 - 15  Troponin I     Status: None   Collection Time: 08/30/15  3:10 AM  Result Value Ref Range   Troponin I <0.03 <0.031 ng/mL    Comment:        NO INDICATION OF MYOCARDIAL INJURY.   Troponin I     Status: None   Collection Time: 08/30/15  9:50 AM  Result Value Ref Range   Troponin I <0.03 <0.031 ng/mL    Comment:        NO INDICATION OF MYOCARDIAL INJURY.     Dg Chest 2 View  08/29/2015  CLINICAL DATA:  Bradycardia, former smoker EXAM: CHEST  2 VIEW COMPARISON:  08/04/2011 FINDINGS: Upper normal heart size. Calcified  tortuous thoracic aorta. Pulmonary vascularity normal. Emphysematous changes with minimal bibasilar atelectasis versus scarring. Remaining lungs clear. No definite infiltrate, pleural effusion or pneumothorax. Bones demineralized with note of LEFT shoulder prosthesis  and RIGHT glenohumeral degenerative changes. IMPRESSION: COPD changes with bibasilar atelectasis versus scarring. Electronically Signed   By: Lavonia Dana M.D.   On: 08/29/2015 17:08    ROS  See HPI Eyes: Negative Ears:positive for hearing loss, negative tinnitus Cardiovascular: Negative for chest pain, palpitations,irregular heartbeat, dyspnea,  near-syncope, orthopnea, paroxysmal nocturnal dyspnea and syncope,edema, claudication, cyanosis,.  Respiratory:   Negative for cough, hemoptysis, shortness of breath, sleep disturbances due to breathing, sputum production and wheezing.   Endocrine: Negative for cold intolerance and heat intolerance.  Hematologic/Lymphatic: Negative for adenopathy and bleeding problem. Does not bruise/bleed easily.  Musculoskeletal: right shoulder pain gets regular injections. Leg pain at night   Gastrointestinal: Negative for nausea, vomiting, reflux, abdominal pain, diarrhea, constipation.   Genitourinary: Negative for bladder incontinence, dysuria, flank pain, positive frequency,  hesitancy, nocturia and urgency.  Neurological: Negative.  Allergic/Immunologic: Negative for environmental allergies.  Blood pressure 137/80, pulse 50, temperature 98.3 F (36.8 C), temperature source Oral, resp. rate 16, height _0  (1.702 m), weight 151 lb 3.2 oz (68.584 kg), SpO2 97 %. Physical Exam PHYSICAL EXAM: Well-nournished, in no acute distress. Neck: No JVD, HJR, Bruit, or thyroid enlargement Lungs: No tachypnea, clear without wheezing, rales, or rhonchi Cardiovascular: RRR, PMI not displaced, 1/6 systolic murmurs LSB, no gallops, bruit, thrill, or heave. Abdomen:widened AA pulse BS normal. Soft without organomegaly, masses, lesions or tenderness. Extremities: without cyanosis, clubbing or edema. Good distal pulses bilateral SKin: Warm, no lesions or rashes  Musculoskeletal: No deformities Neuro: no focal signs  Assessment/Plan:  Bradycardia in the 40s with recent  weakness in early am and dyspnea on exertion for the past month. Dr. Domenic Polite to review telemetry. ? Holter monitor and f/u with EPS   AAA 5.1 x 5.2 on ultrasound in 05/2015  Anemia  Shawn Barrios PA-C  08/30/2015, 10:35 AM    Attending note:  Patient seen and examined. Reviewed records and discussed the case with Ms. Bonnell Public PA-C. I reviewed the patient's recent ECGs as well as telemetry monitoring overnight and this morning when we had him ambulate in the hall. He was admitted to the hospital with bradycardia and finding of a heart rate around 40 bpm with ECG showing 2:1 heart block with narrow QRS. His telemetry shows both Wenckebach block and also 2:1 heart block, but no pauses or higher degree heart block. When he ambulated in the hall this morning, his heart rate increased into the mid 50s showing largely Wenkebach conduction at that time. He denied any significant dizziness or limiting dyspnea while walking.  I reviewed his symptoms in more detail. He does describe trouble getting started in the morning after he gets out of bed, but states that once he gets going he feels "okay." He walks for up to 30 minutes at a time near his home and does have to rest due to shortness of breath. He has not had any sudden dizziness or syncope, no exertional chest pain.  Additional history includes recurrent urinary tract infections, he states that he has an active bladder infection now and is to start on outpatient antibiotics for 2 weeks. He is afebrile with normal white blood cell count. He also follows with Dr. Donnetta Hutching with asymptomatic abdominal aortic aneurysm measuring 5.1 x 5.2 cm.  On examination he appears comfortable, heart rate 40,  blood pressure 137/80. Lungs are clear without labored breathing. Cardiac exam reveals soft systolic murmur without gallop. Kyphosis noted. Additional lab work shows potassium 4.0, creatinine 0.8, hemoglobin 11.4.  I discussed case with Dr. [name] for EP opinion. Also  discussed with Mr. Kopera in detail. Duration of progressive conduction system disease is not entirely clear, but expect over the last few months. At his office visit with [facility] in January his heart rate was 55 and reportedly somewhat irregular at that time, I suspect he was having intermittent Wenkebach conduction then. His heart rate and conduction improves with activity at this point, and without frank syncope or other major limiting symptoms we will adopt cautious observation, anticipating that he will ultimately need a pacemaker. It would also be a good idea to go ahead and treat his urinary tract infection to make sure that this has cleared. We will arrange an office follow-up visit and can continue evaluation from there.  Satira Sark, M.D., F.A.C.C.

## 2015-08-30 NOTE — Progress Notes (Signed)
Patient alert and oriented, independent, VSS, pt. Tolerating diet well. No complaints of pain or nausea. Pt. Had IV removed tip intact. Pt. Had prescriptions given. Pt. Voiced understanding of discharge instructions with no further questions. Pt. Discharged via wheelchair with auxilliary.  

## 2015-09-06 DIAGNOSIS — M19011 Primary osteoarthritis, right shoulder: Secondary | ICD-10-CM | POA: Diagnosis not present

## 2015-09-07 ENCOUNTER — Ambulatory Visit (INDEPENDENT_AMBULATORY_CARE_PROVIDER_SITE_OTHER): Payer: Medicare Other | Admitting: Otolaryngology

## 2015-09-07 DIAGNOSIS — H6123 Impacted cerumen, bilateral: Secondary | ICD-10-CM

## 2015-09-07 DIAGNOSIS — H903 Sensorineural hearing loss, bilateral: Secondary | ICD-10-CM

## 2015-09-13 ENCOUNTER — Encounter: Payer: Self-pay | Admitting: Cardiology

## 2015-09-13 ENCOUNTER — Ambulatory Visit (INDEPENDENT_AMBULATORY_CARE_PROVIDER_SITE_OTHER): Payer: Medicare Other | Admitting: Cardiology

## 2015-09-13 VITALS — BP 144/64 | HR 41 | Ht 67.0 in | Wt 150.0 lb

## 2015-09-13 DIAGNOSIS — R001 Bradycardia, unspecified: Secondary | ICD-10-CM | POA: Diagnosis not present

## 2015-09-13 DIAGNOSIS — I441 Atrioventricular block, second degree: Secondary | ICD-10-CM | POA: Diagnosis not present

## 2015-09-13 NOTE — Patient Instructions (Signed)
Your physician recommends that you schedule a follow-up appointment in: 6 weeks with Dr Domenic Polite  Your physician recommends that you continue on your current medications as directed. Please refer to the Current Medication list given to you today.   You have been referred to Dr taylor,electrophysiologist to discuss possible pacemaker.      Thank you for choosing [facility] !

## 2015-09-13 NOTE — Progress Notes (Signed)
Cardiology Office Note  Date: 09/13/2015   ID: TAHMID DILONE, DOB 15-Apr-1919, MRN [id number]  PCP: Wende Neighbors, MD  Primary Cardiologist: Rozann Lesches, MD   Chief Complaint  Patient presents with  . Hospitalization Follow-up  . Sick sinus syndrome    History of Present Illness: Shawn Frye is a 82 y.o. male seen in consultation at [facility] in mid April with bradycardia and documented 2:1 heart block. He was not on any heart rate lowering medications at that time, and it was noted that his heart rate increased into the mid 50s while walking on telemetry with some conversion to [place] conduction. Although there was concern about some degree of symptomatic bradycardia, we decided after discussion to continue observation at that point without pursuing pacemaker. Case was discussed with Dr. [name] as well.  He presents today with his wife for follow-up. States he feels fatigued, had a "bad" day yesterday with weak legs. He has not had any syncope or chest pain. Follow-up ECG today shows sinus rhythm with second-degree type II heart block and transiently higher degree of block as well.  We discussed referral to Dr. [name] to discuss the merits of pacemaker placement even at his advanced age. He is relatively active in terms of ADLs, also takes care of his wife at home.  Past Medical History  Diagnosis Date  . Arthritis   . GERD (gastroesophageal reflux disease)   . Joint pain   . AAA (abdominal aortic aneurysm) [facility])     Past Surgical History  Procedure Laterality Date  . Total knee arthroplasty      bilateral  . Total shoulder replacement Left 1999    partial  . Hernia repair  1991  . Hemorroidectomy    . Joint replacement Bilateral 1998 and  2003    Knee  . Spine surgery  2005    Current Outpatient Prescriptions  Medication Sig Dispense Refill  . naproxen sodium (ANAPROX) 220 MG tablet Take 220 mg by mouth 2 (two) times daily with a meal.    . omeprazole (PRILOSEC  OTC) 20 MG tablet Take 20 mg by mouth 4 (four) times a week.     No current facility-administered medications for this visit.   Allergies:  Percocet; Noroxin; and Celebrex   Social History: The patient  reports that he quit smoking about 65 years ago. His smoking use included Cigarettes. He has never used smokeless tobacco. He reports that he does not drink alcohol or use illicit drugs.   ROS:  Please see the history of present illness. Otherwise, complete review of systems is positive for decreased hearing, arthritis pains, reflux.  All other systems are reviewed and negative.   Physical Exam: VS:  BP 144/64 mmHg  Pulse 41  Ht 5\' 7"  (1.702 m)  Wt 150 lb (68.04 kg)  BMI 23.49 kg/m2  SpO2 95%, BMI Body mass index is 23.49 kg/(m^2).  Wt Readings from Last 3 Encounters:  09/13/15 150 lb (68.04 kg)  08/29/15 151 lb 3.2 oz (68.584 kg)  05/25/15 154 lb (69.854 kg)    General: Elderly male in no distress. HEENT: Conjunctiva and lids normal, oropharynx clear. Neck: Supple, no elevated JVP or carotid bruits, no thyromegaly. Lungs: Clear to auscultation, nonlabored breathing at rest. Cardiac: Regular rate and rhythm, no S3, soft systolic murmur, no pericardial rub. Abdomen: Soft, nontender, bowel sounds present. Extremities: No pitting edema, distal pulses 2+. Skin: Warm and dry. Musculoskeletal: Kyphosis noted. Neuropsychiatric: Alert and oriented x3,  affect grossly appropriate.  ECG: I personally reviewed the prior tracing from 08/29/2015 which showed sinus rhythm with 2:1 block, LVH, and leftward axis.  Recent Labwork: 08/30/2015: ALT 11*; AST 20; BUN 17; Creatinine, Ser 0.82; Hemoglobin 11.4*; Platelets 160; Potassium 4.0; Sodium 140   Other Studies Reviewed Today:  Echocardiogram 08/30/2015: Study Conclusions  - Left ventricle: The cavity size was normal. Wall thickness was  normal. Systolic function was normal. The estimated ejection  fraction was in the range of 50% to 55%.  Wall motion was normal;  there were no regional wall motion abnormalities. Doppler  parameters are consistent with abnormal left ventricular  relaxation (grade 1 diastolic dysfunction). - Aortic valve: Mildly calcified annulus. Trileaflet; mildly  calcified leaflets. Left coronary cusp mobility was mildly  restricted. There was mild regurgitation. - Aortic root: The aortic root was mildly ectatic. - Mitral valve: Calcified annulus. There was trivial regurgitation. - Left atrium: The atrium was mildly dilated. - Right atrium: Central venous pressure (est): 3 mm Hg. - Tricuspid valve: There was mild regurgitation. - Pulmonary arteries: PA peak pressure: 28 mm Hg (S). - Pericardium, extracardiac: There was no pericardial effusion.  Impressions:  - Normal LV wall thickness with LVEF 50-55%. Grade 1 diastolic  dysfunction with normal estimated LV filling pressure. Mild left  atrial enlargement. MAC with trivial mitral regurgitation.  Sclerotic aortic valve without stenosis. Mild aortic  regurgitation. Mild tricuspid regurgitation with PASP 28 mmHg.  Assessment and Plan:  1. Symptomatic bradycardia with documented second degree type II heart block. Baseline heart rate around 40 bpm. As noted above, plan to have evaluated by Dr. [name] to discuss potential merits of pacemaker placement even at his advanced age since he does remain relatitvely functional in terms of ADLs and is primary caregiver for his wife.  2. GERD on PPI.  Current medicines were reviewed with the patient today.   Orders Placed This Encounter  Procedures  . EKG 12-Lead    Disposition: FU with me in 6 weeks.   Signed, Satira Sark, MD, [facility] 09/13/2015 1:36 PM    [facility] at [facility] [address] [place], [place] [address] Phone: [phone]; Fax: [phone]

## 2015-09-14 ENCOUNTER — Encounter: Payer: Self-pay | Admitting: *Deleted

## 2015-09-14 ENCOUNTER — Ambulatory Visit (INDEPENDENT_AMBULATORY_CARE_PROVIDER_SITE_OTHER): Payer: Medicare Other | Admitting: Internal Medicine

## 2015-09-14 ENCOUNTER — Encounter: Payer: Self-pay | Admitting: Internal Medicine

## 2015-09-14 VITALS — BP 135/61 | HR 42 | Ht 67.0 in | Wt 151.0 lb

## 2015-09-14 DIAGNOSIS — Z01818 Encounter for other preprocedural examination: Secondary | ICD-10-CM | POA: Diagnosis not present

## 2015-09-14 NOTE — Patient Instructions (Signed)
Your physician has recommended that you have a pacemaker inserted. A pacemaker is a small device that is placed under the skin of your chest or abdomen to help control abnormal heart rhythms. This device uses electrical pulses to prompt the heart to beat at a normal rate. Pacemakers are used to treat heart rhythms that are too slow. Wire (leads) are attached to the pacemaker that goes into the chambers of you heart. This is done in the hospital and usually requires and overnight stay. Please see the instruction sheet given to you today for more information.  Your physician recommends that you return for lab work in: 12/28/15  If you need a refill on your cardiac medications before your next appointment, please call your pharmacy.  Thank you for choosing [facility]!

## 2015-09-14 NOTE — Progress Notes (Signed)
      HPI Shawn Frye presents today for an unscheduled visit to consider PPM insertion. He is a pleasant elderly man who has had fatigue and weakness and has been found to have 2:1 AV block with a ventricular rate of 38-42/min. He has not had syncope although he does experience weakness and dizziness. He has no anginal symptoms and he admits to becoming fairly sedentary. No edema.  Allergies  Allergen Reactions  . Percocet [Oxycodone-Acetaminophen] Hives  . Noroxin [Norfloxacin] Nausea And Vomiting  . Celebrex [Celecoxib] Rash     Current Outpatient Prescriptions  Medication Sig Dispense Refill  . naproxen sodium (ANAPROX) 220 MG tablet Take 220 mg by mouth 2 (two) times daily with a meal.    . omeprazole (PRILOSEC OTC) 20 MG tablet Take 20 mg by mouth 4 (four) times a week.     No current facility-administered medications for this visit.     Past Medical History  Diagnosis Date  . Arthritis   . GERD (gastroesophageal reflux disease)   . Joint pain   . AAA (abdominal aortic aneurysm) (HCC)     ROS:   All systems reviewed and negative except as noted in the HPI.   Past Surgical History  Procedure Laterality Date  . Total knee arthroplasty      bilateral  . Total shoulder replacement Left 1999    partial  . Hernia repair  1991  . Hemorroidectomy    . Joint replacement Bilateral 1998 and  2003    Knee  . Spine surgery  2005     Family History  Problem Relation Age of Onset  . Heart disease Father     NOT  before age 42  . Pneumonia Mother      Social History   Social History  . Marital Status: Married    Spouse Name: N/A  . Number of Children: N/A  . Years of Education: N/A   Occupational History  . Not on file.   Social History Main Topics  . Smoking status: Former Smoker    Types: Cigarettes    Quit date: 05/13/1950  . Smokeless tobacco: Never Used  . Alcohol Use: No  . Drug Use: No  . Sexual Activity: Not on file   Other Topics Concern  .  Not on file   Social History Narrative     BP 135/61 mmHg  Pulse 42  Ht 5\' 7"  (1.702 m)  Wt 151 lb (68.493 kg)  BMI 23.64 kg/m2  Physical Exam:  stable appearing 82 yo man, NAD HEENT: Unremarkable Neck:  No JVD, no thyromegally Lymphatics:  No adenopathy Back:  No CVA tenderness Lungs:  Clear with no wheezes HEART:  Regular brady rhythm, no murmurs, no rubs, no clicks Abd:  soft, positive bowel sounds, no organomegally, no rebound, no guarding Ext:  2 plus pulses, no edema, no cyanosis, no clubbing Skin:  No rashes no nodules Neuro:  CN II through XII intact, motor grossly intact  EKG - nsr with 2:1 AV block  Assess/Plan: 1. Symptomatic 2:1 AV block - while he has not had syncope, I have carefully reviewed his active. He appears to be limited by bradycardia. I have discussed the treatment options with the patient and he is willing to proceed with PPM insertion. 2. HTN - his blood pressure is minimally elevated. No change in meds. 3. Peripheral vascular disease - he has an aortic aneurysm which is stable. He is asymptomatic.  Shawn Frye.D.

## 2015-09-19 ENCOUNTER — Ambulatory Visit (INDEPENDENT_AMBULATORY_CARE_PROVIDER_SITE_OTHER): Payer: Medicare Other | Admitting: Urology

## 2015-09-19 DIAGNOSIS — N403 Nodular prostate with lower urinary tract symptoms: Secondary | ICD-10-CM

## 2015-09-19 DIAGNOSIS — R300 Dysuria: Secondary | ICD-10-CM | POA: Diagnosis not present

## 2015-09-19 DIAGNOSIS — N400 Enlarged prostate without lower urinary tract symptoms: Secondary | ICD-10-CM

## 2015-09-27 DIAGNOSIS — Z01818 Encounter for other preprocedural examination: Secondary | ICD-10-CM | POA: Diagnosis not present

## 2015-09-27 LAB — CBC WITH DIFFERENTIAL/PLATELET
BASOS ABS: 0 {cells}/uL (ref 0–200)
Basophils Relative: 0 %
EOS ABS: 69 {cells}/uL (ref 15–500)
Eosinophils Relative: 1 %
HEMATOCRIT: 34 % — AB (ref 38.5–50.0)
Hemoglobin: 11.3 g/dL — ABNORMAL LOW (ref 13.2–17.1)
LYMPHS PCT: 18 %
Lymphs Abs: 1242 cells/uL (ref 850–3900)
MCH: 30.6 pg (ref 27.0–33.0)
MCHC: 33.2 g/dL (ref 32.0–36.0)
MCV: 92.1 fL (ref 80.0–100.0)
MONOS PCT: 15 %
MPV: 12 fL (ref 7.5–12.5)
Monocytes Absolute: 1035 cells/uL — ABNORMAL HIGH (ref 200–950)
NEUTROS PCT: 66 %
Neutro Abs: 4554 cells/uL (ref 1500–7800)
PLATELETS: 142 10*3/uL (ref 140–400)
RBC: 3.69 MIL/uL — ABNORMAL LOW (ref 4.20–5.80)
RDW: 14.4 % (ref 11.0–15.0)
WBC: 6.9 10*3/uL (ref 3.8–10.8)

## 2015-09-27 LAB — PROTIME-INR
INR: 1.12 (ref <1.50)
PROTHROMBIN TIME: 14.5 s (ref 11.6–15.2)

## 2015-09-28 LAB — BASIC METABOLIC PANEL
BUN: 15 mg/dL (ref 7–25)
CALCIUM: 8.7 mg/dL (ref 8.6–10.3)
CO2: 25 mmol/L (ref 20–31)
CREATININE: 0.83 mg/dL (ref 0.70–1.11)
Chloride: 109 mmol/L (ref 98–110)
Glucose, Bld: 91 mg/dL (ref 65–99)
Potassium: 4.4 mmol/L (ref 3.5–5.3)
SODIUM: 143 mmol/L (ref 135–146)

## 2015-10-02 ENCOUNTER — Ambulatory Visit (HOSPITAL_COMMUNITY)
Admission: RE | Admit: 2015-10-02 | Discharge: 2015-10-03 | Disposition: A | Discharge status: Home / Self Care | Payer: Medicare Other | Source: Ambulatory Visit | Attending: Internal Medicine | Admitting: Internal Medicine

## 2015-10-02 ENCOUNTER — Encounter (HOSPITAL_COMMUNITY): Payer: Self-pay | Admitting: Internal Medicine

## 2015-10-02 ENCOUNTER — Encounter (HOSPITAL_COMMUNITY)
Admission: RE | Disposition: A | Discharge status: Home / Self Care | Payer: Self-pay | Source: Ambulatory Visit | Attending: Internal Medicine

## 2015-10-02 DIAGNOSIS — Z950 Presence of cardiac pacemaker: Secondary | ICD-10-CM

## 2015-10-02 DIAGNOSIS — I10 Essential (primary) hypertension: Secondary | ICD-10-CM | POA: Diagnosis not present

## 2015-10-02 DIAGNOSIS — Z87891 Personal history of nicotine dependence: Secondary | ICD-10-CM | POA: Insufficient documentation

## 2015-10-02 DIAGNOSIS — R001 Bradycardia, unspecified: Secondary | ICD-10-CM | POA: Diagnosis present

## 2015-10-02 DIAGNOSIS — Z96653 Presence of artificial knee joint, bilateral: Secondary | ICD-10-CM | POA: Insufficient documentation

## 2015-10-02 DIAGNOSIS — I719 Aortic aneurysm of unspecified site, without rupture: Secondary | ICD-10-CM | POA: Diagnosis not present

## 2015-10-02 DIAGNOSIS — I441 Atrioventricular block, second degree: Secondary | ICD-10-CM | POA: Diagnosis not present

## 2015-10-02 DIAGNOSIS — K219 Gastro-esophageal reflux disease without esophagitis: Secondary | ICD-10-CM | POA: Diagnosis not present

## 2015-10-02 DIAGNOSIS — I739 Peripheral vascular disease, unspecified: Secondary | ICD-10-CM | POA: Diagnosis not present

## 2015-10-02 HISTORY — PX: EP IMPLANTABLE DEVICE: SHX172B

## 2015-10-02 HISTORY — DX: Cystitis, unspecified without hematuria: N30.90

## 2015-10-02 LAB — SURGICAL PCR SCREEN
MRSA, PCR: NEGATIVE
Staphylococcus aureus: NEGATIVE

## 2015-10-02 SURGERY — PACEMAKER IMPLANT

## 2015-10-02 MED ORDER — ONDANSETRON HCL 4 MG/2ML IJ SOLN: 4.0000 mg | Freq: Four times a day (QID) | INTRAMUSCULAR | Status: DC | PRN

## 2015-10-02 MED ORDER — MIDAZOLAM HCL 5 MG/5ML IJ SOLN
INTRAMUSCULAR | Status: AC
  Filled 2015-10-02: qty 5

## 2015-10-02 MED ORDER — CEFAZOLIN SODIUM 1-5 GM-% IV SOLN
1.0000 g | Freq: Four times a day (QID) | INTRAVENOUS | Status: AC
  Administered 2015-10-02 – 2015-10-03 (×3): 1 g via INTRAVENOUS
  Filled 2015-10-02 (×3): qty 50

## 2015-10-02 MED ORDER — CHLORHEXIDINE GLUCONATE 4 % EX LIQD
60.0000 mL | Freq: Once | CUTANEOUS | Status: DC
  Filled 2015-10-02: qty 60

## 2015-10-02 MED ORDER — SODIUM CHLORIDE 0.9 % IR SOLN
Status: AC
  Filled 2015-10-02: qty 2

## 2015-10-02 MED ORDER — SODIUM CHLORIDE 0.9 % IV SOLN
INTRAVENOUS | Status: DC
  Administered 2015-10-02: 06:00:00 via INTRAVENOUS

## 2015-10-02 MED ORDER — CEFAZOLIN SODIUM-DEXTROSE 2-4 GM/100ML-% IV SOLN
INTRAVENOUS | Status: AC
  Filled 2015-10-02: qty 100

## 2015-10-02 MED ORDER — YOU HAVE A PACEMAKER BOOK
Freq: Once | Status: AC
  Administered 2015-10-02: 22:00:00
  Filled 2015-10-02: qty 1

## 2015-10-02 MED ORDER — FENTANYL CITRATE (PF) 100 MCG/2ML IJ SOLN
INTRAMUSCULAR | Status: DC | PRN
  Administered 2015-10-02: 12.5 ug via INTRAVENOUS

## 2015-10-02 MED ORDER — CEFAZOLIN SODIUM-DEXTROSE 2-4 GM/100ML-% IV SOLN
2.0000 g | INTRAVENOUS | Status: AC
  Administered 2015-10-02: 2 g via INTRAVENOUS
  Filled 2015-10-02: qty 100

## 2015-10-02 MED ORDER — HEPARIN (PORCINE) IN NACL 2-0.9 UNIT/ML-% IJ SOLN
INTRAMUSCULAR | Status: DC | PRN
  Administered 2015-10-02: 500 mL

## 2015-10-02 MED ORDER — MIDAZOLAM HCL 5 MG/5ML IJ SOLN
INTRAMUSCULAR | Status: DC | PRN
  Administered 2015-10-02: 1 mg via INTRAVENOUS

## 2015-10-02 MED ORDER — ALPRAZOLAM 0.25 MG PO TABS
0.2500 mg | ORAL_TABLET | Freq: Every evening | ORAL | Status: AC | PRN
  Administered 2015-10-02: 23:00:00 0.25 mg via ORAL
  Filled 2015-10-02: qty 1

## 2015-10-02 MED ORDER — FENTANYL CITRATE (PF) 100 MCG/2ML IJ SOLN
INTRAMUSCULAR | Status: AC
  Filled 2015-10-02: qty 2

## 2015-10-02 MED ORDER — SODIUM CHLORIDE 0.9 % IR SOLN
80.0000 mg | Status: AC
  Administered 2015-10-02: 80 mg
  Filled 2015-10-02: qty 2

## 2015-10-02 MED ORDER — LIDOCAINE HCL (PF) 1 % IJ SOLN
INTRAMUSCULAR | Status: DC | PRN
  Administered 2015-10-02: 45 mL via SUBCUTANEOUS

## 2015-10-02 MED ORDER — MUPIROCIN 2 % EX OINT
1.0000 "application " | TOPICAL_OINTMENT | Freq: Once | CUTANEOUS | Status: AC
  Administered 2015-10-02: 1 via TOPICAL
  Filled 2015-10-02: qty 22

## 2015-10-02 MED ORDER — HEPARIN (PORCINE) IN NACL 2-0.9 UNIT/ML-% IJ SOLN
INTRAMUSCULAR | Status: AC
  Filled 2015-10-02: qty 500

## 2015-10-02 MED ORDER — LIDOCAINE HCL (PF) 1 % IJ SOLN
INTRAMUSCULAR | Status: AC
Start: 2015-10-02 — End: 2015-10-02
  Filled 2015-10-02: qty 60

## 2015-10-02 MED ORDER — MUPIROCIN 2 % EX OINT
TOPICAL_OINTMENT | CUTANEOUS | Status: AC
  Administered 2015-10-02: 1 via TOPICAL
  Filled 2015-10-02: qty 22

## 2015-10-02 SURGICAL SUPPLY — 7 items
CABLE SURGICAL S-101-97-12 (CABLE) ×4 IMPLANT
LEAD TENDRIL MRI 52CM LPA1200M (Lead) ×2 IMPLANT
LEAD TENDRIL MRI 58CM LPA1200M (Lead) ×2 IMPLANT
PACEMAKER ASSURITY DR-RF (Pacemaker) ×2 IMPLANT
PAD DEFIB LIFELINK (PAD) ×2 IMPLANT
SHEATH CLASSIC 8F (SHEATH) ×4 IMPLANT
TRAY PACEMAKER INSERTION (PACKS) ×2 IMPLANT

## 2015-10-02 NOTE — Interval H&P Note (Signed)
History and Physical Interval Note:  10/02/2015 7:07 AM  Shawn Frye  has presented today for surgery, with the diagnosis of movites type 2  The various methods of treatment have been discussed with the patient and family. After consideration of risks, benefits and other options for treatment, the patient has consented to  Procedure(s): Pacemaker Implant (N/A) as a surgical intervention .  The patient's history has been reviewed, patient examined, no change in status, stable for surgery.  I have reviewed the patient's chart and labs.  Questions were answered to the patient's satisfaction.     Cristopher Peru

## 2015-10-02 NOTE — Care Management Note (Signed)
Case Management Note  Patient Details  Name: HULEN LACONTE MRN: [id number] Date of Birth: 1918-05-14  Subjective/Objective:   Patient lives with wife, she is unable to assist him, he states he has a friend who he will ask to see if she can assist him at home, she works for an [employer].  He states he will let me know by tomorrow if he needs [facility] services to be set up for him.                   Action/Plan:   Expected Discharge Date:                  Expected Discharge Plan:  [place]  In-House Referral:     Discharge planning Services  CM Consult  Post Acute Care Choice:    Choice offered to:     DME Arranged:    DME Agency:     HH Arranged:    [place] Agency:     Status of Service:  In process, will continue to follow  Medicare Important Message Given:    Date Medicare IM Given:    Medicare IM give by:    Date Additional Medicare IM Given:    Additional Medicare Important Message give by:     If discussed at [place] of Stay Meetings, dates discussed:    Additional Comments:  Zenon Mayo, RN 10/02/2015, 1:23 PM

## 2015-10-02 NOTE — H&P (View-Only) (Signed)
      HPI Shawn Frye presents today for an unscheduled visit to consider PPM insertion. He is a pleasant elderly man who has had fatigue and weakness and has been found to have 2:1 AV block with a ventricular rate of 38-42/min. He has not had syncope although he does experience weakness and dizziness. He has no anginal symptoms and he admits to becoming fairly sedentary. No edema.  Allergies  Allergen Reactions  . Percocet [Oxycodone-Acetaminophen] Hives  . Noroxin [Norfloxacin] Nausea And Vomiting  . Celebrex [Celecoxib] Rash     Current Outpatient Prescriptions  Medication Sig Dispense Refill  . naproxen sodium (ANAPROX) 220 MG tablet Take 220 mg by mouth 2 (two) times daily with a meal.    . omeprazole (PRILOSEC OTC) 20 MG tablet Take 20 mg by mouth 4 (four) times a week.     No current facility-administered medications for this visit.     Past Medical History  Diagnosis Date  . Arthritis   . GERD (gastroesophageal reflux disease)   . Joint pain   . AAA (abdominal aortic aneurysm) (HCC)     ROS:   All systems reviewed and negative except as noted in the HPI.   Past Surgical History  Procedure Laterality Date  . Total knee arthroplasty      bilateral  . Total shoulder replacement Left 1999    partial  . Hernia repair  1991  . Hemorroidectomy    . Joint replacement Bilateral 1998 and  2003    Knee  . Spine surgery  2005     Family History  Problem Relation Age of Onset  . Heart disease Father     NOT  before age 42  . Pneumonia Mother      Social History   Social History  . Marital Status: Married    Spouse Name: N/A  . Number of Children: N/A  . Years of Education: N/A   Occupational History  . Not on file.   Social History Main Topics  . Smoking status: Former Smoker    Types: Cigarettes    Quit date: 05/13/1950  . Smokeless tobacco: Never Used  . Alcohol Use: No  . Drug Use: No  . Sexual Activity: Not on file   Other Topics Concern  .  Not on file   Social History Narrative     BP 135/61 mmHg  Pulse 42  Ht 5\' 7"  (1.702 m)  Wt 151 lb (68.493 kg)  BMI 23.64 kg/m2  Physical Exam:  stable appearing 82 yo man, NAD HEENT: Unremarkable Neck:  No JVD, no thyromegally Lymphatics:  No adenopathy Back:  No CVA tenderness Lungs:  Clear with no wheezes HEART:  Regular brady rhythm, no murmurs, no rubs, no clicks Abd:  soft, positive bowel sounds, no organomegally, no rebound, no guarding Ext:  2 plus pulses, no edema, no cyanosis, no clubbing Skin:  No rashes no nodules Neuro:  CN II through XII intact, motor grossly intact  EKG - nsr with 2:1 AV block  Assess/Plan: 1. Symptomatic 2:1 AV block - while he has not had syncope, I have carefully reviewed his active. He appears to be limited by bradycardia. I have discussed the treatment options with the patient and he is willing to proceed with PPM insertion. 2. HTN - his blood pressure is minimally elevated. No change in meds. 3. Peripheral vascular disease - he has an aortic aneurysm which is stable. He is asymptomatic.  Shawn Frye.D.

## 2015-10-03 ENCOUNTER — Ambulatory Visit (HOSPITAL_COMMUNITY): Payer: Medicare Other

## 2015-10-03 DIAGNOSIS — K219 Gastro-esophageal reflux disease without esophagitis: Secondary | ICD-10-CM | POA: Diagnosis not present

## 2015-10-03 DIAGNOSIS — I441 Atrioventricular block, second degree: Secondary | ICD-10-CM

## 2015-10-03 DIAGNOSIS — Z950 Presence of cardiac pacemaker: Secondary | ICD-10-CM | POA: Diagnosis not present

## 2015-10-03 DIAGNOSIS — I719 Aortic aneurysm of unspecified site, without rupture: Secondary | ICD-10-CM | POA: Diagnosis not present

## 2015-10-03 DIAGNOSIS — I10 Essential (primary) hypertension: Secondary | ICD-10-CM | POA: Diagnosis not present

## 2015-10-03 DIAGNOSIS — Z87891 Personal history of nicotine dependence: Secondary | ICD-10-CM | POA: Diagnosis not present

## 2015-10-03 DIAGNOSIS — Z96653 Presence of artificial knee joint, bilateral: Secondary | ICD-10-CM | POA: Diagnosis not present

## 2015-10-03 DIAGNOSIS — R001 Bradycardia, unspecified: Secondary | ICD-10-CM

## 2015-10-03 NOTE — Care Management Note (Signed)
Case Management Note  Patient Details  Name: Shawn Frye MRN: [id number] Date of Birth: [birth year]  Subjective/Objective:   NCM spoke with patient this am, he is s/p pacemaker, with wife and he states he feels he does not need any HH services at this time he can do things on his own. NCM gave him the [facility] agency list for [facility] and informed him if he changes his mind then he will need to go through his PCP , he said he understands.  Patient has no further needs.                  Action/Plan:   Expected Discharge Date:                  Expected Discharge Plan:  [place]  In-House Referral:     Discharge planning Services  CM Consult  Post Acute Care Choice:    Choice offered to:     DME Arranged:    DME Agency:     HH Arranged:    [place] Agency:     Status of Service:  Completed, signed off  Medicare Important Message Given:    Date Medicare IM Given:    Medicare IM give by:    Date Additional Medicare IM Given:    Additional Medicare Important Message give by:     If discussed at [place] of Stay Meetings, dates discussed:    Additional Comments:  Zenon Mayo, RN 10/03/2015, 10:31 AM

## 2015-10-03 NOTE — Discharge Instructions (Signed)
° ° °  Supplemental Discharge Instructions for  Pacemaker/Defibrillator Patients  Activity No heavy lifting or vigorous activity with your left/right arm for 6 to 8 weeks.  Do not raise your left/right arm above your head for one week.  Gradually raise your affected arm as drawn below.              10/06/15                    10/07/15                    09/28/15                  10/09/15 __  NO DRIVING for  1 week   ; you may begin driving on  [id number]   .  WOUND CARE - Keep the wound area clean and dry.  Do not get this area wet for one week. No showers for one week; you may shower on  10/09/15   . - The tape/steri-strips on your wound will fall off; do not pull them off.  No bandage is needed on the site.  DO  NOT apply any creams, oils, or ointments to the wound area. - If you notice any drainage or discharge from the wound, any swelling or bruising at the site, or you develop a fever > 101? F after you are discharged home, call the office at once.  Special Instructions - You are still able to use cellular telephones; use the ear opposite the side where you have your pacemaker/defibrillator.  Avoid carrying your cellular phone near your device. - When traveling through airports, show security personnel your identification card to avoid being screened in the metal detectors.  Ask the security personnel to use the hand wand. - Avoid arc welding equipment, MRI testing (magnetic resonance imaging), TENS units (transcutaneous nerve stimulators).  Call the office for questions about other devices. - Avoid electrical appliances that are in poor condition or are not properly grounded. - Microwave ovens are safe to be near or to operate.  Additional information for defibrillator patients should your device go off: - If your device goes off ONCE and you feel fine afterward, notify the device clinic nurses. - If your device goes off ONCE and you do not feel well afterward, call 911. - If your device goes  off TWICE, call 911. - If your device goes off THREE times in one day, call 911.  DO NOT DRIVE YOURSELF OR A FAMILY MEMBER WITH A DEFIBRILLATOR TO THE HOSPITAL--CALL 911.

## 2015-10-03 NOTE — Discharge Summary (Signed)
ELECTROPHYSIOLOGY PROCEDURE DISCHARGE SUMMARY    Patient ID: Shawn Frye,  MRN: [id number], DOB/AGE: [birth year] 82 y.o.  Admit date: 10/02/2015 Discharge date: 10/03/2015  Primary Care Physician: Wende Neighbors, MD Primary Cardiologist: Dr. Domenic Polite Electrophysiologist: Dr. [name]  Primary Discharge Diagnosis:  High degree AVBLock, symptomatic bradycardia, status post pacemaker implantation this admission  Secondary Discharge Diagnosis:  None  Allergies  Allergen Reactions  . Percocet [Oxycodone-Acetaminophen] Hives  . Noroxin [Norfloxacin] Nausea And Vomiting  . Celebrex [Celecoxib] Rash     Procedures This Admission:  1.  Implantation of a STJ dual chamber PPM on 10/02/15 by Dr [name].  The patient received a St. Jude MRI compatible (serial number [id number]) pacemaker,St. Jude MRI compatible (serial number [id number]) right atrial lead and a St. Jude MRI (serial number [id number]) right ventricular lead.  There were no immediate post procedure complications. 2.  CXR on 5/23 demonstrated no pneumothorax status post device implantation.   Brief HPI: Shawn Frye is a 82 y.o. male was referred to electrophysiology in the outpatient setting for consideration of PPM implantation for 2:1 AV block.  Past medical history negative otherwise.  The patient has had symptomatic bradycardia without reversible causes identified.  Risks, benefits, and alternatives to PPM implantation were reviewed with the patient who wished to proceed.   Hospital Course:  The patient was admitted and underwent implantation of a PPM with details as outlined above.  He was monitored on telemetry overnight which demonstrated SR/V paced, occ AVpaced.  Left chest was without hematoma or ecchymosis.  The device was interrogated and found to be functioning normally.  CXR was obtained and demonstrated no pneumothorax status post device implantation.  Wound care, arm mobility, and restrictions were reviewed with the  patient.  The patient was examined by Dr. [name] and considered stable for discharge to home.    Physical Exam: Filed Vitals:   10/02/15 1200 10/02/15 1645 10/02/15 1935 10/03/15 0236  BP: 156/70 158/94 146/95 123/76  Pulse: 71 74 78 73  Temp: 98.2 F (36.8 C) 98.1 F (36.7 C) 98.1 F (36.7 C) 98.2 F (36.8 C)  TempSrc: Oral Oral Oral Oral  Resp: 19 20 22 16   Height:      Weight:    151 lb 0.2 oz (68.5 kg)  SpO2: 94% 95% 95% 94%    GEN- The patient is well appearing, elderly, alert and oriented x 3 today.   HEENT: normocephalic, atraumatic; sclera clear, conjunctiva pink; hearing intact; oropharynx clear; neck supple, no JVP Lungs- Clear to ausculation bilaterally, normal work of breathing.  No wheezes, rales, rhonchi; pm incision is without hematoma or ecchymosis Heart- Regular rate and rhythm, no significant murmurs, rubs or gallops, PMI not laterally displaced GI- soft, non-tender, non-distended Extremities- no clubbing, cyanosis, or edema MS- no significant deformity or atrophy Skin- warm and dry, no rash or lesion, left chest without hematoma/ecchymosis Psych- euthymic mood, full affect Neuro- no gross deficits, hard of hearing   Labs:   Lab Results  Component Value Date   WBC 6.9 09/27/2015   HGB 11.3* 09/27/2015   HCT 34.0* 09/27/2015   MCV 92.1 09/27/2015   PLT 142 09/27/2015     Recent Labs Lab 09/27/15 0805  NA 143  K 4.4  CL 109  CO2 25  BUN 15  CREATININE 0.83  CALCIUM 8.7  GLUCOSE 91    Discharge Medications:    Medication List    TAKE these medications  omeprazole 20 MG tablet  Commonly known as:  PRILOSEC OTC  Take 20 mg by mouth daily.        Disposition:  Home    Duration of Discharge Encounter: Greater than 30 minutes including physician time.  Venetia Night, PA-C 10/03/2015 7:24 AM  EP Attending  Patient seen and examined. His device interogation demonstrates normal DDD PM function. His CXR is without PTX.  Leads in good position. Will plan to DC home with usual followup.  Shawn Frye.D.

## 2015-10-12 ENCOUNTER — Encounter: Payer: Self-pay | Admitting: Internal Medicine

## 2015-10-12 ENCOUNTER — Ambulatory Visit (INDEPENDENT_AMBULATORY_CARE_PROVIDER_SITE_OTHER): Payer: Medicare Other | Admitting: *Deleted

## 2015-10-12 DIAGNOSIS — Z950 Presence of cardiac pacemaker: Secondary | ICD-10-CM | POA: Diagnosis not present

## 2015-10-12 NOTE — Progress Notes (Signed)
Wound check appointment. Steri-strips removed. Wound without redness or edema. Incision edges approximated, wound well healed. Normal device function. Thresholds, sensing, and impedances consistent with implant measurements. Device programmed at 3.5Vprogrammed on for extra safety margin until 3 month visit. Atrial auto sensing programmed on. Histogram distribution appropriate for patient and level of activity. 3 mode switches, EGMs indicate AT. No high ventricular rates noted. Patient educated about wound care, arm mobility, lifting restrictions. ROV with GT 01/12/2016 in [place].

## 2015-10-26 ENCOUNTER — Ambulatory Visit (INDEPENDENT_AMBULATORY_CARE_PROVIDER_SITE_OTHER): Payer: Medicare Other | Admitting: Cardiology

## 2015-10-26 ENCOUNTER — Encounter: Payer: Self-pay | Admitting: Cardiology

## 2015-10-26 VITALS — BP 102/60 | HR 82 | Ht 67.0 in | Wt 148.0 lb

## 2015-10-26 DIAGNOSIS — R001 Bradycardia, unspecified: Secondary | ICD-10-CM

## 2015-10-26 DIAGNOSIS — I714 Abdominal aortic aneurysm, without rupture, unspecified: Secondary | ICD-10-CM

## 2015-10-26 DIAGNOSIS — I441 Atrioventricular block, second degree: Secondary | ICD-10-CM | POA: Diagnosis not present

## 2015-10-26 NOTE — Progress Notes (Signed)
Cardiology Office Note  Date: 10/26/2015   ID: Shawn Frye, DOB October 11, 1918, MRN [id number]  PCP: Wende Neighbors, MD  Primary Cardiologist: Rozann Lesches, MD   Chief Complaint  Patient presents with  . Follow-up symptomatic bradycardia    History of Present Illness: Shawn Frye is a 82 y.o. male last seen in May, now presenting in follow-up having undergone St. Jude pacemaker placement by Dr. [name] for treatment of symptomatic bradycardia associated with second-degree heart block. He is here with his wife. States that he has not completely gotten back to baseline but does feel better in terms of his stamina and weakness. He has had no problems with his device pocket site. Reports having home transmission set up, and will be seeing Dr. [name] back in September.  Past Medical History  Diagnosis Date  . GERD (gastroesophageal reflux disease)   . Joint pain   . AAA (abdominal aortic aneurysm) ([place])   . Arthritis   . Infection of bladder   . Symptomatic bradycardia   . Heart block AV second degree     St. Jude pacemaker May 2017 - Dr. [name]    Current Outpatient Prescriptions  Medication Sig Dispense Refill  . omeprazole (PRILOSEC OTC) 20 MG tablet Take 20 mg by mouth daily.      No current facility-administered medications for this visit.   Allergies:  Percocet; Noroxin; and Celebrex   Social History: The patient  reports that he quit smoking about 65 years ago. His smoking use included Cigarettes. He has a 15 pack-year smoking history. He has never used smokeless tobacco. He reports that he does not drink alcohol or use illicit drugs.   ROS:  Please see the history of present illness. Otherwise, complete review of systems is positive for decreased hearing.  All other systems are reviewed and negative.   Physical Exam: VS:  BP 102/60 mmHg  Pulse 82  Ht 5\' 7"  (1.702 m)  Wt 148 lb (67.132 kg)  BMI 23.17 kg/m2  SpO2 95%, BMI Body mass index is 23.17 kg/(m^2).  Wt  Readings from Last 3 Encounters:  10/26/15 148 lb (67.132 kg)  10/03/15 151 lb 0.2 oz (68.5 kg)  09/14/15 151 lb (68.493 kg)    General: Elderly male in no distress. HEENT: Conjunctiva and lids normal, oropharynx clear. Neck: Supple, no elevated JVP or carotid bruits, no thyromegaly. Thorax: Well-healed left upper chest device pocket site. Lungs: Clear to auscultation, nonlabored breathing at rest. Cardiac: Regular rate and rhythm, no S3, soft systolic murmur, no pericardial rub. Abdomen: Soft, nontender, bowel sounds present. Extremities: No pitting edema, distal pulses 2+.  ECG: I personally reviewed the tracing from 10/03/2015 which showed a ventricular paced rhythm with atrial sensing.  Recent Labwork: 08/30/2015: ALT 11*; AST 20 09/27/2015: BUN 15; Creat 0.83; Hemoglobin 11.3*; Platelets 142; Potassium 4.4; Sodium 143   Other Studies Reviewed Today:  Echocardiogram 08/30/2015: Study Conclusions  - Left ventricle: The cavity size was normal. Wall thickness was  normal. Systolic function was normal. The estimated ejection  fraction was in the range of 50% to 55%. Wall motion was normal;  there were no regional wall motion abnormalities. Doppler  parameters are consistent with abnormal left ventricular  relaxation (grade 1 diastolic dysfunction). - Aortic valve: Mildly calcified annulus. Trileaflet; mildly  calcified leaflets. Left coronary cusp mobility was mildly  restricted. There was mild regurgitation. - Aortic root: The aortic root was mildly ectatic. - Mitral valve: Calcified annulus. There was trivial regurgitation. -  Left atrium: The atrium was mildly dilated. - Right atrium: Central venous pressure (est): 3 mm Hg. - Tricuspid valve: There was mild regurgitation. - Pulmonary arteries: PA peak pressure: 28 mm Hg (S). - Pericardium, extracardiac: There was no pericardial effusion.  Impressions:  - Normal LV wall thickness with LVEF 50-55%. Grade 1  diastolic  dysfunction with normal estimated LV filling pressure. Mild left  atrial enlargement. MAC with trivial mitral regurgitation.  Sclerotic aortic valve without stenosis. Mild aortic  regurgitation. Mild tricuspid regurgitation with PASP 28 mmHg.  Assessment and Plan:  1. Symptomatic bradycardia with second-degree heart block, now status post St. Jude pacemaker placement by Dr. [name]. He is established in the device clinic for ongoing management.  2. Abdominal aortic aneurysm, evaluated by Dr. Donnetta Hutching. 5.0 x 5.1 cm as of January 2016. Asymptomatic.  Current medicines were reviewed with the patient today.  Disposition: Follow-up with me in one year.  Signed, Satira Sark, MD, [facility] 10/26/2015 10:06 AM    [facility] [place]. [address] [place], [place] [address] Phone: [phone]; Fax: [phone]

## 2015-10-26 NOTE — Patient Instructions (Signed)
Your physician wants you to follow-up in: 1 year with Dr McDowell You will receive a reminder letter in the mail two months in advance. If you don't receive a letter, please call our office to schedule the follow-up appointment.    Your physician recommends that you continue on your current medications as directed. Please refer to the Current Medication list given to you today.     If you need a refill on your cardiac medications before your next appointment, please call your pharmacy.     Thank you for choosing  [facility] !        

## 2015-11-20 DIAGNOSIS — E782 Mixed hyperlipidemia: Secondary | ICD-10-CM | POA: Diagnosis not present

## 2015-11-23 DIAGNOSIS — I714 Abdominal aortic aneurysm, without rupture: Secondary | ICD-10-CM | POA: Diagnosis not present

## 2015-11-23 DIAGNOSIS — N401 Enlarged prostate with lower urinary tract symptoms: Secondary | ICD-10-CM | POA: Diagnosis not present

## 2015-11-23 DIAGNOSIS — M8940 Other hypertrophic osteoarthropathy, unspecified site: Secondary | ICD-10-CM | POA: Diagnosis not present

## 2015-11-23 DIAGNOSIS — R001 Bradycardia, unspecified: Secondary | ICD-10-CM | POA: Diagnosis not present

## 2015-11-23 DIAGNOSIS — K210 Gastro-esophageal reflux disease with esophagitis: Secondary | ICD-10-CM | POA: Diagnosis not present

## 2015-11-27 DIAGNOSIS — N390 Urinary tract infection, site not specified: Secondary | ICD-10-CM | POA: Diagnosis not present

## 2015-11-29 DIAGNOSIS — M25511 Pain in right shoulder: Secondary | ICD-10-CM | POA: Diagnosis not present

## 2015-11-30 ENCOUNTER — Encounter: Payer: Self-pay | Admitting: Family

## 2015-12-05 ENCOUNTER — Ambulatory Visit (INDEPENDENT_AMBULATORY_CARE_PROVIDER_SITE_OTHER): Payer: Medicare Other | Admitting: Family

## 2015-12-05 ENCOUNTER — Encounter: Payer: Self-pay | Admitting: Family

## 2015-12-05 ENCOUNTER — Other Ambulatory Visit: Payer: Self-pay | Admitting: Family

## 2015-12-05 ENCOUNTER — Ambulatory Visit (HOSPITAL_COMMUNITY)
Admission: RE | Admit: 2015-12-05 | Discharge: 2015-12-05 | Disposition: A | Discharge status: Home / Self Care | Payer: Medicare Other | Source: Ambulatory Visit | Attending: Family | Admitting: Family

## 2015-12-05 VITALS — BP 130/84 | HR 70 | Temp 96.9°F | Ht 67.0 in | Wt 146.2 lb

## 2015-12-05 DIAGNOSIS — I714 Abdominal aortic aneurysm, without rupture, unspecified: Secondary | ICD-10-CM

## 2015-12-05 DIAGNOSIS — K219 Gastro-esophageal reflux disease without esophagitis: Secondary | ICD-10-CM | POA: Insufficient documentation

## 2015-12-05 DIAGNOSIS — I7419 Embolism and thrombosis of other parts of aorta: Secondary | ICD-10-CM | POA: Insufficient documentation

## 2015-12-05 NOTE — Progress Notes (Signed)
[facility]   CC: Follow up Abdominal Aortic Aneurysm  History of Present Illness  Shawn Frye is a 82 y.o. ([birth year]) male patient of Dr. Donnetta Hutching with known AAA, returns today for routine surveillance.  He has a mild back ache in the mornings which resolves with activity, he denies new back pain, denies abdominal pain.    He denies any cardiac problems.  He denies any claudication symptoms, denies non-healing wounds.  He has 3+ palpable bilateral popliteal pulses. January 2016 popliteal arteries duplex showed no evidence of popliteal aneurysms nor stenosis.  He denies any history of stroke or TIA symptoms.  Most of his surgeries were related to OA issues.   States he is very active physically, does not drink ETOH.  Pt denies dizziness, denies dyspnea.  He had a pacemaker inserted in May 2017 by Dr. [name] for second degree heart block; states he feels better since then.  Pt Diabetic: No  Pt smoker: non-smoker  Pt meds include:  Statin :No, states his cholesterol is good ASA: No  Other anticoagulants/antiplatelets: no   Past Medical History:  Diagnosis Date  . AAA (abdominal aortic aneurysm) ([place])   . Arthritis   . GERD (gastroesophageal reflux disease)   . Heart block AV second degree    St. Jude pacemaker May 2017 - Dr. [name]  . Infection of bladder   . Joint pain   . Symptomatic bradycardia    Past Surgical History:  Procedure Laterality Date  . BACK SURGERY    . CATARACT EXTRACTION W/ INTRAOCULAR LENS  IMPLANT, BILATERAL Bilateral   . EP IMPLANTABLE DEVICE N/A 10/02/2015   Procedure: Pacemaker Implant;  Surgeon: Evans Lance, MD;  Location: [facility];  Service: Cardiovascular;  Laterality: N/A;  . EXCISIONAL HEMORRHOIDECTOMY    . HEMORROIDECTOMY    . INGUINAL HERNIA REPAIR Bilateral 1991  . INSERT / REPLACE / REMOVE PACEMAKER    . JOINT REPLACEMENT    . [place] SURGERY  2005  . TOTAL KNEE ARTHROPLASTY  Bilateral 1998 -  2003  . TOTAL SHOULDER REPLACEMENT Left 1999   Partial  . TYMPANOSTOMY TUBE PLACEMENT Right    Social History Social History   Social History  . Marital status: Married    Spouse name: N/A  . Number of children: N/A  . Years of education: N/A   Occupational History  . Not on file.   Social History Main Topics  . Smoking status: Former Smoker    Packs/day: 1.00    Years: 15.00    Types: Cigarettes    Quit date: 05/13/1950  . Smokeless tobacco: Never Used  . Alcohol use No  . Drug use: No  . Sexual activity: No   Other Topics Concern  . Not on file   Social History Narrative  . No narrative on file   Family History Family History  Problem Relation Age of Onset  . Heart disease Father     NOT  before age 10  . Pneumonia Mother     Current Outpatient Prescriptions on File Prior to Visit  Medication Sig Dispense Refill  . omeprazole (PRILOSEC OTC) 20 MG tablet Take 20 mg by mouth daily.      No current facility-administered medications on file prior to visit.    Allergies  Allergen Reactions  . Percocet [Oxycodone-Acetaminophen] Hives  . Noroxin [Norfloxacin] Nausea And Vomiting  . Celebrex [Celecoxib] Rash    ROS: See HPI for pertinent positives  and negatives.  Physical Examination  Vitals:   12/05/15 0841  BP: 130/84  Pulse: 70  Temp: (!) 96.9 F (36.1 C)  Weight: 146 lb 4 oz (66.3 kg)  Height: 5\' 7"  (1.702 m)   Body mass index is 22.91 kg/m.  General: WDWN in NAD  Gait: Normal  HENT: WNL  Eyes: Pupils equal  Pulmonary: normal non-labored breathing , without rales, rhonchi, or wheezing  Cardiac: RRR with occasional missed contractions, no appreciable murmurs. Pacemaker subcutaneous at left upper chest. Abdomen: soft, NT, no palpable masses  Skin: no rashes, no ulcers, no cellulitis.  Vascular Exam/Pulses:  VASCULAR EXAM  Carotid Bruits  Left  Right    Negative  Negative   Aorta is strongly  palpable  Radial pulses are 2+ and =   VASCULAR EXAM:  Extremities without ischemic changes  without Gangrene; without open wounds.   LE Pulses  LEFT  RIGHT   FEMORAL  2+palpable  2+palpable   POPLITEAL  3+palpable  3+palpable   POSTERIOR TIBIAL  2+ palpable  faintly palpable   DORSALIS PEDIS  ANTERIOR TIBIAL  1+ palpable  faintly palpable    Musculoskeletal: age appropriate muscle wasting or atrophy; trace peripheral edema   Neurologic: A&O X 3; Appropriate Affect, MOTOR FUNCTION: 5/5 Symmetric, CN 2-12 intact except for hard of hearing, wearing bilateral hearing aids.  Speech is fluent/normal   Non-Invasive Vascular Imaging  AAA Duplex (12/05/2015) ABDOMINAL AORTA DUPLEX EVALUATION    INDICATION: Abdominal aortic aneurysm    PREVIOUS INTERVENTION(S):     DUPLEX EXAM:     LOCATION DIAMETER AP (cm) DIAMETER TRANSVERSE (cm) VELOCITIES (cm/sec)  Aorta Proximal 2.7 2.7 40  Aorta Mid 5.01 5.7 51  Aorta Distal 3.18 3.93 32  Right Common Iliac Artery 1.7 1.8 57  Left Common Iliac Artery 1.8 1.8 66    Previous max aortic diameter:  5.2 Date: 05-25-15     ADDITIONAL FINDINGS:     IMPRESSION: Partially thrombosed aneurysm in the mid aorta.    Compared to the previous exam:  Slight increase in transverse mid aorta measurement compared to study of 05/25/15.      Medical Decision Making  The patient is a 82 y.o. male who presents with asymptomatic AAA with a 5 mm increase in size in 6 months to 5.7 cm today. Last creatinine result on file is 0.83 on 09/29/15.   Based on this patient's exam and diagnostic studies, and after discussing with Dr. Donnetta Hutching and Dr. Donnetta Hutching had a discussion with pt and his wife, the patient will follow up within 2 weeks with the following studies: CTA abd/pelvis in [place], at pt request, then follow up with Dr. Donnetta Hutching for discussion of results.  Consideration for repair of AAA would be made  when the size is 5.5 cm, growth > 1 cm/yr, and symptomatic status.  I emphasized the importance of maximal medical management including strict control of blood pressure, blood glucose, and lipid levels, antiplatelet agents, obtaining regular exercise, and continued cessation of smoking.   The patient was given information about AAA including signs, symptoms, treatment, and how to minimize the risk of enlargement and rupture of aneurysms.    The patient was advised to call 911 should the patient experience sudden onset abdominal or back pain.   Thank you for allowing [place] to participate in this patient's care.  Clemon Chambers, RN, MSN, FNP-C [facility] Office: [phone]  Clinic Physician: Early  12/05/2015, 8:56 AM

## 2015-12-05 NOTE — Patient Instructions (Signed)
Abdominal Aortic Aneurysm An aneurysm is a weakened or damaged part of an artery wall that bulges from the normal force of blood pumping through the body. An abdominal aortic aneurysm is an aneurysm that occurs in the lower part of the aorta, the main artery of the body.  The major concern with an abdominal aortic aneurysm is that it can enlarge and burst (rupture) or blood can flow between the layers of the wall of the aorta through a tear (aorticdissection). Both of these conditions can cause bleeding inside the body and can be life threatening unless diagnosed and treated promptly. CAUSES  The exact cause of an abdominal aortic aneurysm is unknown. Some contributing factors are:   A hardening of the arteries caused by the buildup of fat and other substances in the lining of a blood vessel (arteriosclerosis).  Inflammation of the walls of an artery (arteritis).   Connective tissue diseases, such as Marfan syndrome.   Abdominal trauma.   An infection, such as syphilis or staphylococcus, in the wall of the aorta (infectious aortitis) caused by bacteria. RISK FACTORS  Risk factors that contribute to an abdominal aortic aneurysm may include:  Age older than 60 years.   High blood pressure (hypertension).  Male gender.  Ethnicity (white race).  Obesity.  Family history of aneurysm (first degree relatives only).  Tobacco use. PREVENTION  The following healthy lifestyle habits may help decrease your risk of abdominal aortic aneurysm:  Quitting smoking. Smoking can raise your blood pressure and cause arteriosclerosis.  Limiting or avoiding alcohol.  Keeping your blood pressure, blood sugar level, and cholesterol levels within normal limits.  Decreasing your salt intake. In somepeople, too much salt can raise blood pressure and increase your risk of abdominal aortic aneurysm.  Eating a diet low in saturated fats and cholesterol.  Increasing your fiber intake by including  whole grains, vegetables, and fruits in your diet. Eating these foods may help lower blood pressure.  Maintaining a healthy weight.  Staying physically active and exercising regularly. SYMPTOMS  The symptoms of abdominal aortic aneurysm may vary depending on the size and rate of growth of the aneurysm.Most grow slowly and do not have any symptoms. When symptoms do occur, they may include:  Pain (abdomen, side, lower back, or groin). The pain may vary in intensity. A sudden onset of severe pain may indicate that the aneurysm has ruptured.  Feeling full after eating only small amounts of food.  Nausea or vomiting or both.  Feeling a pulsating lump in the abdomen.  Feeling faint or passing out. DIAGNOSIS  Since most unruptured abdominal aortic aneurysms have no symptoms, they are often discovered during diagnostic exams for other conditions. An aneurysm may be found during the following procedures:  Ultrasonography (A one-time screening for abdominal aortic aneurysm by ultrasonography is also recommended for all men aged 65-75 years who have ever smoked).  X-ray exams.  A computed tomography (CT).  Magnetic resonance imaging (MRI).  Angiography or arteriography. TREATMENT  Treatment of an abdominal aortic aneurysm depends on the size of your aneurysm, your age, and risk factors for rupture. Medication to control blood pressure and pain may be used to manage aneurysms smaller than 6 cm. Regular monitoring for enlargement may be recommended by your caregiver if:  The aneurysm is 3-4 cm in size (an annual ultrasonography may be recommended).  The aneurysm is 4-4.5 cm in size (an ultrasonography every 6 months may be recommended).  The aneurysm is larger than 4.5 cm in   size (your caregiver may ask that you be examined by a vascular surgeon). If your aneurysm is larger than 6 cm, surgical repair may be recommended. There are two main methods for repair of an aneurysm:   Endovascular  repair (a minimally invasive surgery). This is done most often.  Open repair. This method is used if an endovascular repair is not possible.   This information is not intended to replace advice given to you by your health care provider. Make sure you discuss any questions you have with your health care provider.   Document Released: 02/06/2005 Document Revised: 08/24/2012 Document Reviewed: 05/29/2012 Elsevier Interactive Patient Education 2016 [employer]vier Inc[employer]  

## 2015-12-12 ENCOUNTER — Other Ambulatory Visit: Payer: Self-pay

## 2015-12-12 DIAGNOSIS — I714 Abdominal aortic aneurysm, without rupture, unspecified: Secondary | ICD-10-CM

## 2015-12-13 ENCOUNTER — Ambulatory Visit (HOSPITAL_COMMUNITY)
Admission: RE | Admit: 2015-12-13 | Discharge: 2015-12-13 | Disposition: A | Discharge status: Home / Self Care | Payer: Medicare Other | Source: Ambulatory Visit | Attending: Vascular Surgery | Admitting: Vascular Surgery

## 2015-12-13 DIAGNOSIS — I708 Atherosclerosis of other arteries: Secondary | ICD-10-CM | POA: Diagnosis not present

## 2015-12-13 DIAGNOSIS — I700 Atherosclerosis of aorta: Secondary | ICD-10-CM | POA: Diagnosis not present

## 2015-12-13 DIAGNOSIS — I714 Abdominal aortic aneurysm, without rupture, unspecified: Secondary | ICD-10-CM

## 2015-12-13 DIAGNOSIS — I723 Aneurysm of iliac artery: Secondary | ICD-10-CM | POA: Diagnosis not present

## 2015-12-13 LAB — POCT I-STAT CREATININE: Creatinine, Ser: 0.9 mg/dL (ref 0.61–1.24)

## 2015-12-13 MED ORDER — IOPAMIDOL (ISOVUE-370) INJECTION 76%
100.0000 mL | Freq: Once | INTRAVENOUS | Status: AC | PRN
  Administered 2015-12-13: 100 mL via INTRAVENOUS

## 2015-12-14 ENCOUNTER — Encounter: Payer: Self-pay | Admitting: Vascular Surgery

## 2015-12-19 ENCOUNTER — Encounter: Payer: Self-pay | Admitting: Vascular Surgery

## 2015-12-19 ENCOUNTER — Ambulatory Visit (INDEPENDENT_AMBULATORY_CARE_PROVIDER_SITE_OTHER): Payer: Medicare Other | Admitting: Vascular Surgery

## 2015-12-19 VITALS — BP 110/60 | HR 77 | Temp 96.4°F | Resp 16 | Ht 67.0 in | Wt 150.0 lb

## 2015-12-19 DIAGNOSIS — I714 Abdominal aortic aneurysm, without rupture, unspecified: Secondary | ICD-10-CM

## 2015-12-19 NOTE — Progress Notes (Signed)
[facility]  Patient name: Shawn Frye MRN: [id number] DOB: March 01, 1919 Sex: male  REASON FOR VISIT: Discuss recent CT scan result for abdominal aortic aneurysm  HPI: Shawn Frye is a 81 y.o. male was seen here several weeks ago and routine follow-up of his abdominal aortic aneurysm. Ultrasound suggested significant gross to 5.7 cm. He underwent CT scan is here today with his wife for reveal this. Continues to be asymptomatic  Past Medical History:  Diagnosis Date  . AAA (abdominal aortic aneurysm) ([place])   . Arthritis   . GERD (gastroesophageal reflux disease)   . Heart block AV second degree    St. Jude pacemaker May 2017 - Dr. [name]  . Infection of bladder   . Joint pain   . Symptomatic bradycardia     Family History  Problem Relation Age of Onset  . Heart disease Father     NOT  before age 48  . Pneumonia Mother     SOCIAL HISTORY: Social History  Substance Use Topics  . Smoking status: Former Smoker    Packs/day: 1.00    Years: 15.00    Types: Cigarettes    Quit date: 05/13/1950  . Smokeless tobacco: Never Used  . Alcohol use No    Allergies  Allergen Reactions  . Percocet [Oxycodone-Acetaminophen] Hives  . Noroxin [Norfloxacin] Nausea And Vomiting  . Celebrex [Celecoxib] Rash    Current Outpatient Prescriptions  Medication Sig Dispense Refill  . omeprazole (PRILOSEC OTC) 20 MG tablet Take 20 mg by mouth daily.      No current facility-administered medications for this visit.     REVIEW OF SYSTEMS:  [X]  denotes positive finding, [ ]  denotes negative finding Cardiac  Comments:  Chest pain or chest pressure:    Shortness of breath upon exertion:    Short of breath when lying flat:    Irregular heart rhythm:        Vascular    Pain in calf, thigh, or hip brought on by ambulation:    Pain in feet at night that wakes you up from your sleep:     Blood clot in your veins:    Leg swelling:         Pulmonary    Oxygen  at home:    Productive cough:     Wheezing:         Neurologic    Sudden weakness in arms or legs:     Sudden numbness in arms or legs:     Sudden onset of difficulty speaking or slurred speech:    Temporary loss of vision in one eye:     Problems with dizziness:         Gastrointestinal    Blood in stool:     Vomited blood:         Genitourinary    Burning when urinating:     Blood in urine:        Psychiatric    Major depression:         Hematologic    Bleeding problems:    Problems with blood clotting too easily:        Skin    Rashes or ulcers:        Constitutional    Fever or chills:      PHYSICAL EXAM: Vitals:   12/19/15 1543  BP: 110/60  Pulse: 77  Resp: 16  Temp: (!) 96.4 F (35.8 C)  TempSrc: Oral  SpO2: 95%  Weight: 150 lb (68 kg)  Height: 5\' 7"  (1.702 m)    GENERAL: The patient is a well-nourished male, in no acute distress. The vital signs are documented above. PULMONARY: There is good air exchange  MUSCULOSKELETAL: There are no major deformities or cyanosis. NEUROLOGIC: No focal weakness or paresthesias are detected. SKIN: There are no ulcers or rashes noted. PSYCHIATRIC: The patient has a normal affect.  DATA:  CT scan was reviewed. This does show ectasia of his iliac arteries a 5.3 cm infrarenal abdominal aortic aneurysm. Does appear to have approximately 15 mm infrarenal neck so would be a candidate for stent graft.  MEDICAL ISSUES: Discussed continued observation with the patient. At his advanced age would recommend six-month follow-up. Would recommend stent graft repair only for increased size her rapid growth. CT scan from 2005 revealed a 4.4 cm aneurysm at that time.    Curt Jews [facility] [employer] [phone]

## 2016-01-03 ENCOUNTER — Ambulatory Visit: Payer: Medicare Other | Admitting: Internal Medicine

## 2016-01-03 NOTE — Addendum Note (Signed)
Addended by: Mena Goes on: 01/03/2016 03:56 PM   Modules accepted: Orders

## 2016-01-12 ENCOUNTER — Encounter: Payer: Self-pay | Admitting: Internal Medicine

## 2016-01-12 ENCOUNTER — Ambulatory Visit (INDEPENDENT_AMBULATORY_CARE_PROVIDER_SITE_OTHER): Payer: Medicare Other | Admitting: Internal Medicine

## 2016-01-12 VITALS — BP 100/68 | HR 84 | Ht 67.0 in | Wt 147.0 lb

## 2016-01-12 DIAGNOSIS — Z950 Presence of cardiac pacemaker: Secondary | ICD-10-CM

## 2016-01-12 DIAGNOSIS — I441 Atrioventricular block, second degree: Secondary | ICD-10-CM | POA: Diagnosis not present

## 2016-01-12 DIAGNOSIS — R001 Bradycardia, unspecified: Secondary | ICD-10-CM | POA: Diagnosis not present

## 2016-01-12 DIAGNOSIS — I714 Abdominal aortic aneurysm, without rupture, unspecified: Secondary | ICD-10-CM

## 2016-01-12 LAB — CUP PACEART INCLINIC DEVICE CHECK
Brady Statistic RA Percent Paced: 8.4 %
Brady Statistic RV Percent Paced: 99.66 %
Date Time Interrogation Session: 20170901092929
Implantable Lead Location: 753859
Lead Channel Impedance Value: 487.5 Ohm
Lead Channel Pacing Threshold Amplitude: 0.75 V
Lead Channel Pacing Threshold Pulse Width: 0.5 ms
Lead Channel Sensing Intrinsic Amplitude: 1.6 mV
Lead Channel Sensing Intrinsic Amplitude: 5 mV
Lead Channel Setting Pacing Amplitude: 2 V
Lead Channel Setting Pacing Pulse Width: 0.5 ms
Lead Channel Setting Sensing Sensitivity: 2 mV
MDC IDC LEAD IMPLANT DT: 20170522
MDC IDC LEAD IMPLANT DT: 20170522
MDC IDC LEAD LOCATION: 753860
MDC IDC MSMT BATTERY REMAINING LONGEVITY: 103.2
MDC IDC MSMT BATTERY VOLTAGE: 2.99 V
MDC IDC MSMT LEADCHNL RA IMPEDANCE VALUE: 462.5 Ohm
MDC IDC MSMT LEADCHNL RA PACING THRESHOLD AMPLITUDE: 0.75 V
MDC IDC MSMT LEADCHNL RA PACING THRESHOLD AMPLITUDE: 0.75 V
MDC IDC MSMT LEADCHNL RA PACING THRESHOLD PULSEWIDTH: 0.5 ms
MDC IDC MSMT LEADCHNL RV PACING THRESHOLD AMPLITUDE: 0.75 V
MDC IDC MSMT LEADCHNL RV PACING THRESHOLD PULSEWIDTH: 0.5 ms
MDC IDC MSMT LEADCHNL RV PACING THRESHOLD PULSEWIDTH: 0.5 ms
MDC IDC PG SERIAL: 7899864
MDC IDC SET LEADCHNL RV PACING AMPLITUDE: 2.5 V
Pulse Gen Model: 2272

## 2016-01-12 NOTE — Progress Notes (Signed)
HPI Mr. Dabish presents today for ongoing evaluation and management of his PPM. He is a pleasant elderly man who has had fatigue and weakness and has been found to have 2:1 AV block with a ventricular rate of 38-42/min. He underwent PPM insertion several months ago. He has no anginal symptoms and he admits to becoming fairly sedentary. No edema. He has a AAA which is being followed. No abdominal pain. Allergies  Allergen Reactions  . Percocet [Oxycodone-Acetaminophen] Hives  . Noroxin [Norfloxacin] Nausea And Vomiting  . Celebrex [Celecoxib] Rash     Current Outpatient Prescriptions  Medication Sig Dispense Refill  . omeprazole (PRILOSEC OTC) 20 MG tablet Take 20 mg by mouth daily.      No current facility-administered medications for this visit.      Past Medical History:  Diagnosis Date  . AAA (abdominal aortic aneurysm) ([place])   . Arthritis   . GERD (gastroesophageal reflux disease)   . Heart block AV second degree    St. Jude pacemaker May 2017 - Dr. [name]  . Infection of bladder   . Joint pain   . Symptomatic bradycardia     ROS:   All systems reviewed and negative except as noted in the HPI.   Past Surgical History:  Procedure Laterality Date  . BACK SURGERY    . CATARACT EXTRACTION W/ INTRAOCULAR LENS  IMPLANT, BILATERAL Bilateral   . EP IMPLANTABLE DEVICE N/A 10/02/2015   Procedure: Pacemaker Implant;  Surgeon: Evans Lance, MD;  Location: [facility];  Service: Cardiovascular;  Laterality: N/A;  . EXCISIONAL HEMORRHOIDECTOMY    . HEMORROIDECTOMY    . INGUINAL HERNIA REPAIR Bilateral 1991  . INSERT / REPLACE / REMOVE PACEMAKER    . JOINT REPLACEMENT    . [place] SURGERY  2005  . TOTAL KNEE ARTHROPLASTY Bilateral 1998 -  2003  . TOTAL SHOULDER REPLACEMENT Left 1999   Partial  . TYMPANOSTOMY TUBE PLACEMENT Right      Family History  Problem Relation Age of Onset  . Heart disease Father     NOT  before age 66  . Pneumonia Mother       Social History   Social History  . Marital status: Married    Spouse name: N/A  . Number of children: N/A  . Years of education: N/A   Occupational History  . Not on file.   Social History Main Topics  . Smoking status: Former Smoker    Packs/day: 1.00    Years: 15.00    Types: Cigarettes    Quit date: 05/13/1950  . Smokeless tobacco: Never Used  . Alcohol use No  . Drug use: No  . Sexual activity: No   Other Topics Concern  . Not on file   Social History Narrative  . No narrative on file     BP 100/68   Pulse 84   Ht 5\' 7"  (1.702 m)   Wt 147 lb (66.7 kg)   SpO2 92%   BMI 23.02 kg/m   Physical Exam:  stable appearing 82 yo man, NAD HEENT: Unremarkable Neck:  No JVD, no thyromegally Lymphatics:  No adenopathy Back:  No CVA tenderness Lungs:  Clear with no wheezes HEART:  Regular brady rhythm, no murmurs, no rubs, no clicks Abd:  soft, positive bowel sounds, no organomegally, no rebound, no guarding Ext:  2 plus pulses, no edema, no cyanosis, no clubbing Skin:  No rashes no nodules Neuro:  CN  II through XII intact, motor grossly intact  Assess/Plan: 1. Symptomatic 2:1 AV block - he is s/p PPM and asymptomatic 2. HTN - his blood pressure is minimally elevated. No change in meds. 3. Peripheral vascular disease - he has an aortic aneurysm which is stable. He is asymptomatic. 4. PPM - his St. Jude DDD PM is working normally. Will recheck in several months  Mikle Bosworth.D.

## 2016-01-12 NOTE — Patient Instructions (Signed)
Your physician wants you to follow-up in: 9 Months with Dr. [name]. You will receive a reminder letter in the mail two months in advance. If you don't receive a letter, please call our office to schedule the follow-up appointment.  Remote monitoring is used to monitor your Pacemaker of ICD from home. This monitoring reduces the number of office visits required to check your device to one time per year. It allows [place] to keep an eye on the functioning of your device to ensure it is working properly. You are scheduled for a device check from home on 04/15/16. You may send your transmission at any time that day. If you have a wireless device, the transmission will be sent automatically. After your physician reviews your transmission, you will receive a postcard with your next transmission date.  Your physician recommends that you continue on your current medications as directed. Please refer to the Current Medication list given to you today.  If you need a refill on your cardiac medications before your next appointment, please call your pharmacy.  Thank you for choosing [facility]!

## 2016-01-24 DIAGNOSIS — M19011 Primary osteoarthritis, right shoulder: Secondary | ICD-10-CM | POA: Diagnosis not present

## 2016-02-13 DIAGNOSIS — R531 Weakness: Secondary | ICD-10-CM | POA: Diagnosis not present

## 2016-02-13 DIAGNOSIS — B309 Viral conjunctivitis, unspecified: Secondary | ICD-10-CM | POA: Diagnosis not present

## 2016-02-13 DIAGNOSIS — R05 Cough: Secondary | ICD-10-CM | POA: Diagnosis not present

## 2016-02-13 DIAGNOSIS — J060 Acute laryngopharyngitis: Secondary | ICD-10-CM | POA: Diagnosis not present

## 2016-02-19 DIAGNOSIS — H04123 Dry eye syndrome of bilateral lacrimal glands: Secondary | ICD-10-CM | POA: Diagnosis not present

## 2016-02-27 ENCOUNTER — Ambulatory Visit (INDEPENDENT_AMBULATORY_CARE_PROVIDER_SITE_OTHER): Payer: Medicare Other | Admitting: Urology

## 2016-02-27 DIAGNOSIS — N402 Nodular prostate without lower urinary tract symptoms: Secondary | ICD-10-CM

## 2016-03-04 DIAGNOSIS — Z23 Encounter for immunization: Secondary | ICD-10-CM | POA: Diagnosis not present

## 2016-03-07 ENCOUNTER — Ambulatory Visit (INDEPENDENT_AMBULATORY_CARE_PROVIDER_SITE_OTHER): Payer: Medicare Other | Admitting: Otolaryngology

## 2016-03-07 DIAGNOSIS — H6121 Impacted cerumen, right ear: Secondary | ICD-10-CM | POA: Diagnosis not present

## 2016-03-07 DIAGNOSIS — H903 Sensorineural hearing loss, bilateral: Secondary | ICD-10-CM | POA: Diagnosis not present

## 2016-04-10 ENCOUNTER — Ambulatory Visit (INDEPENDENT_AMBULATORY_CARE_PROVIDER_SITE_OTHER): Payer: Medicare Other | Admitting: Orthopaedic Surgery

## 2016-04-10 ENCOUNTER — Encounter (INDEPENDENT_AMBULATORY_CARE_PROVIDER_SITE_OTHER): Payer: Self-pay | Admitting: Orthopaedic Surgery

## 2016-04-10 VITALS — BP 116/65 | HR 77 | Resp 14 | Ht 67.0 in | Wt 155.0 lb

## 2016-04-10 DIAGNOSIS — M19011 Primary osteoarthritis, right shoulder: Secondary | ICD-10-CM | POA: Diagnosis not present

## 2016-04-10 MED ORDER — METHYLPREDNISOLONE ACETATE 40 MG/ML IJ SUSP
80.0000 mg | INTRAMUSCULAR | Status: AC | PRN
  Administered 2016-04-10: 80 mg

## 2016-04-10 MED ORDER — LIDOCAINE HCL 1 % IJ SOLN
2.0000 mL | INTRAMUSCULAR | Status: AC | PRN
  Administered 2016-04-10: 2 mL

## 2016-04-10 MED ORDER — BUPIVACAINE HCL 0.5 % IJ SOLN
2.0000 mL | INTRAMUSCULAR | Status: AC | PRN
  Administered 2016-04-10: 2 mL via INTRA_ARTICULAR

## 2016-04-10 NOTE — Progress Notes (Signed)
Office Visit Note   Patient: Shawn Frye           Date of Birth: Jun 26, 1918           MRN: [id number] Visit Date: 04/10/2016              Requested by: [name], MD [address] [place], [place] [address] PCP: Wende Neighbors, MD   Assessment & Plan: Visit Diagnoses: No diagnosis found. 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 I see him on occasion for injection with significant improvement.  Plan: f/u prn  Follow-Up Instructions: No Follow-up on file.   Orders:  No orders of the defined types were placed in this encounter.  No orders of the defined types were placed in this encounter.     Procedures: Large Joint Inj Date/Time: 04/10/2016 10:50 AM Performed by: Garald Balding Authorized by: Garald Balding   Consent Given by:  Patient Timeout: prior to procedure the correct patient, procedure, and site was verified   Indications:  Pain Location:  Shoulder Site:  R glenohumeral Prep: patient was prepped and draped in usual sterile fashion   Needle Size:  25 G Needle Length:  1.5 inches Approach:  Posterior Ultrasound Guidance: No   Fluoroscopic Guidance: No   Arthrogram: No   Medications:  2 mL lidocaine 1 %; 2 mL bupivacaine 0.5 %; 80 mg methylPREDNISolone acetate 40 MG/ML Aspiration Attempted: No   Patient tolerance:  Patient tolerated the procedure well with no immediate complications     Clinical Data: No additional findings. Prior films of the right shoulder were performed in July 2016. 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 The humeral head is flattened and sclerotic was a normal space between the humeral head and the acromion. Subjective: No chief complaint on file.   Right shoulder pain, end stage OA    Review of Systems   Objective: Vital Signs: There were no vitals taken for this visit.  Physical Exam  Ortho Exam right shoulder exam reveals positive crepitation with internal/external  rotation. Motion is limited. He has approximately 90 of flexion and 70 of abduction. There is limited external rotation. He has good grip and release but does have atrophy of the intrinsic muscles.  Specialty Comments:  No specialty comments available.  Imaging: No results found.   PMFS History: Patient Active Problem List   Diagnosis Date Noted  . Mobitz type 2 second degree atrioventricular block 10/02/2015  . Bradycardia 08/29/2015  . Abdominal aneurysm without mention of rupture 03/21/2011   Past Medical History:  Diagnosis Date  . AAA (abdominal aortic aneurysm) ([place])   . Arthritis   . GERD (gastroesophageal reflux disease)   . Heart block AV second degree    St. Jude pacemaker May 2017 - Dr. [name]  . Infection of bladder   . Joint pain   . Symptomatic bradycardia     Family History  Problem Relation Age of Onset  . Heart disease Father     NOT  before age 39  . Pneumonia Mother     Past Surgical History:  Procedure Laterality Date  . BACK SURGERY    . CATARACT EXTRACTION W/ INTRAOCULAR LENS  IMPLANT, BILATERAL Bilateral   . EP IMPLANTABLE DEVICE N/A 10/02/2015   Procedure: Pacemaker Implant;  Surgeon: Evans Lance, MD;  Location: [facility];  Service: Cardiovascular;  Laterality: N/A;  . EXCISIONAL HEMORRHOIDECTOMY    . HEMORROIDECTOMY    .  INGUINAL HERNIA REPAIR Bilateral 1991  . INSERT / REPLACE / REMOVE PACEMAKER    . JOINT REPLACEMENT    . [place] SURGERY  2005  . TOTAL KNEE ARTHROPLASTY Bilateral 1998 -  2003  . TOTAL SHOULDER REPLACEMENT Left 1999   Partial  . TYMPANOSTOMY TUBE PLACEMENT Right    Social History   Occupational History  . Not on file.   Social History Main Topics  . Smoking status: Former Smoker    Packs/day: 1.00    Years: 15.00    Types: Cigarettes    Quit date: 05/13/1950  . Smokeless tobacco: Never Used  . Alcohol use No  . Drug use: No  . Sexual activity: No

## 2016-04-15 ENCOUNTER — Ambulatory Visit (INDEPENDENT_AMBULATORY_CARE_PROVIDER_SITE_OTHER): Payer: Medicare Other | Admitting: *Deleted

## 2016-04-15 DIAGNOSIS — I441 Atrioventricular block, second degree: Secondary | ICD-10-CM

## 2016-04-15 NOTE — Progress Notes (Signed)
Remote pacemaker transmission.   

## 2016-04-18 ENCOUNTER — Ambulatory Visit (INDEPENDENT_AMBULATORY_CARE_PROVIDER_SITE_OTHER): Payer: Medicare Other | Admitting: Otolaryngology

## 2016-04-18 DIAGNOSIS — H6123 Impacted cerumen, bilateral: Secondary | ICD-10-CM

## 2016-04-24 ENCOUNTER — Encounter: Payer: Self-pay | Admitting: Cardiology

## 2016-04-25 LAB — CUP PACEART REMOTE DEVICE CHECK
Date Time Interrogation Session: 20171214134940
Implantable Lead Implant Date: 20170522
Implantable Lead Location: 753859
Implantable Pulse Generator Implant Date: 20170522
Lead Channel Pacing Threshold Amplitude: 0.75 V
Lead Channel Pacing Threshold Pulse Width: 0.5 ms
Lead Channel Pacing Threshold Pulse Width: 0.5 ms
MDC IDC LEAD IMPLANT DT: 20170522
MDC IDC LEAD LOCATION: 753860
MDC IDC MSMT LEADCHNL RA SENSING INTR AMPL: 0.8 mV
MDC IDC MSMT LEADCHNL RV PACING THRESHOLD AMPLITUDE: 0.75 V
MDC IDC MSMT LEADCHNL RV SENSING INTR AMPL: 11.5 mV
MDC IDC STAT BRADY RA PERCENT PACED: 10 %
MDC IDC STAT BRADY RV PERCENT PACED: 99 %
Pulse Gen Model: 2272
Pulse Gen Serial Number: 7899864

## 2016-05-24 DIAGNOSIS — I10 Essential (primary) hypertension: Secondary | ICD-10-CM | POA: Diagnosis not present

## 2016-05-28 DIAGNOSIS — N400 Enlarged prostate without lower urinary tract symptoms: Secondary | ICD-10-CM | POA: Diagnosis not present

## 2016-05-28 DIAGNOSIS — I714 Abdominal aortic aneurysm, without rupture: Secondary | ICD-10-CM | POA: Diagnosis not present

## 2016-05-28 DIAGNOSIS — K210 Gastro-esophageal reflux disease with esophagitis: Secondary | ICD-10-CM | POA: Diagnosis not present

## 2016-05-28 DIAGNOSIS — D509 Iron deficiency anemia, unspecified: Secondary | ICD-10-CM | POA: Diagnosis not present

## 2016-05-28 DIAGNOSIS — I499 Cardiac arrhythmia, unspecified: Secondary | ICD-10-CM | POA: Diagnosis not present

## 2016-06-11 ENCOUNTER — Encounter: Payer: Self-pay | Admitting: Internal Medicine

## 2016-06-18 ENCOUNTER — Encounter: Payer: Self-pay | Admitting: Vascular Surgery

## 2016-06-25 ENCOUNTER — Ambulatory Visit (HOSPITAL_COMMUNITY)
Admission: RE | Admit: 2016-06-25 | Discharge: 2016-06-25 | Disposition: A | Discharge status: Home / Self Care | Payer: Medicare Other | Source: Ambulatory Visit | Attending: Vascular Surgery | Admitting: Vascular Surgery

## 2016-06-25 ENCOUNTER — Encounter: Payer: Self-pay | Admitting: Vascular Surgery

## 2016-06-25 ENCOUNTER — Ambulatory Visit (INDEPENDENT_AMBULATORY_CARE_PROVIDER_SITE_OTHER): Payer: Medicare Other | Admitting: Vascular Surgery

## 2016-06-25 VITALS — BP 129/75 | HR 73 | Temp 97.2°F | Resp 18 | Ht 67.0 in | Wt 147.8 lb

## 2016-06-25 DIAGNOSIS — I714 Abdominal aortic aneurysm, without rupture, unspecified: Secondary | ICD-10-CM

## 2016-06-25 NOTE — Progress Notes (Signed)
Vascular and Vein Specialist of [facility]  Patient name: Shawn Frye MRN: [id number] DOB: [birth year] Sex: male  REASON FOR VISIT: Follow-up abdominal aortic aneurysm  HPI: Shawn Frye is a 83 y.o. male here today for follow-up. Here with his wife. He remains in excellent health with no new medical difficulties. Active with pacemaker placement in May and no complications associated with this. No symptoms are full to his aneurysm.  Past Medical History:  Diagnosis Date  . AAA (abdominal aortic aneurysm) ([place])   . Arthritis   . GERD (gastroesophageal reflux disease)   . Heart block AV second degree    St. Jude pacemaker May 2017 - Dr. [name]  . Infection of bladder   . Joint pain   . Symptomatic bradycardia     Family History  Problem Relation Age of Onset  . Heart disease Father     NOT  before age 16  . Pneumonia Mother     SOCIAL HISTORY: Social History  Substance Use Topics  . Smoking status: Former Smoker    Packs/day: 1.00    Years: 15.00    Types: Cigarettes    Quit date: 05/13/1950  . Smokeless tobacco: Never Used  . Alcohol use No    Allergies  Allergen Reactions  . Percocet [Oxycodone-Acetaminophen] Hives  . Noroxin [Norfloxacin] Nausea And Vomiting  . Celebrex [Celecoxib] Rash    Current Outpatient Prescriptions  Medication Sig Dispense Refill  . omeprazole (PRILOSEC OTC) 20 MG tablet Take 20 mg by mouth daily.     . psyllium (METAMUCIL SMOOTH TEXTURE) 28 % packet Take 1 packet by mouth 2 (two) times daily.     No current facility-administered medications for this visit.     REVIEW OF SYSTEMS:  [X]  denotes positive finding, [ ]  denotes negative finding Cardiac  Comments:  Chest pain or chest pressure:    Shortness of breath upon exertion:    Short of breath when lying flat:    Irregular heart rhythm:        Vascular    Pain in calf, thigh, or hip brought on by ambulation:    Pain in feet at night that  wakes you up from your sleep:     Blood clot in your veins:    Leg swelling:           PHYSICAL EXAM: Vitals:   06/25/16 0906  BP: 129/75  Pulse: 73  Resp: 18  Temp: 97.2 F (36.2 C)  TempSrc: Oral  SpO2: 94%  Weight: 147 lb 12.8 oz (67 kg)  Height: 5\' 7"  (1.702 m)    GENERAL: The patient is a well-nourished male, in no acute distress. The vital signs are documented above. CARDIOVASCULAR: Carotid arteries without bruits bilaterally. 2+ radial and 2+ femoral pulses. Abdomen soft nontender with palpable aneurysm  PULMONARY: There is good air exchange  MUSCULOSKELETAL: There are no major deformities or cyanosis. NEUROLOGIC: No focal weakness or paresthesias are detected. SKIN: There are no ulcers or rashes noted. PSYCHIATRIC: The patient has a normal affect.  DATA:  Duplex today shows maximal diameter 5.6 cm compared to maximal diameter of 5.7 cm in July 2017  MEDICAL ISSUES:  Discussed with patient and his wife present. I explained that unfortunately show no evidence of growth associated with his aneurysm. Did explain the approximate 5% per year risk of rupture. With his advanced age of 68 I would certainly try to be conservative if possible. Will see him again in 6 months  with continued ultrasound surveillance. Again discussed symptoms of leaking aneurysm he knows to present immediately to the emergency room should this occur   Rosetta Posner, MD [facility] Vascular and Vein Specialists of [facility] Tel [phone] Pager [phone]

## 2016-07-03 NOTE — Addendum Note (Signed)
Addended by: Lianne Cure A on: 07/03/2016 04:43 PM   Modules accepted: Orders

## 2016-07-11 DIAGNOSIS — H43811 Vitreous degeneration, right eye: Secondary | ICD-10-CM | POA: Diagnosis not present

## 2016-07-15 ENCOUNTER — Ambulatory Visit (INDEPENDENT_AMBULATORY_CARE_PROVIDER_SITE_OTHER): Payer: Medicare Other | Admitting: *Deleted

## 2016-07-15 DIAGNOSIS — I441 Atrioventricular block, second degree: Secondary | ICD-10-CM

## 2016-07-15 NOTE — Progress Notes (Signed)
Remote pacemaker transmission.   

## 2016-07-16 ENCOUNTER — Encounter: Payer: Self-pay | Admitting: Cardiology

## 2016-07-16 LAB — CUP PACEART REMOTE DEVICE CHECK
Brady Statistic AP VP Percent: 12 %
Brady Statistic AS VP Percent: 88 %
Brady Statistic RA Percent Paced: 11 %
Brady Statistic RV Percent Paced: 99 %
Implantable Lead Implant Date: 20170522
Implantable Pulse Generator Implant Date: 20170522
Lead Channel Impedance Value: 450 Ohm
Lead Channel Pacing Threshold Pulse Width: 0.5 ms
Lead Channel Pacing Threshold Pulse Width: 0.5 ms
Lead Channel Sensing Intrinsic Amplitude: 6.7 mV
Lead Channel Setting Pacing Amplitude: 2 V
Lead Channel Setting Pacing Amplitude: 2.5 V
Lead Channel Setting Sensing Sensitivity: 2 mV
MDC IDC LEAD IMPLANT DT: 20170522
MDC IDC LEAD LOCATION: 753859
MDC IDC LEAD LOCATION: 753860
MDC IDC MSMT BATTERY REMAINING LONGEVITY: 96 mo
MDC IDC MSMT BATTERY REMAINING PERCENTAGE: 95.5 %
MDC IDC MSMT BATTERY VOLTAGE: 2.99 V
MDC IDC MSMT LEADCHNL RA IMPEDANCE VALUE: 410 Ohm
MDC IDC MSMT LEADCHNL RA PACING THRESHOLD AMPLITUDE: 0.75 V
MDC IDC MSMT LEADCHNL RA SENSING INTR AMPL: 0.6 mV
MDC IDC MSMT LEADCHNL RV PACING THRESHOLD AMPLITUDE: 0.75 V
MDC IDC SESS DTM: 20180305092152
MDC IDC SET LEADCHNL RV PACING PULSEWIDTH: 0.5 ms
MDC IDC STAT BRADY AP VS PERCENT: 1 %
MDC IDC STAT BRADY AS VS PERCENT: 1 %
Pulse Gen Serial Number: 7899864

## 2016-07-17 ENCOUNTER — Ambulatory Visit (INDEPENDENT_AMBULATORY_CARE_PROVIDER_SITE_OTHER): Payer: Medicare Other | Admitting: Orthopaedic Surgery

## 2016-07-17 DIAGNOSIS — M79621 Pain in right upper arm: Secondary | ICD-10-CM

## 2016-07-17 MED ORDER — LIDOCAINE HCL 1 % IJ SOLN
2.0000 mL | INTRAMUSCULAR | Status: AC | PRN
  Administered 2016-07-17: 2 mL

## 2016-07-17 MED ORDER — METHYLPREDNISOLONE ACETATE 40 MG/ML IJ SUSP
80.0000 mg | INTRAMUSCULAR | Status: AC | PRN
  Administered 2016-07-17: 80 mg

## 2016-07-17 MED ORDER — BUPIVACAINE HCL 0.5 % IJ SOLN
2.0000 mL | INTRAMUSCULAR | Status: AC | PRN
  Administered 2016-07-17: 2 mL via INTRA_ARTICULAR

## 2016-07-17 NOTE — Progress Notes (Signed)
Office Visit Note   Patient: Shawn Frye           Date of Birth: [birth year]           MRN: [id number] Visit Date: 07/17/2016              Requested by: [name], MD [address] [place], [place] [address] PCP: Wende Neighbors, MD   Assessment & Plan: Visit Diagnoses:  Chronic right shoulder pain  Plan: Intra-articular cortisone injection right shoulder for follow up as needed, long discussion regarding different treatment options including even shoulder replacement  Follow-Up Instructions: Return if symptoms worsen or fail to improve.   Orders:  No orders of the defined types were placed in this encounter.  No orders of the defined types were placed in this encounter.     Procedures: Large Joint Inj Date/Time: 07/17/2016 9:57 AM Performed by: Garald Balding Authorized by: Garald Balding   Consent Given by:  Patient Timeout: prior to procedure the correct patient, procedure, and site was verified   Indications:  Pain Location:  Shoulder Site:  R glenohumeral Prep: patient was prepped and draped in usual sterile fashion   Needle Size:  25 G Needle Length:  1.5 inches Approach:  Posterior Ultrasound Guidance: No   Fluoroscopic Guidance: No   Arthrogram: No   Medications:  2 mL lidocaine 1 %; 2 mL bupivacaine 0.5 %; 80 mg methylPREDNISolone acetate 40 MG/ML Aspiration Attempted: No   Patient tolerance:  Patient tolerated the procedure well with no immediate complications     Clinical Data: No additional findings.   Subjective: No chief complaint on file. Mr. Swett has been seen on a number of occasions over several years for evaluation of the osteoarthritis of his right shoulder. He does note limitation of motion and with certain activities. He has a number of comorbidities which might preclude his having surgery. Nonetheless we did discuss surgical intervention as an option for him  HPI  Review of Systems   Objective: Vital Signs: There were no  vitals taken for this visit.  Physical Exam  Ortho Exam. Range of motion right shoulder with abduction and flexion about 90. Logically crepitation with internal/external rotation with limitation of external rotation. Good grip and good release distally. Neurovascular exam intact  Specialty Comments:  No specialty comments available.  Imaging: No results found.   PMFS History: Patient Active Problem List   Diagnosis Date Noted  . Mobitz type 2 second degree atrioventricular block 10/02/2015  . Bradycardia 08/29/2015  . Abdominal aneurysm without mention of rupture 03/21/2011   Past Medical History:  Diagnosis Date  . AAA (abdominal aortic aneurysm) ([place])   . Arthritis   . GERD (gastroesophageal reflux disease)   . Heart block AV second degree    St. Jude pacemaker May 2017 - Dr. [name]  . Infection of bladder   . Joint pain   . Symptomatic bradycardia     Family History  Problem Relation Age of Onset  . Heart disease Father     NOT  before age 67  . Pneumonia Mother     Past Surgical History:  Procedure Laterality Date  . BACK SURGERY    . CATARACT EXTRACTION W/ INTRAOCULAR LENS  IMPLANT, BILATERAL Bilateral   . EP IMPLANTABLE DEVICE N/A 10/02/2015   Procedure: Pacemaker Implant;  Surgeon: Evans Lance, MD;  Location: [place] [facility];  Service: Cardiovascular;  Laterality: N/A;  . EXCISIONAL HEMORRHOIDECTOMY    .  HEMORROIDECTOMY    . INGUINAL HERNIA REPAIR Bilateral 1991  . INSERT / REPLACE / REMOVE PACEMAKER    . JOINT REPLACEMENT    . [place] SURGERY  2005  . TOTAL KNEE ARTHROPLASTY Bilateral 1998 -  2003  . TOTAL SHOULDER REPLACEMENT Left 1999   Partial  . TYMPANOSTOMY TUBE PLACEMENT Right    Social History   Occupational History  . Not on file.   Social History Main Topics  . Smoking status: Former Smoker    Packs/day: 1.00    Years: 15.00    Types: Cigarettes    Quit date: 05/13/1950  . Smokeless tobacco: Never Used  . Alcohol use No  .  Drug use: No  . Sexual activity: No

## 2016-08-29 ENCOUNTER — Ambulatory Visit (INDEPENDENT_AMBULATORY_CARE_PROVIDER_SITE_OTHER): Payer: Medicare Other | Admitting: Otolaryngology

## 2016-08-29 DIAGNOSIS — H6123 Impacted cerumen, bilateral: Secondary | ICD-10-CM | POA: Diagnosis not present

## 2016-09-25 ENCOUNTER — Encounter (INDEPENDENT_AMBULATORY_CARE_PROVIDER_SITE_OTHER): Payer: Self-pay | Admitting: Internal Medicine

## 2016-09-25 ENCOUNTER — Ambulatory Visit (INDEPENDENT_AMBULATORY_CARE_PROVIDER_SITE_OTHER): Payer: Medicare Other | Admitting: Internal Medicine

## 2016-09-25 VITALS — BP 102/62 | HR 64 | Temp 97.4°F | Ht 67.0 in | Wt 152.3 lb

## 2016-09-25 DIAGNOSIS — R195 Other fecal abnormalities: Secondary | ICD-10-CM

## 2016-09-25 NOTE — Patient Instructions (Signed)
Fiber 4 gms OV 4 weeks. Stool diary x 2 weeks.

## 2016-09-25 NOTE — Progress Notes (Signed)
   Subjective:    Patient ID: Shawn Frye, male    DOB: 05/03/1919, 83 y.o.   MRN: [id number]  HPI Referred by Dr. [place] Crane for constipation.  He states several months ago, he has constipation.  He states when he has a BM, he doesn't get the signal to have a BM. He says he had a little seepage.  He is not taking anything for his BMs. He has tried Metamucil for his BM but really could not tell any difference. His stools are firm.  He is not constipated.  He had some stool seepage x 3 days which has now resolved. He does however say he had some seepage yesterday. He is not having any pain.  Appetite is good.  No abdominal pain. He walks but not eveyday.    No change in his stool. Take Activa. Has never had a colonoscopy.    Hx of AAA and 2nd degree block. Pacemaker in place.    Review of Systems Past Medical History:  Diagnosis Date  . AAA (abdominal aortic aneurysm) ([place])   . Arthritis   . GERD (gastroesophageal reflux disease)   . Heart block AV second degree    St. Jude pacemaker May 2017 - Dr. [name]  . Infection of bladder   . Joint pain   . Symptomatic bradycardia     Past Surgical History:  Procedure Laterality Date  . BACK SURGERY    . CATARACT EXTRACTION W/ INTRAOCULAR LENS  IMPLANT, BILATERAL Bilateral   . EP IMPLANTABLE DEVICE N/A 10/02/2015   Procedure: Pacemaker Implant;  Surgeon: Evans Lance, MD;  Location: [place] [facility];  Service: Cardiovascular;  Laterality: N/A;  . EXCISIONAL HEMORRHOIDECTOMY    . HEMORROIDECTOMY    . INGUINAL HERNIA REPAIR Bilateral 1991  . INSERT / REPLACE / REMOVE PACEMAKER    . JOINT REPLACEMENT    . [place] SURGERY  2005  . TOTAL KNEE ARTHROPLASTY Bilateral 1998 -  2003  . TOTAL SHOULDER REPLACEMENT Left 1999   Partial  . TYMPANOSTOMY TUBE PLACEMENT Right     Allergies  Allergen Reactions  . Percocet [Oxycodone-Acetaminophen] Hives  . Noroxin [Norfloxacin] Nausea And Vomiting  . Celebrex [Celecoxib] Rash    Current  Outpatient Prescriptions on File Prior to Visit  Medication Sig Dispense Refill  . omeprazole (PRILOSEC OTC) 20 MG tablet Take 20 mg by mouth daily.     . psyllium (METAMUCIL SMOOTH TEXTURE) 28 % packet Take 1 packet by mouth 2 (two) times daily.     No current facility-administered medications on file prior to visit.        Objective:   Physical Exam Blood pressure 102/62, pulse 64, temperature 97.4 F (36.3 C), height 5\' 7"  (1.702 m), weight 152 lb 4.8 oz (69.1 kg). Alert and oriented. Skin warm and dry. Oral mucosa is moist.   . Sclera anicteric, conjunctivae is pink. Thyroid not enlarged. No cervical lymphadenopathy. Lungs clear. Heart regular rate and rhythm.  Abdomen is soft. Bowel sounds are positive. No hepatomegaly. No abdominal masses felt. No tenderness.  No edema to lower extremities. Stool formed in rectum and guaiac negative.        Assessment & Plan:  Fecal seepage. Am going to try him on Fiber 4 gms po. Stool diary x 2 weeks. May consider a colonoscopy

## 2016-10-14 ENCOUNTER — Encounter (INDEPENDENT_AMBULATORY_CARE_PROVIDER_SITE_OTHER): Payer: Self-pay

## 2016-10-16 ENCOUNTER — Ambulatory Visit (INDEPENDENT_AMBULATORY_CARE_PROVIDER_SITE_OTHER): Payer: Medicare Other | Admitting: Orthopaedic Surgery

## 2016-10-16 ENCOUNTER — Encounter (INDEPENDENT_AMBULATORY_CARE_PROVIDER_SITE_OTHER): Payer: Self-pay | Admitting: Orthopaedic Surgery

## 2016-10-16 VITALS — BP 117/69 | HR 70 | Ht 67.0 in | Wt 155.0 lb

## 2016-10-16 DIAGNOSIS — G8929 Other chronic pain: Secondary | ICD-10-CM

## 2016-10-16 DIAGNOSIS — M25511 Pain in right shoulder: Secondary | ICD-10-CM | POA: Diagnosis not present

## 2016-10-16 MED ORDER — METHYLPREDNISOLONE ACETATE 40 MG/ML IJ SUSP
80.0000 mg | INTRAMUSCULAR | Status: AC | PRN
  Administered 2016-10-16: 80 mg

## 2016-10-16 MED ORDER — BUPIVACAINE HCL 0.5 % IJ SOLN
2.0000 mL | INTRAMUSCULAR | Status: AC | PRN
  Administered 2016-10-16: 2 mL via INTRA_ARTICULAR

## 2016-10-16 MED ORDER — LIDOCAINE HCL 1 % IJ SOLN
2.0000 mL | INTRAMUSCULAR | Status: AC | PRN
  Administered 2016-10-16: 2 mL

## 2016-10-16 NOTE — Progress Notes (Signed)
Office Visit Note   Patient: Shawn Frye           Date of Birth: 08-10-1918           MRN: [id number] Visit Date: 10/16/2016              Requested by: [name], MD [address] [place], [place] [address] PCP: [name], MD   Assessment & Plan: Visit Diagnoses:  Osteoarthritis right shoulder  Plan: Intra-articular cortisone injection, follow-up as needed. Shawn Frye is going to check with his cardiologist regarding possibility of clearance for shoulder surgery  Follow-Up Instructions: Return if symptoms worsen or fail to improve.   Orders:  No orders of the defined types were placed in this encounter.  No orders of the defined types were placed in this encounter.     Procedures: Large Joint Inj Date/Time: 10/16/2016 10:24 AM Performed by: Garald Balding Authorized by: Garald Balding   Consent Given by:  Patient Timeout: prior to procedure the correct patient, procedure, and site was verified   Indications:  Pain Location:  Shoulder Site:  R glenohumeral Prep: patient was prepped and draped in usual sterile fashion   Needle Size:  25 G Needle Length:  1.5 inches Approach:  Posterior Ultrasound Guidance: No   Fluoroscopic Guidance: No   Arthrogram: No   Medications:  2 mL lidocaine 1 %; 2 mL bupivacaine 0.5 %; 80 mg methylPREDNISolone acetate 40 MG/ML Aspiration Attempted: No   Patient tolerance:  Patient tolerated the procedure well with no immediate complications     Clinical Data: No additional findings.   Subjective: Chief Complaint  Patient presents with  . Right Shoulder - Pain    Shawn Frye is a 83 y o that presents with chronic  R shoulder pain. He relates he would rather get a cortisone shot every 2 months but his last one was about 3 months ago.pain localized at R shoulder area    HPI  Review of Systems   Objective: Vital Signs: BP 117/69   Pulse 70   Ht 5\' 7"  (1.702 m)   Wt 155 lb (70.3 kg)   BMI 24.28 kg/m    Physical Exam  Ortho Exam Right shoulder with approximately 80-85 of abduction which point is uncomfortable. Flexion just over 100. Positive crepitation with internal/external rotation about 40 of external rotation good grip and good release. Neurovascular intact distally. Specialty Comments:  No specialty comments available.  Imaging: No results found.   PMFS History: Patient Active Problem List   Diagnosis Date Noted  . Mobitz type 2 second degree atrioventricular block 10/02/2015  . Bradycardia 08/29/2015  . Abdominal aneurysm without mention of rupture 03/21/2011   Past Medical History:  Diagnosis Date  . AAA (abdominal aortic aneurysm) ([place])   . Arthritis   . GERD (gastroesophageal reflux disease)   . Heart block AV second degree    St. Jude pacemaker May 2017 - Dr. [name]  . Infection of bladder   . Joint pain   . Symptomatic bradycardia     Family History  Problem Relation Age of Onset  . Heart disease Father        NOT  before age 84  . Pneumonia Mother   . Cystic fibrosis Paternal Aunt     Past Surgical History:  Procedure Laterality Date  . BACK SURGERY    . CATARACT EXTRACTION W/ INTRAOCULAR LENS  IMPLANT, BILATERAL Bilateral   . EP IMPLANTABLE DEVICE N/A  10/02/2015   Procedure: Pacemaker Implant;  Surgeon: Evans Lance, MD;  Location: [place] [facility];  Service: Cardiovascular;  Laterality: N/A;  . EXCISIONAL HEMORRHOIDECTOMY    . HEMORROIDECTOMY    . INGUINAL HERNIA REPAIR Bilateral 1991  . INSERT / REPLACE / REMOVE PACEMAKER    . JOINT REPLACEMENT    . [place] SURGERY  2005  . TOTAL KNEE ARTHROPLASTY Bilateral 1998 -  2003  . TOTAL SHOULDER REPLACEMENT Left 1999   Partial  . TYMPANOSTOMY TUBE PLACEMENT Right    Social History   Occupational History  . Not on file.   Social History Main Topics  . Smoking status: Former Smoker    Packs/day: 1.00    Years: 15.00    Types: Cigarettes    Quit date: 05/13/1950  . Smokeless tobacco:  Never Used  . Alcohol use No  . Drug use: No  . Sexual activity: No     Garald Balding, MD   Note - This record has been created using [employer].  Chart creation errors have been sought, but may not always  have been located. Such creation errors do not reflect on  the standard of medical care.

## 2016-10-23 ENCOUNTER — Encounter (INDEPENDENT_AMBULATORY_CARE_PROVIDER_SITE_OTHER): Payer: Self-pay | Admitting: Internal Medicine

## 2016-10-23 ENCOUNTER — Ambulatory Visit (INDEPENDENT_AMBULATORY_CARE_PROVIDER_SITE_OTHER): Payer: Medicare Other | Admitting: Internal Medicine

## 2016-10-23 VITALS — BP 126/64 | HR 56 | Temp 98.1°F | Ht 67.0 in | Wt 153.0 lb

## 2016-10-23 DIAGNOSIS — R152 Fecal urgency: Secondary | ICD-10-CM

## 2016-10-23 DIAGNOSIS — R159 Full incontinence of feces: Secondary | ICD-10-CM

## 2016-10-23 NOTE — Patient Instructions (Signed)
Continue the Fiber. Try going to BR on a schedule at same time.  OV in one year.

## 2016-10-23 NOTE — Progress Notes (Signed)
Subjective:    Patient ID: Shawn Frye, male    DOB: Mar 11, 1919, 83 y.o.   MRN: [id number]  HPI Here today for f/u. Seen in around middle of May for constipation. Has had constipation for several months. He actually states he has fecal seepage.  He stated when he has a BM he doesn't have the signal to have a BM . Has tried Metamucil which really did not make a difference. He was guaiac negative in office.  Stools are firm.  I started him on fiber 4gm daily and to do a stool diary.  He tells me his BMs are regular. He doesn't get much of a signal to have a BM. Sometimes he goes to the BR and does not have a BM.  His BMs are formed.  No melena or BRRB.  When he doesn't have the signal to go to the BR, he may have a small amt of leakage. He states this not occur very often.  He states this morning he had a cramp and could not make it to the BR.    Hx of AAA and 2nd degree block. Pacemaker in place.   Review of Systems Past Medical History:  Diagnosis Date  . AAA (abdominal aortic aneurysm) ([place])   . Arthritis   . GERD (gastroesophageal reflux disease)   . Heart block AV second degree    St. Jude pacemaker May 2017 - Dr. [name]  . Infection of bladder   . Joint pain   . Symptomatic bradycardia     Past Surgical History:  Procedure Laterality Date  . BACK SURGERY    . CATARACT EXTRACTION W/ INTRAOCULAR LENS  IMPLANT, BILATERAL Bilateral   . EP IMPLANTABLE DEVICE N/A 10/02/2015   Procedure: Pacemaker Implant;  Surgeon: Evans Lance, MD;  Location: [place] [facility];  Service: Cardiovascular;  Laterality: N/A;  . EXCISIONAL HEMORRHOIDECTOMY    . HEMORROIDECTOMY    . INGUINAL HERNIA REPAIR Bilateral 1991  . INSERT / REPLACE / REMOVE PACEMAKER    . JOINT REPLACEMENT    . [place] SURGERY  2005  . TOTAL KNEE ARTHROPLASTY Bilateral 1998 -  2003  . TOTAL SHOULDER REPLACEMENT Left 1999   Partial  . TYMPANOSTOMY TUBE PLACEMENT Right     Allergies  Allergen Reactions  .  Percocet [Oxycodone-Acetaminophen] Hives  . Noroxin [Norfloxacin] Nausea And Vomiting  . Celebrex [Celecoxib] Rash    Current Outpatient Prescriptions on File Prior to Visit  Medication Sig Dispense Refill  . naproxen sodium (ANAPROX) 220 MG tablet Take 220 mg by mouth as needed.    Marland Kitchen omeprazole (PRILOSEC OTC) 20 MG tablet Take 20 mg by mouth daily.     . psyllium (METAMUCIL SMOOTH TEXTURE) 28 % packet Take 1 packet by mouth 2 (two) times daily.     No current facility-administered medications on file prior to visit.         Objective:   Physical Exam Blood pressure 126/64, pulse (!) 56, temperature 98.1 F (36.7 C), height 5\' 7"  (1.702 m), weight 153 lb (69.4 kg).  Alert and oriented. Skin warm and dry. Oral mucosa is moist.   . Sclera anicteric, conjunctivae is pink. Thyroid not enlarged. No cervical lymphadenopathy. Lungs clear. Heart regular rate and rhythm.  Abdomen is soft. Bowel sounds are positive. No hepatomegaly. No abdominal masses felt. No tenderness.  No edema to lower extremities.         Assessment & Plan:  Fecal incontinence: He can  stop the Metamucil.  Continue the Fiber.  OV in 6 months.

## 2016-10-31 ENCOUNTER — Encounter: Payer: Self-pay | Admitting: Internal Medicine

## 2016-10-31 ENCOUNTER — Ambulatory Visit (INDEPENDENT_AMBULATORY_CARE_PROVIDER_SITE_OTHER): Payer: Medicare Other | Admitting: Internal Medicine

## 2016-10-31 DIAGNOSIS — I441 Atrioventricular block, second degree: Secondary | ICD-10-CM

## 2016-10-31 NOTE — Progress Notes (Signed)
HPI Shawn Frye presents today for ongoing evaluation and management of his PPM. He is a pleasant elderly man with symptomatic heart block, who has done well since his PPM was placed. No edema. He has a AAA which is being followed. No abdominal pain. No syncope. No chest pain.  Allergies  Allergen Reactions  . Percocet [Oxycodone-Acetaminophen] Hives  . Noroxin [Norfloxacin] Nausea And Vomiting  . Celebrex [Celecoxib] Rash     Current Outpatient Prescriptions  Medication Sig Dispense Refill  . Calcium Polycarbophil (FIBER-CAPS PO) Take 4 % by mouth. 4 gms daily    . naproxen sodium (ANAPROX) 220 MG tablet Take 220 mg by mouth as needed.    Marland Kitchen omeprazole (PRILOSEC OTC) 20 MG tablet Take 20 mg by mouth daily.     . psyllium (METAMUCIL SMOOTH TEXTURE) 28 % packet Take 1 packet by mouth 2 (two) times daily.     No current facility-administered medications for this visit.      Past Medical History:  Diagnosis Date  . AAA (abdominal aortic aneurysm) ([place])   . Arthritis   . GERD (gastroesophageal reflux disease)   . Heart block AV second degree    St. Jude pacemaker May 2017 - Dr. [name]  . Infection of bladder   . Joint pain   . Symptomatic bradycardia     ROS:   All systems reviewed and negative except as noted in the HPI.   Past Surgical History:  Procedure Laterality Date  . BACK SURGERY    . CATARACT EXTRACTION W/ INTRAOCULAR LENS  IMPLANT, BILATERAL Bilateral   . EP IMPLANTABLE DEVICE N/A 10/02/2015   Procedure: Pacemaker Implant;  Surgeon: Evans Lance, MD;  Location: [place] [facility];  Service: Cardiovascular;  Laterality: N/A;  . EXCISIONAL HEMORRHOIDECTOMY    . HEMORROIDECTOMY    . INGUINAL HERNIA REPAIR Bilateral 1991  . INSERT / REPLACE / REMOVE PACEMAKER    . JOINT REPLACEMENT    . [place] SURGERY  2005  . TOTAL KNEE ARTHROPLASTY Bilateral 1998 -  2003  . TOTAL SHOULDER REPLACEMENT Left 1999   Partial  . TYMPANOSTOMY TUBE PLACEMENT Right       Family History  Problem Relation Age of Onset  . Heart disease Father        NOT  before age 8  . Pneumonia Mother   . Cystic fibrosis Paternal Aunt   . Kidney disease Neg Hx      Social History   Social History  . Marital status: Married    Spouse name: N/A  . Number of children: N/A  . Years of education: N/A   Occupational History  . Not on file.   Social History Main Topics  . Smoking status: Former Smoker    Packs/day: 1.00    Years: 15.00    Types: Cigarettes    Quit date: 05/13/1950  . Smokeless tobacco: Never Used  . Alcohol use No  . Drug use: No  . Sexual activity: No   Other Topics Concern  . Not on file   Social History Narrative  . No narrative on file     BP 118/69   Pulse 81   Ht 5\' 7"  (1.702 m)   Wt 152 lb (68.9 kg)   BMI 23.81 kg/m   Physical Exam:  stable appearing 83 yo man, NAD HEENT: Unremarkable Neck:  6 cm JVD, no thyromegally Lymphatics:  No adenopathy Back:  No CVA tenderness Lungs:  Clear  with no wheezes HEART:  Regular brady rhythm, no murmurs, no rubs, no clicks Abd:  soft, positive bowel sounds, no organomegally, no rebound, no guarding Ext:  2 plus pulses, no edema, no cyanosis, no clubbing Skin:  No rashes no nodules Neuro:  CN II through XII intact, motor grossly intact  Assess/Plan: 1. Symptomatic high grade AV block - he is s/p PPM and asymptomatic 2. HTN - his blood pressure is minimally elevated. No change in meds. 3. Peripheral vascular disease - he has an aortic aneurysm which is stable. He is asymptomatic. 4. PPM - his St. Jude DDD PM is working normally. Will recheck in several months  Mikle Bosworth.D.

## 2016-10-31 NOTE — Patient Instructions (Signed)
Medication Instructions:    Your physician recommends that you continue on your current medications as directed. Please refer to the Current Medication list given to you today.  --- If you need a refill on your cardiac medications before your next appointment, please call your pharmacy. ---  Labwork:  None ordered  Testing/Procedures:  None ordered  Follow-Up: Remote monitoring is used to monitor your Pacemaker of ICD from home. This monitoring reduces the number of office visits required to check your device to one time per year. It allows us to keep an eye on the functioning of your device to ensure it is working properly. You are scheduled for a device check from home on 01/30/2017. You may send your transmission at any time that day. If you have a wireless device, the transmission will be sent automatically. After your physician reviews your transmission, you will receive a postcard with your next transmission date.   Your physician wants you to follow-up in: 1 year with Dr. Taylor.  You will receive a reminder letter in the mail two months in advance. If you don't receive a letter, please call our office to schedule the follow-up appointment.   Any Other Special Instructions Will Be Listed Below (If Applicable).   Thank you for choosing [facility]!!      

## 2016-11-07 NOTE — Progress Notes (Signed)
Cardiology Office Note  Date: 11/08/2016   ID: LACOREY Shawn Frye, DOB August 03, 1918, MRN [id number]  PCP: [name], MD  Primary Cardiologist: Rozann Lesches, MD   Chief Complaint  Patient presents with  . Cardiac follow-up    History of Present Illness: Shawn Frye is a 83 y.o. male last seen in June 2017. He is here today for a routine follow-up visit. Does not report any significant dizziness or syncope, no chest pain. Has chronic stable dyspnea on exertion that is intermittent, usually in the mornings.  He continues to follow in the device clinic with Dr. Lovena Shawn Frye, [place] pacemaker in place with history of second degree heart block and symptomatic bradycardia.  He continues to follow with [facility] for management of AAA. Most recent Doppler imaging is noted below with overall stable findings.  I personally reviewed his ECG today which shows a ventricular paced rhythm.  Past Medical History:  Diagnosis Date  . AAA (abdominal aortic aneurysm) (Shawn Frye)   . Arthritis   . GERD (gastroesophageal reflux disease)   . Heart block AV second degree    St. Jude pacemaker May 2017 - Dr. Lovena Shawn Frye  . Infection of bladder   . Joint pain   . Symptomatic bradycardia     Past Surgical History:  Procedure Laterality Date  . BACK SURGERY    . CATARACT EXTRACTION W/ INTRAOCULAR LENS  IMPLANT, BILATERAL Bilateral   . EP IMPLANTABLE DEVICE N/A 10/02/2015   Procedure: Pacemaker Implant;  Surgeon: Shawn Lance, MD;  Location: [facility];  Service: Cardiovascular;  Laterality: N/A;  . EXCISIONAL HEMORRHOIDECTOMY    . HEMORROIDECTOMY    . INGUINAL HERNIA REPAIR Bilateral 1991  . INSERT / REPLACE / REMOVE PACEMAKER    . JOINT REPLACEMENT    . [place] SURGERY  2005  . TOTAL KNEE ARTHROPLASTY Bilateral 1998 -  2003  . TOTAL SHOULDER REPLACEMENT Left 1999   Partial  . TYMPANOSTOMY TUBE PLACEMENT Right     Current Outpatient Prescriptions  Medication Sig Dispense Refill  . naproxen  sodium (ANAPROX) 220 MG tablet Take 220 mg by mouth as needed.    Marland Kitchen omeprazole (PRILOSEC OTC) 20 MG tablet Take 20 mg by mouth daily.     . psyllium (METAMUCIL SMOOTH TEXTURE) 28 % packet Take 1 packet by mouth 2 (two) times daily.     No current facility-administered medications for this visit.    Allergies:  Percocet [oxycodone-acetaminophen]; Noroxin [norfloxacin]; and Celebrex [celecoxib]   Social History: The patient  reports that he quit smoking about 66 years ago. His smoking use included Cigarettes. He has a 15.00 pack-year smoking history. He has never used smokeless tobacco. He reports that he does not drink alcohol or use drugs.   ROS:  Please see the history of present illness. Otherwise, complete review of systems is positive for hearing loss, restless legs.  All other systems are reviewed and negative.   Physical Exam: VS:  BP 124/66   Pulse 79   Ht _0  (1.702 m)   Wt 152 lb (68.9 kg)   SpO2 99%   BMI 23.81 kg/m , BMI Body mass index is 23.81 kg/m.  Wt Readings from Last 3 Encounters:  11/08/16 152 lb (68.9 kg)  10/31/16 152 lb (68.9 kg)  10/23/16 153 lb (69.4 kg)    General: Elderly male in no distress. HEENT: Conjunctiva and lids normal, oropharynx clear. Neck: Supple, no elevated JVP or carotid bruits, no thyromegaly.  Thorax: Well-healed left upper chest device pocket site. Lungs: Clear to auscultation, nonlabored breathing at rest. Cardiac: Regular rate and rhythm, no S3, soft systolic murmur, no pericardial rub. Abdomen: Soft, nontender, bowel sounds present. Extremities: No pitting edema, distal pulses 2+.  ECG: I personally reviewed the tracing from 10/03/2015 which showed ventricular pacing with atrial sensing.  Recent Labwork: 12/13/2015: Creatinine, Ser 0.90   Other Studies Reviewed Today:  Abdominal ultrasound 06/26/2015: Aneurysmal dilatation of the mid to distal abdominal aorta measuring 5.6 cm, stable in comparison to July 2017.  Echocardiogram  08/30/2015: Study Conclusions  - Left ventricle: The cavity size was normal. Wall thickness was   normal. Systolic function was normal. The estimated ejection   fraction was in the range of 50% to 55%. Wall motion was normal;   there were no regional wall motion abnormalities. Doppler   parameters are consistent with abnormal left ventricular   relaxation (grade 1 diastolic dysfunction). - Aortic valve: Mildly calcified annulus. Trileaflet; mildly   calcified leaflets. Left coronary cusp mobility was mildly   restricted. There was mild regurgitation. - Aortic root: The aortic root was mildly ectatic. - Mitral valve: Calcified annulus. There was trivial regurgitation. - Left atrium: The atrium was mildly dilated. - Right atrium: Central venous pressure (est): 3 mm Hg. - Tricuspid valve: There was mild regurgitation. - Pulmonary arteries: PA peak pressure: 28 mm Hg (S). - Pericardium, extracardiac: There was no pericardial effusion.  Impressions:  - Normal LV wall thickness with LVEF 50-55%. Grade 1 diastolic   dysfunction with normal estimated LV filling pressure. Mild left   atrial enlargement. MAC with trivial mitral regurgitation.   Sclerotic aortic valve without stenosis. Mild aortic   regurgitation. Mild tricuspid regurgitation with PASP 28 mmHg.  Assessment and Plan:  1. History of symptom bradycardia with second degree heart block status post St. Jude pacemaker, follows with Dr. Lovena Shawn Frye. No dizziness or syncope.  2. Abdominal aortic aneurysm, follows with Dr. Donnetta Frye. Most recent imaging revealed 5.6 cm size. He is asymptomatic.  Current medicines were reviewed with the patient today.   Orders Placed This Encounter  Procedures  . EKG 12-Lead    Disposition: Follow-up in one year, sooner if needed.  Signed, Satira Sark, MD, [facility] 11/08/2016 2:21 PM     [facility] [address] [place], [place] [address] Phone: [phone]; Fax: [phone]

## 2016-11-08 ENCOUNTER — Ambulatory Visit (INDEPENDENT_AMBULATORY_CARE_PROVIDER_SITE_OTHER): Payer: Medicare Other | Admitting: Cardiology

## 2016-11-08 ENCOUNTER — Encounter: Payer: Self-pay | Admitting: Cardiology

## 2016-11-08 VITALS — BP 124/66 | HR 79 | Ht 67.0 in | Wt 152.0 lb

## 2016-11-08 DIAGNOSIS — R001 Bradycardia, unspecified: Secondary | ICD-10-CM

## 2016-11-08 DIAGNOSIS — I714 Abdominal aortic aneurysm, without rupture, unspecified: Secondary | ICD-10-CM

## 2016-11-08 NOTE — Patient Instructions (Signed)
Your physician wants you to follow-up in: 1 Year with Dr. Domenic Polite. You will receive a reminder letter in the mail two months in advance. If you don't receive a letter, please call our office to schedule the follow-up appointment.  Your physician recommends that you continue on your current medications as directed. Please refer to the Current Medication list given to you today.  If you need a refill on your cardiac medications before your next appointment, please call your pharmacy.  Thank you for choosing [facility]!

## 2016-11-19 LAB — CUP PACEART INCLINIC DEVICE CHECK
Battery Remaining Longevity: 103 mo
Battery Voltage: 2.99 V
Brady Statistic RA Percent Paced: 9.6 %
Brady Statistic RV Percent Paced: 99.61 %
Date Time Interrogation Session: 20180621190628
Implantable Lead Implant Date: 20170522
Implantable Lead Location: 753859
Implantable Lead Location: 753860
Implantable Pulse Generator Implant Date: 20170522
Lead Channel Impedance Value: 462.5 Ohm
Lead Channel Pacing Threshold Amplitude: 0.75 V
Lead Channel Pacing Threshold Pulse Width: 0.5 ms
Lead Channel Pacing Threshold Pulse Width: 0.5 ms
Lead Channel Pacing Threshold Pulse Width: 0.5 ms
Lead Channel Setting Pacing Amplitude: 2.5 V
Lead Channel Setting Sensing Sensitivity: 2 mV
MDC IDC LEAD IMPLANT DT: 20170522
MDC IDC MSMT LEADCHNL RA IMPEDANCE VALUE: 387.5 Ohm
MDC IDC MSMT LEADCHNL RA PACING THRESHOLD AMPLITUDE: 0.75 V
MDC IDC MSMT LEADCHNL RA SENSING INTR AMPL: 0.7 mV
MDC IDC MSMT LEADCHNL RV PACING THRESHOLD AMPLITUDE: 0.75 V
MDC IDC MSMT LEADCHNL RV PACING THRESHOLD AMPLITUDE: 0.75 V
MDC IDC MSMT LEADCHNL RV PACING THRESHOLD PULSEWIDTH: 0.5 ms
MDC IDC MSMT LEADCHNL RV SENSING INTR AMPL: 10.8 mV
MDC IDC SET LEADCHNL RA PACING AMPLITUDE: 2 V
MDC IDC SET LEADCHNL RV PACING PULSEWIDTH: 0.5 ms
Pulse Gen Model: 2272
Pulse Gen Serial Number: 7899864

## 2016-11-21 DIAGNOSIS — I10 Essential (primary) hypertension: Secondary | ICD-10-CM | POA: Diagnosis not present

## 2016-11-21 DIAGNOSIS — N400 Enlarged prostate without lower urinary tract symptoms: Secondary | ICD-10-CM | POA: Diagnosis not present

## 2016-11-25 DIAGNOSIS — K219 Gastro-esophageal reflux disease without esophagitis: Secondary | ICD-10-CM | POA: Diagnosis not present

## 2016-11-25 DIAGNOSIS — D509 Iron deficiency anemia, unspecified: Secondary | ICD-10-CM | POA: Diagnosis not present

## 2016-11-25 DIAGNOSIS — M25511 Pain in right shoulder: Secondary | ICD-10-CM | POA: Diagnosis not present

## 2016-11-25 DIAGNOSIS — I714 Abdominal aortic aneurysm, without rupture: Secondary | ICD-10-CM | POA: Diagnosis not present

## 2016-11-25 DIAGNOSIS — I499 Cardiac arrhythmia, unspecified: Secondary | ICD-10-CM | POA: Diagnosis not present

## 2016-12-05 ENCOUNTER — Encounter: Payer: Self-pay | Admitting: Family

## 2016-12-24 ENCOUNTER — Ambulatory Visit (HOSPITAL_COMMUNITY)
Admission: RE | Admit: 2016-12-24 | Discharge: 2016-12-24 | Disposition: A | Discharge status: Home / Self Care | Payer: Medicare Other | Source: Ambulatory Visit | Attending: Vascular Surgery | Admitting: Vascular Surgery

## 2016-12-24 ENCOUNTER — Encounter: Payer: Self-pay | Admitting: Family

## 2016-12-24 ENCOUNTER — Ambulatory Visit (INDEPENDENT_AMBULATORY_CARE_PROVIDER_SITE_OTHER): Payer: Medicare Other | Admitting: Family

## 2016-12-24 VITALS — BP 123/73 | HR 75 | Temp 97.0°F | Resp 16 | Ht 67.0 in | Wt 149.4 lb

## 2016-12-24 DIAGNOSIS — I714 Abdominal aortic aneurysm, without rupture, unspecified: Secondary | ICD-10-CM

## 2016-12-24 NOTE — Progress Notes (Signed)
[facility]   CC: Follow up Abdominal Aortic Aneurysm  History of Present Illness  Shawn Frye is a 83 y.o. (1918/09/21) male patient of Dr. Donnetta Hutching with known AAA, returns today for routine surveillance.  He has a mild back ache in the mornings which resolves with activity, he denies new back pain, denies abdominal pain.   Dr. Donnetta Hutching last evaluated pt on 06-25-16. At that time abdominal duplex that day showed maximal diameter 5.6 cm compared to maximal diameter of 5.7 cm in July 2017. Dr. Donnetta Hutching discussed with patient and his wife that fortunately no evidence of growth associated with his aneurysm found. Did explain the approximate 5% per year risk of rupture. With his advanced age of 41 I would certainly try to be conservative if possible. Will see him again in 6 months with continued ultrasound surveillance. Again discussed symptoms of leaking aneurysm he knows to present immediately to the emergency room should this occur.  He denies any cardiac problems.  He denies any claudication symptoms, denies non-healing wounds.  He has 3+ palpable bilateral popliteal pulses. January 2016 popliteal arteries duplex showed no evidence of popliteal aneurysms nor stenosis.  He denies any history of stroke or TIA symptoms.  Most of his surgeries were related to OA issues.  He states he has had minor back surgery many years ago.   States he is active physically, does not drink ETOH.   He denies feeling short of breath, denies cough.   He had a pacemaker inserted in May 2017 by Dr. [name] for second degree heart block; states he feels better since then.  Pt Diabetic: No Pt smoker: non-smoker  Pt meds include:  Statin :No, states his cholesterol is good ASA: No Other anticoagulants/antiplatelets: no      Past Medical History:  Diagnosis Date  . AAA (abdominal aortic aneurysm) ([place])   . Arthritis   . GERD (gastroesophageal reflux disease)   . Heart  block AV second degree    St. Jude pacemaker May 2017 - Dr. [name]  . Infection of bladder   . Joint pain   . Symptomatic bradycardia    Past Surgical History:  Procedure Laterality Date  . BACK SURGERY    . CATARACT EXTRACTION W/ INTRAOCULAR LENS  IMPLANT, BILATERAL Bilateral   . EP IMPLANTABLE DEVICE N/A 10/02/2015   Procedure: Pacemaker Implant;  Surgeon: Evans Lance, MD;  Location: [facility];  Service: Cardiovascular;  Laterality: N/A;  . EXCISIONAL HEMORRHOIDECTOMY    . HEMORROIDECTOMY    . INGUINAL HERNIA REPAIR Bilateral 1991  . INSERT / REPLACE / REMOVE PACEMAKER    . JOINT REPLACEMENT    . [place] SURGERY  2005  . TOTAL KNEE ARTHROPLASTY Bilateral 1998 -  2003  . TOTAL SHOULDER REPLACEMENT Left 1999   Partial  . TYMPANOSTOMY TUBE PLACEMENT Right    Social History Social History   Social History  . Marital status: Married    Spouse name: N/A  . Number of children: N/A  . Years of education: N/A   Occupational History  . Not on file.   Social History Main Topics  . Smoking status: Former Smoker    Packs/day: 1.00    Years: 15.00    Types: Cigarettes    Quit date: 05/13/1950  . Smokeless tobacco: Never Used  . Alcohol use No  . Drug use: No  . Sexual activity: No   Other Topics Concern  . Not on file  Social History Narrative  . No narrative on file   Family History Family History  Problem Relation Age of Onset  . Heart disease Father        NOT  before age 37  . Pneumonia Mother   . Cystic fibrosis Paternal Aunt   . Kidney disease Neg Hx     Current Outpatient Prescriptions on File Prior to Visit  Medication Sig Dispense Refill  . naproxen sodium (ANAPROX) 220 MG tablet Take 220 mg by mouth as needed.    Marland Kitchen omeprazole (PRILOSEC OTC) 20 MG tablet Take 20 mg by mouth daily.     . psyllium (METAMUCIL SMOOTH TEXTURE) 28 % packet Take 1 packet by mouth 2 (two) times daily.     No current facility-administered medications on file prior  to visit.    Allergies  Allergen Reactions  . Percocet [Oxycodone-Acetaminophen] Hives  . Noroxin [Norfloxacin] Nausea And Vomiting  . Celebrex [Celecoxib] Rash    ROS: See HPI for pertinent positives and negatives.  Physical Examination  Vitals:   12/24/16 0955  BP: 123/73  Pulse: 75  Resp: 16  Temp: (!) 97 F (36.1 C)  TempSrc: Oral  SpO2: 93%  Weight: 149 lb 6.4 oz (67.8 kg)  Height: 5\' 7"  (1.702 m)   Body mass index is 23.4 kg/m.  General: WDWN in NAD  Gait: Normal  HENT: WNL  Eyes: Pupils equal  Pulmonary: normal non-labored breathing, without rhonchi or wheezing, + rales left posterior fields (lower half).  Cardiac: RRR with occasional missed contractions, no appreciable murmurs. Pacemaker subcutaneous at left upper chest. Abdomen: soft, NT, no palpable masses  Skin: no rashes, no ulcers, no cellulitis.  Vascular Exam/Pulses:  VASCULAR EXAM Carotid Bruits Left Right   Negative  Negative    Abdomina aortic pulse is strongly palpable  Radial pulses are 2+ and =   VASCULAR EXAM: Extremitieswithout ischemic changes  without Gangrene; without open wounds.   LE Pulses  LEFT  RIGHT   FEMORAL  2+palpable 2+palpable   POPLITEAL  3+palpable  3+palpable  POSTERIOR TIBIAL  2+ palpable  1+ palpable   DORSALIS PEDIS ANTERIOR TIBIAL  1+ palpable  faintly palpable    Musculoskeletal: age appropriate muscle wasting or atrophy; trace peripheral edema   Neurologic:A&O X 3; appropriate affect,MOTOR FUNCTION: 5/5 Symmetric, CN 2-12 intact except for hard of hearing, wearing bilateral hearing aids.  Speech is fluent/normal.   Non-Invasive Vascular Imaging  AAA Duplex (12/24/2016)  Previous size: 5.6 cm (Date: 06-26-15)  Current size:  5.5 cm (Date: 12-24-16); Right CIA: 1.31 cm; Left CIA: 1.9 cm  Medical Decision Making  The patient is a 83 y.o. male who presents with  asymptomatic AAA with no increase in size.  I advised pt and his wife to consult his PCP re asymptomatic rales in left posterior fields.    Based on this patient's exam and diagnostic studies, the patient will follow up in 6 months with the following studies: AAA duplex.        The patient was given information about AAA including signs, symptoms, treatment, and how to minimize the risk of enlargement and rupture of aneurysms.    I emphasized the importance of maximal medical management including strict control of blood pressure, blood glucose, and lipid levels, antiplatelet agents, obtaining regular exercise, and continued cessation of smoking.   The patient was advised to call 911 should the patient experience sudden onset abdominal or back pain.   Thank you for allowing [place] to  participate in this patient's care.  Clemon Chambers, RN, MSN, FNP-C [facility] Office: [phone]  Clinic Physician: Early  12/24/2016, 10:24 AM

## 2016-12-24 NOTE — Patient Instructions (Signed)
Abdominal Aortic Aneurysm Blood pumps away from the heart through tubes (blood vessels) called arteries. Aneurysms are weak or damaged places in the wall of an artery. It bulges out like a balloon. An abdominal aortic aneurysm happens in the main artery of the body (aorta). It can burst or tear, causing bleeding inside the body. This is an emergency. It needs treatment right away. What are the causes? The exact cause is unknown. Things that could cause this problem include:  Fat and other substances building up in the lining of a tube.  Swelling of the walls of a blood vessel.  Certain tissue diseases.  Belly (abdominal) trauma.  An infection in the main artery of the body.  What increases the risk? There are things that make it more likely for you to have an aneurysm. These include:  Being over the age of 81 years old.  Having high blood pressure (hypertension).  Being a male.  Being white.  Being very overweight (obese).  Having a family history of aneurysm.  Using tobacco products.  What are the signs or symptoms? Symptoms depend on the size of the aneurysm and how fast it grows. There may not be symptoms. If symptoms occur, they can include:  Pain (belly, side, lower back, or groin).  Feeling full after eating a small amount of food.  Feeling sick to your stomach (nauseous), throwing up (vomiting), or both.  Feeling a lump in your belly that feels like it is beating (pulsating).  Feeling like you will pass out (faint).  How is this treated?  Medicine to control blood pressure and pain.  Imaging tests to see if the aneurysm gets bigger.  Surgery. How is this prevented? To lessen your chance of getting this condition:  Stop smoking. Stop chewing tobacco.  Limit or avoid alcohol.  Keep your blood pressure, blood sugar, and cholesterol within normal limits.  Eat less salt.  Eat foods low in saturated fats and cholesterol. These are found in animal and  whole dairy products.  Eat more fiber. Fiber is found in whole grains, vegetables, and fruits.  Keep a healthy weight.  Stay active and exercise often.  This information is not intended to replace advice given to you by your health care provider. Make sure you discuss any questions you have with your health care provider. Document Released: 08/24/2012 Document Revised: 10/05/2015 Document Reviewed: 05/29/2012 Elsevier Interactive Patient Education  2017 Elsevier Inc.  

## 2016-12-26 NOTE — Addendum Note (Signed)
Addended by: Lianne Cure A on: 12/26/2016 08:54 AM   Modules accepted: Orders

## 2017-01-08 ENCOUNTER — Ambulatory Visit (INDEPENDENT_AMBULATORY_CARE_PROVIDER_SITE_OTHER): Payer: Medicare Other | Admitting: Orthopaedic Surgery

## 2017-01-08 DIAGNOSIS — M19011 Primary osteoarthritis, right shoulder: Secondary | ICD-10-CM | POA: Diagnosis not present

## 2017-01-08 MED ORDER — LIDOCAINE HCL 1 % IJ SOLN
2.0000 mL | INTRAMUSCULAR | Status: AC | PRN
  Administered 2017-01-08: 2 mL

## 2017-01-08 MED ORDER — METHYLPREDNISOLONE ACETATE 40 MG/ML IJ SUSP
80.0000 mg | INTRAMUSCULAR | Status: AC | PRN
  Administered 2017-01-08: 80 mg

## 2017-01-08 MED ORDER — BUPIVACAINE HCL 0.5 % IJ SOLN
2.0000 mL | INTRAMUSCULAR | Status: AC | PRN
  Administered 2017-01-08: 2 mL via INTRA_ARTICULAR

## 2017-01-08 NOTE — Progress Notes (Signed)
Office Visit Note   Patient: Shawn Frye           Date of Birth: [birth year]           MRN: [id number] Visit Date: 01/08/2017              Requested by: [name], MD [address] [place], [place] [address] PCP: [name], MD   Assessment & Plan: Visit Diagnoses:  1. Primary osteoarthritis, right shoulder     Plan: Repeat cortisone injection right shoulder  Follow-Up Instructions: Return if symptoms worsen or fail to improve.   Orders:  No orders of the defined types were placed in this encounter.  No orders of the defined types were placed in this encounter.     Procedures: Large Joint Inj Date/Time: 01/08/2017 2:37 PM Performed by: Garald Balding Authorized by: Garald Balding   Consent Given by:  Patient Timeout: prior to procedure the correct patient, procedure, and site was verified   Indications:  Pain Location:  Shoulder Site:  R glenohumeral Prep: patient was prepped and draped in usual sterile fashion   Needle Size:  25 G Needle Length:  1.5 inches Approach:  Posterior Ultrasound Guidance: No   Fluoroscopic Guidance: No   Arthrogram: No   Medications:  2 mL lidocaine 1 %; 2 mL bupivacaine 0.5 %; 80 mg methylPREDNISolone acetate 40 MG/ML Aspiration Attempted: No   Patient tolerance:  Patient tolerated the procedure well with no immediate complications     Clinical Data: No additional findings.   Subjective: No chief complaint on file. Loss cortisone injection right shoulder about 3 months ago. Having some recurrent pain which limits his activity. Wishes to have another cortisone. Does not have any fever or chills shortness of breath or chest pain. No numbness or tingling or problems referable to his cervical spine. He is hard of hearing  HPI  Review of Systems   Objective: Vital Signs: There were no vitals taken for this visit.  Physical Exam  Ortho Exam limitation of motion of right shoulder and flexion external  rotation and abduction. Able to get his hand to his mouth and his face and the top of his head. Some crepitation with some shoulder motion. Skin intact except for senile lesions. Her vascular exam intact.  Specialty Comments:  No specialty comments available.  Imaging: No results found.   PMFS History: Patient Active Problem List   Diagnosis Date Noted  . Primary osteoarthritis, right shoulder 01/08/2017  . Mobitz type 2 second degree atrioventricular block 10/02/2015  . Bradycardia 08/29/2015  . Abdominal aneurysm without mention of rupture 03/21/2011   Past Medical History:  Diagnosis Date  . AAA (abdominal aortic aneurysm) ([place])   . Arthritis   . GERD (gastroesophageal reflux disease)   . Heart block AV second degree    St. Jude pacemaker May 2017 - Dr. [name]  . Infection of bladder   . Joint pain   . Symptomatic bradycardia     Family History  Problem Relation Age of Onset  . Heart disease Father        NOT  before age 22  . Pneumonia Mother   . Cystic fibrosis Paternal Aunt   . Kidney disease Neg Hx     Past Surgical History:  Procedure Laterality Date  . BACK SURGERY    . CATARACT EXTRACTION W/ INTRAOCULAR LENS  IMPLANT, BILATERAL Bilateral   . EP IMPLANTABLE DEVICE N/A 10/02/2015   Procedure: Pacemaker  Implant;  Surgeon: Evans Lance, MD;  Location: [facility];  Service: Cardiovascular;  Laterality: N/A;  . EXCISIONAL HEMORRHOIDECTOMY    . HEMORROIDECTOMY    . INGUINAL HERNIA REPAIR Bilateral 1991  . INSERT / REPLACE / REMOVE PACEMAKER    . JOINT REPLACEMENT    . [place] SURGERY  2005  . TOTAL KNEE ARTHROPLASTY Bilateral 1998 -  2003  . TOTAL SHOULDER REPLACEMENT Left 1999   Partial  . TYMPANOSTOMY TUBE PLACEMENT Right    Social History   Occupational History  . Not on file.   Social History Main Topics  . Smoking status: Former Smoker    Packs/day: 1.00    Years: 15.00    Types: Cigarettes    Quit date: 05/13/1950  . Smokeless  tobacco: Never Used  . Alcohol use No  . Drug use: No  . Sexual activity: No     Garald Balding, MD   Note - This record has been created using [employer].  Chart creation errors have been sought, but may not always  have been located. Such creation errors do not reflect on  the standard of medical care.

## 2017-01-30 ENCOUNTER — Ambulatory Visit (INDEPENDENT_AMBULATORY_CARE_PROVIDER_SITE_OTHER): Payer: Medicare Other | Admitting: *Deleted

## 2017-01-30 DIAGNOSIS — I441 Atrioventricular block, second degree: Secondary | ICD-10-CM

## 2017-01-30 NOTE — Progress Notes (Signed)
Remote pacemaker transmission.   

## 2017-01-31 ENCOUNTER — Encounter: Payer: Self-pay | Admitting: Cardiology

## 2017-02-03 LAB — CUP PACEART REMOTE DEVICE CHECK
Battery Remaining Longevity: 102 mo
Battery Remaining Percentage: 95.5 %
Brady Statistic AS VP Percent: 94 %
Implantable Lead Implant Date: 20170522
Implantable Lead Location: 753860
Implantable Pulse Generator Implant Date: 20170522
Lead Channel Impedance Value: 390 Ohm
Lead Channel Pacing Threshold Amplitude: 0.75 V
Lead Channel Pacing Threshold Pulse Width: 0.5 ms
Lead Channel Sensing Intrinsic Amplitude: 0.6 mV
Lead Channel Sensing Intrinsic Amplitude: 12 mV
Lead Channel Setting Pacing Amplitude: 2 V
Lead Channel Setting Pacing Amplitude: 2.5 V
Lead Channel Setting Pacing Pulse Width: 0.5 ms
MDC IDC LEAD IMPLANT DT: 20170522
MDC IDC LEAD LOCATION: 753859
MDC IDC MSMT BATTERY VOLTAGE: 2.99 V
MDC IDC MSMT LEADCHNL RA PACING THRESHOLD AMPLITUDE: 0.75 V
MDC IDC MSMT LEADCHNL RA PACING THRESHOLD PULSEWIDTH: 0.5 ms
MDC IDC MSMT LEADCHNL RV IMPEDANCE VALUE: 450 Ohm
MDC IDC PG SERIAL: 7899864
MDC IDC SESS DTM: 20180920080015
MDC IDC SET LEADCHNL RV SENSING SENSITIVITY: 2 mV
MDC IDC STAT BRADY AP VP PERCENT: 6.2 %
MDC IDC STAT BRADY AP VS PERCENT: 1 %
MDC IDC STAT BRADY AS VS PERCENT: 1 %
MDC IDC STAT BRADY RA PERCENT PACED: 5.9 %
MDC IDC STAT BRADY RV PERCENT PACED: 99 %
Pulse Gen Model: 2272

## 2017-02-04 DIAGNOSIS — Z23 Encounter for immunization: Secondary | ICD-10-CM | POA: Diagnosis not present

## 2017-02-24 ENCOUNTER — Ambulatory Visit (INDEPENDENT_AMBULATORY_CARE_PROVIDER_SITE_OTHER): Payer: Medicare Other | Admitting: Otolaryngology

## 2017-02-24 DIAGNOSIS — H6981 Other specified disorders of Eustachian tube, right ear: Secondary | ICD-10-CM

## 2017-02-24 DIAGNOSIS — H6123 Impacted cerumen, bilateral: Secondary | ICD-10-CM | POA: Diagnosis not present

## 2017-02-24 DIAGNOSIS — H903 Sensorineural hearing loss, bilateral: Secondary | ICD-10-CM | POA: Diagnosis not present

## 2017-03-04 ENCOUNTER — Ambulatory Visit (INDEPENDENT_AMBULATORY_CARE_PROVIDER_SITE_OTHER): Payer: Medicare Other | Admitting: Urology

## 2017-03-04 DIAGNOSIS — R3915 Urgency of urination: Secondary | ICD-10-CM | POA: Diagnosis not present

## 2017-03-04 DIAGNOSIS — N402 Nodular prostate without lower urinary tract symptoms: Secondary | ICD-10-CM

## 2017-04-16 ENCOUNTER — Encounter (INDEPENDENT_AMBULATORY_CARE_PROVIDER_SITE_OTHER): Payer: Self-pay | Admitting: Orthopaedic Surgery

## 2017-04-16 ENCOUNTER — Ambulatory Visit (INDEPENDENT_AMBULATORY_CARE_PROVIDER_SITE_OTHER): Payer: Medicare Other | Admitting: Orthopaedic Surgery

## 2017-04-16 DIAGNOSIS — M25511 Pain in right shoulder: Secondary | ICD-10-CM | POA: Diagnosis not present

## 2017-04-16 DIAGNOSIS — G8929 Other chronic pain: Secondary | ICD-10-CM | POA: Diagnosis not present

## 2017-04-16 MED ORDER — METHYLPREDNISOLONE ACETATE 40 MG/ML IJ SUSP
80.0000 mg | INTRAMUSCULAR | Status: AC | PRN
  Administered 2017-04-16: 80 mg

## 2017-04-16 MED ORDER — LIDOCAINE HCL (PF) 1 % IJ SOLN
2.0000 mL | INTRAMUSCULAR | Status: AC | PRN
  Administered 2017-04-16: 2 mL

## 2017-04-16 MED ORDER — BUPIVACAINE HCL 0.25 % IJ SOLN
2.0000 mL | INTRAMUSCULAR | Status: AC | PRN
  Administered 2017-04-16: 2 mL via INTRA_ARTICULAR

## 2017-04-16 NOTE — Progress Notes (Signed)
Office Visit Note   Patient: Shawn Frye           Date of Birth: [birth year]           MRN: [id number] Visit Date: 04/16/2017              Requested by: [name], MD [place], [place] [address] PCP: [name], MD   Assessment & Plan: Visit Diagnoses:  1. Chronic right shoulder pain     Plan: Primary osteoarthritis right shoulder. Repeat cortisone injection. Have discussed total shoulder replacement as an option but he really needs to check with his primary care physician and see if he can have the surgery. He is compromised in his activities and that is the only definitive treatment. His age might preclude any surgery  Follow-Up Instructions: Return if symptoms worsen or fail to improve.   Orders:  No orders of the defined types were placed in this encounter.  No orders of the defined types were placed in this encounter.     Procedures: Large Joint Inj: R glenohumeral on 04/16/2017 1:19 PM Indications: pain Details: 25 G 1.5 in needle, anterior approach Medications: 2 mL lidocaine (PF) 1 %; 2 mL bupivacaine 0.25 %; 80 mg methylPREDNISolone acetate 40 MG/ML      Clinical Data: No additional findings.   Subjective: No chief complaint on file. Still having considerable pain in his right shoulder on a recurrent basis. Prior films consistent with primary osteoarthritis  HPI  Review of Systems   Objective: Vital Signs: There were no vitals taken for this visit.  Physical Exam  Ortho Exam awake alert and oriented 3. Hard of hearing. 90 of abduction and flexion right shoulder passively which is combined good grip and good release. Positive crepitation  Specialty Comments:  No specialty comments available.  Imaging: No results found.   PMFS History: Patient Active Problem List   Diagnosis Date Noted  . Primary osteoarthritis, right shoulder 01/08/2017  . Mobitz type 2 second degree atrioventricular block 10/02/2015  . Bradycardia  08/29/2015  . Abdominal aneurysm without mention of rupture 03/21/2011   Past Medical History:  Diagnosis Date  . AAA (abdominal aortic aneurysm) ([place])   . Arthritis   . GERD (gastroesophageal reflux disease)   . Heart block AV second degree    St. Jude pacemaker May 2017 - Dr. [name]  . Infection of bladder   . Joint pain   . Symptomatic bradycardia     Family History  Problem Relation Age of Onset  . Heart disease Father        NOT  before age 67  . Pneumonia Mother   . Cystic fibrosis Paternal Aunt   . Kidney disease Neg Hx     Past Surgical History:  Procedure Laterality Date  . BACK SURGERY    . CATARACT EXTRACTION W/ INTRAOCULAR LENS  IMPLANT, BILATERAL Bilateral   . EP IMPLANTABLE DEVICE N/A 10/02/2015   Procedure: Pacemaker Implant;  Surgeon: Evans Lance, MD;  Location: [facility];  Service: Cardiovascular;  Laterality: N/A;  . EXCISIONAL HEMORRHOIDECTOMY    . HEMORROIDECTOMY    . INGUINAL HERNIA REPAIR Bilateral 1991  . INSERT / REPLACE / REMOVE PACEMAKER    . JOINT REPLACEMENT    . [place] SURGERY  2005  . TOTAL KNEE ARTHROPLASTY Bilateral 1998 -  2003  . TOTAL SHOULDER REPLACEMENT Left 1999   Partial  . TYMPANOSTOMY TUBE PLACEMENT Right  Social History   Occupational History  . Not on file  Tobacco Use  . Smoking status: Former Smoker    Packs/day: 1.00    Years: 15.00    Pack years: 15.00    Types: Cigarettes    Last attempt to quit: 05/13/1950    Years since quitting: 66.9  . Smokeless tobacco: Never Used  Substance and Sexual Activity  . Alcohol use: No    Alcohol/week: 0.0 oz  . Drug use: No  . Sexual activity: No     Shawn Balding, MD   Note - This record has been created using [employer].  Chart creation errors have been sought, but may not always  have been located. Such creation errors do not reflect on  the standard of medical care.

## 2017-04-24 ENCOUNTER — Ambulatory Visit (INDEPENDENT_AMBULATORY_CARE_PROVIDER_SITE_OTHER): Payer: Medicare Other | Admitting: Internal Medicine

## 2017-04-24 ENCOUNTER — Encounter (INDEPENDENT_AMBULATORY_CARE_PROVIDER_SITE_OTHER): Payer: Self-pay | Admitting: Internal Medicine

## 2017-04-24 VITALS — BP 100/70 | HR 64 | Temp 97.1°F | Resp 18 | Ht 67.0 in | Wt 150.4 lb

## 2017-04-24 DIAGNOSIS — R151 Fecal smearing: Secondary | ICD-10-CM | POA: Diagnosis not present

## 2017-04-24 NOTE — Progress Notes (Signed)
   Subjective:    Patient ID: Shawn Frye, male    DOB: [birth year], 83 y.o.   MRN: [id number]  HPI Here today for f/u. Last seen in June of this year for constipation. Had had constipation for several months. States he does not have signal to have a BM . He was guaiac negative in office.     States has been doing good.  He states he thinks he may be constipated. He is having a BM x 1 a day and sometimes 2 a day.  When the snow came along, he didn't eat regular and had a small BM this morning. He denies any melena or BRRB. He denies any abdominal pain.  Continues to take Fiber (Metamucil) Appetite has remained  good. No weight loss.    Review of Systems Past Medical History:  Diagnosis Date  . AAA (abdominal aortic aneurysm) ([place])   . Arthritis   . GERD (gastroesophageal reflux disease)   . Heart block AV second degree    St. Jude pacemaker May 2017 - Dr. [name]  . Infection of bladder   . Joint pain   . Symptomatic bradycardia     Past Surgical History:  Procedure Laterality Date  . BACK SURGERY    . CATARACT EXTRACTION W/ INTRAOCULAR LENS  IMPLANT, BILATERAL Bilateral   . EP IMPLANTABLE DEVICE N/A 10/02/2015   Procedure: Pacemaker Implant;  Surgeon: Evans Lance, MD;  Location: [facility];  Service: Cardiovascular;  Laterality: N/A;  . EXCISIONAL HEMORRHOIDECTOMY    . HEMORROIDECTOMY    . INGUINAL HERNIA REPAIR Bilateral 1991  . INSERT / REPLACE / REMOVE PACEMAKER    . JOINT REPLACEMENT    . [place] SURGERY  2005  . TOTAL KNEE ARTHROPLASTY Bilateral 1998 -  2003  . TOTAL SHOULDER REPLACEMENT Left 1999   Partial  . TYMPANOSTOMY TUBE PLACEMENT Right     Allergies  Allergen Reactions  . Percocet [Oxycodone-Acetaminophen] Hives  . Noroxin [Norfloxacin] Nausea And Vomiting  . Celebrex [Celecoxib] Rash    Current Outpatient Medications on File Prior to Visit  Medication Sig Dispense Refill  . naproxen sodium (ANAPROX) 220 MG tablet Take 220 mg by mouth as  needed.    Marland Kitchen omeprazole (PRILOSEC OTC) 20 MG tablet Take 20 mg by mouth daily.     . psyllium (METAMUCIL SMOOTH TEXTURE) 28 % packet Take 1 packet by mouth 2 (two) times daily.     No current facility-administered medications on file prior to visit.         Objective:   Physical Exam Blood pressure 100/70, pulse 64, temperature (!) 97.1 F (36.2 C), temperature source Oral, resp. rate 18, height 5\' 7"  (1.702 m), weight 150 lb 6.4 oz (68.2 kg). Alert and oriented. Skin warm and dry. Oral mucosa is moist.   . Sclera anicteric, conjunctivae is pink. Thyroid not enlarged. No cervical lymphadenopathy. Lungs clear. Heart regular rate and rhythm.  Abdomen is soft. Bowel sounds are positive. No hepatomegaly. No abdominal masses felt. No tenderness.  No edema to lower extremities.            Assessment & Plan:  Fecal incontinence: No recent episodes. He is doing much better.  Increase fluids in your diet.

## 2017-04-24 NOTE — Patient Instructions (Signed)
Continue the Fiber. Drink plenty of fluids.

## 2017-05-01 ENCOUNTER — Ambulatory Visit (INDEPENDENT_AMBULATORY_CARE_PROVIDER_SITE_OTHER): Payer: Medicare Other | Admitting: *Deleted

## 2017-05-01 DIAGNOSIS — I441 Atrioventricular block, second degree: Secondary | ICD-10-CM | POA: Diagnosis not present

## 2017-05-01 NOTE — Progress Notes (Signed)
Remote pacemaker transmission.   

## 2017-05-02 ENCOUNTER — Encounter: Payer: Self-pay | Admitting: Cardiology

## 2017-05-02 NOTE — Progress Notes (Signed)
Letter  

## 2017-05-14 LAB — CUP PACEART REMOTE DEVICE CHECK
Battery Remaining Longevity: 102 mo
Brady Statistic AP VS Percent: 1 %
Brady Statistic AS VP Percent: 95 %
Implantable Lead Implant Date: 20170522
Implantable Lead Location: 753859
Lead Channel Impedance Value: 380 Ohm
Lead Channel Pacing Threshold Amplitude: 0.75 V
Lead Channel Pacing Threshold Pulse Width: 0.5 ms
Lead Channel Pacing Threshold Pulse Width: 0.5 ms
Lead Channel Sensing Intrinsic Amplitude: 5.1 mV
Lead Channel Setting Pacing Amplitude: 2 V
Lead Channel Setting Pacing Pulse Width: 0.5 ms
MDC IDC LEAD IMPLANT DT: 20170522
MDC IDC LEAD LOCATION: 753860
MDC IDC MSMT BATTERY REMAINING PERCENTAGE: 95.5 %
MDC IDC MSMT BATTERY VOLTAGE: 2.99 V
MDC IDC MSMT LEADCHNL RA PACING THRESHOLD AMPLITUDE: 0.75 V
MDC IDC MSMT LEADCHNL RA SENSING INTR AMPL: 0.9 mV
MDC IDC MSMT LEADCHNL RV IMPEDANCE VALUE: 440 Ohm
MDC IDC PG IMPLANT DT: 20170522
MDC IDC PG SERIAL: 7899864
MDC IDC SESS DTM: 20181220090015
MDC IDC SET LEADCHNL RV PACING AMPLITUDE: 2.5 V
MDC IDC SET LEADCHNL RV SENSING SENSITIVITY: 2 mV
MDC IDC STAT BRADY AP VP PERCENT: 4.6 %
MDC IDC STAT BRADY AS VS PERCENT: 1 %
MDC IDC STAT BRADY RA PERCENT PACED: 4.4 %
MDC IDC STAT BRADY RV PERCENT PACED: 99 %

## 2017-05-22 DIAGNOSIS — N401 Enlarged prostate with lower urinary tract symptoms: Secondary | ICD-10-CM | POA: Diagnosis not present

## 2017-05-22 DIAGNOSIS — D509 Iron deficiency anemia, unspecified: Secondary | ICD-10-CM | POA: Diagnosis not present

## 2017-05-26 DIAGNOSIS — R21 Rash and other nonspecific skin eruption: Secondary | ICD-10-CM | POA: Diagnosis not present

## 2017-05-26 DIAGNOSIS — I714 Abdominal aortic aneurysm, without rupture: Secondary | ICD-10-CM | POA: Diagnosis not present

## 2017-05-26 DIAGNOSIS — Z6823 Body mass index (BMI) 23.0-23.9, adult: Secondary | ICD-10-CM | POA: Diagnosis not present

## 2017-05-26 DIAGNOSIS — I499 Cardiac arrhythmia, unspecified: Secondary | ICD-10-CM | POA: Diagnosis not present

## 2017-05-26 DIAGNOSIS — D509 Iron deficiency anemia, unspecified: Secondary | ICD-10-CM | POA: Diagnosis not present

## 2017-05-26 DIAGNOSIS — M19011 Primary osteoarthritis, right shoulder: Secondary | ICD-10-CM | POA: Diagnosis not present

## 2017-05-26 DIAGNOSIS — R001 Bradycardia, unspecified: Secondary | ICD-10-CM | POA: Diagnosis not present

## 2017-05-26 DIAGNOSIS — N401 Enlarged prostate with lower urinary tract symptoms: Secondary | ICD-10-CM | POA: Diagnosis not present

## 2017-05-26 DIAGNOSIS — K219 Gastro-esophageal reflux disease without esophagitis: Secondary | ICD-10-CM | POA: Diagnosis not present

## 2017-07-01 ENCOUNTER — Encounter: Payer: Self-pay | Admitting: Family

## 2017-07-01 ENCOUNTER — Ambulatory Visit (HOSPITAL_COMMUNITY)
Admission: RE | Admit: 2017-07-01 | Discharge: 2017-07-01 | Disposition: A | Discharge status: Home / Self Care | Payer: Medicare Other | Source: Ambulatory Visit | Attending: Family | Admitting: Family

## 2017-07-01 ENCOUNTER — Ambulatory Visit (INDEPENDENT_AMBULATORY_CARE_PROVIDER_SITE_OTHER): Payer: Medicare Other | Admitting: Family

## 2017-07-01 VITALS — BP 113/68 | HR 84 | Resp 20 | Ht 67.0 in | Wt 151.3 lb

## 2017-07-01 DIAGNOSIS — I714 Abdominal aortic aneurysm, without rupture, unspecified: Secondary | ICD-10-CM

## 2017-07-01 DIAGNOSIS — I77811 Abdominal aortic ectasia: Secondary | ICD-10-CM | POA: Insufficient documentation

## 2017-07-01 DIAGNOSIS — Z87891 Personal history of nicotine dependence: Secondary | ICD-10-CM | POA: Insufficient documentation

## 2017-07-01 NOTE — Progress Notes (Signed)
[facility]   CC: Follow up Abdominal Aortic Aneurysm  History of Present Illness  Shawn Frye is a 84 y.o. (1918-08-04) male whom Dr. Donnetta Hutching has been monitoring with known AAA. He returns today for routine surveillance.  He has a mild back ache in the mornings which resolves with activity, he denies new back pain, denies abdominal pain.   Dr. Donnetta Hutching last evaluated pt on 06-25-16. At that time abdominal duplex that day showed maximal diameter 5.6 cm compared to maximal diameter of 5.7 cm in July 2017. Dr. Donnetta Hutching discussed with patient and his wife that fortunately no evidence of growth associated with his aneurysm found. Did explain the approximate 5% per year risk of rupture. With his advanced age of 8 I would certainly try to be conservative if possible. Will see him again in 6 months with continued ultrasound surveillance. Again discussed symptoms of leaking aneurysm he knows to present immediately to the emergency room should this occur.  He denies any cardiac problems.  He denies any claudication symptoms, denies non-healing wounds.  He has 3+ palpable bilateral popliteal pulses. January 2016 popliteal arteries duplex showed no evidence of popliteal aneurysms nor stenosis.  He denies any history of stroke or TIA symptoms.  Most of his surgeries were related to OA issues.  He states he has had minor back surgery many years ago.   States he is active physically, does not drink ETOH.   He denies feeling short of breath, denies cough.   He had a pacemaker inserted in May 2017 by Dr. [name] for second degree heart block; states he feels better since then.  Pt Diabetic: No Pt smoker: non-smoker  Pt meds include:  Statin :No, states his cholesterol is good ASA: No Other anticoagulants/antiplatelets: no   Past Medical History:  Diagnosis Date  . AAA (abdominal aortic aneurysm) ([place])   . Arthritis   . GERD (gastroesophageal reflux  disease)   . Heart block AV second degree    St. Jude pacemaker May 2017 - Dr. [name]  . Infection of bladder   . Joint pain   . Symptomatic bradycardia    Past Surgical History:  Procedure Laterality Date  . BACK SURGERY    . CATARACT EXTRACTION W/ INTRAOCULAR LENS  IMPLANT, BILATERAL Bilateral   . EP IMPLANTABLE DEVICE N/A 10/02/2015   Procedure: Pacemaker Implant;  Surgeon: Evans Lance, MD;  Location: [facility];  Service: Cardiovascular;  Laterality: N/A;  . EXCISIONAL HEMORRHOIDECTOMY    . HEMORROIDECTOMY    . INGUINAL HERNIA REPAIR Bilateral 1991  . INSERT / REPLACE / REMOVE PACEMAKER    . JOINT REPLACEMENT    . [place] SURGERY  2005  . TOTAL KNEE ARTHROPLASTY Bilateral 1998 -  2003  . TOTAL SHOULDER REPLACEMENT Left 1999   Partial  . TYMPANOSTOMY TUBE PLACEMENT Right    Social History Social History   Socioeconomic History  . Marital status: Married    Spouse name: Not on file  . Number of children: Not on file  . Years of education: Not on file  . Highest education level: Not on file  Social Needs  . Financial resource strain: Not on file  . Food insecurity - worry: Not on file  . Food insecurity - inability: Not on file  . Transportation needs - medical: Not on file  . Transportation needs - non-medical: Not on file  Occupational History  . Not on file  Tobacco Use  .  Smoking status: Former Smoker    Packs/day: 1.00    Years: 15.00    Pack years: 15.00    Types: Cigarettes    Last attempt to quit: 05/13/1950    Years since quitting: 67.1  . Smokeless tobacco: Never Used  Substance and Sexual Activity  . Alcohol use: No    Alcohol/week: 0.0 oz  . Drug use: No  . Sexual activity: No  Other Topics Concern  . Not on file  Social History Narrative  . Not on file   Family History Family History  Problem Relation Age of Onset  . Heart disease Father        NOT  before age 79  . Pneumonia Mother   . Cystic fibrosis Paternal Aunt   .  Kidney disease Neg Hx     Current Outpatient Medications on File Prior to Visit  Medication Sig Dispense Refill  . naproxen sodium (ANAPROX) 220 MG tablet Take 220 mg by mouth as needed.    Marland Kitchen omeprazole (PRILOSEC OTC) 20 MG tablet Take 20 mg by mouth daily.     . psyllium (METAMUCIL SMOOTH TEXTURE) 28 % packet Take 1 packet by mouth 2 (two) times daily.     No current facility-administered medications on file prior to visit.    Allergies  Allergen Reactions  . Percocet [Oxycodone-Acetaminophen] Hives  . Noroxin [Norfloxacin] Nausea And Vomiting  . Celebrex [Celecoxib] Rash    ROS: See HPI for pertinent positives and negatives.  Physical Examination  Vitals:   07/01/17 1002  BP: 113/68  Pulse: 84  Resp: 20  SpO2: 95%  Weight: 151 lb 4.8 oz (68.6 kg)  Height: 5\' 7"  (1.702 m)   Body mass index is 23.7 kg/m.  General: WDWN elderly male in NAD  Gait: Normal  HENT: WNL  Eyes: Pupils equal  Pulmonary: normal non-labored breathing, without rhonchi or wheezing, + rales left posterior fields (lower half).  Cardiac: RRR with occasional missed contractions, no appreciable murmurs. Pacemaker subcutaneous at left upper chest. Abdomen: soft, NT, no palpable masses  Skin: no rashes, no ulcers, no cellulitis.  VASCULAR EXAM Carotid Bruits Left Right   Negative  Negative    Abdomina aortic pulse is strongly palpable  Radial pulses are 2+ and =   VASCULAR EXAM: Extremitieswithout ischemic changes  without Gangrene; without open wounds.   LE Pulses  LEFT  RIGHT   FEMORAL  2+palpable 2+palpable   POPLITEAL  2+palpable  2+palpable  POSTERIOR TIBIAL  2+palpable  Not palpable   DORSALIS PEDIS ANTERIOR TIBIAL  notpalpable  2+palpable    Musculoskeletal: age appropriatemuscle wasting or atrophy; trace peripheral edema. Significant cervical and thoracic kyphosis.   Neurologic:A&O X 3;  appropriate affect,MOTOR FUNCTION: 5/5 Symmetric, CN 2-12 intact except hard of hearing, wearing bilateral hearing aids. Speech is fluent/normal Psychiatric: Normal thought content, mood appropriate to clinical situation.    DATA  AAA Duplex (07/01/2017):  Previous size: 5.5 cm (Date: 12-24-16); Right CIA: 1.31 cm; Left CIA: 1.9 cm   Current size:  5.6 cm (Date: 07-01-17); Right CIA: 1.2 cm; Left CIA: 1.9 cm. Limited visualization due to overlying bowel gas.   January 2016 popliteal arteries duplex showed no evidence of popliteal aneurysms nor stenosis.     Medical Decision Making  The patient is a 84 y.o. male who presents with asymptomatic AAA with no significant increase in sizeincrease in size, at 5.6 cm.   Based on this patient's exam and diagnostic studies, the patient will follow up  in 6 months with the following studies: AAA duplex..  Consideration for repair of AAA would be made for a normal risk patient when the size is 5.0-5.5 cm, growth > 1 cm/yr, and symptomatic status. Pt is at high surgical risk for repair due to his age.         The patient was given information about AAA including signs, symptoms, treatment,and how to minimize the risk of enlargement and rupture of aneurysms.    I emphasized the importance of maximal medical management including strict control of blood pressure, blood glucose, and lipid levels, antiplatelet agents, obtaining regular exercise, and continued cessation of smoking.   The patient was advised to call 911 should the patient experience sudden onset abdominal or back pain.   Thank you for allowing [place] to participate in this patient's care.  Clemon Chambers, RN, MSN, FNP-C [facility] Office: [phone]  Clinic Physician: Early  07/01/2017, 10:16 AM

## 2017-07-01 NOTE — Patient Instructions (Addendum)
Before your next abdominal ultrasound:  Take two Extra-Strength Gas-X capsules at bedtime the night before the test. Take another two Extra-Strength Gas-X capsules 3 hours before the test.  Avoid gas forming foods the day before the test.       Abdominal Aortic Aneurysm Blood pumps away from the heart through tubes (blood vessels) called arteries. Aneurysms are weak or damaged places in the wall of an artery. It bulges out like a balloon. An abdominal aortic aneurysm happens in the main artery of the body (aorta). It can burst or tear, causing bleeding inside the body. This is an emergency. It needs treatment right away. What are the causes? The exact cause is unknown. Things that could cause this problem include:  Fat and other substances building up in the lining of a tube.  Swelling of the walls of a blood vessel.  Certain tissue diseases.  Belly (abdominal) trauma.  An infection in the main artery of the body.  What increases the risk? There are things that make it more likely for you to have an aneurysm. These include:  Being over the age of 82 years old.  Having high blood pressure (hypertension).  Being a male.  Being white.  Being very overweight (obese).  Having a family history of aneurysm.  Using tobacco products.  What are the signs or symptoms? Symptoms depend on the size of the aneurysm and how fast it grows. There may not be symptoms. If symptoms occur, they can include:  Pain (belly, side, lower back, or groin).  Feeling full after eating a small amount of food.  Feeling sick to your stomach (nauseous), throwing up (vomiting), or both.  Feeling a lump in your belly that feels like it is beating (pulsating).  Feeling like you will pass out (faint).  How is this treated?  Medicine to control blood pressure and pain.  Imaging tests to see if the aneurysm gets bigger.  Surgery. How is this prevented? To lessen your chance of getting this  condition:  Stop smoking. Stop chewing tobacco.  Limit or avoid alcohol.  Keep your blood pressure, blood sugar, and cholesterol within normal limits.  Eat less salt.  Eat foods low in saturated fats and cholesterol. These are found in animal and whole dairy products.  Eat more fiber. Fiber is found in whole grains, vegetables, and fruits.  Keep a healthy weight.  Stay active and exercise often.  This information is not intended to replace advice given to you by your health care provider. Make sure you discuss any questions you have with your health care provider. Document Released: 08/24/2012 Document Revised: 10/05/2015 Document Reviewed: 05/29/2012 Elsevier Interactive Patient Education  2017 Elsevier Inc.  

## 2017-07-02 NOTE — Addendum Note (Signed)
Addended by: Lianne Cure A on: 07/02/2017 12:28 PM   Modules accepted: Orders

## 2017-07-04 DIAGNOSIS — J209 Acute bronchitis, unspecified: Secondary | ICD-10-CM | POA: Diagnosis not present

## 2017-07-04 DIAGNOSIS — J029 Acute pharyngitis, unspecified: Secondary | ICD-10-CM | POA: Diagnosis not present

## 2017-07-04 DIAGNOSIS — J111 Influenza due to unidentified influenza virus with other respiratory manifestations: Secondary | ICD-10-CM | POA: Diagnosis not present

## 2017-07-09 ENCOUNTER — Ambulatory Visit (INDEPENDENT_AMBULATORY_CARE_PROVIDER_SITE_OTHER): Payer: Medicare Other | Admitting: Orthopaedic Surgery

## 2017-07-09 ENCOUNTER — Encounter (INDEPENDENT_AMBULATORY_CARE_PROVIDER_SITE_OTHER): Payer: Self-pay | Admitting: Orthopaedic Surgery

## 2017-07-09 DIAGNOSIS — M25511 Pain in right shoulder: Secondary | ICD-10-CM | POA: Diagnosis not present

## 2017-07-09 DIAGNOSIS — G8929 Other chronic pain: Secondary | ICD-10-CM | POA: Diagnosis not present

## 2017-07-09 MED ORDER — METHYLPREDNISOLONE ACETATE 40 MG/ML IJ SUSP
80.0000 mg | INTRAMUSCULAR | Status: AC | PRN
  Administered 2017-07-09: 80 mg

## 2017-07-09 MED ORDER — BUPIVACAINE HCL 0.5 % IJ SOLN
2.0000 mL | INTRAMUSCULAR | Status: AC | PRN
  Administered 2017-07-09: 2 mL via INTRA_ARTICULAR

## 2017-07-09 MED ORDER — LIDOCAINE HCL 1 % IJ SOLN
2.0000 mL | INTRAMUSCULAR | Status: AC | PRN
  Administered 2017-07-09: 2 mL

## 2017-07-09 NOTE — Progress Notes (Signed)
Office Visit Note   Patient: Shawn Frye           Date of Birth: [birth year]           MRN: [id number] Visit Date: 07/09/2017              Requested by: [name], MD [place],  [id number] PCP: [name], MD   Assessment & Plan: Visit Diagnoses:  1. Chronic right shoulder pain     Plan: Shawn Frye is seen on occasion for injection of the right shoulder. He has osteoarthritis. On each occasion injections make a difference. He's not interested in definitive shoulder replacement surgery. I will reinject the shoulder today with cortisone  Follow-Up Instructions: Return if symptoms worsen or fail to improve.   Orders:  No orders of the defined types were placed in this encounter.  No orders of the defined types were placed in this encounter.     Procedures: Large Joint Inj: R glenohumeral on 07/09/2017 4:03 PM Indications: pain and diagnostic evaluation Details: 25 G 1.5 in needle, posterior approach  Arthrogram: No  Medications: 2 mL lidocaine 1 %; 2 mL bupivacaine 0.5 %; 80 mg methylPREDNISolone acetate 40 MG/ML Consent was given by the patient. Immediately prior to procedure a time out was called to verify the correct patient, procedure, equipment, support staff and site/side marked as required. Patient was prepped and draped in the usual sterile fashion.       Clinical Data: No additional findings.   Subjective: No chief complaint on file. Shawn Frye has been previously diagnosed with osteoarthritis of the right shoulder. He's done well with prior cortisone injections. His last injection was approximately 3 months ago. He's had recurrent pain. No history of injury or trauma. No numbness or tingling  HPI  Review of Systems   Objective: Vital Signs: There were no vitals taken for this visit.  Physical Exam  Ortho Exam awake alert and oriented 3. Comfortable sitting. Limited overhead range of motion based on his arthritis. Does have  some grating and crepitation. Atrophy about the shoulder girdle. No redness. No evidence of infection.  Specialty Comments:  No specialty comments available.  Imaging: No results found.   PMFS History: Patient Active Problem List   Diagnosis Date Noted  . Primary osteoarthritis, right shoulder 01/08/2017  . Mobitz type 2 second degree atrioventricular block 10/02/2015  . Bradycardia 08/29/2015  . Abdominal aneurysm without mention of rupture 03/21/2011   Past Medical History:  Diagnosis Date  . AAA (abdominal aortic aneurysm) ([place])   . Arthritis   . GERD (gastroesophageal reflux disease)   . Heart block AV second degree    St. Jude pacemaker May 2017 - Dr. [name]  . Infection of bladder   . Joint pain   . Symptomatic bradycardia     Family History  Problem Relation Age of Onset  . Heart disease Father        NOT  before age 47  . Pneumonia Mother   . Cystic fibrosis Paternal Aunt   . Kidney disease Neg Hx     Past Surgical History:  Procedure Laterality Date  . BACK SURGERY    . CATARACT EXTRACTION W/ INTRAOCULAR LENS  IMPLANT, BILATERAL Bilateral   . EP IMPLANTABLE DEVICE N/A 10/02/2015   Procedure: Pacemaker Implant;  Surgeon: Evans Lance, MD;  Location: [facility];  Service: Cardiovascular;  Laterality: N/A;  . EXCISIONAL HEMORRHOIDECTOMY    .  HEMORROIDECTOMY    . INGUINAL HERNIA REPAIR Bilateral 1991  . INSERT / REPLACE / REMOVE PACEMAKER    . JOINT REPLACEMENT    . [place] SURGERY  2005  . TOTAL KNEE ARTHROPLASTY Bilateral 1998 -  2003  . TOTAL SHOULDER REPLACEMENT Left 1999   Partial  . TYMPANOSTOMY TUBE PLACEMENT Right    Social History   Occupational History  . Not on file  Tobacco Use  . Smoking status: Former Smoker    Packs/day: 1.00    Years: 15.00    Pack years: 15.00    Types: Cigarettes    Last attempt to quit: 05/13/1950    Years since quitting: 67.2  . Smokeless tobacco: Never Used  Substance and Sexual Activity  .  Alcohol use: No    Alcohol/week: 0.0 oz  . Drug use: No  . Sexual activity: No     Garald Balding, MD   Note - This record has been created using [employer].  Chart creation errors have been sought, but may not always  have been located. Such creation errors do not reflect on  the standard of medical care.

## 2017-07-31 ENCOUNTER — Ambulatory Visit (INDEPENDENT_AMBULATORY_CARE_PROVIDER_SITE_OTHER): Payer: Medicare Other | Admitting: *Deleted

## 2017-07-31 DIAGNOSIS — I441 Atrioventricular block, second degree: Secondary | ICD-10-CM

## 2017-07-31 NOTE — Progress Notes (Signed)
Remote pacemaker transmission.   

## 2017-08-01 ENCOUNTER — Encounter: Payer: Self-pay | Admitting: Cardiology

## 2017-08-04 LAB — CUP PACEART REMOTE DEVICE CHECK
Battery Remaining Longevity: 102 mo
Brady Statistic AP VP Percent: 4.1 %
Brady Statistic AS VP Percent: 96 %
Brady Statistic RA Percent Paced: 3.8 %
Brady Statistic RV Percent Paced: 99 %
Date Time Interrogation Session: 20190321060016
Implantable Lead Implant Date: 20170522
Implantable Lead Implant Date: 20170522
Implantable Lead Location: 753859
Implantable Lead Location: 753860
Lead Channel Impedance Value: 450 Ohm
Lead Channel Pacing Threshold Amplitude: 0.75 V
Lead Channel Pacing Threshold Pulse Width: 0.5 ms
Lead Channel Sensing Intrinsic Amplitude: 6.4 mV
Lead Channel Setting Pacing Amplitude: 2 V
Lead Channel Setting Pacing Pulse Width: 0.5 ms
Lead Channel Setting Sensing Sensitivity: 2 mV
MDC IDC MSMT BATTERY REMAINING PERCENTAGE: 95.5 %
MDC IDC MSMT BATTERY VOLTAGE: 2.99 V
MDC IDC MSMT LEADCHNL RA IMPEDANCE VALUE: 390 Ohm
MDC IDC MSMT LEADCHNL RA PACING THRESHOLD AMPLITUDE: 0.75 V
MDC IDC MSMT LEADCHNL RA SENSING INTR AMPL: 1.1 mV
MDC IDC MSMT LEADCHNL RV PACING THRESHOLD PULSEWIDTH: 0.5 ms
MDC IDC PG IMPLANT DT: 20170522
MDC IDC SET LEADCHNL RV PACING AMPLITUDE: 2.5 V
MDC IDC STAT BRADY AP VS PERCENT: 1 %
MDC IDC STAT BRADY AS VS PERCENT: 1 %
Pulse Gen Model: 2272
Pulse Gen Serial Number: 7899864

## 2017-08-25 ENCOUNTER — Ambulatory Visit (INDEPENDENT_AMBULATORY_CARE_PROVIDER_SITE_OTHER): Payer: Medicare Other | Admitting: Otolaryngology

## 2017-08-25 DIAGNOSIS — H9011 Conductive hearing loss, unilateral, right ear, with unrestricted hearing on the contralateral side: Secondary | ICD-10-CM

## 2017-08-25 DIAGNOSIS — H6983 Other specified disorders of Eustachian tube, bilateral: Secondary | ICD-10-CM

## 2017-08-25 DIAGNOSIS — H6521 Chronic serous otitis media, right ear: Secondary | ICD-10-CM

## 2017-08-25 DIAGNOSIS — H6981 Other specified disorders of Eustachian tube, right ear: Secondary | ICD-10-CM | POA: Diagnosis not present

## 2017-08-26 ENCOUNTER — Telehealth: Payer: Self-pay

## 2017-08-26 DIAGNOSIS — D509 Iron deficiency anemia, unspecified: Secondary | ICD-10-CM | POA: Diagnosis not present

## 2017-08-26 DIAGNOSIS — R21 Rash and other nonspecific skin eruption: Secondary | ICD-10-CM | POA: Diagnosis not present

## 2017-08-26 DIAGNOSIS — I714 Abdominal aortic aneurysm, without rupture: Secondary | ICD-10-CM | POA: Diagnosis not present

## 2017-08-26 DIAGNOSIS — I499 Cardiac arrhythmia, unspecified: Secondary | ICD-10-CM | POA: Diagnosis not present

## 2017-08-26 DIAGNOSIS — R0602 Shortness of breath: Secondary | ICD-10-CM | POA: Diagnosis not present

## 2017-08-26 DIAGNOSIS — Z6824 Body mass index (BMI) 24.0-24.9, adult: Secondary | ICD-10-CM | POA: Diagnosis not present

## 2017-08-26 DIAGNOSIS — Z6823 Body mass index (BMI) 23.0-23.9, adult: Secondary | ICD-10-CM | POA: Diagnosis not present

## 2017-08-26 DIAGNOSIS — I509 Heart failure, unspecified: Secondary | ICD-10-CM | POA: Diagnosis not present

## 2017-08-26 DIAGNOSIS — M8940 Other hypertrophic osteoarthropathy, unspecified site: Secondary | ICD-10-CM | POA: Diagnosis not present

## 2017-08-26 DIAGNOSIS — K219 Gastro-esophageal reflux disease without esophagitis: Secondary | ICD-10-CM | POA: Diagnosis not present

## 2017-08-26 DIAGNOSIS — N400 Enlarged prostate without lower urinary tract symptoms: Secondary | ICD-10-CM | POA: Diagnosis not present

## 2017-08-26 DIAGNOSIS — M19011 Primary osteoarthritis, right shoulder: Secondary | ICD-10-CM | POA: Diagnosis not present

## 2017-08-26 DIAGNOSIS — R001 Bradycardia, unspecified: Secondary | ICD-10-CM | POA: Diagnosis not present

## 2017-08-26 DIAGNOSIS — R5383 Other fatigue: Secondary | ICD-10-CM | POA: Diagnosis not present

## 2017-08-26 DIAGNOSIS — N401 Enlarged prostate with lower urinary tract symptoms: Secondary | ICD-10-CM | POA: Diagnosis not present

## 2017-08-26 NOTE — Telephone Encounter (Signed)
Patient walked in office today @ 3:35. He states that for the past few days he has felt short winded. He did not complain of any chest pain, swelling or dizziness. He stated that if felt like he needed to cough something up that wouldn't come up. He stated that he did have a cold a few weeks ago and that he has not fully recovered from it. We do not have any available appointment today, and I advised pt the next available appointment will be 4/30. He stated he could not wait that long. I advised him to follow up with his pcp. He stated he would go over there when he left our office. I let him know if he felt worse then he would need to be evaluated in the ED. He thanked me for my help and I advised him I would send note to device clinic as he wanted to make sure it is working properly.

## 2017-08-26 NOTE — Telephone Encounter (Signed)
LVM for call back

## 2017-08-27 NOTE — Telephone Encounter (Signed)
Mr. Hamm says that he feels better today but will see his PCP tomorrow. His last PPM remote transmission was 07/31/17, AT noted. Monitor updating properly. He is aware and appreciative.

## 2017-08-28 DIAGNOSIS — I5033 Acute on chronic diastolic (congestive) heart failure: Secondary | ICD-10-CM | POA: Diagnosis not present

## 2017-08-28 DIAGNOSIS — N400 Enlarged prostate without lower urinary tract symptoms: Secondary | ICD-10-CM | POA: Diagnosis not present

## 2017-08-28 DIAGNOSIS — R5383 Other fatigue: Secondary | ICD-10-CM | POA: Diagnosis not present

## 2017-08-28 DIAGNOSIS — M8940 Other hypertrophic osteoarthropathy, unspecified site: Secondary | ICD-10-CM | POA: Diagnosis not present

## 2017-08-28 DIAGNOSIS — I499 Cardiac arrhythmia, unspecified: Secondary | ICD-10-CM | POA: Diagnosis not present

## 2017-08-28 DIAGNOSIS — R001 Bradycardia, unspecified: Secondary | ICD-10-CM | POA: Diagnosis not present

## 2017-08-28 DIAGNOSIS — I714 Abdominal aortic aneurysm, without rupture: Secondary | ICD-10-CM | POA: Diagnosis not present

## 2017-08-28 DIAGNOSIS — D509 Iron deficiency anemia, unspecified: Secondary | ICD-10-CM | POA: Diagnosis not present

## 2017-08-28 DIAGNOSIS — M19011 Primary osteoarthritis, right shoulder: Secondary | ICD-10-CM | POA: Diagnosis not present

## 2017-08-28 DIAGNOSIS — N401 Enlarged prostate with lower urinary tract symptoms: Secondary | ICD-10-CM | POA: Diagnosis not present

## 2017-08-28 DIAGNOSIS — K219 Gastro-esophageal reflux disease without esophagitis: Secondary | ICD-10-CM | POA: Diagnosis not present

## 2017-08-28 DIAGNOSIS — R0602 Shortness of breath: Secondary | ICD-10-CM | POA: Diagnosis not present

## 2017-09-01 DIAGNOSIS — J449 Chronic obstructive pulmonary disease, unspecified: Secondary | ICD-10-CM | POA: Diagnosis not present

## 2017-09-01 DIAGNOSIS — R0602 Shortness of breath: Secondary | ICD-10-CM | POA: Diagnosis not present

## 2017-09-22 ENCOUNTER — Ambulatory Visit (INDEPENDENT_AMBULATORY_CARE_PROVIDER_SITE_OTHER): Payer: Medicare Other | Admitting: Otolaryngology

## 2017-09-22 DIAGNOSIS — R1312 Dysphagia, oropharyngeal phase: Secondary | ICD-10-CM | POA: Diagnosis not present

## 2017-09-22 DIAGNOSIS — H9071 Mixed conductive and sensorineural hearing loss, unilateral, right ear, with unrestricted hearing on the contralateral side: Secondary | ICD-10-CM

## 2017-09-22 DIAGNOSIS — H7201 Central perforation of tympanic membrane, right ear: Secondary | ICD-10-CM | POA: Diagnosis not present

## 2017-09-22 DIAGNOSIS — H903 Sensorineural hearing loss, bilateral: Secondary | ICD-10-CM

## 2017-09-22 DIAGNOSIS — H6903 Patulous Eustachian tube, bilateral: Secondary | ICD-10-CM

## 2017-10-07 NOTE — Progress Notes (Signed)
Cardiology Office Note  Date: 10/10/2017   ID: Shawn Frye, DOB Feb 25, 1919, MRN [id number]  PCP: [name], MD  Primary Cardiologist: Rozann Lesches, MD   Chief Complaint  Patient presents with  . Cardiac follow-up    History of Present Illness: Shawn Frye is a 84 y.o. male last seen in June 2018.  He is here today with his wife.  He tells me that he was seen by his PCP recently and told that he had "fluid around his heart".  He had been experiencing intermittent shortness of breath, no chest pain or palpitations.  He was placed on low-dose Lasix.  He is not entirely sure whether it has made much difference.  His weight is down may be a pound by our scales.  He continues to follow with Dr. [name] in the device clinic, [place] pacemaker in place.  I personally reviewed his ECG today which shows a ventricular paced rhythm with atrial tracking.  Visit with [facility] noted in February of this year.  Abdominal duplex imaging showed AAA of 5.6 cm.  Last echocardiogram in April 2017 revealed LVEF 50 to 55% with grade 1 diastolic dysfunction.  Past Medical History:  Diagnosis Date  . AAA (abdominal aortic aneurysm) ([place])   . Arthritis   . GERD (gastroesophageal reflux disease)   . Heart block AV second degree    St. Jude pacemaker May 2017 - Dr. [name]  . Infection of bladder   . Joint pain   . Symptomatic bradycardia     Past Surgical History:  Procedure Laterality Date  . BACK SURGERY    . CATARACT EXTRACTION W/ INTRAOCULAR LENS  IMPLANT, BILATERAL Bilateral   . EP IMPLANTABLE DEVICE N/A 10/02/2015   Procedure: Pacemaker Implant;  Surgeon: Evans Lance, MD;  Location: [facility];  Service: Cardiovascular;  Laterality: N/A;  . EXCISIONAL HEMORRHOIDECTOMY    . HEMORROIDECTOMY    . INGUINAL HERNIA REPAIR Bilateral 1991  . INSERT / REPLACE / REMOVE PACEMAKER    . JOINT REPLACEMENT    . [place] SURGERY  2005  . TOTAL KNEE ARTHROPLASTY Bilateral 1998 -  2003  .  TOTAL SHOULDER REPLACEMENT Left 1999   Partial  . TYMPANOSTOMY TUBE PLACEMENT Right     Current Outpatient Medications  Medication Sig Dispense Refill  . furosemide (LASIX) 20 MG tablet Take 20 mg by mouth daily.    . potassium chloride (K-DUR,KLOR-CON) 10 MEQ tablet Take 10 mEq by mouth daily.    . psyllium (METAMUCIL SMOOTH TEXTURE) 28 % packet Take 1 packet by mouth 2 (two) times daily.    . ranitidine (ZANTAC) 150 MG tablet Take 1 tablet by mouth daily.  6   No current facility-administered medications for this visit.    Allergies:  Percocet [oxycodone-acetaminophen]; Noroxin [norfloxacin]; and Celebrex [celecoxib]   Social History: The patient  reports that he quit smoking about 67 years ago. His smoking use included cigarettes. He has a 15.00 pack-year smoking history. He has never used smokeless tobacco. He reports that he does not drink alcohol or use drugs.   ROS:  Please see the history of present illness. Otherwise, complete review of systems is positive for hearing loss.  All other systems are reviewed and negative.   Physical Exam: VS:  BP 120/76   Pulse 87   Ht 5' 7"  (1.702 m)   Wt 149 lb 12.8 oz (67.9 kg)   SpO2 97%   BMI 23.46 kg/m ,  BMI Body mass index is 23.46 kg/m.  Wt Readings from Last 3 Encounters:  10/10/17 149 lb 12.8 oz (67.9 kg)  07/01/17 151 lb 4.8 oz (68.6 kg)  04/24/17 150 lb 6.4 oz (68.2 kg)    General: Elderly male, no distress. HEENT: Conjunctiva and lids normal, oropharynx clear. Neck: Supple, no elevated JVP or carotid bruits, no thyromegaly. Lungs: No crackles, nonlabored breathing at rest. Cardiac: Regular rate and rhythm, no S3, soft systolic murmur. Abdomen: Soft, nontender, bowel sounds present. Extremities: No pitting edema, distal pulses 2+. Skin: Warm and dry. Musculoskeletal: Kyphosis present. Neuropsychiatric: Alert and oriented x3, affect grossly appropriate.  ECG: I personally reviewed the tracing from 11/08/2016 which showed  a ventricular paced rhythm with atrial sensing.  Other Studies Reviewed Today:  Echocardiogram 08/30/2015: Study Conclusions  - Left ventricle: The cavity size was normal. Wall thickness was normal. Systolic function was normal. The estimated ejection fraction was in the range of 50% to 55%. Wall motion was normal; there were no regional wall motion abnormalities. Doppler parameters are consistent with abnormal left ventricular relaxation (grade 1 diastolic dysfunction). - Aortic valve: Mildly calcified annulus. Trileaflet; mildly calcified leaflets. Left coronary cusp mobility was mildly restricted. There was mild regurgitation. - Aortic root: The aortic root was mildly ectatic. - Mitral valve: Calcified annulus. There was trivial regurgitation. - Left atrium: The atrium was mildly dilated. - Right atrium: Central venous pressure (est): 3 mm Hg. - Tricuspid valve: There was mild regurgitation. - Pulmonary arteries: PA peak pressure: 28 mm Hg (S). - Pericardium, extracardiac: There was no pericardial effusion.  Impressions:  - Normal LV wall thickness with LVEF 50-55%. Grade 1 diastolic dysfunction with normal estimated LV filling pressure. Mild left atrial enlargement. MAC with trivial mitral regurgitation. Sclerotic aortic valve without stenosis. Mild aortic regurgitation. Mild tricuspid regurgitation with PASP 28 mmHg.  Assessment and Plan:  1.  Intermittent shortness of breath.  His lungs do not exhibit any crackles today and his weight is relatively stable despite recent use of Lasix.  Last LVEF was 50 to 55% as of 2017, we will obtain a follow-up echocardiogram to exclude interval development of cardiomyopathy particularly since pacing.  2.  Symptomatic bradycardia and second-degree heart block status post St. Jude pacemaker.  He continues to follow with Dr. [name].  Today's tracing shows a ventricular paced rhythm with appropriate atrial  tracking.  3.  Abdominal aortic aneurysm followed by VVS.  He is asymptomatic.  Current medicines were reviewed with the patient today.   Orders Placed This Encounter  Procedures  . EKG 12-Lead  . ECHOCARDIOGRAM COMPLETE    Disposition: Call with test results.  Signed, Satira Sark, MD, [facility] 10/10/2017 2:15 PM    [facility] [address] [place], [place] [address] Phone: [phone]; Fax: [phone]

## 2017-10-10 ENCOUNTER — Encounter: Payer: Self-pay | Admitting: Cardiology

## 2017-10-10 ENCOUNTER — Ambulatory Visit (INDEPENDENT_AMBULATORY_CARE_PROVIDER_SITE_OTHER): Payer: Medicare Other | Admitting: Cardiology

## 2017-10-10 VITALS — BP 120/76 | HR 87 | Ht 67.0 in | Wt 149.8 lb

## 2017-10-10 DIAGNOSIS — I714 Abdominal aortic aneurysm, without rupture, unspecified: Secondary | ICD-10-CM

## 2017-10-10 DIAGNOSIS — R0602 Shortness of breath: Secondary | ICD-10-CM | POA: Diagnosis not present

## 2017-10-10 DIAGNOSIS — Z950 Presence of cardiac pacemaker: Secondary | ICD-10-CM | POA: Diagnosis not present

## 2017-10-10 DIAGNOSIS — I441 Atrioventricular block, second degree: Secondary | ICD-10-CM | POA: Diagnosis not present

## 2017-10-10 NOTE — Patient Instructions (Signed)
Medication Instructions:  Your physician recommends that you continue on your current medications as directed. Please refer to the Current Medication list given to you today.  Labwork: NONE  Testing/Procedures: Your physician has requested that you have an echocardiogram. Echocardiography is a painless test that uses sound waves to create images of your heart. It provides your doctor with information about the size and shape of your heart and how well your heart's chambers and valves are working. This procedure takes approximately one hour. There are no restrictions for this procedure.    Follow-Up: Your physician recommends that you schedule a follow-up appointment in: TO BE DETERMINED, WE WILL CALL WITH TEST RESULTS   Any Other Special Instructions Will Be Listed Below (If Applicable).     If you need a refill on your cardiac medications before your next appointment, please call your pharmacy.   

## 2017-10-17 ENCOUNTER — Ambulatory Visit (HOSPITAL_COMMUNITY)
Admission: RE | Admit: 2017-10-17 | Discharge: 2017-10-17 | Disposition: A | Discharge status: Home / Self Care | Payer: Medicare Other | Source: Ambulatory Visit | Attending: Cardiology | Admitting: Cardiology

## 2017-10-17 ENCOUNTER — Telehealth: Payer: Self-pay

## 2017-10-17 DIAGNOSIS — R0602 Shortness of breath: Secondary | ICD-10-CM | POA: Insufficient documentation

## 2017-10-17 DIAGNOSIS — I441 Atrioventricular block, second degree: Secondary | ICD-10-CM | POA: Insufficient documentation

## 2017-10-17 DIAGNOSIS — Z79899 Other long term (current) drug therapy: Secondary | ICD-10-CM

## 2017-10-17 DIAGNOSIS — R001 Bradycardia, unspecified: Secondary | ICD-10-CM | POA: Insufficient documentation

## 2017-10-17 DIAGNOSIS — I088 Other rheumatic multiple valve diseases: Secondary | ICD-10-CM | POA: Insufficient documentation

## 2017-10-17 NOTE — Telephone Encounter (Signed)
-----   Message from Satira Sark, MD sent at 10/17/2017 11:57 AM EDT ----- Results reviewed.  LVEF has declined significantly since last study in 2017, now 25%.  At age 84 there are certainly a number of possibilities that could be contributing to this, however he does have a pacemaker and his RV pacing 99% of the time, I will ask Dr. [name] if any adjustments in his device are warranted as a potential cause of cardiomyopathy.  Otherwise, please schedule an office follow-up visit in the next few weeks with Ms. Ahmed Prima PA-C so we can initiate basic medical therapy for cardiomyopathy (likely low-dose Coreg and ARB if tolerated).  He needs to have a BMET to follow-up on renal function on low-dose Lasix.  I would not anticipate an aggressive invasive work-up however. A copy of this test should be forwarded to [name], MD.

## 2017-10-17 NOTE — Progress Notes (Signed)
*  PRELIMINARY RESULTS* Echocardiogram 2D Echocardiogram has been performed.  Shawn Frye 10/17/2017, 11:26 AM

## 2017-10-17 NOTE — Telephone Encounter (Signed)
Apt made with B Strader PA-C for 11/07/17 at 330 pm    Lab slip for bmet at front desk     [id number]

## 2017-10-22 ENCOUNTER — Telehealth: Payer: Self-pay | Admitting: Student

## 2017-10-22 NOTE — Telephone Encounter (Signed)
Results of lab work / tg  °

## 2017-10-22 NOTE — Telephone Encounter (Signed)
Patient contacted and is requesting results of echo. Results given to patient.

## 2017-10-23 ENCOUNTER — Ambulatory Visit (INDEPENDENT_AMBULATORY_CARE_PROVIDER_SITE_OTHER): Payer: Medicare Other | Admitting: Internal Medicine

## 2017-10-23 ENCOUNTER — Encounter (INDEPENDENT_AMBULATORY_CARE_PROVIDER_SITE_OTHER): Payer: Self-pay | Admitting: Internal Medicine

## 2017-10-29 ENCOUNTER — Encounter (INDEPENDENT_AMBULATORY_CARE_PROVIDER_SITE_OTHER): Payer: Self-pay | Admitting: *Deleted

## 2017-10-29 ENCOUNTER — Ambulatory Visit (INDEPENDENT_AMBULATORY_CARE_PROVIDER_SITE_OTHER): Payer: Medicare Other | Admitting: Internal Medicine

## 2017-10-29 ENCOUNTER — Encounter (INDEPENDENT_AMBULATORY_CARE_PROVIDER_SITE_OTHER): Payer: Self-pay | Admitting: Internal Medicine

## 2017-10-29 VITALS — BP 110/68 | HR 64 | Temp 98.2°F | Ht 67.0 in | Wt 145.7 lb

## 2017-10-29 DIAGNOSIS — R1310 Dysphagia, unspecified: Secondary | ICD-10-CM

## 2017-10-29 DIAGNOSIS — R1319 Other dysphagia: Secondary | ICD-10-CM

## 2017-10-29 NOTE — Patient Instructions (Signed)
DG esphagus

## 2017-10-29 NOTE — Progress Notes (Signed)
   Subjective:    Patient ID: Shawn Frye, male    DOB: [birth year], 84 y.o.   MRN: [id number]  HPI Here today for f/u. States sometimes his foods feel like foods going down slow. Sometimes he has to drink fluid to make the bolus go down.  Sometimes he has no problems. Sometimes he has trouble swallowing water.  States he had his esophagus stretched years ago. Per records, had stricture at GE junction with small ulcer.  His appetite is good. His weight today is 145.7. Last visit in December was 150.  Has a BM daily and sometimes twice a day. No fecal incontinence. No melena or BRRB.  EGD/ED 2008 DR. 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  Small sliding hiatal hernia.  Gastroduodenitis.  Review of Systems Past Medical History:  Diagnosis Date  . AAA (abdominal aortic aneurysm) ([place])   . Arthritis   . GERD (gastroesophageal reflux disease)   . Heart block AV second degree    St. Jude pacemaker May 2017 - Dr. [name]  . Infection of bladder   . Joint pain   . Symptomatic bradycardia     Past Surgical History:  Procedure Laterality Date  . BACK SURGERY    . CATARACT EXTRACTION W/ INTRAOCULAR LENS  IMPLANT, BILATERAL Bilateral   . EP IMPLANTABLE DEVICE N/A 10/02/2015   Procedure: Pacemaker Implant;  Surgeon: Evans Lance, MD;  Location: [facility];  Service: Cardiovascular;  Laterality: N/A;  . EXCISIONAL HEMORRHOIDECTOMY    . HEMORROIDECTOMY    . INGUINAL HERNIA REPAIR Bilateral 1991  . INSERT / REPLACE / REMOVE PACEMAKER    . JOINT REPLACEMENT    . [place] SURGERY  2005  . TOTAL KNEE ARTHROPLASTY Bilateral 1998 -  2003  . TOTAL SHOULDER REPLACEMENT Left 1999   Partial  . TYMPANOSTOMY TUBE PLACEMENT Right     Allergies  Allergen Reactions  . Percocet [Oxycodone-Acetaminophen] Hives  . Noroxin [Norfloxacin] Nausea And Vomiting  . Celebrex [Celecoxib] Rash    Current Outpatient  Medications on File Prior to Visit  Medication Sig Dispense Refill  . furosemide (LASIX) 20 MG tablet Take 20 mg by mouth daily.    . potassium chloride (K-DUR,KLOR-CON) 10 MEQ tablet Take 10 mEq by mouth daily.    . psyllium (METAMUCIL SMOOTH TEXTURE) 28 % packet Take 1 packet by mouth 2 (two) times daily.    . ranitidine (ZANTAC) 150 MG tablet Take 1 tablet by mouth daily.  6   No current facility-administered medications on file prior to visit.         Objective:   Physical Exam Blood pressure 110/68, pulse 64, temperature 98.2 F (36.8 C), height 5\' 7"  (1.702 m), weight 145 lb 11.2 oz (66.1 kg). Alert and oriented. Skin warm and dry. Oral mucosa is moist.   . Sclera anicteric, conjunctivae is pink. Thyroid not enlarged. No cervical lymphadenopathy. Lungs clear. Heart regular rate and rhythm.  Abdomen is soft. Bowel sounds are positive. No hepatomegaly. No abdominal masses felt. No tenderness.  No edema to lower extremities.           Assessment & Plan:  Dysphagia. Am going to get an Esophagram. Further recommendations to follow.

## 2017-10-30 ENCOUNTER — Ambulatory Visit (INDEPENDENT_AMBULATORY_CARE_PROVIDER_SITE_OTHER): Payer: Medicare Other | Admitting: *Deleted

## 2017-10-30 DIAGNOSIS — I441 Atrioventricular block, second degree: Secondary | ICD-10-CM

## 2017-10-30 DIAGNOSIS — R001 Bradycardia, unspecified: Secondary | ICD-10-CM

## 2017-10-30 NOTE — Progress Notes (Signed)
Remote pacemaker transmission.   

## 2017-11-03 ENCOUNTER — Ambulatory Visit (HOSPITAL_COMMUNITY)
Admission: RE | Admit: 2017-11-03 | Discharge: 2017-11-03 | Disposition: A | Discharge status: Home / Self Care | Payer: Medicare Other | Source: Ambulatory Visit | Attending: Internal Medicine | Admitting: Internal Medicine

## 2017-11-03 DIAGNOSIS — R1310 Dysphagia, unspecified: Secondary | ICD-10-CM | POA: Diagnosis not present

## 2017-11-03 DIAGNOSIS — T17308A Unspecified foreign body in larynx causing other injury, initial encounter: Secondary | ICD-10-CM | POA: Diagnosis not present

## 2017-11-03 DIAGNOSIS — R1319 Other dysphagia: Secondary | ICD-10-CM

## 2017-11-04 ENCOUNTER — Other Ambulatory Visit (HOSPITAL_COMMUNITY)
Admission: RE | Admit: 2017-11-04 | Discharge: 2017-11-04 | Disposition: A | Discharge status: Home / Self Care | Payer: Medicare Other | Source: Ambulatory Visit | Attending: Cardiology | Admitting: Cardiology

## 2017-11-04 DIAGNOSIS — Z79899 Other long term (current) drug therapy: Secondary | ICD-10-CM | POA: Diagnosis not present

## 2017-11-04 LAB — BASIC METABOLIC PANEL
ANION GAP: 9 (ref 5–15)
BUN: 17 mg/dL (ref 8–23)
CO2: 25 mmol/L (ref 22–32)
CREATININE: 1.07 mg/dL (ref 0.61–1.24)
Calcium: 8.9 mg/dL (ref 8.9–10.3)
Chloride: 104 mmol/L (ref 98–111)
GFR, EST NON AFRICAN AMERICAN: 56 mL/min — AB (ref >60)
GLUCOSE: 200 mg/dL — AB (ref 70–99)
Potassium: 3.7 mmol/L (ref 3.5–5.1)
Sodium: 138 mmol/L (ref 135–145)

## 2017-11-05 ENCOUNTER — Telehealth: Payer: Self-pay

## 2017-11-05 ENCOUNTER — Telehealth (INDEPENDENT_AMBULATORY_CARE_PROVIDER_SITE_OTHER): Payer: Self-pay | Admitting: Internal Medicine

## 2017-11-05 DIAGNOSIS — H43811 Vitreous degeneration, right eye: Secondary | ICD-10-CM | POA: Diagnosis not present

## 2017-11-05 NOTE — Telephone Encounter (Signed)
-----   Message from Satira Sark, MD sent at 11/05/2017  9:40 AM EDT ----- Results reviewed.  Potassium and renal function are stable. A copy of this test should be forwarded to [name], MD.

## 2017-11-05 NOTE — Telephone Encounter (Signed)
Called pt. No answer, lmtcb 

## 2017-11-05 NOTE — Telephone Encounter (Signed)
Shawn Frye, needs referral to speech pathology

## 2017-11-06 ENCOUNTER — Other Ambulatory Visit (INDEPENDENT_AMBULATORY_CARE_PROVIDER_SITE_OTHER): Payer: Self-pay | Admitting: Internal Medicine

## 2017-11-06 ENCOUNTER — Ambulatory Visit (INDEPENDENT_AMBULATORY_CARE_PROVIDER_SITE_OTHER): Payer: Medicare Other | Admitting: Otolaryngology

## 2017-11-06 ENCOUNTER — Other Ambulatory Visit (HOSPITAL_COMMUNITY): Payer: Self-pay | Admitting: Specialist

## 2017-11-06 DIAGNOSIS — T17908A Unspecified foreign body in respiratory tract, part unspecified causing other injury, initial encounter: Secondary | ICD-10-CM

## 2017-11-06 DIAGNOSIS — R1312 Dysphagia, oropharyngeal phase: Secondary | ICD-10-CM

## 2017-11-06 DIAGNOSIS — K219 Gastro-esophageal reflux disease without esophagitis: Secondary | ICD-10-CM | POA: Diagnosis not present

## 2017-11-06 DIAGNOSIS — R1319 Other dysphagia: Secondary | ICD-10-CM

## 2017-11-06 DIAGNOSIS — R1310 Dysphagia, unspecified: Secondary | ICD-10-CM

## 2017-11-06 NOTE — Telephone Encounter (Signed)
Referral placed in Epic, they will contact patient to schedule

## 2017-11-07 ENCOUNTER — Encounter: Payer: Self-pay | Admitting: Student

## 2017-11-07 ENCOUNTER — Ambulatory Visit (INDEPENDENT_AMBULATORY_CARE_PROVIDER_SITE_OTHER): Payer: Medicare Other | Admitting: Student

## 2017-11-07 VITALS — BP 104/56 | HR 96 | Ht 67.0 in | Wt 151.0 lb

## 2017-11-07 DIAGNOSIS — I441 Atrioventricular block, second degree: Secondary | ICD-10-CM

## 2017-11-07 DIAGNOSIS — I5042 Chronic combined systolic (congestive) and diastolic (congestive) heart failure: Secondary | ICD-10-CM | POA: Diagnosis not present

## 2017-11-07 DIAGNOSIS — I714 Abdominal aortic aneurysm, without rupture, unspecified: Secondary | ICD-10-CM

## 2017-11-07 MED ORDER — METOPROLOL SUCCINATE ER 25 MG PO TB24: 12.5000 mg | ORAL_TABLET | Freq: Every day | ORAL | 3 refills | Status: DC

## 2017-11-07 NOTE — Patient Instructions (Signed)
Medication Instructions:  Your physician has recommended you make the following change in your medication:  Start Toprol XL 12.5 mg Daily    Labwork:\ NONE   Testing/Procedures: NONE   Follow-Up: Your physician recommends that you schedule a follow-up appointment in: 2 months with Dr. Domenic Polite   Any Other Special Instructions Will Be Listed Below (If Applicable).     If you need a refill on your cardiac medications before your next appointment, please call your pharmacy. Thank you for choosing [facility]!

## 2017-11-07 NOTE — Progress Notes (Signed)
Cardiology Office Note    Date:  11/07/2017   ID:  JEVONTE CLANTON, DOB [birth year], MRN [id number]  PCP:  [name], MD  Cardiologist: Rozann Lesches, MD   EP: Dr. [name]  Chief Complaint  Patient presents with  . Follow-up    recent echocardiogram    History of Present Illness:    DASHON MCINTIRE is a 84 y.o. male with past medical history of symptomatic bradycardia (s/p St. Jude PPM placement in 2017), GERD, and AAA who presents to the office today for 4-week follow-up.   He was last examined by Dr. Domenic Polite on 10/10/2017 and reported that he had recently been informed by his PCP that he had "fluid around his heart" and was experiencing worsening dyspnea. He appeared euvolemic on examination and an echocardiogram was ordered for further assessment. This was performed on 10/17/2017 and showed a reduced EF of 25%, diffuse HK with akinesis of the apical myocardium, Grade 2 DD, mild AI, mild to moderate MR, and mild to moderate TR with PA peak pressure of 53 mm Hg (S). Close follow-up was arranged to initiate medical therapy as ischemic evaluation would likely not be pursued in the setting of his advanced age.   In talking with the patient today, he reports he has noticed worsening dyspnea on exertion over the past several months. Was also experiencing lower extremity edema but says this has improved since being started on Lasix.  Denies any recent orthopnea or PND. No recent chest discomfort or palpitations. He is not overly active at baseline but does ambulate around his house and in the store with a cane. The patient and his wife (who has dementia) have a caregiver present throughout the day. They do go out to eat regularly as he does not cook.   Past Medical History:  Diagnosis Date  . AAA (abdominal aortic aneurysm) ([place])   . Arthritis   . Chronic combined systolic (congestive) and diastolic (congestive) heart failure ([place])    a. 10/2017: echo showing a reduced EF of 25%, diffuse HK  with akinesis of the apical myocardium, Grade 2 DD, mild AI, mild to moderate MR, and mild to moderate TR  . GERD (gastroesophageal reflux disease)   . Heart block AV second degree    St. Jude pacemaker May 2017 - Dr. [name]  . Infection of bladder   . Joint pain   . Symptomatic bradycardia     Past Surgical History:  Procedure Laterality Date  . BACK SURGERY    . CATARACT EXTRACTION W/ INTRAOCULAR LENS  IMPLANT, BILATERAL Bilateral   . EP IMPLANTABLE DEVICE N/A 10/02/2015   Procedure: Pacemaker Implant;  Surgeon: Evans Lance, MD;  Location: [facility];  Service: Cardiovascular;  Laterality: N/A;  . EXCISIONAL HEMORRHOIDECTOMY    . HEMORROIDECTOMY    . INGUINAL HERNIA REPAIR Bilateral 1991  . INSERT / REPLACE / REMOVE PACEMAKER    . JOINT REPLACEMENT    . [place] SURGERY  2005  . TOTAL KNEE ARTHROPLASTY Bilateral 1998 -  2003  . TOTAL SHOULDER REPLACEMENT Left 1999   Partial  . TYMPANOSTOMY TUBE PLACEMENT Right     Current Medications: Outpatient Medications Prior to Visit  Medication Sig Dispense Refill  . furosemide (LASIX) 20 MG tablet Take 20 mg by mouth daily.    . potassium chloride (K-DUR,KLOR-CON) 10 MEQ tablet Take 10 mEq by mouth daily.    . psyllium (METAMUCIL SMOOTH TEXTURE) 28 % packet Take 1 packet  by mouth 2 (two) times daily.    . ranitidine (ZANTAC) 150 MG tablet Take 1 tablet by mouth daily.  6   No facility-administered medications prior to visit.      Allergies:   Percocet [oxycodone-acetaminophen]; Noroxin [norfloxacin]; and Celebrex [celecoxib]   Social History   Socioeconomic History  . Marital status: Married    Spouse name: Not on file  . Number of children: Not on file  . Years of education: Not on file  . Highest education level: Not on file  Occupational History  . Not on file  Social Needs  . Financial resource strain: Not on file  . Food insecurity:    Worry: Not on file    Inability: Not on file  . Transportation  needs:    Medical: Not on file    Non-medical: Not on file  Tobacco Use  . Smoking status: Former Smoker    Packs/day: 1.00    Years: 15.00    Pack years: 15.00    Types: Cigarettes    Last attempt to quit: 05/13/1950    Years since quitting: 67.5  . Smokeless tobacco: Never Used  Substance and Sexual Activity  . Alcohol use: No    Alcohol/week: 0.0 oz  . Drug use: No  . Sexual activity: Never  Lifestyle  . Physical activity:    Days per week: Not on file    Minutes per session: Not on file  . Stress: Not on file  Relationships  . Social connections:    Talks on phone: Not on file    Gets together: Not on file    Attends religious service: Not on file    Active member of club or organization: Not on file    Attends meetings of clubs or organizations: Not on file    Relationship status: Not on file  Other Topics Concern  . Not on file  Social History Narrative  . Not on file     Family History:  The patient's family history includes Cystic fibrosis in his paternal aunt; Heart disease in his father; Pneumonia in his mother.   Review of Systems:   Please see the history of present illness.     General:  No chills, fever, night sweats or weight changes.  Cardiovascular:  No chest pain, orthopnea, palpitations, paroxysmal nocturnal dyspnea. Positive for dyspnea on exertion and edema.  Dermatological: No rash, lesions/masses Respiratory: No cough, dyspnea Urologic: No hematuria, dysuria Abdominal:   No nausea, vomiting, diarrhea, bright red blood per rectum, melena, or hematemesis Neurologic:  No visual changes, wkns, changes in mental status. All other systems reviewed and are otherwise negative except as noted above.   Physical Exam:    VS:  BP (!) 104/56   Pulse 96   Ht 5\' 7"  (1.702 m)   Wt 151 lb (68.5 kg)   SpO2 98%   BMI 23.65 kg/m    General: Well developed, elderly Caucasian male appearing in no acute distress. Head: Normocephalic, atraumatic, sclera  non-icteric, no xanthomas, nares are without discharge.  Neck: No carotid bruits. JVD not elevated.  Lungs: Respirations regular and unlabored, without wheezes or rales.  Heart: Regular rate and rhythm. No S3 or S4.  No murmur, no rubs, or gallops appreciated. Abdomen: Soft, non-tender, non-distended with normoactive bowel sounds. No hepatomegaly. No rebound/guarding. No obvious abdominal masses. Msk:  Strength and tone appear normal for age. No joint deformities or effusions. Extremities: No clubbing or cyanosis. Trace lower extremity edema.  Distal pedal pulses are 2+ bilaterally. Neuro: Alert and oriented X 3. Moves all extremities spontaneously. No focal deficits noted. Psych:  Responds to questions appropriately with a normal affect. Skin: No rashes or lesions noted  Wt Readings from Last 3 Encounters:  11/07/17 151 lb (68.5 kg)  10/29/17 145 lb 11.2 oz (66.1 kg)  10/10/17 149 lb 12.8 oz (67.9 kg)     Studies/Labs Reviewed:   EKG:  EKG is not ordered today.   Recent Labs: 11/04/2017: BUN 17; Creatinine, Ser 1.07; Potassium 3.7; Sodium 138   Lipid Panel No results found for: CHOL, TRIG, HDL, CHOLHDL, VLDL, LDLCALC, LDLDIRECT  Additional studies/ records that were reviewed today include:   Echocardiogram: 10/2017 Study Conclusions  - Left ventricle: The cavity size was moderately dilated. Wall   thickness was normal. Systolic function was severely reduced. The   estimated ejection fraction was 25%. Diffuse hypokinesis. There   is dyskinesis of the anteroseptal myocardium. There is akinesis   of the apical myocardium. Features are consistent with a   pseudonormal left ventricular filling pattern, with concomitant   abnormal relaxation and increased filling pressure (grade 2   diastolic dysfunction). - Ventricular septum: Septal motion showed abnormal function and   dyssynergy. - Aortic valve: Mildly calcified annulus. Trileaflet; mildly   calcified leaflets. There was  mild regurgitation. - Mitral valve: Mildly calcified annulus. There was mild to   moderate regurgitation. - Left atrium: The atrium was moderately to severely dilated. - Right ventricle: Pacer wire or catheter noted in right ventricle. - Tricuspid valve: There was mild-moderate regurgitation. - Pulmonic valve: There was mild regurgitation. - Pulmonary arteries: PA peak pressure: 53 mm Hg (S). - Pericardium, extracardiac: There was no pericardial effusion.  Assessment:    1. Chronic combined systolic and diastolic heart failure ([place])   2. Second degree heart block   3. AAA (abdominal aortic aneurysm) without rupture ([place])      Plan:   In order of problems listed above:  1. Chronic Combined Systolic and Diastolic CHF - the patient has reported worsening dyspnea on exertion and edema over the past several months and recent echo showed a newly reduced EF of 25%, diffuse HK with akinesis of the apical myocardium, Grade 2 DD, mild AI, mild to moderate MR, and mild to moderate TR with PA peak pressure of 53 mm Hg (S).  - we reviewed the study today and he is in agreement that he does not want to undergo a catheterization at his age and thankfully he has not experienced any recent chest pain. We discussed the purpose of using medications for his cardiomyopathy and he is willing to try this. Will plan to add Toprol-XL 12.5mg  daily to his medication regimen. BP is soft at 104/56 today and was 120/76 at the time of his last office visit. He has a BP cuff at home and I have asked him to follow his BP with this additional medication. His soft BP will likely hinder further titration of this or the addition of an ACE-I or ARB. Would continue on Lasix 20mg  daily as he appears close to euvolemic on examination and creatinine was stable at 1.07 on 11/04/2017. Sodium and fluid restriction were also reviewed.   2. Second Degree Heart Block - s/p St Jude PPM placement in 2017. Followed by Dr. [name].   3.  AAA - followed by Vascular Surgery. At 5.6 cm by imaging in 06/2017.   Medication Adjustments/Labs and Tests Ordered: Current medicines are reviewed  at length with the patient today.  Concerns regarding medicines are outlined above.  Medication changes, Labs and Tests ordered today are listed in the Patient Instructions below. Patient Instructions  Medication Instructions:  Your physician has recommended you make the following change in your medication:  Start Toprol XL 12.5 mg Daily   Labwork: NONE   Testing/Procedures: NONE   Follow-Up: Your physician recommends that you schedule a follow-up appointment in: 2 months with Dr. Domenic Polite  Any Other Special Instructions Will Be Listed Below (If Applicable).  If you need a refill on your cardiac medications before your next appointment, please call your pharmacy. Thank you for choosing [facility]!    Signed, Erma Heritage, PA-C  11/07/2017 8:12 PM    [facility] [address] [place], [place] [address] Phone: [phone]

## 2017-11-10 ENCOUNTER — Ambulatory Visit: Payer: Medicare Other | Admitting: Cardiology

## 2017-11-17 ENCOUNTER — Ambulatory Visit (HOSPITAL_COMMUNITY): Payer: Medicare Other | Attending: Internal Medicine | Admitting: Speech Pathology

## 2017-11-17 ENCOUNTER — Ambulatory Visit (HOSPITAL_COMMUNITY)
Admission: RE | Admit: 2017-11-17 | Discharge: 2017-11-17 | Disposition: A | Discharge status: Home / Self Care | Payer: Medicare Other | Source: Ambulatory Visit | Attending: Internal Medicine | Admitting: Internal Medicine

## 2017-11-17 ENCOUNTER — Encounter (HOSPITAL_COMMUNITY): Payer: Self-pay | Admitting: *Deleted

## 2017-11-17 ENCOUNTER — Emergency Department (HOSPITAL_COMMUNITY): Payer: Medicare Other

## 2017-11-17 ENCOUNTER — Other Ambulatory Visit: Payer: Self-pay

## 2017-11-17 ENCOUNTER — Encounter (HOSPITAL_COMMUNITY): Payer: Self-pay | Admitting: Speech Pathology

## 2017-11-17 ENCOUNTER — Emergency Department (HOSPITAL_COMMUNITY)
Admission: EM | Admit: 2017-11-17 | Discharge: 2017-11-17 | Disposition: A | Discharge status: Home / Self Care | Payer: Medicare Other | Attending: Emergency Medicine | Admitting: Emergency Medicine

## 2017-11-17 DIAGNOSIS — Z79899 Other long term (current) drug therapy: Secondary | ICD-10-CM | POA: Diagnosis not present

## 2017-11-17 DIAGNOSIS — Z87891 Personal history of nicotine dependence: Secondary | ICD-10-CM | POA: Diagnosis not present

## 2017-11-17 DIAGNOSIS — Y999 Unspecified external cause status: Secondary | ICD-10-CM | POA: Insufficient documentation

## 2017-11-17 DIAGNOSIS — T17908A Unspecified foreign body in respiratory tract, part unspecified causing other injury, initial encounter: Secondary | ICD-10-CM

## 2017-11-17 DIAGNOSIS — S0190XA Unspecified open wound of unspecified part of head, initial encounter: Secondary | ICD-10-CM | POA: Diagnosis not present

## 2017-11-17 DIAGNOSIS — S098XXA Other specified injuries of head, initial encounter: Secondary | ICD-10-CM | POA: Diagnosis not present

## 2017-11-17 DIAGNOSIS — S0101XA Laceration without foreign body of scalp, initial encounter: Secondary | ICD-10-CM | POA: Insufficient documentation

## 2017-11-17 DIAGNOSIS — R1312 Dysphagia, oropharyngeal phase: Secondary | ICD-10-CM | POA: Diagnosis not present

## 2017-11-17 DIAGNOSIS — Z950 Presence of cardiac pacemaker: Secondary | ICD-10-CM | POA: Diagnosis not present

## 2017-11-17 DIAGNOSIS — T17320A Food in larynx causing asphyxiation, initial encounter: Secondary | ICD-10-CM | POA: Diagnosis not present

## 2017-11-17 DIAGNOSIS — I5042 Chronic combined systolic (congestive) and diastolic (congestive) heart failure: Secondary | ICD-10-CM | POA: Insufficient documentation

## 2017-11-17 DIAGNOSIS — Y92239 Unspecified place in hospital as the place of occurrence of the external cause: Secondary | ICD-10-CM | POA: Insufficient documentation

## 2017-11-17 DIAGNOSIS — Y939 Activity, unspecified: Secondary | ICD-10-CM | POA: Diagnosis not present

## 2017-11-17 DIAGNOSIS — R1310 Dysphagia, unspecified: Secondary | ICD-10-CM | POA: Insufficient documentation

## 2017-11-17 DIAGNOSIS — X58XXXA Exposure to other specified factors, initial encounter: Secondary | ICD-10-CM

## 2017-11-17 DIAGNOSIS — W19XXXA Unspecified fall, initial encounter: Secondary | ICD-10-CM | POA: Insufficient documentation

## 2017-11-17 DIAGNOSIS — S0990XA Unspecified injury of head, initial encounter: Secondary | ICD-10-CM | POA: Diagnosis not present

## 2017-11-17 NOTE — ED Notes (Signed)
Pt with skin tear to top of scalp, cleaned with wash cloth.  Pt denies LOC before or after fall. Pt denies taking blood thinners.

## 2017-11-17 NOTE — Therapy (Signed)
[place], [place], [address] Phone: [phone]   Fax:  [phone]  Modified Barium Swallow  Patient Details  Name: Shawn Frye MRN: [id number] Date of Birth: Dec 01, 1918 No data recorded  Encounter Date: 11/17/2017  End of Session - 11/17/17 1522    Visit Number  1    Number of Visits  1    Authorization Type  Medicare    SLP Start Time  1330    SLP Stop Time   1410    SLP Time Calculation (min)  40 min    Activity Tolerance  Patient tolerated treatment well       Past Medical History:  Diagnosis Date  . AAA (abdominal aortic aneurysm) ([place])   . Arthritis   . Chronic combined systolic (congestive) and diastolic (congestive) heart failure ([place])    a. 10/2017: echo showing a reduced EF of 25%, diffuse HK with akinesis of the apical myocardium, Grade 2 DD, mild AI, mild to moderate MR, and mild to moderate TR  . GERD (gastroesophageal reflux disease)   . Heart block AV second degree    St. Jude pacemaker May 2017 - Dr. [name]  . Infection of bladder   . Joint pain   . Symptomatic bradycardia     Past Surgical History:  Procedure Laterality Date  . BACK SURGERY    . CATARACT EXTRACTION W/ INTRAOCULAR LENS  IMPLANT, BILATERAL Bilateral   . EP IMPLANTABLE DEVICE N/A 10/02/2015   Procedure: Pacemaker Implant;  Surgeon: Evans Lance, MD;  Location: [facility];  Service: Cardiovascular;  Laterality: N/A;  . EXCISIONAL HEMORRHOIDECTOMY    . HEMORROIDECTOMY    . INGUINAL HERNIA REPAIR Bilateral 1991  . INSERT / REPLACE / REMOVE PACEMAKER    . JOINT REPLACEMENT    . [place] SURGERY  2005  . TOTAL KNEE ARTHROPLASTY Bilateral 1998 -  2003  . TOTAL SHOULDER REPLACEMENT Left 1999   Partial  . TYMPANOSTOMY TUBE PLACEMENT Right     There were no vitals filed for this visit.  Subjective Assessment - 11/17/17 1457    Subjective  "I think I am doing ok now."    Special Tests  MBSS    Currently in  Pain?  No/denies        General - 11/17/17 1458      General Information   Date of Onset  11/03/17    HPI  Mr. Shawn Frye is a 84 yo male who was referred for MBSS by Deberah Castle, NP due to dysphagia. Mr. Spellman was seen by GI in June for c/o "food going down slow" and occasional trouble swallowing water. Pt had barium swallow on 11/03/17 which showed: The patient a single episode of silent laryngeal penetration and aspiration with thick barium. The patient has slight retention in    Type of Study  MBS-Modified Barium Swallow Study    Diet Prior to this Study  Regular;Thin liquids    Temperature Spikes Noted  No    Respiratory Status  Room air    History of Recent Intubation  No    Behavior/Cognition  Alert;Cooperative;Pleasant mood    Oral Cavity Assessment  Within Functional Limits    Oral Care Completed by SLP  No    Oral Cavity - Dentition  Dentures, bottom;Dentures, top    Vision  Functional for self feeding    Self-Feeding Abilities  Able to feed self    Patient Positioning  Upright in chair    Baseline Vocal Quality  Normal    Volitional Cough  Strong    Volitional Swallow  Able to elicit    Anatomy  Within functional limits    Pharyngeal Secretions  Not observed secondary MBS         Oral Preparation/Oral Phase - 11/17/17 1459      Oral Preparation/Oral Phase   Oral Phase  Within functional limits      Electrical stimulation - Oral Phase   Was Electrical Stimulation Used  No       Pharyngeal Phase - 11/17/17 1459      Pharyngeal Phase   Pharyngeal Phase  Impaired      Pharyngeal - Thin   Pharyngeal- Thin Teaspoon  Swallow initiation at vallecula    Pharyngeal- Thin Cup  Swallow initiation at vallecula;Pharyngeal residue - valleculae;Lateral channel residue min residuals post swallow    Pharyngeal- Thin Straw  Swallow initiation at vallecula;Penetration/Aspiration during swallow;Lateral channel residue;Pharyngeal residue - valleculae    Pharyngeal  Material  does not enter airway;Material enters airway, remains ABOVE vocal cords then ejected out      Pharyngeal - Solids   Pharyngeal- Puree  Within functional limits    Pharyngeal- Regular  Within functional limits    Pharyngeal- Pill  Within functional limits penetration of thins when taking pill      Electrical Stimulation - Pharyngeal Phase   Was Electrical Stimulation Used  No       Cricopharyngeal Phase - 11/17/17 1521      Cervical Esophageal Phase   Cervical Esophageal Phase  Within functional limits        Plan - 11/17/17 1526    Clinical Impression Statement Pt presents with mild pharyngeal phase dysphagia characterized by swallow trigger at the level of the valleculae with thin liquids, adequate hyolaryngeal excursion, trace penetration during the swallow when taking straw sips or sequential cup sips thin without observed aspiration. Pt with min vallecular and lateral channel pooling post swallow which cleared with dry swallow. Recommend regular textures and thin liquids with standard aspiration and reflux precautions. Pt encouraged to take small sips, go slowly, and clear throat periodically and dry swallow after liquids. Pt has had no reported bouts of PNA. No further SLP services indicated at this time.       Patient will benefit from skilled therapeutic intervention in order to improve the following deficits and impairments:   Dysphagia, oropharyngeal phase    Recommendations/Treatment - 11/17/17 1521      Swallow Evaluation Recommendations   SLP Diet Recommendations  Age appropriate regular;Thin    Liquid Administration via  Cup;Straw    Medication Administration  Whole meds with liquid    Supervision  Patient able to self feed    Compensations  Slow rate;Small sips/bites;Clear throat intermittently;Multiple dry swallows after each bite/sip    Postural Changes  Seated upright at 90 degrees;Remain upright for at least 30 minutes after feeds/meals       Prognosis -  11/17/17 1522      Prognosis   Prognosis for Safe Diet Advancement  Good      Individuals Consulted   Consulted and Agree with Results and Recommendations  Patient    Report Sent to   Referring physician       Problem List Patient Active Problem List   Diagnosis Date Noted  . Primary osteoarthritis, right shoulder 01/08/2017  . Mobitz type 2 second degree atrioventricular block 10/02/2015  . Bradycardia  08/29/2015  . Abdominal aneurysm without mention of rupture 03/21/2011   Thank you,  Genene Churn, [place]  [facility] 11/17/2017, 3:27 PM  [place] [address] [place], [place], [address] Phone: [phone]   Fax:  [phone]  Name: KASTON FAUGHN MRN: [id number] Date of Birth: 14-Mar-1919

## 2017-11-17 NOTE — ED Notes (Signed)
Patient transported to CT 

## 2017-11-17 NOTE — Discharge Instructions (Addendum)
X-ray of your head showed no blood clot.  Simple dressing to head wound.

## 2017-11-17 NOTE — ED Triage Notes (Signed)
Pt brought in by [facility] for c/o fal and laceration to left side of head; pt states he got tripped up with his feet; pt denies any pain at this time and bleeding controlled

## 2017-11-18 LAB — CUP PACEART REMOTE DEVICE CHECK
Battery Remaining Percentage: 95.5 %
Battery Voltage: 2.99 V
Brady Statistic AP VP Percent: 3.6 %
Brady Statistic AP VS Percent: 1 %
Brady Statistic AS VP Percent: 96 %
Brady Statistic AS VS Percent: 1 %
Brady Statistic RV Percent Paced: 99 %
Implantable Lead Implant Date: 20170522
Implantable Lead Location: 753860
Implantable Pulse Generator Implant Date: 20170522
Lead Channel Impedance Value: 390 Ohm
Lead Channel Impedance Value: 440 Ohm
Lead Channel Pacing Threshold Amplitude: 0.75 V
Lead Channel Pacing Threshold Amplitude: 0.75 V
Lead Channel Pacing Threshold Pulse Width: 0.5 ms
Lead Channel Sensing Intrinsic Amplitude: 0.8 mV
Lead Channel Setting Pacing Amplitude: 2.5 V
Lead Channel Setting Pacing Pulse Width: 0.5 ms
MDC IDC LEAD IMPLANT DT: 20170522
MDC IDC LEAD LOCATION: 753859
MDC IDC MSMT BATTERY REMAINING LONGEVITY: 102 mo
MDC IDC MSMT LEADCHNL RA PACING THRESHOLD PULSEWIDTH: 0.5 ms
MDC IDC MSMT LEADCHNL RV SENSING INTR AMPL: 12 mV
MDC IDC PG SERIAL: 7899864
MDC IDC SESS DTM: 20190620080012
MDC IDC SET LEADCHNL RA PACING AMPLITUDE: 2 V
MDC IDC SET LEADCHNL RV SENSING SENSITIVITY: 2 mV
MDC IDC STAT BRADY RA PERCENT PACED: 3.3 %

## 2017-11-18 NOTE — ED Provider Notes (Signed)
[facility] EMERGENCY DEPARTMENT Provider Note   CSN: [id number] Arrival date & time: 11/17/17  1929     History   Chief Complaint Chief Complaint  Patient presents with  . Laceration    HPI Shawn Frye is a 84 y.o. male.  Accidental trip and fall striking the left parietal scalp while visiting his wife in the hospital just prior to ED visit.  No other injuries.  Specifically no loss of consciousness, neurological deficits, neck pain, extremity pain.  Severity of pain is minimal.  There is a small superficial 2.5 cm laceration on the left parietal scalp.  No prodromal symptoms prior to fall.     Past Medical History:  Diagnosis Date  . AAA (abdominal aortic aneurysm) ([place])   . Arthritis   . Chronic combined systolic (congestive) and diastolic (congestive) heart failure ([place])    a. 10/2017: echo showing a reduced EF of 25%, diffuse HK with akinesis of the apical myocardium, Grade 2 DD, mild AI, mild to moderate MR, and mild to moderate TR  . GERD (gastroesophageal reflux disease)   . Heart block AV second degree    St. Jude pacemaker May 2017 - Dr. [name]  . Infection of bladder   . Joint pain   . Symptomatic bradycardia     Patient Active Problem List   Diagnosis Date Noted  . Primary osteoarthritis, right shoulder 01/08/2017  . Mobitz type 2 second degree atrioventricular block 10/02/2015  . Bradycardia 08/29/2015  . Abdominal aneurysm without mention of rupture 03/21/2011    Past Surgical History:  Procedure Laterality Date  . BACK SURGERY    . CATARACT EXTRACTION W/ INTRAOCULAR LENS  IMPLANT, BILATERAL Bilateral   . EP IMPLANTABLE DEVICE N/A 10/02/2015   Procedure: Pacemaker Implant;  Surgeon: Evans Lance, MD;  Location: [facility];  Service: Cardiovascular;  Laterality: N/A;  . EXCISIONAL HEMORRHOIDECTOMY    . HEMORROIDECTOMY    . INGUINAL HERNIA REPAIR Bilateral 1991  . INSERT / REPLACE / REMOVE PACEMAKER    . JOINT REPLACEMENT    . [place]  SURGERY  2005  . TOTAL KNEE ARTHROPLASTY Bilateral 1998 -  2003  . TOTAL SHOULDER REPLACEMENT Left 1999   Partial  . TYMPANOSTOMY TUBE PLACEMENT Right         Home Medications    Prior to Admission medications   Medication Sig Start Date End Date Taking? Authorizing Provider  furosemide (LASIX) 20 MG tablet Take 20 mg by mouth daily.   Yes [provider]  metoprolol succinate (TOPROL XL) 25 MG 24 hr tablet Take 0.5 tablets (12.5 mg total) by mouth daily. 11/07/17  Yes Strader, [place] M, PA-C  omeprazole (PRILOSEC) 20 MG capsule Take 20 mg by mouth daily.   Yes [provider]  potassium chloride (K-DUR,KLOR-CON) 10 MEQ tablet Take 10 mEq by mouth daily.   Yes [provider]  psyllium (METAMUCIL SMOOTH TEXTURE) 28 % packet Take 1 packet by mouth every morning.    Yes [provider]    Family History Family History  Problem Relation Age of Onset  . Heart disease Father        NOT  before age 16  . Pneumonia Mother   . Cystic fibrosis Paternal Aunt   . Kidney disease Neg Hx     Social History Social History   Tobacco Use  . Smoking status: Former Smoker    Packs/day: 1.00    Years: 15.00    Pack  years: 15.00    Types: Cigarettes    Last attempt to quit: 05/13/1950    Years since quitting: 67.5  . Smokeless tobacco: Never Used  Substance Use Topics  . Alcohol use: No    Alcohol/week: 0.0 oz  . Drug use: No     Allergies   Percocet [oxycodone-acetaminophen]; Noroxin [norfloxacin]; and Celebrex [celecoxib]   Review of Systems Review of Systems  All other systems reviewed and are negative.    Physical Exam Updated Vital Signs BP 96/79 (BP Location: Right Arm)   Pulse 83   Temp 98 F (36.7 C) (Oral)   Resp 18   Ht 5\' 7"  (1.702 m)   Wt 68.5 kg (151 lb)   SpO2 96%   BMI 23.65 kg/m   Physical Exam  Constitutional: He is oriented to person, place, and time. He appears well-developed and well-nourished.  HENT:  Head:  Normocephalic.  3.0 cm superficial left parietal scalp laceration.  Eyes: Conjunctivae are normal.  Neck: Neck supple.  Cardiovascular: Normal rate and regular rhythm.  Pulmonary/Chest: Effort normal and breath sounds normal.  Abdominal: Soft. Bowel sounds are normal.  Musculoskeletal: Normal range of motion.  Neurological: He is alert and oriented to person, place, and time.  Skin: Skin is warm and dry.  Psychiatric: He has a normal mood and affect. His behavior is normal.  Nursing note and vitals reviewed.    ED Treatments / Results  Labs (all labs ordered are listed, but only abnormal results are displayed) Labs Reviewed - No data to display  EKG None  Radiology Ct Head Wo Contrast  Result Date: 11/17/2017 CLINICAL DATA:  84 year old male with history of trauma from a fall with tear on the top of the scalp. EXAM: CT HEAD WITHOUT CONTRAST TECHNIQUE: Contiguous axial images were obtained from the base of the skull through the vertex without intravenous contrast. COMPARISON:  None. FINDINGS: Brain: Mild cerebral atrophy. Patchy and confluent areas of decreased attenuation are noted throughout the deep and periventricular white matter of the cerebral hemispheres bilaterally, compatible with chronic microvascular ischemic disease. No evidence of acute infarction, hemorrhage, hydrocephalus, extra-axial collection or mass lesion/mass effect. Vascular: No hyperdense vessel or unexpected calcification. Skull: Normal. Negative for fracture or focal lesion. Sinuses/Orbits: Diminutive and completely opacified right maxillary sinus, likely sequela of longstanding chronic sinusitis. No acute finding. Other: None. IMPRESSION: 1. No acute intracranial abnormalities. 2. Mild cerebral atrophy with chronic microvascular ischemic changes in the cerebral white matter, as above. Electronically Signed   By: Vinnie Langton M.D.   On: 11/17/2017 21:35   Dg Swallowing Func-speech Pathology  Result Date:  11/17/2017 CLINICAL DATA:  Silent aspiration. EXAM: MODIFIED BARIUM SWALLOW TECHNIQUE: Different consistencies of barium were administered orally to the patient by the Speech Pathologist. Imaging of the pharynx was performed in the lateral projection. FLUOROSCOPY TIME:  Fluoroscopy Time:  1 minutes 48 seconds COMPARISON:  None. FINDINGS: Thin liquid-the patient had several episodes of flash laryngeal penetration. Pure- within normal limits Pure with cracker- within normal limits Barium tablet -  within normal limits IMPRESSION: Recurrent flash laryngeal penetration with thin liquids. Otherwise, normal exam. Please refer to the Speech Pathologists report for complete details and recommendations. Electronically Signed   By: Lorriane Shire M.D.   On: 11/17/2017 15:56    Procedures Procedures (including critical care time)  Medications Ordered in ED Medications - No data to display   Initial Impression / Assessment and Plan / ED Course  I have reviewed the triage  vital signs and the nursing notes.  Pertinent labs & imaging results that were available during my care of the patient were reviewed by me and considered in my medical decision making (see chart for details).     Patient presents with accidental trip and fall striking the left parietal scalp.  No neuro deficits.  CT head negative.  No suturing necessary for scalp wound.  Patient was observed for approximately 2 hours with no change in his behavior.  Final Clinical Impressions(s) / ED Diagnoses   Final diagnoses:  Fall, initial encounter  Laceration of scalp, initial encounter    ED Discharge Orders    None       Nat Christen, MD 11/18/17 [phone]

## 2017-11-19 NOTE — Progress Notes (Signed)
I have spoken with patient. Verbalizes understanding of instructions from Speech Pathology. Will see back in 3 months.

## 2017-11-21 DIAGNOSIS — N401 Enlarged prostate with lower urinary tract symptoms: Secondary | ICD-10-CM | POA: Diagnosis not present

## 2017-11-21 DIAGNOSIS — J449 Chronic obstructive pulmonary disease, unspecified: Secondary | ICD-10-CM | POA: Diagnosis not present

## 2017-11-21 DIAGNOSIS — I5033 Acute on chronic diastolic (congestive) heart failure: Secondary | ICD-10-CM | POA: Diagnosis not present

## 2017-11-21 DIAGNOSIS — D509 Iron deficiency anemia, unspecified: Secondary | ICD-10-CM | POA: Diagnosis not present

## 2017-12-01 DIAGNOSIS — I5033 Acute on chronic diastolic (congestive) heart failure: Secondary | ICD-10-CM | POA: Diagnosis not present

## 2017-12-01 DIAGNOSIS — Z6824 Body mass index (BMI) 24.0-24.9, adult: Secondary | ICD-10-CM | POA: Diagnosis not present

## 2017-12-01 DIAGNOSIS — R0602 Shortness of breath: Secondary | ICD-10-CM | POA: Diagnosis not present

## 2017-12-02 IMAGING — DX DG CHEST 2V
2 series · 2 of 2 positions shown · non-contrast
Comparison: 08/04/2011

CLINICAL DATA: Bradycardia, former smoker

EXAM:
CHEST  2 VIEW

[chest pa]
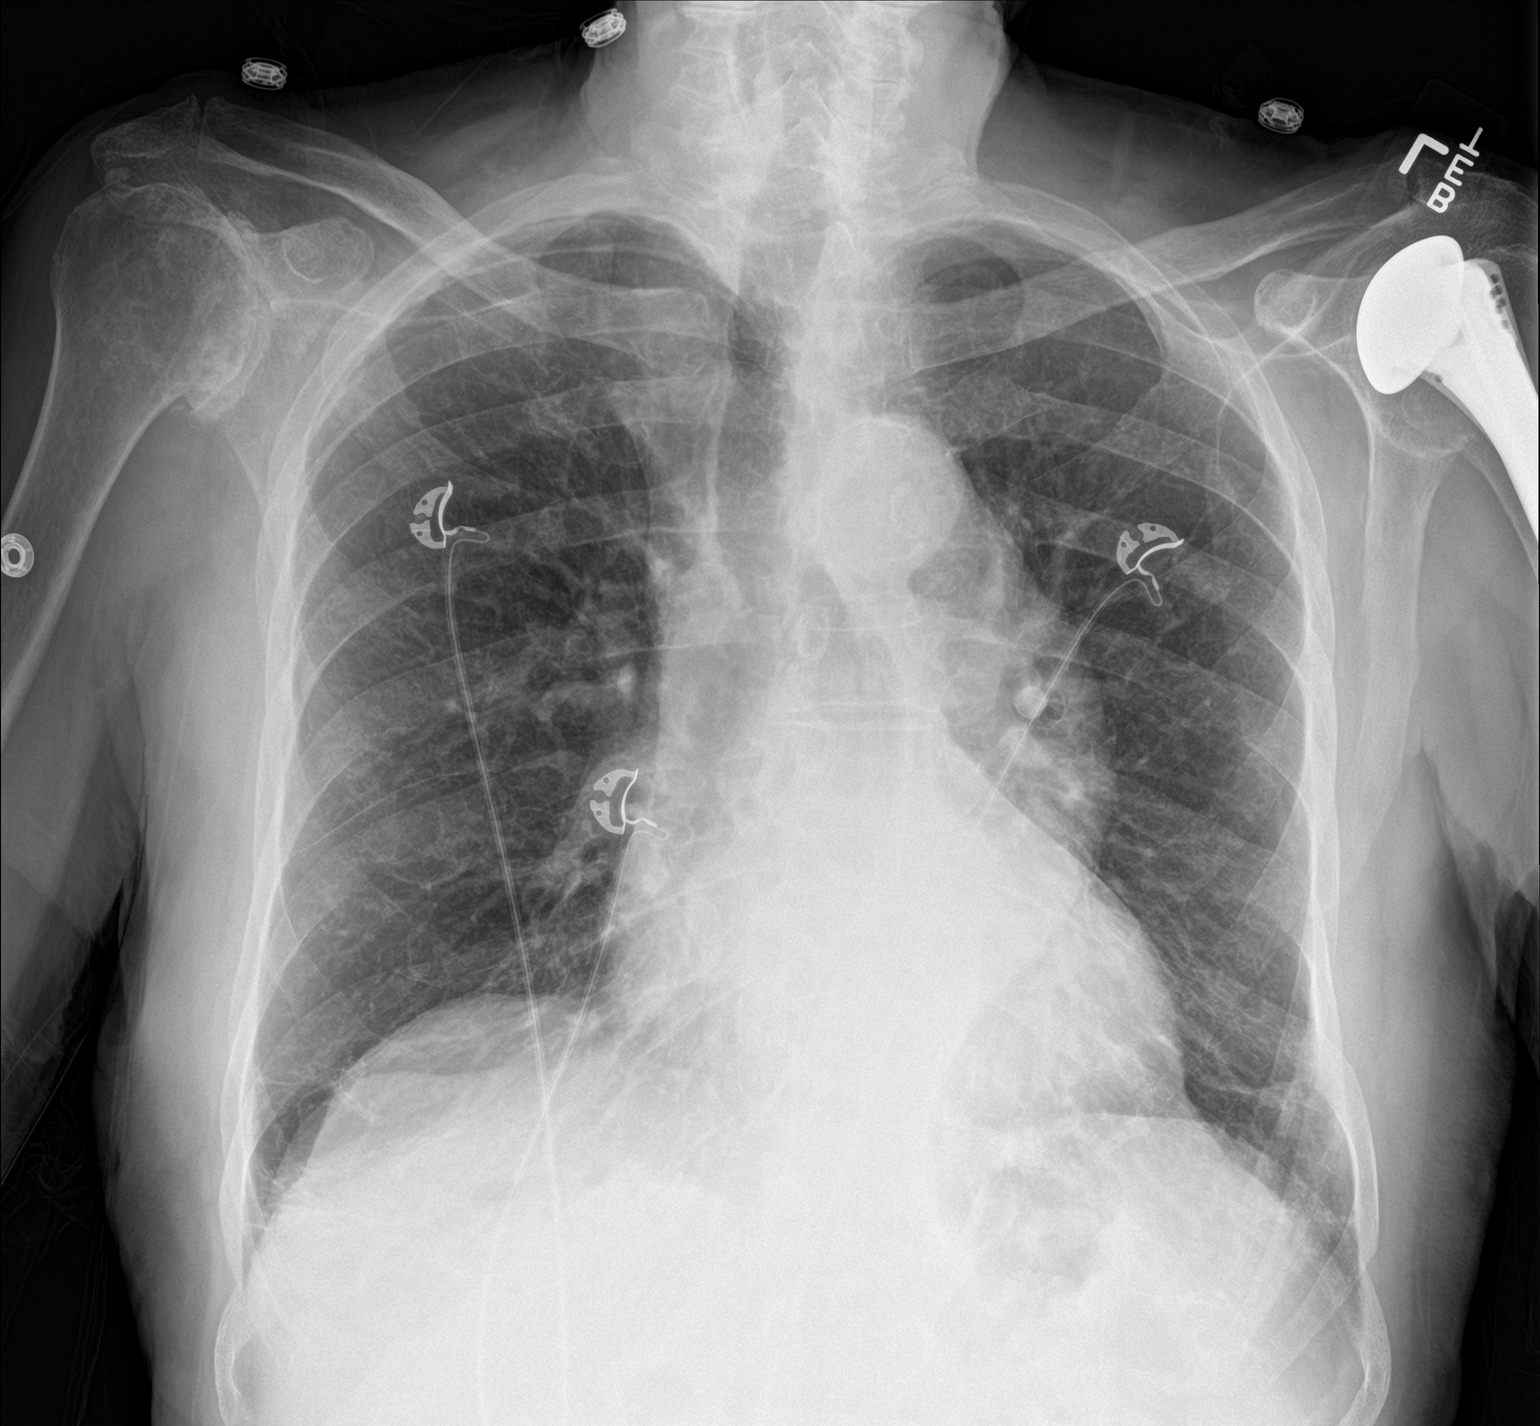

[chest lat]
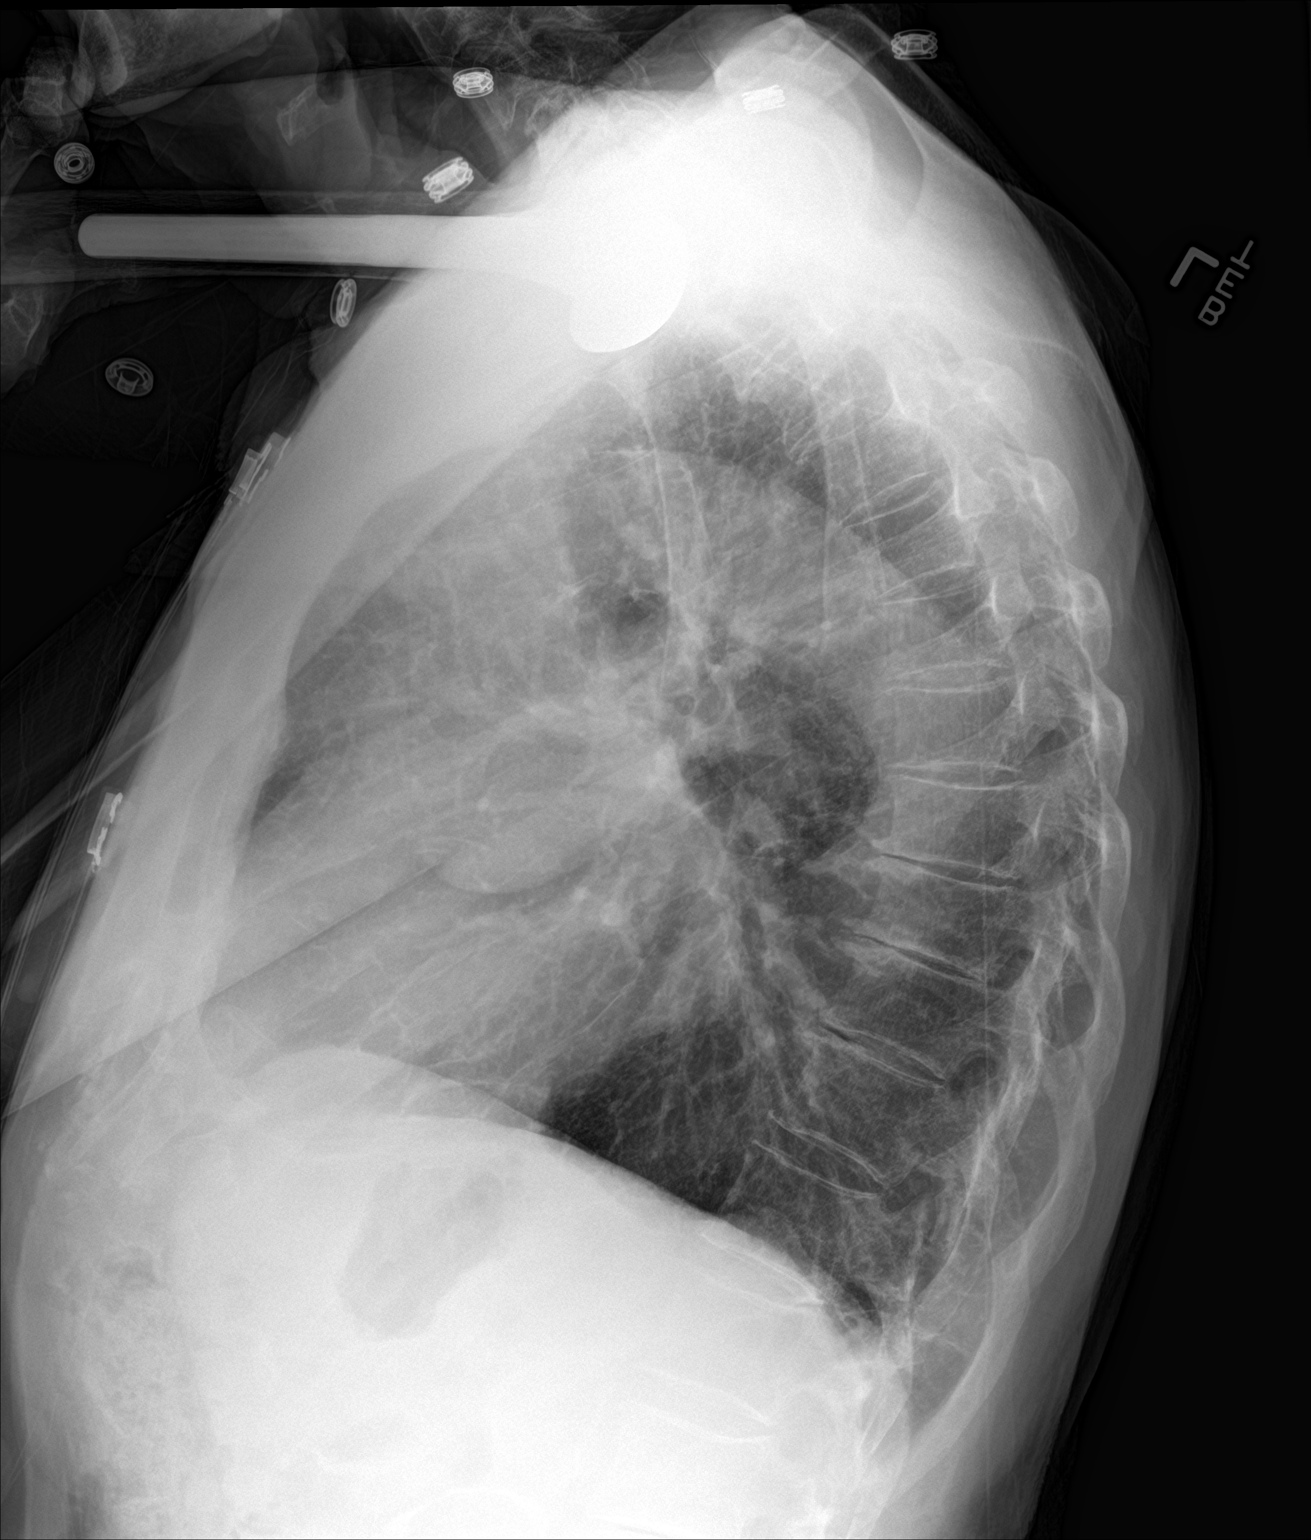

[2 of 2 positions shown; findings below may reference images not displayed]

FINDINGS: Upper normal heart size.

Calcified tortuous thoracic aorta.

Pulmonary vascularity normal.

Emphysematous changes with minimal bibasilar atelectasis versus
scarring.

Remaining lungs clear.

No definite infiltrate, pleural effusion or pneumothorax.

Bones demineralized with note of LEFT shoulder prosthesis and RIGHT
glenohumeral degenerative changes.
IMPRESSION: COPD changes with bibasilar atelectasis versus scarring.

## 2017-12-04 ENCOUNTER — Ambulatory Visit (HOSPITAL_COMMUNITY)
Admission: RE | Admit: 2017-12-04 | Discharge: 2017-12-04 | Disposition: A | Discharge status: Home / Self Care | Payer: Medicare Other | Source: Ambulatory Visit | Attending: Internal Medicine | Admitting: Internal Medicine

## 2017-12-04 ENCOUNTER — Other Ambulatory Visit: Payer: Self-pay | Admitting: Internal Medicine

## 2017-12-04 DIAGNOSIS — Z6824 Body mass index (BMI) 24.0-24.9, adult: Secondary | ICD-10-CM | POA: Diagnosis not present

## 2017-12-04 DIAGNOSIS — I5042 Chronic combined systolic (congestive) and diastolic (congestive) heart failure: Secondary | ICD-10-CM | POA: Diagnosis not present

## 2017-12-04 DIAGNOSIS — R600 Localized edema: Secondary | ICD-10-CM | POA: Diagnosis not present

## 2017-12-04 DIAGNOSIS — R0602 Shortness of breath: Secondary | ICD-10-CM

## 2017-12-04 DIAGNOSIS — D649 Anemia, unspecified: Secondary | ICD-10-CM | POA: Diagnosis not present

## 2017-12-04 DIAGNOSIS — R05 Cough: Secondary | ICD-10-CM | POA: Diagnosis not present

## 2017-12-04 DIAGNOSIS — Z712 Person consulting for explanation of examination or test findings: Secondary | ICD-10-CM | POA: Diagnosis not present

## 2017-12-04 DIAGNOSIS — J90 Pleural effusion, not elsewhere classified: Secondary | ICD-10-CM | POA: Diagnosis not present

## 2017-12-11 ENCOUNTER — Telehealth: Payer: Self-pay | Admitting: Internal Medicine

## 2017-12-11 ENCOUNTER — Other Ambulatory Visit: Payer: Self-pay

## 2017-12-11 DIAGNOSIS — Z6823 Body mass index (BMI) 23.0-23.9, adult: Secondary | ICD-10-CM | POA: Diagnosis not present

## 2017-12-11 DIAGNOSIS — D649 Anemia, unspecified: Secondary | ICD-10-CM | POA: Diagnosis not present

## 2017-12-11 DIAGNOSIS — I5043 Acute on chronic combined systolic (congestive) and diastolic (congestive) heart failure: Secondary | ICD-10-CM | POA: Diagnosis not present

## 2017-12-11 DIAGNOSIS — F419 Anxiety disorder, unspecified: Secondary | ICD-10-CM | POA: Diagnosis not present

## 2017-12-11 NOTE — Telephone Encounter (Signed)
Person who ordered cxr needs to give them the results, which was Dr.hall's NP   Attempt to reach, no vm set up

## 2017-12-11 NOTE — Telephone Encounter (Signed)
Daughter in law spoke with Dr Juel Burrow office and got results

## 2017-12-11 NOTE — Telephone Encounter (Signed)
Pt is having SOB and is having problems w/ fluid build up. Dr. Juel Burrow NP gave him something to help w/ it.  Pt had a chest xray here and would like someone to look at that.   Please call [phone] this is pt's sister in law [place]. She's helping him and he's with her. Other

## 2017-12-13 ENCOUNTER — Encounter (HOSPITAL_COMMUNITY): Payer: Self-pay | Admitting: Emergency Medicine

## 2017-12-13 ENCOUNTER — Other Ambulatory Visit: Payer: Self-pay

## 2017-12-13 ENCOUNTER — Emergency Department (HOSPITAL_COMMUNITY): Payer: Medicare Other

## 2017-12-13 ENCOUNTER — Inpatient Hospital Stay (HOSPITAL_COMMUNITY)
Admission: EM | Admit: 2017-12-13 | Discharge: 2017-12-18 | DRG: 291 | Disposition: A | Discharge status: Home / Self Care | Payer: Medicare Other | Attending: Internal Medicine | Admitting: Internal Medicine

## 2017-12-13 DIAGNOSIS — I351 Nonrheumatic aortic (valve) insufficiency: Secondary | ICD-10-CM | POA: Diagnosis not present

## 2017-12-13 DIAGNOSIS — Z961 Presence of intraocular lens: Secondary | ICD-10-CM | POA: Diagnosis present

## 2017-12-13 DIAGNOSIS — Z9841 Cataract extraction status, right eye: Secondary | ICD-10-CM

## 2017-12-13 DIAGNOSIS — Z9842 Cataract extraction status, left eye: Secondary | ICD-10-CM

## 2017-12-13 DIAGNOSIS — J9601 Acute respiratory failure with hypoxia: Secondary | ICD-10-CM | POA: Diagnosis not present

## 2017-12-13 DIAGNOSIS — M1990 Unspecified osteoarthritis, unspecified site: Secondary | ICD-10-CM | POA: Diagnosis present

## 2017-12-13 DIAGNOSIS — Z885 Allergy status to narcotic agent status: Secondary | ICD-10-CM | POA: Diagnosis not present

## 2017-12-13 DIAGNOSIS — I420 Dilated cardiomyopathy: Secondary | ICD-10-CM | POA: Diagnosis present

## 2017-12-13 DIAGNOSIS — I5023 Acute on chronic systolic (congestive) heart failure: Secondary | ICD-10-CM | POA: Diagnosis not present

## 2017-12-13 DIAGNOSIS — K219 Gastro-esophageal reflux disease without esophagitis: Secondary | ICD-10-CM | POA: Diagnosis present

## 2017-12-13 DIAGNOSIS — Z87891 Personal history of nicotine dependence: Secondary | ICD-10-CM | POA: Diagnosis not present

## 2017-12-13 DIAGNOSIS — R0689 Other abnormalities of breathing: Secondary | ICD-10-CM | POA: Diagnosis not present

## 2017-12-13 DIAGNOSIS — I5042 Chronic combined systolic (congestive) and diastolic (congestive) heart failure: Secondary | ICD-10-CM | POA: Diagnosis not present

## 2017-12-13 DIAGNOSIS — M6281 Muscle weakness (generalized): Secondary | ICD-10-CM | POA: Diagnosis not present

## 2017-12-13 DIAGNOSIS — Z96653 Presence of artificial knee joint, bilateral: Secondary | ICD-10-CM | POA: Diagnosis present

## 2017-12-13 DIAGNOSIS — Z96612 Presence of left artificial shoulder joint: Secondary | ICD-10-CM | POA: Diagnosis present

## 2017-12-13 DIAGNOSIS — I714 Abdominal aortic aneurysm, without rupture, unspecified: Secondary | ICD-10-CM | POA: Diagnosis present

## 2017-12-13 DIAGNOSIS — E871 Hypo-osmolality and hyponatremia: Secondary | ICD-10-CM | POA: Diagnosis present

## 2017-12-13 DIAGNOSIS — Z888 Allergy status to other drugs, medicaments and biological substances status: Secondary | ICD-10-CM | POA: Diagnosis not present

## 2017-12-13 DIAGNOSIS — I442 Atrioventricular block, complete: Secondary | ICD-10-CM | POA: Diagnosis present

## 2017-12-13 DIAGNOSIS — I5043 Acute on chronic combined systolic (congestive) and diastolic (congestive) heart failure: Secondary | ICD-10-CM | POA: Diagnosis not present

## 2017-12-13 DIAGNOSIS — R488 Other symbolic dysfunctions: Secondary | ICD-10-CM | POA: Diagnosis not present

## 2017-12-13 DIAGNOSIS — Z79899 Other long term (current) drug therapy: Secondary | ICD-10-CM

## 2017-12-13 DIAGNOSIS — R0682 Tachypnea, not elsewhere classified: Secondary | ICD-10-CM | POA: Diagnosis not present

## 2017-12-13 DIAGNOSIS — Z950 Presence of cardiac pacemaker: Secondary | ICD-10-CM | POA: Diagnosis not present

## 2017-12-13 DIAGNOSIS — R300 Dysuria: Secondary | ICD-10-CM | POA: Diagnosis not present

## 2017-12-13 DIAGNOSIS — Z9981 Dependence on supplemental oxygen: Secondary | ICD-10-CM | POA: Diagnosis not present

## 2017-12-13 DIAGNOSIS — R2689 Other abnormalities of gait and mobility: Secondary | ICD-10-CM | POA: Diagnosis not present

## 2017-12-13 DIAGNOSIS — R001 Bradycardia, unspecified: Secondary | ICD-10-CM | POA: Diagnosis present

## 2017-12-13 DIAGNOSIS — R0601 Orthopnea: Secondary | ICD-10-CM | POA: Diagnosis not present

## 2017-12-13 DIAGNOSIS — R609 Edema, unspecified: Secondary | ICD-10-CM | POA: Diagnosis not present

## 2017-12-13 DIAGNOSIS — R262 Difficulty in walking, not elsewhere classified: Secondary | ICD-10-CM | POA: Diagnosis not present

## 2017-12-13 DIAGNOSIS — I071 Rheumatic tricuspid insufficiency: Secondary | ICD-10-CM | POA: Diagnosis present

## 2017-12-13 DIAGNOSIS — E8770 Fluid overload, unspecified: Secondary | ICD-10-CM | POA: Diagnosis not present

## 2017-12-13 DIAGNOSIS — R0602 Shortness of breath: Secondary | ICD-10-CM | POA: Diagnosis not present

## 2017-12-13 DIAGNOSIS — D649 Anemia, unspecified: Secondary | ICD-10-CM | POA: Diagnosis not present

## 2017-12-13 DIAGNOSIS — M7989 Other specified soft tissue disorders: Secondary | ICD-10-CM | POA: Diagnosis not present

## 2017-12-13 DIAGNOSIS — J9611 Chronic respiratory failure with hypoxia: Secondary | ICD-10-CM | POA: Diagnosis not present

## 2017-12-13 LAB — URINALYSIS, ROUTINE W REFLEX MICROSCOPIC
Bilirubin Urine: NEGATIVE
Glucose, UA: NEGATIVE mg/dL
KETONES UR: NEGATIVE mg/dL
Nitrite: NEGATIVE
PROTEIN: 30 mg/dL — AB
RBC / HPF: >50 RBC/hpf — ABNORMAL HIGH (ref 0–5)
Specific Gravity, Urine: 1.012 (ref 1.005–1.030)
pH: 6 (ref 5.0–8.0)

## 2017-12-13 LAB — COMPREHENSIVE METABOLIC PANEL
ALBUMIN: 3.6 g/dL (ref 3.5–5.0)
ALK PHOS: 100 U/L (ref 38–126)
ALT: 14 U/L (ref 0–44)
ANION GAP: 10 (ref 5–15)
AST: 27 U/L (ref 15–41)
BILIRUBIN TOTAL: 2 mg/dL — AB (ref 0.3–1.2)
BUN: 21 mg/dL (ref 8–23)
CALCIUM: 8.8 mg/dL — AB (ref 8.9–10.3)
CO2: 29 mmol/L (ref 22–32)
CREATININE: 0.98 mg/dL (ref 0.61–1.24)
Chloride: 80 mmol/L — ABNORMAL LOW (ref 98–111)
GFR calc Af Amer: >60 mL/min (ref >60)
GFR calc non Af Amer: >60 mL/min (ref >60)
GLUCOSE: 137 mg/dL — AB (ref 70–99)
Potassium: 3.1 mmol/L — ABNORMAL LOW (ref 3.5–5.1)
Sodium: 119 mmol/L — CL (ref 135–145)
TOTAL PROTEIN: 7.4 g/dL (ref 6.5–8.1)

## 2017-12-13 LAB — CBC WITH DIFFERENTIAL/PLATELET
Basophils Absolute: 0 10*3/uL (ref 0.0–0.1)
Basophils Relative: 0 %
EOS ABS: 0.1 10*3/uL (ref 0.0–0.7)
Eosinophils Relative: 0 %
HCT: 32.3 % — ABNORMAL LOW (ref 39.0–52.0)
HEMOGLOBIN: 10.8 g/dL — AB (ref 13.0–17.0)
LYMPHS ABS: 1 10*3/uL (ref 0.7–4.0)
Lymphocytes Relative: 8 %
MCH: 28.4 pg (ref 26.0–34.0)
MCHC: 33.4 g/dL (ref 30.0–36.0)
MCV: 85 fL (ref 78.0–100.0)
MONO ABS: 1.8 10*3/uL — AB (ref 0.1–1.0)
MONOS PCT: 15 %
Neutro Abs: 9 10*3/uL — ABNORMAL HIGH (ref 1.7–7.7)
Neutrophils Relative %: 77 %
Platelets: 185 10*3/uL (ref 150–400)
RBC: 3.8 MIL/uL — ABNORMAL LOW (ref 4.22–5.81)
RDW: 14.6 % (ref 11.5–15.5)
WBC: 11.8 10*3/uL — ABNORMAL HIGH (ref 4.0–10.5)

## 2017-12-13 LAB — BASIC METABOLIC PANEL
ANION GAP: 9 (ref 5–15)
BUN: 19 mg/dL (ref 8–23)
CO2: 29 mmol/L (ref 22–32)
Calcium: 8.4 mg/dL — ABNORMAL LOW (ref 8.9–10.3)
Chloride: 81 mmol/L — ABNORMAL LOW (ref 98–111)
Creatinine, Ser: 0.82 mg/dL (ref 0.61–1.24)
GFR calc Af Amer: >60 mL/min (ref >60)
GFR calc non Af Amer: >60 mL/min (ref >60)
Glucose, Bld: 124 mg/dL — ABNORMAL HIGH (ref 70–99)
POTASSIUM: 3.2 mmol/L — AB (ref 3.5–5.1)
Sodium: 119 mmol/L — CL (ref 135–145)

## 2017-12-13 LAB — TROPONIN I: Troponin I: <0.03 ng/mL (ref <0.03)

## 2017-12-13 LAB — BRAIN NATRIURETIC PEPTIDE: B Natriuretic Peptide: 1751 pg/mL — ABNORMAL HIGH (ref 0.0–100.0)

## 2017-12-13 MED ORDER — SODIUM CHLORIDE 0.9% FLUSH: 3.0000 mL | INTRAVENOUS | Status: DC | PRN

## 2017-12-13 MED ORDER — ORAL CARE MOUTH RINSE
15.0000 mL | Freq: Two times a day (BID) | OROMUCOSAL | Status: DC
Start: 2017-12-13 — End: 2017-12-18
  Administered 2017-12-13 – 2017-12-16 (×5): 15 mL via OROMUCOSAL

## 2017-12-13 MED ORDER — METOLAZONE 5 MG PO TABS
5.0000 mg | ORAL_TABLET | Freq: Two times a day (BID) | ORAL | Status: DC
  Administered 2017-12-14: 5 mg via ORAL
  Filled 2017-12-13: qty 1

## 2017-12-13 MED ORDER — PSYLLIUM 95 % PO PACK
1.0000 | PACK | Freq: Every morning | ORAL | Status: DC
  Administered 2017-12-14 – 2017-12-18 (×5): 1 via ORAL
  Filled 2017-12-13 (×8): qty 1

## 2017-12-13 MED ORDER — FUROSEMIDE 10 MG/ML IJ SOLN
20.0000 mg | Freq: Two times a day (BID) | INTRAMUSCULAR | Status: AC
  Administered 2017-12-14 (×2): 20 mg via INTRAVENOUS
  Filled 2017-12-13 (×2): qty 2

## 2017-12-13 MED ORDER — ONDANSETRON HCL 4 MG/2ML IJ SOLN: 4.0000 mg | Freq: Four times a day (QID) | INTRAMUSCULAR | Status: DC | PRN

## 2017-12-13 MED ORDER — ACETAMINOPHEN 325 MG PO TABS
650.0000 mg | ORAL_TABLET | ORAL | Status: DC | PRN
  Administered 2017-12-15: 650 mg via ORAL
  Filled 2017-12-13: qty 2

## 2017-12-13 MED ORDER — FUROSEMIDE 10 MG/ML IJ SOLN
20.0000 mg | Freq: Once | INTRAMUSCULAR | Status: AC
  Administered 2017-12-13: 20 mg via INTRAVENOUS
  Filled 2017-12-13: qty 2

## 2017-12-13 MED ORDER — POTASSIUM CHLORIDE CRYS ER 20 MEQ PO TBCR
40.0000 meq | EXTENDED_RELEASE_TABLET | Freq: Three times a day (TID) | ORAL | Status: DC
  Administered 2017-12-13 – 2017-12-18 (×15): 40 meq via ORAL
  Filled 2017-12-13 (×4): qty 2
  Filled 2017-12-13: qty 4
  Filled 2017-12-13 (×10): qty 2

## 2017-12-13 MED ORDER — FERROUS SULFATE 325 (65 FE) MG PO TABS
325.0000 mg | ORAL_TABLET | Freq: Every day | ORAL | Status: DC
  Administered 2017-12-14 – 2017-12-18 (×5): 325 mg via ORAL
  Filled 2017-12-13 (×5): qty 1

## 2017-12-13 MED ORDER — SODIUM CHLORIDE 0.9 % IV SOLN: 250.0000 mL | INTRAVENOUS | Status: DC | PRN

## 2017-12-13 MED ORDER — SODIUM CHLORIDE 0.9% FLUSH
3.0000 mL | Freq: Two times a day (BID) | INTRAVENOUS | Status: DC
  Administered 2017-12-13 – 2017-12-18 (×9): 3 mL via INTRAVENOUS

## 2017-12-13 MED ORDER — ENOXAPARIN SODIUM 40 MG/0.4ML ~~LOC~~ SOLN
40.0000 mg | SUBCUTANEOUS | Status: DC
  Administered 2017-12-13 – 2017-12-17 (×5): 40 mg via SUBCUTANEOUS
  Filled 2017-12-13 (×5): qty 0.4

## 2017-12-13 NOTE — H&P (Addendum)
History and Physical  [name]:[id number] DOB: Feb 12, 1919 DOA: 12/13/2017  Referring physician: Dr Shawn Frye, ED physician PCP: Shawn Squibb, MD  Outpatient Specialists:  Shawn Frye  Patient Coming From: home  Chief Complaint: SOB  HPI: Shawn Frye is a 84 y.o. male with a history of dilated cardiomyopathy on echo 10/2017 with EF of 25%, GERD, third-degree heart block with [facility] Frye pacemaker, abdominal aortic aneurysm which is stable, symptomatic bradycardia.  Patient seen for shortness of breath over the past 7 days that is worsening.  Symptoms are worse with exertion and laying flat and improved with sitting up.  He has noted some peripheral edema.  He admits to a cough is nonproductive.  No other palliating or provoking factors.  Emergency Department Course: Patient received 20 mg of IV Lasix and was given supplemental oxygen.  BNP 1700 with a chest x-ray that shows mild vascular congestion.  His blood pressure has been between 329-518 systolically..  He has tolerated the Lasix without symptoms.  Review of Systems:    Pt denies any fevers, chills, nausea, vomiting, diarrhea, constipation, abdominal pain, wheezing, palpitations, headache, vision changes, lightheadedness, dizziness, melena, rectal bleeding.  Review of systems are otherwise negative  Past Medical History:  Diagnosis Date  . AAA (abdominal aortic aneurysm) ([place])   . Arthritis   . Chronic combined systolic (congestive) and diastolic (congestive) heart failure (Shawn Frye)    a. 10/2017: echo showing a reduced EF of 25%, diffuse HK with akinesis of the apical myocardium, Grade 2 DD, mild AI, mild to moderate MR, and mild to moderate TR  . GERD (gastroesophageal reflux disease)   . Heart block AV second degree    [employer] Shawn Frye pacemaker May 2017 - Dr. [name]  . Infection of bladder   . Joint pain   . Symptomatic bradycardia    Past Surgical History:  Procedure Laterality Date  . BACK SURGERY    . CATARACT  EXTRACTION W/ INTRAOCULAR LENS  IMPLANT, BILATERAL Bilateral   . EP IMPLANTABLE DEVICE N/A 10/02/2015   Procedure: Pacemaker Implant;  Surgeon: Evans Lance, MD;  Location: [facility];  Service: Cardiovascular;  Laterality: N/A;  . EXCISIONAL HEMORRHOIDECTOMY    . HEMORROIDECTOMY    . INGUINAL HERNIA REPAIR Bilateral 1991  . INSERT / REPLACE / REMOVE PACEMAKER    . JOINT REPLACEMENT    . [place] SURGERY  2005  . TOTAL KNEE ARTHROPLASTY Bilateral 1998 -  2003  . TOTAL SHOULDER REPLACEMENT Left 1999   Partial  . TYMPANOSTOMY TUBE PLACEMENT Right    Social History:  reports that he quit smoking about 67 years ago. His smoking use included cigarettes. He has a 15.00 pack-year smoking history. He has never used smokeless tobacco. He reports that he does not drink alcohol or use drugs. Patient lives at home  Allergies  Allergen Reactions  . Percocet [Oxycodone-Acetaminophen] Hives  . Noroxin [Norfloxacin] Nausea And Vomiting  . Celebrex [Celecoxib] Rash    Family History  Problem Relation Age of Onset  . Heart disease Father        NOT  before age 95  . Pneumonia Mother   . Cystic fibrosis Paternal Aunt   . Kidney disease Neg Hx       Prior to Admission medications   Medication Sig Start Date End Date Taking? Authorizing Provider  ferrous sulfate 325 (65 FE) MG EC tablet Take 1 tablet by mouth daily. 12/04/17   [provider]  furosemide (LASIX) 20 MG tablet Take 20 mg by mouth daily.    [provider]  metolazone (ZAROXOLYN) 2.5 MG tablet Take 1 tablet by mouth daily. For 3 days prior to Lasix (Take 30 minutes prior) 12/04/17   [provider]  metoprolol succinate (TOPROL XL) 25 MG 24 hr tablet Take 0.5 tablets (12.5 mg total) by mouth daily. 11/07/17   Strader, Fransisco Hertz, PA-C  potassium chloride (K-DUR) 10 MEQ tablet Take 10 mEq by mouth daily. 12/02/17   [provider]  psyllium (METAMUCIL SMOOTH TEXTURE) 28 % packet Take 1  packet by mouth every morning.     [provider]    Physical Exam: BP 112/88 (BP Location: Right Arm)   Pulse 87   Temp (!) 97.5 F (36.4 C) (Oral)   Resp (!) 27   Ht 5\' 7"  (1.702 m)   Wt 71.2 kg (157 lb)   SpO2 100%   BMI 24.59 kg/m   . General: Elderly Caucasian male. Awake and alert and oriented x3. No acute cardiopulmonary distress.  Marland Kitchen HEENT: Normocephalic atraumatic.  Right and left ears normal in appearance.  Pupils equal, round, reactive to light. Extraocular muscles are intact. Sclerae anicteric and noninjected.  Moist mucosal membranes. No mucosal lesions.  . Neck: Neck supple without lymphadenopathy. No carotid bruits. No masses palpated.  . Cardiovascular: Regular rate with normal S1-S2 sounds. No murmurs, rubs, gallops auscultated. No JVD.  Marland Kitchen Respiratory: Rales in bases bilaterally.  No wheezes or rhonchi.  Prolonged exhalation.  No accessory muscle use. . Abdomen: Soft, nontender, nondistended. Active bowel sounds. No masses or hepatosplenomegaly  . Skin: No rashes, lesions, or ulcerations.  Dry, warm to touch. 2+ dorsalis pedis and radial pulses. . Musculoskeletal: No calf or leg pain. All major joints not erythematous nontender.  No upper or lower joint deformation.  Good ROM.  No contractures  . Psychiatric: Intact judgment and insight. Pleasant and cooperative. . Neurologic: No focal neurological deficits. Strength is 5/5 and symmetric in upper and lower extremities.  Cranial nerves II through XII are grossly intact.           Labs on Admission: I have personally reviewed following labs and imaging studies  CBC: Recent Labs  Lab 12/13/17 1519  WBC 11.8*  NEUTROABS 9.0*  HGB 10.8*  HCT 32.3*  MCV 85.0  PLT 626   Basic Metabolic Panel: Recent Labs  Lab 12/13/17 1519  NA 119*  K 3.1*  CL 80*  CO2 29  GLUCOSE 137*  BUN 21  CREATININE 0.98  CALCIUM 8.8*   GFR: Estimated Creatinine Clearance: 41.2 mL/min (by C-G formula based on SCr of  0.98 mg/dL). Liver Function Tests: Recent Labs  Lab 12/13/17 1519  AST 27  ALT 14  ALKPHOS 100  BILITOT 2.0*  PROT 7.4  ALBUMIN 3.6   No results for input(s): LIPASE, AMYLASE in the last 168 hours. No results for input(s): AMMONIA in the last 168 hours. Coagulation Profile: No results for input(s): INR, PROTIME in the last 168 hours. Cardiac Enzymes: Recent Labs  Lab 12/13/17 1519  TROPONINI <0.03   BNP (last 3 results) No results for input(s): PROBNP in the last 8760 hours. HbA1C: No results for input(s): HGBA1C in the last 72 hours. CBG: No results for input(s): GLUCAP in the last 168 hours. Lipid Profile: No results for input(s): CHOL, HDL, LDLCALC, TRIG, CHOLHDL, LDLDIRECT in the last 72 hours. Thyroid Function Tests: No results for input(s): TSH, T4TOTAL, FREET4, T3FREE, THYROIDAB  in the last 72 hours. Anemia Panel: No results for input(s): VITAMINB12, FOLATE, FERRITIN, TIBC, IRON, RETICCTPCT in the last 72 hours. Urine analysis: No results found for: COLORURINE, APPEARANCEUR, LABSPEC, PHURINE, GLUCOSEU, HGBUR, BILIRUBINUR, KETONESUR, PROTEINUR, UROBILINOGEN, NITRITE, LEUKOCYTESUR Sepsis Labs: @LABRCNTIP (procalcitonin:4,lacticidven:4) )No results found for this or any previous visit (from the past 240 hour(s)).   Radiological Exams on Admission: Dg Chest 2 View  Result Date: 12/13/2017 CLINICAL DATA:  Shortness of breath EXAM: CHEST - 2 VIEW COMPARISON:  12/04/2017 FINDINGS: Cardiac enlargement is again seen. Pacing device is noted. Lungs are well aerated with small right pleural effusion and right basilar atelectasis stable from the prior exam. Very mild vascular congestion is noted. Postsurgical changes in left shoulder are seen. IMPRESSION: Mild vascular congestion with right basilar changes stable from the prior exam. Electronically Signed   By: Inez Catalina M.D.   On: 12/13/2017 15:31    EKG: Independently reviewed.  Ventricularly paced  rhythm  Assessment/Plan: Principal Problem:   Acute respiratory failure with hypoxia (HCC) Active Problems:   Aneurysm of abdominal vessel (HCC)   Acute on chronic systolic CHF (congestive heart failure) ([place])   Dilated cardiomyopathy ([place])   Cardiac pacemaker   Hyponatremia    This patient was discussed with the ED physician, including pertinent vitals, physical exam findings, labs, and imaging.  We also discussed care given by the ED provider.  1. Acute respiratory failure with hypoxia a. Admit to stepdown b. Supportive oxygen for symptom medic relief  2. Acute on chronic systolic heart failure with EF of 25% on last echo Telemetry monitoring Strict I/O Daily Weights Diuresis: Lasix 20 mg twice daily for now as patient has low systolic blood pressure.  Will add metolazone Potassium: 40 mEq 3 times a day by mouth Echo cardiac exam tomorrow Repeat BMP tomorrow Hold metoprolol as this may allow [place] to use more lasix 3. Dilated cardiomyopathy a. Will need ACE inhibitor if possible 4. Cardiac pacemaker a. Paced rhythm 5. Hyponatremia a. Likely secondary to fluid overload b. Fluid restrict c. We will check sodium tonight at 8 PM d. We will get urine osmolality and random sodium, although this may not be beneficial due to the patient already receiving Lasix 6. Abdominal aneurysm  a. Stable  DVT prophylaxis: Lovenox Consultants: None Code Status: Full code -this was discussed with the patient Family Communication: Friend present in the room during interview and exam Disposition Plan: Pending   Jacob Moores [facility] Pager [phone]  If 7PM-7AM, please contact night-coverage [email or web address] Password [email or web address]

## 2017-12-13 NOTE — ED Notes (Signed)
Report given to ICU nurse.

## 2017-12-13 NOTE — ED Notes (Signed)
Pt placed on 2L [place]

## 2017-12-13 NOTE — ED Provider Notes (Signed)
[facility] EMERGENCY DEPARTMENT Provider Note   CSN: [id number] Arrival date & time: 12/13/17  1424     History   Chief Complaint Chief Complaint  Patient presents with  . Shortness of Breath    HPI Shawn MAHER is a 84 y.o. male.  HPI Patient presents with 1 week of progressive shortness of breath worse when lying flat.  He has had cough but is not productive.  He has had increased swelling in bilateral lower extremities.  States he takes a medication for fluid but that it does not seem to be working.  He denies any chest pain.  No fever or chills.  Denies abdominal pain, nausea or vomiting. Past Medical History:  Diagnosis Date  . AAA (abdominal aortic aneurysm) ([place])   . Arthritis   . Chronic combined systolic (congestive) and diastolic (congestive) heart failure ([place])    a. 10/2017: echo showing a reduced EF of 25%, diffuse HK with akinesis of the apical myocardium, Grade 2 DD, mild AI, mild to moderate MR, and mild to moderate TR  . GERD (gastroesophageal reflux disease)   . Heart block AV second degree    [employer] pacemaker May 2017 - Dr. [name]  . Infection of bladder   . Joint pain   . Symptomatic bradycardia     Patient Active Problem List   Diagnosis Date Noted  . Primary osteoarthritis, right shoulder 01/08/2017  . Mobitz type 2 second degree atrioventricular block 10/02/2015  . Bradycardia 08/29/2015  . Abdominal aneurysm without mention of rupture 03/21/2011    Past Surgical History:  Procedure Laterality Date  . BACK SURGERY    . CATARACT EXTRACTION W/ INTRAOCULAR LENS  IMPLANT, BILATERAL Bilateral   . EP IMPLANTABLE DEVICE N/A 10/02/2015   Procedure: Pacemaker Implant;  Surgeon: Evans Lance, MD;  Location: [facility];  Service: Cardiovascular;  Laterality: N/A;  . EXCISIONAL HEMORRHOIDECTOMY    . HEMORROIDECTOMY    . INGUINAL HERNIA REPAIR Bilateral 1991  . INSERT / REPLACE / REMOVE PACEMAKER    . JOINT REPLACEMENT    . [place]  SURGERY  2005  . TOTAL KNEE ARTHROPLASTY Bilateral 1998 -  2003  . TOTAL SHOULDER REPLACEMENT Left 1999   Partial  . TYMPANOSTOMY TUBE PLACEMENT Right         Home Medications    Prior to Admission medications   Medication Sig Start Date End Date Taking? Authorizing Provider  ferrous sulfate 325 (65 FE) MG EC tablet Take 1 tablet by mouth daily. 12/04/17   [provider]  furosemide (LASIX) 20 MG tablet Take 20 mg by mouth daily.    [provider]  metolazone (ZAROXOLYN) 2.5 MG tablet Take 1 tablet by mouth daily. For 3 days prior to Lasix (Take 30 minutes prior) 12/04/17   [provider]  metoprolol succinate (TOPROL XL) 25 MG 24 hr tablet Take 0.5 tablets (12.5 mg total) by mouth daily. 11/07/17   Strader, Fransisco Hertz, PA-C  potassium chloride (K-DUR) 10 MEQ tablet Take 10 mEq by mouth daily. 12/02/17   [provider]  psyllium (METAMUCIL SMOOTH TEXTURE) 28 % packet Take 1 packet by mouth every morning.     [provider]    Family History Family History  Problem Relation Age of Onset  . Heart disease Father        NOT  before age 9  . Pneumonia Mother   . Cystic fibrosis Paternal Aunt   . Kidney disease  Neg Hx     Social History Social History   Tobacco Use  . Smoking status: Former Smoker    Packs/day: 1.00    Years: 15.00    Pack years: 15.00    Types: Cigarettes    Last attempt to quit: 05/13/1950    Years since quitting: 67.6  . Smokeless tobacco: Never Used  Substance Use Topics  . Alcohol use: No    Alcohol/week: 0.0 oz  . Drug use: No     Allergies   Percocet [oxycodone-acetaminophen]; Noroxin [norfloxacin]; and Celebrex [celecoxib]   Review of Systems Review of Systems  Constitutional: Negative for chills and fever.  HENT: Negative for sore throat and trouble swallowing.   Respiratory: Positive for cough and shortness of breath. Negative for chest tightness.   Cardiovascular: Positive for leg swelling.  Negative for chest pain and palpitations.  Gastrointestinal: Negative for abdominal pain, constipation, diarrhea, nausea and vomiting.  Genitourinary: Negative for dysuria and flank pain.  Musculoskeletal: Negative for back pain, myalgias, neck pain and neck stiffness.  Skin: Negative for rash and wound.  Neurological: Negative for dizziness, weakness, light-headedness, numbness and headaches.  All other systems reviewed and are negative.    Physical Exam Updated Vital Signs BP 105/80   Pulse 83   Temp (!) 97.5 F (36.4 C) (Oral)   Resp (!) 25   Ht 5\' 7"  (1.702 m)   Wt 71.2 kg (157 lb)   SpO2 100%   BMI 24.59 kg/m   Physical Exam  Constitutional: He is oriented to person, place, and time. He appears well-developed and well-nourished. No distress.  HENT:  Head: Normocephalic and atraumatic.  Mouth/Throat: Oropharynx is clear and moist. No oropharyngeal exudate.  Eyes: Pupils are equal, round, and reactive to light. EOM are normal.  Neck: Normal range of motion. Neck supple. No JVD present.  Cardiovascular: Normal rate and regular rhythm. Exam reveals no gallop and no friction rub.  No murmur heard. Pulmonary/Chest: Effort normal. He has rales.  Crackles in bilateral bases left greater than right.  Abdominal: Soft. Bowel sounds are normal. There is no tenderness. There is no rebound and no guarding.  Musculoskeletal: Normal range of motion. He exhibits edema. He exhibits no tenderness.  Mild 1+ edema bilateral lower extremities.  No asymmetry or tenderness.  Distal pulses intact.  Lymphadenopathy:    He has no cervical adenopathy.  Neurological: He is alert and oriented to person, place, and time.  Moving all extremities without focal deficit.  Sensation fully intact.  Skin: Skin is warm and dry. Capillary refill takes less than 2 seconds. No rash noted. No erythema.  Psychiatric: He has a normal mood and affect. His behavior is normal.  Nursing note and vitals  reviewed.    ED Treatments / Results  Labs (all labs ordered are listed, but only abnormal results are displayed) Labs Reviewed  CBC WITH DIFFERENTIAL/PLATELET - Abnormal; Notable for the following components:      Result Value   WBC 11.8 (*)    RBC 3.80 (*)    Hemoglobin 10.8 (*)    HCT 32.3 (*)    Neutro Abs 9.0 (*)    Monocytes Absolute 1.8 (*)    All other components within normal limits  BRAIN NATRIURETIC PEPTIDE - Abnormal; Notable for the following components:   B Natriuretic Peptide 1,751.0 (*)    All other components within normal limits  COMPREHENSIVE METABOLIC PANEL - Abnormal; Notable for the following components:   Sodium 119 (*)  Potassium 3.1 (*)    Chloride 80 (*)    Glucose, Bld 137 (*)    Calcium 8.8 (*)    Total Bilirubin 2.0 (*)    All other components within normal limits  TROPONIN I  URINALYSIS, ROUTINE W REFLEX MICROSCOPIC    EKG EKG Interpretation  Date/Time:  Saturday December 13 2017 14:35:06 EDT Ventricular Rate:  89 PR Interval:    QRS Duration: 190 QT Interval:  468 QTC Calculation: 570 R Axis:   -84 Text Interpretation:  Atrial-sensed ventricular-paced complexes No further analysis attempted due to paced rhythm Baseline wander in lead(s) III Confirmed by Julianne Rice [phone]) on 12/13/2017 4:31:56 PM   Radiology Dg Chest 2 View  Result Date: 12/13/2017 CLINICAL DATA:  Shortness of breath EXAM: CHEST - 2 VIEW COMPARISON:  12/04/2017 FINDINGS: Cardiac enlargement is again seen. Pacing device is noted. Lungs are well aerated with small right pleural effusion and right basilar atelectasis stable from the prior exam. Very mild vascular congestion is noted. Postsurgical changes in left shoulder are seen. IMPRESSION: Mild vascular congestion with right basilar changes stable from the prior exam. Electronically Signed   By: Inez Catalina M.D.   On: 12/13/2017 15:31    Procedures Procedures (including critical care time)  Medications Ordered  in ED Medications  furosemide (LASIX) injection 20 mg (20 mg Intravenous Given 12/13/17 1614)     Initial Impression / Assessment and Plan / ED Course  I have reviewed the triage vital signs and the nursing notes.  Pertinent labs & imaging results that were available during my care of the patient were reviewed by me and considered in my medical decision making (see chart for details).    Patient is clinically fluid overloaded.  He is in no respiratory distress.  Maintaining saturations on 2 L nasal cannula.  Elevated BNP and congestion on x-ray.  Patient also is hyponatremic.  Discussed with hospitalist will see patient in the emergency department and admit.   Final Clinical Impressions(s) / ED Diagnoses   Final diagnoses:  Hypervolemia, unspecified hypervolemia type  Hyponatremia    ED Discharge Orders    None       Julianne Rice, MD 12/13/17 [phone]

## 2017-12-13 NOTE — ED Notes (Signed)
Date and time results received: 12/13/17 4:05 PM    Test: sodium Critical Value: 119  Name of Provider Notified: Dr. Lita Mains  Orders Received? Or Actions Taken? None at this time.

## 2017-12-13 NOTE — ED Notes (Signed)
Pt given water per MD approval

## 2017-12-13 NOTE — ED Notes (Signed)
Called ICU to give report, nurse to call me back.

## 2017-12-13 NOTE — ED Triage Notes (Signed)
Pt reports he has been increasingly short of breath over the past few days.  Him and his wife live alone together and his wife recently fell and broke her hip and is in rehab.  States he has been alone and ems was concerned he may not be taking care of himself as he should.  Increased swelling in feet and legs, exertional dyspnea, and orthopnea per pt.

## 2017-12-14 ENCOUNTER — Inpatient Hospital Stay (HOSPITAL_COMMUNITY): Payer: Medicare Other

## 2017-12-14 DIAGNOSIS — I5043 Acute on chronic combined systolic (congestive) and diastolic (congestive) heart failure: Principal | ICD-10-CM

## 2017-12-14 DIAGNOSIS — I351 Nonrheumatic aortic (valve) insufficiency: Secondary | ICD-10-CM

## 2017-12-14 LAB — OSMOLALITY, URINE: OSMOLALITY UR: 409 mosm/kg (ref 300–900)

## 2017-12-14 LAB — BASIC METABOLIC PANEL
Anion gap: 9 (ref 5–15)
BUN: 19 mg/dL (ref 8–23)
CO2: 29 mmol/L (ref 22–32)
Calcium: 8.3 mg/dL — ABNORMAL LOW (ref 8.9–10.3)
Chloride: 83 mmol/L — ABNORMAL LOW (ref 98–111)
Creatinine, Ser: 0.78 mg/dL (ref 0.61–1.24)
GFR calc Af Amer: >60 mL/min (ref >60)
GFR calc non Af Amer: >60 mL/min (ref >60)
Glucose, Bld: 97 mg/dL (ref 70–99)
Potassium: 3.6 mmol/L (ref 3.5–5.1)
Sodium: 121 mmol/L — ABNORMAL LOW (ref 135–145)

## 2017-12-14 LAB — ECHOCARDIOGRAM COMPLETE
Height: 67 in
Weight: 2451.52 oz

## 2017-12-14 LAB — CREATININE, URINE, RANDOM: CREATININE, URINE: 59.38 mg/dL

## 2017-12-14 LAB — MRSA PCR SCREENING: MRSA BY PCR: NEGATIVE

## 2017-12-14 LAB — SODIUM, URINE, RANDOM: Sodium, Ur: 37 mmol/L

## 2017-12-14 LAB — OSMOLALITY: Osmolality: 256 mOsm/kg — ABNORMAL LOW (ref 275–295)

## 2017-12-14 MED ORDER — FUROSEMIDE 20 MG PO TABS
20.0000 mg | ORAL_TABLET | Freq: Every day | ORAL | Status: DC
  Administered 2017-12-15: 20 mg via ORAL
  Filled 2017-12-14: qty 1

## 2017-12-14 MED ORDER — TRAZODONE HCL 50 MG PO TABS
50.0000 mg | ORAL_TABLET | Freq: Once | ORAL | Status: AC
  Administered 2017-12-14: 50 mg via ORAL
  Filled 2017-12-14: qty 1

## 2017-12-14 NOTE — Progress Notes (Signed)
PROGRESS NOTE  Shawn Frye TLX:[id number] DOB: 1919/03/14 DOA: 12/13/2017 PCP: [name], MD  Brief History:  84 y.o. 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  He denies f/c, n/v/d, dysuria.  He endorses compliance with all his medications.  However, he does not cook and eats out frequently.  However, in the past week, he has had nausea which has caused decreased po intake.  Upon presentation, CXR showed vascular congestion and RLL opacity which was unchanged.  Na was 119.  He was noted to be hypoxic.  He was placed on oxygen and IV lasix with clinical improvement.   Assessment/Plan: Acute on chronic systolic and diastolic CHF -Continue IV furosemide through today -accurate I/O -daily weights -holding metoprolol succinate due to acute decompensation with low EF--concerned this may also be contributing to his dyspnea on exertion  Acute respiratory failure with hypoxia  -due to pulmonary edema -wean oxygen back to room air for saturation > 92%  Hyponatremia -due to CHF and poor solute intake -Urine osm -serum osm -urine Na -urine creatinine -continue diuresis and monitor clinically  Second Degree AVB -s/p PPM  AAA - followed by Vascular Surgery. At 5.6 cm by imaging in 06/2017.    Disposition Plan:   Home in 2-3 days  Family Communication:   Sister-in-law updated at bedside 8/4  Consultants:  cardiology  Code Status:  FULL  DVT Prophylaxis:   Lovenox   Procedures: As Listed in Progress Note Above  Antibiotics: None    Subjective: Patient states that he is breathing better today.  He denies any chest pain, nausea, vomiting, diarrhea, abdominal pain.  His appetite is much better today.  He ate nearly 100% of his breakfast.  He denies any headache or neck pain.  There is no fevers or  chills.  Objective: Vitals:   12/14/17 0600 12/14/17 0700 12/14/17 0758 12/14/17 0800  BP:  99/60 111/65 111/77  Pulse: 84 71 82 84  Resp: 20 (!) 22 (!) 29 19  Temp:    (!) 97.3 F (36.3 C)  TempSrc:    Oral  SpO2: 99% 98% 100% 99%  Weight:      Height:        Intake/Output Summary (Last 24 hours) at 12/14/2017 1233 Last data filed at 12/14/2017 0500 Gross per 24 hour  Intake -  Output 1300 ml  Net -1300 ml   Weight change:  Exam:   General:  Pt is alert, follows commands appropriately, not in acute distress  HEENT: No icterus, No thrush, No neck mass, [place]/AT  Cardiovascular: Bibasilar crackles but no wheezing.  Good air movement.  Respiratory: CTA bilaterally, no wheezing, no crackles, no rhonchi  Abdomen: Soft/+BS, non tender, non distended, no guarding  Extremities: trace LE edema, No lymphangitis, No petechiae, No rashes, no synovitis   Data Reviewed: I have personally reviewed following labs and imaging studies Basic Metabolic Panel: Recent Labs  Lab 12/13/17 1519 12/13/17 2043 12/14/17 0358  NA 119* 119* 121*  K 3.1* 3.2* 3.6  CL 80* 81* 83*  CO2 29 29 29   GLUCOSE 137* 124* 97  BUN 21 19 19   CREATININE 0.98 0.82 0.78  CALCIUM 8.8* 8.4* 8.3*   Liver Function Tests: Recent Labs  Lab 12/13/17 1519  AST 27  ALT 14  ALKPHOS 100  BILITOT 2.0*  PROT 7.4  ALBUMIN 3.6   No results for input(s): LIPASE, AMYLASE in the last 168 hours. No results for input(s): AMMONIA in the last 168 hours. Coagulation Profile: No results for input(s): INR, PROTIME in the last 168 hours. CBC: Recent Labs  Lab 12/13/17 1519  WBC 11.8*  NEUTROABS 9.0*  HGB 10.8*  HCT 32.3*  MCV 85.0  PLT 185   Cardiac Enzymes: Recent Labs  Lab 12/13/17 1519  TROPONINI <0.03   BNP: Invalid input(s): POCBNP CBG: No results for input(s): GLUCAP in the last 168 hours. HbA1C: No results for input(s): HGBA1C in the last 72 hours. Urine analysis:    Component Value  Date/Time   COLORURINE YELLOW 12/13/2017 1700   APPEARANCEUR HAZY (A) 12/13/2017 1700   LABSPEC 1.012 12/13/2017 1700   PHURINE 6.0 12/13/2017 1700   GLUCOSEU NEGATIVE 12/13/2017 1700   HGBUR MODERATE (A) 12/13/2017 1700   BILIRUBINUR NEGATIVE 12/13/2017 1700   KETONESUR NEGATIVE 12/13/2017 1700   PROTEINUR 30 (A) 12/13/2017 1700   NITRITE NEGATIVE 12/13/2017 1700   LEUKOCYTESUR LARGE (A) 12/13/2017 1700   Sepsis Labs: @LABRCNTIP (procalcitonin:4,lacticidven:4) ) Recent Results (from the past 240 hour(s))  MRSA PCR Screening     Status: None   Collection Time: 12/13/17  6:15 PM  Result Value Ref Range Status   MRSA by PCR NEGATIVE NEGATIVE Final    Comment:        The GeneXpert MRSA Assay (FDA approved for NASAL specimens only), is one component of a comprehensive MRSA colonization surveillance program. It is not intended to diagnose MRSA infection nor to guide or monitor treatment for MRSA infections. Performed at [facility], [address] [place], [place] [address]      Scheduled Meds: . enoxaparin (LOVENOX) injection  40 mg Subcutaneous Q24H  . ferrous sulfate  325 mg Oral Daily  . furosemide  20 mg Intravenous BID  . mouth rinse  15 mL Mouth Rinse BID  . metolazone  5 mg Oral BID  . potassium chloride  40 mEq Oral TID  . psyllium  1 packet Oral q morning - 10a  . sodium chloride flush  3 mL Intravenous Q12H   Continuous Infusions: . sodium chloride      Procedures/Studies: Dg Chest 2 View  Result Date: 12/13/2017 CLINICAL DATA:  Shortness of breath EXAM: CHEST - 2 VIEW COMPARISON:  12/04/2017 FINDINGS: Cardiac enlargement is again seen. Pacing device is noted. Lungs are well aerated with small right pleural effusion and right basilar atelectasis stable from the prior exam. Very mild vascular congestion is noted. Postsurgical changes in left shoulder are seen. IMPRESSION: Mild vascular congestion with right basilar changes stable from the prior exam.  Electronically Signed   By: Inez Catalina M.D.   On: 12/13/2017 15:31   Dg Chest 2 View  Result Date: 12/04/2017 CLINICAL DATA:  Shortness of breath.  Cough. EXAM: CHEST - 2 VIEW COMPARISON:  07/04/2017 FINDINGS: Left subclavian approach pacemaker remains in place with leads terminating over the right atrium and right ventricle. The cardiac silhouette is borderline to mildly enlarged. Thoracic aortic atherosclerotic calcification and tortuosity are noted. Lung volumes are lower than on the prior study with pulmonary vascular congestion and mild diffuse increase in the interstitial markings bilaterally. There are new small bilateral pleural effusions. No pneumothorax is identified. Prior left shoulder arthroplasty and severe right glenohumeral osteoarthrosis are noted. IMPRESSION: Mild interstitial edema and small bilateral pleural effusions. Electronically Signed   By: Zenia Resides  Jeralyn Ruths M.D.   On: 12/04/2017 13:47   Ct Head Wo Contrast  Result Date: 11/17/2017 CLINICAL DATA:  84 year old male with history of trauma from a fall with tear on the top of the scalp. EXAM: CT HEAD WITHOUT CONTRAST TECHNIQUE: Contiguous axial images were obtained from the base of the skull through the vertex without intravenous contrast. COMPARISON:  None. FINDINGS: Brain: Mild cerebral atrophy. Patchy and confluent areas of decreased attenuation are noted throughout the deep and periventricular white matter of the cerebral hemispheres bilaterally, compatible with chronic microvascular ischemic disease. No evidence of acute infarction, hemorrhage, hydrocephalus, extra-axial collection or mass lesion/mass effect. Vascular: No hyperdense vessel or unexpected calcification. Skull: Normal. Negative for fracture or focal lesion. Sinuses/Orbits: Diminutive and completely opacified right maxillary sinus, likely sequela of longstanding chronic sinusitis. No acute finding. Other: None. IMPRESSION: 1. No acute intracranial abnormalities. 2. Mild  cerebral atrophy with chronic microvascular ischemic changes in the cerebral white matter, as above. Electronically Signed   By: Vinnie Langton M.D.   On: 11/17/2017 21:35   Dg Swallowing Func-speech Pathology  Result Date: 11/17/2017 CLINICAL DATA:  Silent aspiration. EXAM: MODIFIED BARIUM SWALLOW TECHNIQUE: Different consistencies of barium were administered orally to the patient by the Speech Pathologist. Imaging of the pharynx was performed in the lateral projection. FLUOROSCOPY TIME:  Fluoroscopy Time:  1 minutes 48 seconds COMPARISON:  None. FINDINGS: Thin liquid-the patient had several episodes of flash laryngeal penetration. Pure- within normal limits Pure with cracker- within normal limits Barium tablet -  within normal limits IMPRESSION: Recurrent flash laryngeal penetration with thin liquids. Otherwise, normal exam. Please refer to the Speech Pathologists report for complete details and recommendations. Electronically Signed   By: Lorriane Shire M.D.   On: 11/17/2017 15:56    Orson Eva, DO  [facility] Pager [phone]  If 7PM-7AM, please contact night-coverage [email or web address] Password [email or web address] 12/14/2017, 12:33 PM   LOS: 1 day

## 2017-12-14 NOTE — Progress Notes (Signed)
*  PRELIMINARY RESULTS* Echocardiogram 2D Echocardiogram has been performed.  Samuel Germany 12/14/2017, 11:08 AM

## 2017-12-15 DIAGNOSIS — I5023 Acute on chronic systolic (congestive) heart failure: Secondary | ICD-10-CM

## 2017-12-15 LAB — BASIC METABOLIC PANEL
Anion gap: 9 (ref 5–15)
BUN: 21 mg/dL (ref 8–23)
CHLORIDE: 81 mmol/L — AB (ref 98–111)
CO2: 28 mmol/L (ref 22–32)
Calcium: 8.4 mg/dL — ABNORMAL LOW (ref 8.9–10.3)
Creatinine, Ser: 0.95 mg/dL (ref 0.61–1.24)
GFR calc Af Amer: >60 mL/min (ref >60)
GFR calc non Af Amer: >60 mL/min (ref >60)
Glucose, Bld: 112 mg/dL — ABNORMAL HIGH (ref 70–99)
Potassium: 3.6 mmol/L (ref 3.5–5.1)
Sodium: 118 mmol/L — CL (ref 135–145)

## 2017-12-15 LAB — MAGNESIUM: Magnesium: 1.7 mg/dL (ref 1.7–2.4)

## 2017-12-15 MED ORDER — DIPHENHYDRAMINE HCL 12.5 MG/5ML PO ELIX
12.5000 mg | ORAL_SOLUTION | Freq: Once | ORAL | Status: AC
  Administered 2017-12-15: 12.5 mg via ORAL
  Filled 2017-12-15: qty 5

## 2017-12-15 MED ORDER — FUROSEMIDE 10 MG/ML IJ SOLN
20.0000 mg | Freq: Once | INTRAMUSCULAR | Status: AC
  Administered 2017-12-15: 20 mg via INTRAVENOUS
  Filled 2017-12-15: qty 2

## 2017-12-15 MED ORDER — MAGNESIUM SULFATE 2 GM/50ML IV SOLN
2.0000 g | Freq: Once | INTRAVENOUS | Status: AC
  Administered 2017-12-15: 2 g via INTRAVENOUS
  Filled 2017-12-15: qty 50

## 2017-12-15 NOTE — Progress Notes (Signed)
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  He denies f/c, n/v/d, dysuria.  He endorses compliance with all his medications.  However, he does not cook and eats out frequently.  However, in the past week, he has had nausea which has caused decreased po intake.  Upon presentation, CXR showed vascular congestion and RLL opacity which was unchanged.  Na was 119.  He was noted to be hypoxic.  He was placed on oxygen and IV lasix with clinical improvement.   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 At 5.6 cm by imaging in 06/2017.    Disposition Plan:   Home in 2-3 days  Family Communication:   Sister-in-law updated at bedside 8/4  Consultants:  cardiology  Code Status:  FULL  DVT Prophylaxis:  [place] Lovenox   Procedures: As Listed in Progress Note Above  Antibiotics: None      Subjective: He is breathing better.  Patient denies fevers, chills, headache, chest  pain, dyspnea, nausea, vomiting, diarrhea, abdominal pain, dysuria, hematuria, hematochezia, and melena.   Objective: Vitals:   12/15/17 0900 12/15/17 1000 12/15/17 1100 12/15/17 1200  BP: 105/69 101/72 100/78 106/73  Pulse: 82 84 83 82  Resp: (!) 23 15 13 17   Temp:      TempSrc:      SpO2: 97% 98% 96% 97%  Weight:      Height:        Intake/Output Summary (Last 24 hours) at 12/15/2017 1412 Last data filed at 12/15/2017 0945 Gross per 24 hour  Intake 123 ml  Output 2050 ml  Net -1927 ml   Weight change: -1.715 kg (-3 lb 12.5 oz) Exam:   General:  Pt is alert, follows commands appropriately, not in acute distress  HEENT: No icterus, No thrush, No neck mass, [place]/AT  Cardiovascular: RRR, S1/S2, no rubs, no gallops  Respiratory:fine bibasilar crackles,no wheeze  Abdomen: Soft/+BS, non tender, non distended, no guarding  Extremities: trace LE edema, No lymphangitis, No petechiae, No rashes, no synovitis   Data Reviewed: I have personally reviewed following labs and imaging studies Basic Metabolic Panel: Recent Labs  Lab 12/13/17 1519 12/13/17 2043 12/14/17 0358 12/15/17 0412  NA 119* 119* 121* 118*  K 3.1* 3.2* 3.6 3.6  CL 80* 81* 83* 81*  CO2 29 29 29 28   GLUCOSE 137* 124* 97 112*  BUN 21 19 19 21   CREATININE 0.98 0.82 0.78 0.95  CALCIUM 8.8* 8.4* 8.3* 8.4*  MG  --   --   --  1.7   Liver Function Tests: Recent Labs  Lab 12/13/17 1519  AST 27  ALT 14  ALKPHOS 100  BILITOT 2.0*  PROT 7.4  ALBUMIN 3.6   No results for input(s): LIPASE, AMYLASE in the last 168 hours. No results for input(s): AMMONIA in the last 168 hours. Coagulation Profile: No results for input(s): INR, PROTIME in the last 168 hours. CBC: Recent Labs  Lab 12/13/17 1519  WBC 11.8*  NEUTROABS 9.0*  HGB 10.8*  HCT 32.3*  MCV 85.0  PLT 185   Cardiac Enzymes: Recent Labs  Lab 12/13/17 1519  TROPONINI <0.03   BNP: Invalid input(s): POCBNP CBG: No results for input(s):  GLUCAP in the last 168 hours. HbA1C: No results for input(s): HGBA1C in the last 72 hours. Urine analysis:    Component Value Date/Time   COLORURINE YELLOW 12/13/2017 1700   APPEARANCEUR HAZY (A) 12/13/2017 1700   LABSPEC 1.012 12/13/2017 1700   PHURINE 6.0 12/13/2017 1700   GLUCOSEU NEGATIVE 12/13/2017 1700   HGBUR MODERATE (A) 12/13/2017 1700   BILIRUBINUR NEGATIVE 12/13/2017 1700   KETONESUR NEGATIVE 12/13/2017 1700   PROTEINUR 30 (A) 12/13/2017 1700   NITRITE NEGATIVE 12/13/2017 1700   LEUKOCYTESUR LARGE (A) 12/13/2017 1700   Sepsis Labs: @LABRCNTIP (procalcitonin:4,lacticidven:4) ) Recent Results (from the past 240 hour(s))  MRSA PCR Screening     Status: None   Collection Time: 12/13/17  6:15 PM  Result Value Ref Range Status   MRSA by PCR NEGATIVE NEGATIVE Final    Comment:        The GeneXpert MRSA Assay (FDA approved for NASAL specimens only), is one component of a comprehensive MRSA colonization surveillance program. It is not intended to diagnose MRSA infection nor to guide or monitor treatment for MRSA infections. Performed at [facility], [address] [place], [place] [address]      Scheduled Meds: . enoxaparin (LOVENOX) injection  40 mg Subcutaneous Q24H  . ferrous sulfate  325 mg Oral Daily  . furosemide  20 mg Oral Daily  . mouth rinse  15 mL Mouth Rinse BID  . potassium chloride  40 mEq Oral TID  . psyllium  1 packet Oral q morning - 10a  . sodium chloride flush  3 mL Intravenous Q12H   Continuous Infusions: . sodium chloride      Procedures/Studies: Dg Chest 2 View  Result Date: 12/13/2017 CLINICAL DATA:  Shortness of breath EXAM: CHEST - 2 VIEW COMPARISON:  12/04/2017 FINDINGS: Cardiac enlargement is again seen. Pacing device is noted. Lungs are well aerated with small right pleural effusion and right basilar atelectasis stable from the prior exam. Very mild vascular congestion is noted. Postsurgical changes in left shoulder are seen.  IMPRESSION: Mild vascular congestion with right basilar changes stable from the prior exam. Electronically Signed   By: Inez Catalina M.D.   On: 12/13/2017 15:31   Dg Chest 2 View  Result Date: 12/04/2017 CLINICAL DATA:  Shortness of breath.  Cough. EXAM: CHEST - 2 VIEW COMPARISON:  07/04/2017 FINDINGS: Left subclavian approach pacemaker remains in place with leads terminating over the right atrium and right ventricle. The cardiac silhouette is borderline to mildly enlarged. Thoracic aortic atherosclerotic calcification and tortuosity are noted. Lung volumes are lower than on the prior study with pulmonary vascular congestion and mild diffuse increase in the interstitial markings bilaterally. There are new small bilateral pleural effusions. No pneumothorax is identified. Prior left shoulder arthroplasty and severe right glenohumeral osteoarthrosis are noted. IMPRESSION: Mild interstitial edema  and small bilateral pleural effusions. Electronically Signed   By: Logan Bores M.D.   On: 12/04/2017 13:47   Ct Head Wo Contrast  Result Date: 11/17/2017 CLINICAL DATA:  84 year old male with history of trauma from a fall with tear on the top of the scalp. EXAM: CT HEAD WITHOUT CONTRAST TECHNIQUE: Contiguous axial images were obtained from the base of the skull through the vertex without intravenous contrast. COMPARISON:  None. FINDINGS: Brain: Mild cerebral atrophy. Patchy and confluent areas of decreased attenuation are noted throughout the deep and periventricular white matter of the cerebral hemispheres bilaterally, compatible with chronic microvascular ischemic disease. No evidence of acute infarction, hemorrhage, hydrocephalus, extra-axial collection or mass lesion/mass effect. Vascular: No hyperdense vessel or unexpected calcification. Skull: Normal. Negative for fracture or focal lesion. Sinuses/Orbits: Diminutive and completely opacified right maxillary sinus, likely sequela of longstanding chronic sinusitis.  No acute finding. Other: None. IMPRESSION: 1. No acute intracranial abnormalities. 2. Mild cerebral atrophy with chronic microvascular ischemic changes in the cerebral white matter, as above. Electronically Signed   By: Vinnie Langton M.D.   On: 11/17/2017 21:35   Dg Swallowing Func-speech Pathology  Result Date: 11/17/2017 CLINICAL DATA:  Silent aspiration. EXAM: MODIFIED BARIUM SWALLOW TECHNIQUE: Different consistencies of barium were administered orally to the patient by the Speech Pathologist. Imaging of the pharynx was performed in the lateral projection. FLUOROSCOPY TIME:  Fluoroscopy Time:  1 minutes 48 seconds COMPARISON:  None. FINDINGS: Thin liquid-the patient had several episodes of flash laryngeal penetration. Pure- within normal limits Pure with cracker- within normal limits Barium tablet -  within normal limits IMPRESSION: Recurrent flash laryngeal penetration with thin liquids. Otherwise, normal exam. Please refer to the Speech Pathologists report for complete details and recommendations. Electronically Signed   By: Lorriane Shire M.D.   On: 11/17/2017 15:56    Orson Eva, DO  [facility] Pager [phone]  If 7PM-7AM, please contact night-coverage [email or web address] Password [email or web address] 12/15/2017, 2:12 PM   LOS: 2 days

## 2017-12-15 NOTE — Consult Note (Addendum)
Cardiology Consultation:   Patient ID: NIXON KOLTON; [id number]; 1918-10-29   Admit date: 12/13/2017 Date of Consult: 12/15/2017  Primary Care Provider: [name], MD Primary Cardiologist: Rozann Lesches, MD  Primary Electrophysiologist: Dr. [name]   Patient Profile:   SYLIS KETCHUM is a 84 y.o. male with a hx of pacemaker for sinus bradycardia who is being seen today for the evaluation of CHF at the request of Dr. Carles Collet.  History of Present Illness:   Mr. Siefert is a 84 year old male patient who has a history of a St. Jude PPM in 2017 for symptomatic bradycardia, AAA-5.6 cm 06/2017, GERD who was just diagnosed with a cardiomyopathy 09/2017 and saw Dr. Domenic Polite 10/10/17.  2D echo 10/17/2017 EF 25% with diffuse hypokinesis with hypokinesis of the apical myocardium, grade 2 DD and mild to moderate MR.  He last saw [place], PA-C 11/07/2017 to titrate medical management as ischemic evaluation was not to be pursued given advanced age.  BP was soft that day.  She added Toprol-XL 12.5 mg daily in addition to Lasix 20 mg that he was already taking.  He was euvolemic that day.  Patient had an accidental fall while with visiting his wife in the hospital 11/17/2017 and hitting his left scalp but did not require suturing.  CT of the head was negative.  Patient now presents with 7-day history of worsening dyspnea on exertion and when laying down.  BNP was 1700 and was given IV Lasix 20 mg in the emergency room.  Has diuresed 3 L since he has been here.  Tells me he is just weak and having trouble walking, no appetite.  His wife is in the [facility].  He has been eating [place] fried chicken almost daily.  And eats out every day.  Is still driving.  Past Medical History:  Diagnosis Date  . AAA (abdominal aortic aneurysm) ([place])   . Arthritis   . Chronic combined systolic (congestive) and diastolic (congestive) heart failure ([place])    a. 10/2017: echo showing a reduced EF of 25%, diffuse HK with akinesis  of the apical myocardium, Grade 2 DD, mild AI, mild to moderate MR, and mild to moderate TR  . GERD (gastroesophageal reflux disease)   . Heart block AV second degree    St. Jude pacemaker May 2017 - Dr. [name]  . Infection of bladder   . Joint pain   . Symptomatic bradycardia     Past Surgical History:  Procedure Laterality Date  . BACK SURGERY    . CATARACT EXTRACTION W/ INTRAOCULAR LENS  IMPLANT, BILATERAL Bilateral   . EP IMPLANTABLE DEVICE N/A 10/02/2015   Procedure: Pacemaker Implant;  Surgeon: Evans Lance, MD;  Location: [facility];  Service: Cardiovascular;  Laterality: N/A;  . EXCISIONAL HEMORRHOIDECTOMY    . HEMORROIDECTOMY    . INGUINAL HERNIA REPAIR Bilateral 1991  . INSERT / REPLACE / REMOVE PACEMAKER    . JOINT REPLACEMENT    . [place] SURGERY  2005  . TOTAL KNEE ARTHROPLASTY Bilateral 1998 -  2003  . TOTAL SHOULDER REPLACEMENT Left 1999   Partial  . TYMPANOSTOMY TUBE PLACEMENT Right      Home Medications:  Prior to Admission medications   Medication Sig Start Date End Date Taking? Authorizing Provider  ALPRAZolam (XANAX) 0.25 MG tablet Take 0.125-0.25 mg by mouth daily as needed for anxiety.   Yes [provider]  ferrous sulfate 325 (65 FE) MG EC tablet Take 1 tablet  by mouth daily. 12/04/17  Yes [provider]  furosemide (LASIX) 20 MG tablet Take 20 mg by mouth daily.   Yes [provider]  metolazone (ZAROXOLYN) 2.5 MG tablet Take 1 tablet by mouth daily. For 3 days prior to Lasix (Take 30 minutes prior) 12/04/17  Yes [provider]  metoprolol succinate (TOPROL XL) 25 MG 24 hr tablet Take 0.5 tablets (12.5 mg total) by mouth daily. 11/07/17  Yes Strader, [place] M, PA-C  potassium chloride (K-DUR) 10 MEQ tablet Take 10 mEq by mouth daily. 12/02/17  Yes [provider]  psyllium (METAMUCIL SMOOTH TEXTURE) 28 % packet Take 1 packet by mouth every morning.    Yes [provider]  ranitidine  (ZANTAC) 150 MG tablet Take 150 mg by mouth at bedtime.   Yes [provider]    Inpatient Medications: Scheduled Meds: . enoxaparin (LOVENOX) injection  40 mg Subcutaneous Q24H  . ferrous sulfate  325 mg Oral Daily  . furosemide  20 mg Oral Daily  . mouth rinse  15 mL Mouth Rinse BID  . potassium chloride  40 mEq Oral TID  . psyllium  1 packet Oral q morning - 10a  . sodium chloride flush  3 mL Intravenous Q12H   Continuous Infusions: . sodium chloride    . magnesium sulfate 1 - 4 g bolus IVPB 2 g (12/15/17 1440)   PRN Meds: sodium chloride, acetaminophen, ondansetron (ZOFRAN) IV, sodium chloride flush  Allergies:    Allergies  Allergen Reactions  . Percocet [Oxycodone-Acetaminophen] Hives  . Noroxin [Norfloxacin] Nausea And Vomiting  . Celebrex [Celecoxib] Rash    Social History:   Social History   Socioeconomic History  . Marital status: Married    Spouse name: Not on file  . Number of children: Not on file  . Years of education: Not on file  . Highest education level: Not on file  Occupational History  . Not on file  Social Needs  . Financial resource strain: Not on file  . Food insecurity:    Worry: Not on file    Inability: Not on file  . Transportation needs:    Medical: Not on file    Non-medical: Not on file  Tobacco Use  . Smoking status: Former Smoker    Packs/day: 1.00    Years: 15.00    Pack years: 15.00    Types: Cigarettes    Last attempt to quit: 05/13/1950    Years since quitting: 67.6  . Smokeless tobacco: Never Used  Substance and Sexual Activity  . Alcohol use: No    Alcohol/week: 0.0 oz  . Drug use: No  . Sexual activity: Never  Lifestyle  . Physical activity:    Days per week: Not on file    Minutes per session: Not on file  . Stress: Not on file  Relationships  . Social connections:    Talks on phone: Not on file    Gets together: Not on file    Attends religious service: Not on file    Active member of club or  organization: Not on file    Attends meetings of clubs or organizations: Not on file    Relationship status: Not on file  . Intimate partner violence:    Fear of current or ex partner: Not on file    Emotionally abused: Not on file    Physically abused: Not on file    Forced sexual activity: Not on file  Other Topics Concern  .  Not on file  Social History Narrative  . Not on file    Family History:    Family History  Problem Relation Age of Onset  . Heart disease Father        NOT  before age 11  . Pneumonia Mother   . Cystic fibrosis Paternal Aunt   . Kidney disease Neg Hx      ROS:  Please see the history of present illness.  Review of Systems  Constitution: Positive for decreased appetite and malaise/fatigue.  HENT: Positive for hearing loss.   Cardiovascular: Positive for dyspnea on exertion and orthopnea.  Respiratory: Negative.   Endocrine: Negative.   Hematologic/Lymphatic: Negative.   Musculoskeletal: Positive for falls.  Gastrointestinal: Negative.   Genitourinary: Negative.   Neurological: Positive for loss of balance and weakness.    All other ROS reviewed and negative.     Physical Exam/Data:   Vitals:   12/15/17 0900 12/15/17 1000 12/15/17 1100 12/15/17 1200  BP: 105/69 101/72 100/78 106/73  Pulse: 82 84 83 82  Resp: (!) 23 15 13 17   Temp:      TempSrc:      SpO2: 97% 98% 96% 97%  Weight:      Height:        Intake/Output Summary (Last 24 hours) at 12/15/2017 1447 Last data filed at 12/15/2017 0945 Gross per 24 hour  Intake 123 ml  Output 2050 ml  Net -1927 ml   Filed Weights   12/13/17 1437 12/13/17 1819 12/15/17 0500  Weight: 157 lb (71.2 kg) 153 lb 3.5 oz (69.5 kg) 153 lb 3.5 oz (69.5 kg)   Body mass index is 24 kg/m.  General: Thin, elderly, in no acute distress  HEENT: Poor dentition with upper dentures otherwise normal Lymph: no adenopathy Neck: Increased JVD Endocrine:  No thryomegaly Vascular: No carotid bruits; FA pulses 2+  bilaterally without bruits  Cardiac:  normal S1, S2; RRR; 2/6 systolic murmur at the left sternal border and 1/6 diastolic murmur Lungs: Decreased breath sounds with fine crackles at the bases abd: soft, nontender, no hepatomegaly  Ext: no edema Musculoskeletal:  No deformities, BUE and BLE strength normal and equal Skin: warm and dry  Neuro:  CNs 2-12 intact, no focal abnormalities noted Psych:  Normal affect   EKG:  The EKG was personally reviewed and demonstrates: Atrial sensed ventricular paced Telemetry:  Telemetry was personally reviewed and demonstrates: Atrial sensed ventricular paced with some PVCs  Relevant CV Studies:  2D echo 8/4/2019Study Conclusions   - Left ventricle: The cavity size was mildly dilated. Wall   thickness was normal. Systolic function was severely reduced. The   estimated ejection fraction was 20%. Diffuse hypokinesis.   Features are consistent with a pseudonormal left ventricular   filling pattern, with concomitant abnormal relaxation and   increased filling pressure (grade 2 diastolic dysfunction).   Doppler parameters are consistent with high ventricular filling   pressure. - Regional wall motion abnormality: Akinesis of the apical   myocardium. - Ventricular septum: Septal motion showed abnormal function and   dyssynergy. - Aortic valve: Moderately calcified annulus. Trileaflet. There was   mild regurgitation. - Aorta: Mild aortic root enlargement. - Mitral valve: Calcified annulus. There was moderate   regurgitation. - Left atrium: The atrium was severely dilated. - Right ventricle: Pacer wire or catheter noted in right ventricle.   Systolic function was mildly to moderately reduced. - Right atrium: The atrium was moderately dilated. - Tricuspid valve: There was moderate-severe  regurgitation. - Pulmonic valve: There was mild regurgitation. - Pulmonary arteries: PA peak pressure: 56 mm Hg (S). - Inferior vena cava: The vessel was dilated. The  respirophasic   diameter changes were blunted (< 50%), consistent with elevated   central venous pressure.     Echocardiogram: 10/2017 Study Conclusions   - Left ventricle: The cavity size was moderately dilated. Wall   thickness was normal. Systolic function was severely reduced. The   estimated ejection fraction was 25%. Diffuse hypokinesis. There   is dyskinesis of the anteroseptal myocardium. There is akinesis   of the apical myocardium. Features are consistent with a   pseudonormal left ventricular filling pattern, with concomitant   abnormal relaxation and increased filling pressure (grade 2   diastolic dysfunction). - Ventricular septum: Septal motion showed abnormal function and   dyssynergy. - Aortic valve: Mildly calcified annulus. Trileaflet; mildly   calcified leaflets. There was mild regurgitation. - Mitral valve: Mildly calcified annulus. There was mild to   moderate regurgitation. - Left atrium: The atrium was moderately to severely dilated. - Right ventricle: Pacer wire or catheter noted in right ventricle. - Tricuspid valve: There was mild-moderate regurgitation. - Pulmonic valve: There was mild regurgitation. - Pulmonary arteries: PA peak pressure: 53 mm Hg (S). - Pericardium, extracardiac: There was no pericardial effusion.    Laboratory Data:  Chemistry Recent Labs  Lab 12/13/17 2043 12/14/17 0358 12/15/17 0412  NA 119* 121* 118*  K 3.2* 3.6 3.6  CL 81* 83* 81*  CO2 29 29 28   GLUCOSE 124* 97 112*  BUN 19 19 21   CREATININE 0.82 0.78 0.95  CALCIUM 8.4* 8.3* 8.4*  GFRNONAA >60 >60 >60  GFRAA >60 >60 >60  ANIONGAP 9 9 9     Recent Labs  Lab 12/13/17 1519  PROT 7.4  ALBUMIN 3.6  AST 27  ALT 14  ALKPHOS 100  BILITOT 2.0*   Hematology Recent Labs  Lab 12/13/17 1519  WBC 11.8*  RBC 3.80*  HGB 10.8*  HCT 32.3*  MCV 85.0  MCH 28.4  MCHC 33.4  RDW 14.6  PLT 185   Cardiac Enzymes Recent Labs  Lab 12/13/17 1519  TROPONINI <0.03    No results for input(s): TROPIPOC in the last 168 hours.  BNP Recent Labs  Lab 12/13/17 1519  BNP 1,751.0*    DDimer No results for input(s): DDIMER in the last 168 hours.  Radiology/Studies:  Dg Chest 2 View  Result Date: 12/13/2017 CLINICAL DATA:  Shortness of breath EXAM: CHEST - 2 VIEW COMPARISON:  12/04/2017 FINDINGS: Cardiac enlargement is again seen. Pacing device is noted. Lungs are well aerated with small right pleural effusion and right basilar atelectasis stable from the prior exam. Very mild vascular congestion is noted. Postsurgical changes in left shoulder are seen. IMPRESSION: Mild vascular congestion with right basilar changes stable from the prior exam. Electronically Signed   By: Inez Catalina M.D.   On: 12/13/2017 15:31    Assessment and Plan:   1. Acute on chronic combined systolic and diastolic CHF ejection fraction 25% on echo 10/2017, repeat echo 12/14/2017 LVEF 20% with diffuse hypokinesis and grade 2 DD.  Received Lasix 20 mg IV in the emergency room-has diuresed 3.2 L since admission-still mild fluid on board but getting close to baseline.  Weight 153 pounds today was 151 in the office 11/07/2017.  Marland Kitchen  Consider Coreg 3.125 mg twice daily.  2 g sodium diet. 2. New cardiomyopathy ejection fraction 25% plan is for medical therapy  given his age.  Limited because of low blood pressures-consider low-dose Coreg 3. St. Jude pacemaker in 2017 for symptomatic bradycardia 4. Abdominal aortic aneurysm 5.6 cm 06/2017   For questions or updates, please contact [place] Please consult [email or web address] for contact info under Cardiology/STEMI.   Signed, Ermalinda Barrios, PA-C  12/15/2017 2:47 PM   Patient seen and discussed with PA Bonnell Public, I agree with her documentation. 84 yo male history of bradycardia with pacemaker, GERD, AAA, chronic systolic HF LVE 02%. From last cardiology clinic note not pursuing cath due to advanced age. Admitted with SOB and LE edema.    Admit labs WBC  11.8 Hgb 10.8 Plt 185 BNP 1751 Na 119 K 3.1 Cr 0.98  Trop neg CXR mild vascular congesiton EKG a sensed, v-paced 12/2017 echo LVE 20%, grade II diastolic dysfunction, mod MR, mild to mod RV dysfunction, PASP 56  Patient admitted with acute on chronic systolic HF. Medical therapy limited by soft bp's. Negative 3.2 L since admission, mild fluctuations of Cr. Na remains low. Of note he has metolazone on his home med list, would avoid this medication at this time.   He remains fluid overloaded by exam. Elevated JVD, crackles lung fields. No significant LE edema. Would continue IV diuresis. Redose IV lasix 20mg  tonight, reevaluate dosing tomorrow. Hold any beta blocker or ACE/ARB/ARNI due to soft bp's.   Carlyle Dolly MD

## 2017-12-15 NOTE — Progress Notes (Signed)
Nutrition Brief Note  Patient identified on the Malnutrition Screening Tool (MST) Report  Patient has experienced a recent decrease in appetite. Today reports that he is feeling better and nursing affirms good breakfast intake >50% of meal consumed. Patient is HOH and difficult with providing details of usual intake other than he has been eating out daily for convenience.   Wt Readings from Last 15 Encounters:  12/15/17 153 lb 3.5 oz (69.5 kg)  11/17/17 151 lb (68.5 kg)  11/07/17 151 lb (68.5 kg)  10/29/17 145 lb 11.2 oz (66.1 kg)  10/10/17 149 lb 12.8 oz (67.9 kg)  07/01/17 151 lb 4.8 oz (68.6 kg)  04/24/17 150 lb 6.4 oz (68.2 kg)  12/24/16 149 lb 6.4 oz (67.8 kg)  11/08/16 152 lb (68.9 kg)  10/31/16 152 lb (68.9 kg)  10/23/16 153 lb (69.4 kg)  10/16/16 155 lb (70.3 kg)  09/25/16 152 lb 4.8 oz (69.1 kg)  06/25/16 147 lb 12.8 oz (67 kg)  04/10/16 155 lb (70.3 kg)    Body mass index is 24 kg/m. Patient meets criteria for normal based on current BMI.   Current diet order is heart healthy with a 1500 ml fluid restriction, patient is consuming approximately >50% of meals at this time. Labs and medications reviewed.   No nutrition interventions warranted at this time. If nutrition issues arise, please consult RD.   Colman Cater MS,RD,CSG,LDN Office: [phone] Pager: [phone]

## 2017-12-16 LAB — BASIC METABOLIC PANEL
Anion gap: 9 (ref 5–15)
BUN: 19 mg/dL (ref 8–23)
CALCIUM: 8.4 mg/dL — AB (ref 8.9–10.3)
CO2: 27 mmol/L (ref 22–32)
CREATININE: 0.83 mg/dL (ref 0.61–1.24)
Chloride: 83 mmol/L — ABNORMAL LOW (ref 98–111)
GLUCOSE: 109 mg/dL — AB (ref 70–99)
Potassium: 3.9 mmol/L (ref 3.5–5.1)
Sodium: 119 mmol/L — CL (ref 135–145)

## 2017-12-16 LAB — MAGNESIUM: Magnesium: 1.9 mg/dL (ref 1.7–2.4)

## 2017-12-16 MED ORDER — POLYETHYLENE GLYCOL 3350 17 G PO PACK
17.0000 g | PACK | Freq: Every day | ORAL | Status: DC
  Administered 2017-12-16 – 2017-12-18 (×3): 17 g via ORAL
  Filled 2017-12-16 (×3): qty 1

## 2017-12-16 MED ORDER — TRAZODONE HCL 50 MG PO TABS
25.0000 mg | ORAL_TABLET | Freq: Every evening | ORAL | Status: DC | PRN
  Administered 2017-12-16 – 2017-12-17 (×2): 25 mg via ORAL
  Filled 2017-12-16 (×2): qty 1

## 2017-12-16 MED ORDER — FUROSEMIDE 10 MG/ML IJ SOLN
40.0000 mg | Freq: Two times a day (BID) | INTRAMUSCULAR | Status: AC
  Administered 2017-12-16 (×2): 40 mg via INTRAVENOUS
  Filled 2017-12-16 (×2): qty 4

## 2017-12-16 MED ORDER — SENNA 8.6 MG PO TABS
2.0000 | ORAL_TABLET | Freq: Every day | ORAL | Status: DC
  Administered 2017-12-16 – 2017-12-18 (×3): 17.2 mg via ORAL
  Filled 2017-12-16 (×3): qty 2

## 2017-12-16 NOTE — Care Management Note (Signed)
Case Management Note  Patient Details  Name: Shawn Frye MRN: [id number] Date of Birth: 1919-02-10  Subjective/Objective:   CHF. From home. Wife is currently at [facility].  Patient is independent. Has PCP -Dr. Nevada Crane. Patient still drives. Reports eating a lot of fast food recently.                 Action/Plan: CM following for needs. Will need a PT eval closer to DC.    Expected Discharge Date:                  Expected Discharge Plan:     In-House Referral:     Discharge planning Services  CM Consult  Post Acute Care Choice:    Choice offered to:     DME Arranged:    DME Agency:     HH Arranged:    HH Agency:     Status of Service:  In process, will continue to follow  If discussed at Long Length of Stay Meetings, dates discussed:    Additional Comments:  Artie Takayama, Chauncey Reading, RN 12/16/2017, 2:53 PM

## 2017-12-16 NOTE — Progress Notes (Signed)
Progress Note  Patient Name: Shawn Frye Date of Encounter: 12/16/2017  Primary Cardiologist: Rozann Lesches, MD   Subjective   Stable SOB  Inpatient Medications    Scheduled Meds: . enoxaparin (LOVENOX) injection  40 mg Subcutaneous Q24H  . ferrous sulfate  325 mg Oral Daily  . mouth rinse  15 mL Mouth Rinse BID  . potassium chloride  40 mEq Oral TID  . psyllium  1 packet Oral q morning - 10a  . sodium chloride flush  3 mL Intravenous Q12H   Continuous Infusions: . sodium chloride     PRN Meds: sodium chloride, acetaminophen, ondansetron (ZOFRAN) IV, sodium chloride flush   Vital Signs    Vitals:   12/16/17 0200 12/16/17 0300 12/16/17 0400 12/16/17 0500  BP: 93/72 94/79    Pulse: 81 (!) 50    Resp: 17 14    Temp:   97.8 F (36.6 C)   TempSrc:   Oral   SpO2: 94% 96%    Weight:    154 lb 12.2 oz (70.2 kg)  Height:        Intake/Output Summary (Last 24 hours) at 12/16/2017 0813 Last data filed at 12/16/2017 0100 Gross per 24 hour  Intake 537.17 ml  Output 450 ml  Net 87.17 ml   Filed Weights   12/13/17 1819 12/15/17 0500 12/16/17 0500  Weight: 153 lb 3.5 oz (69.5 kg) 153 lb 3.5 oz (69.5 kg) 154 lb 12.2 oz (70.2 kg)    Telemetry    A sensed, V paced - Personally Reviewed  ECG    na  Physical Exam   GEN: No acute distress.   Neck: JVD to ear lobe Cardiac: RRR, 2/6 systoilc murmur at apex. Respiratory:mild bilateral crackles GI: Soft, nontender, non-distended  MS: No edema; No deformity. Neuro:  Nonfocal  Psych: Normal affect   Labs    Chemistry Recent Labs  Lab 12/13/17 1519  12/14/17 0358 12/15/17 0412 12/16/17 0418  NA 119*   < > 121* 118* 119*  K 3.1*   < > 3.6 3.6 3.9  CL 80*   < > 83* 81* 83*  CO2 29   < > 29 28 27   GLUCOSE 137*   < > 97 112* 109*  BUN 21   < > 19 21 19   CREATININE 0.98   < > 0.78 0.95 0.83  CALCIUM 8.8*   < > 8.3* 8.4* 8.4*  PROT 7.4  --   --   --   --   ALBUMIN 3.6  --   --   --   --   AST 27  --   --   --    --   ALT 14  --   --   --   --   ALKPHOS 100  --   --   --   --   BILITOT 2.0*  --   --   --   --   GFRNONAA >60   < > >60 >60 >60  GFRAA >60   < > >60 >60 >60  ANIONGAP 10   < > 9 9 9    < > = values in this interval not displayed.     Hematology Recent Labs  Lab 12/13/17 1519  WBC 11.8*  RBC 3.80*  HGB 10.8*  HCT 32.3*  MCV 85.0  MCH 28.4  MCHC 33.4  RDW 14.6  PLT 185    Cardiac Enzymes Recent Labs  Lab 12/13/17 1519  TROPONINI <  0.03   No results for input(s): TROPIPOC in the last 168 hours.   BNP Recent Labs  Lab 12/13/17 1519  BNP 1,751.0*     DDimer No results for input(s): DDIMER in the last 168 hours.   Radiology    No results found.  Cardiac Studies    Patient Profile     Shawn Frye is a 84 y.o. male with a hx of pacemaker for sinus bradycardia who is being seen today for the evaluation of CHF at the request of Dr. Carles Collet.    Assessment & Plan    1. Acute on chronic systolic heart failure - 12/8755 echo LVEF 20%, grade II diastoilc dysfunction. Moderate MR, mod to severe TR,  PASP 56 - admitted with volume overload. I/Os incomplete, uop not documented by nightshift last night. Downtrend in Cr and BUN with IV lasix. He received lasix 20mg  oral in AM and 20mg  IV dose in PM.  - Medical therapy limited by soft bp's. No beta blocker or ACE/ARB/ARNI/aldactone at this time.  - from clinic note no plans to pursue cath due to advanced age.   -remains volume overloaded by exam. JVD to earlobe, some crackles on exam. ONgoing SOB. Dose IV lasix 40mg  bid today and reassess volume status tomorrow.    2. Hyponateremia - hypervolemic hyponatremia. Na stable from yesterday. May improve with diuresis.    For questions or updates, please contact [place] Please consult [email or web address] for contact info under Cardiology/STEMI.      Merrily Pew, MD  12/16/2017, 8:13 AM

## 2017-12-16 NOTE — Progress Notes (Signed)
PROGRESS NOTE  ESGAR BARNICK BJY:[id number] DOB: November 04, 1918 DOA: 12/13/2017 PCP: [name], MD  Brief History: 84 y.o.malewith past medical history of symptomatic bradycardia (s/p St. Jude PPM placement in 2017), GERD, and AAA who presentswith one week hx of worsening sob and LE edema and nonproductive cough. He denies f/c, n/v/d, dysuria. He endorses compliance with all his medications. However, he does not cook and eats out frequently. However, in the past week, he has had nausea which has caused decreased po intake. Upon presentation, CXR showed vascular congestion and RLL opacity which was unchanged. Na was 119. He was noted to be hypoxic. He was placed on oxygen and IV lasix with clinical improvement.   Assessment/Plan: Acute on chronic systolic and diastolic CHF -appears to have biventricular failure -accurate I/O--incomplete -holding metoprolol succinate due to acute decompensation with low EF--concerned this may also be contributing to his dyspnea on exertion -appreciate cardiology follow up -continue IV lasix -12/14/17 Echo--EF 20%, diffuse HK, grade 2 DD, mod to severe TR  Acute respiratory failure with hypoxia  -due to pulmonary edema -wean oxygen back to room air for saturation > 92%  Hyponatremia -due to CHF and poor solute intake -Urine osm and serum osm suggests mild SIADH likely from acute illness -FeNa = 0.5%, but also on lasix -continue diuresis and monitor clinically  Second Degree AVB -s/p PPM  AAA - followed by Vascular Surgery. At 5.6 cm by imaging in 06/2017.    Disposition Plan: Home in 2-3days  Family Communication:No family present  Consultants:cardiology  Code Status: FULL  DVT Prophylaxis: [place] Lovenox   Procedures: As Listed in Progress Note Above  Antibiotics: None    Subjective: Patient denies fevers, chills, headache, chest pain, dyspnea, nausea, vomiting, diarrhea, abdominal pain,  dysuria, hematuria, hematochezia, and melena.   Objective: Vitals:   12/16/17 0900 12/16/17 1000 12/16/17 1100 12/16/17 1300  BP: 119/76 110/78 98/73   Pulse: 89 94 91   Resp: 16 (!) 21 18   Temp:    98.1 F (36.7 C)  TempSrc:    Oral  SpO2: 97% 94% 97%   Weight:      Height:        Intake/Output Summary (Last 24 hours) at 12/16/2017 1550 Last data filed at 12/16/2017 0932 Gross per 24 hour  Intake 537.17 ml  Output 650 ml  Net -112.83 ml   Weight change: 0.7 kg (1 lb 8.7 oz) Exam:   General:  Pt is alert, follows commands appropriately, not in acute distress  HEENT: No icterus, No thrush, No neck mass, [place]/AT  Cardiovascular: RRR, S1/S2, no rubs, no gallops  Respiratory: bibasilar rales, no wheeze  Abdomen: Soft/+BS, non tender, non distended, no guarding  Extremities: trace LE edema, No lymphangitis, No petechiae, No rashes, no synovitis   Data Reviewed: I have personally reviewed following labs and imaging studies Basic Metabolic Panel: Recent Labs  Lab 12/13/17 1519 12/13/17 2043 12/14/17 0358 12/15/17 0412 12/16/17 0418  NA 119* 119* 121* 118* 119*  K 3.1* 3.2* 3.6 3.6 3.9  CL 80* 81* 83* 81* 83*  CO2 29 29 29 28 27   GLUCOSE 137* 124* 97 112* 109*  BUN 21 19 19 21 19   CREATININE 0.98 0.82 0.78 0.95 0.83  CALCIUM 8.8* 8.4* 8.3* 8.4* 8.4*  MG  --   --   --  1.7 1.9   Liver Function Tests: Recent Labs  Lab 12/13/17 1519  AST 27  ALT 14  ALKPHOS 100  BILITOT 2.0*  PROT 7.4  ALBUMIN 3.6   No results for input(s): LIPASE, AMYLASE in the last 168 hours. No results for input(s): AMMONIA in the last 168 hours. Coagulation Profile: No results for input(s): INR, PROTIME in the last 168 hours. CBC: Recent Labs  Lab 12/13/17 1519  WBC 11.8*  NEUTROABS 9.0*  HGB 10.8*  HCT 32.3*  MCV 85.0  PLT 185   Cardiac Enzymes: Recent Labs  Lab 12/13/17 1519  TROPONINI <0.03   BNP: Invalid input(s): POCBNP CBG: No results for input(s): GLUCAP in  the last 168 hours. HbA1C: No results for input(s): HGBA1C in the last 72 hours. Urine analysis:    Component Value Date/Time   COLORURINE YELLOW 12/13/2017 1700   APPEARANCEUR HAZY (A) 12/13/2017 1700   LABSPEC 1.012 12/13/2017 1700   PHURINE 6.0 12/13/2017 1700   GLUCOSEU NEGATIVE 12/13/2017 1700   HGBUR MODERATE (A) 12/13/2017 1700   BILIRUBINUR NEGATIVE 12/13/2017 1700   KETONESUR NEGATIVE 12/13/2017 1700   PROTEINUR 30 (A) 12/13/2017 1700   NITRITE NEGATIVE 12/13/2017 1700   LEUKOCYTESUR LARGE (A) 12/13/2017 1700   Sepsis Labs: @LABRCNTIP (procalcitonin:4,lacticidven:4) ) Recent Results (from the past 240 hour(s))  MRSA PCR Screening     Status: None   Collection Time: 12/13/17  6:15 PM  Result Value Ref Range Status   MRSA by PCR NEGATIVE NEGATIVE Final    Comment:        The GeneXpert MRSA Assay (FDA approved for NASAL specimens only), is one component of a comprehensive MRSA colonization surveillance program. It is not intended to diagnose MRSA infection nor to guide or monitor treatment for MRSA infections. Performed at [facility], [address] [place], [place] [address]      Scheduled Meds: . enoxaparin (LOVENOX) injection  40 mg Subcutaneous Q24H  . ferrous sulfate  325 mg Oral Daily  . furosemide  40 mg Intravenous BID  . mouth rinse  15 mL Mouth Rinse BID  . potassium chloride  40 mEq Oral TID  . psyllium  1 packet Oral q morning - 10a  . sodium chloride flush  3 mL Intravenous Q12H   Continuous Infusions: . sodium chloride      Procedures/Studies: Dg Chest 2 View  Result Date: 12/13/2017 CLINICAL DATA:  Shortness of breath EXAM: CHEST - 2 VIEW COMPARISON:  12/04/2017 FINDINGS: Cardiac enlargement is again seen. Pacing device is noted. Lungs are well aerated with small right pleural effusion and right basilar atelectasis stable from the prior exam. Very mild vascular congestion is noted. Postsurgical changes in left shoulder are seen. IMPRESSION:  Mild vascular congestion with right basilar changes stable from the prior exam. Electronically Signed   By: Inez Catalina M.D.   On: 12/13/2017 15:31   Dg Chest 2 View  Result Date: 12/04/2017 CLINICAL DATA:  Shortness of breath.  Cough. EXAM: CHEST - 2 VIEW COMPARISON:  07/04/2017 FINDINGS: Left subclavian approach pacemaker remains in place with leads terminating over the right atrium and right ventricle. The cardiac silhouette is borderline to mildly enlarged. Thoracic aortic atherosclerotic calcification and tortuosity are noted. Lung volumes are lower than on the prior study with pulmonary vascular congestion and mild diffuse increase in the interstitial markings bilaterally. There are new small bilateral pleural effusions. No pneumothorax is identified. Prior left shoulder arthroplasty and severe right glenohumeral osteoarthrosis are noted. IMPRESSION: Mild interstitial edema and small bilateral pleural effusions. Electronically Signed   By: Seymour Bars.D.  On: 12/04/2017 13:47   Ct Head Wo Contrast  Result Date: 11/17/2017 CLINICAL DATA:  84 year old male with history of trauma from a fall with tear on the top of the scalp. EXAM: CT HEAD WITHOUT CONTRAST TECHNIQUE: Contiguous axial images were obtained from the base of the skull through the vertex without intravenous contrast. COMPARISON:  None. FINDINGS: Brain: Mild cerebral atrophy. Patchy and confluent areas of decreased attenuation are noted throughout the deep and periventricular white matter of the cerebral hemispheres bilaterally, compatible with chronic microvascular ischemic disease. No evidence of acute infarction, hemorrhage, hydrocephalus, extra-axial collection or mass lesion/mass effect. Vascular: No hyperdense vessel or unexpected calcification. Skull: Normal. Negative for fracture or focal lesion. Sinuses/Orbits: Diminutive and completely opacified right maxillary sinus, likely sequela of longstanding chronic sinusitis. No acute  finding. Other: None. IMPRESSION: 1. No acute intracranial abnormalities. 2. Mild cerebral atrophy with chronic microvascular ischemic changes in the cerebral white matter, as above. Electronically Signed   By: Vinnie Langton M.D.   On: 11/17/2017 21:35   Dg Swallowing Func-speech Pathology  Result Date: 11/17/2017 CLINICAL DATA:  Silent aspiration. EXAM: MODIFIED BARIUM SWALLOW TECHNIQUE: Different consistencies of barium were administered orally to the patient by the Speech Pathologist. Imaging of the pharynx was performed in the lateral projection. FLUOROSCOPY TIME:  Fluoroscopy Time:  1 minutes 48 seconds COMPARISON:  None. FINDINGS: Thin liquid-the patient had several episodes of flash laryngeal penetration. Pure- within normal limits Pure with cracker- within normal limits Barium tablet -  within normal limits IMPRESSION: Recurrent flash laryngeal penetration with thin liquids. Otherwise, normal exam. Please refer to the Speech Pathologists report for complete details and recommendations. Electronically Signed   By: Lorriane Shire M.D.   On: 11/17/2017 15:56    Orson Eva, DO  [facility] Pager [phone]  If 7PM-7AM, please contact night-coverage [email or web address] Password [email or web address] 12/16/2017, 3:50 PM   LOS: 3 days

## 2017-12-16 NOTE — Progress Notes (Signed)
Critical Lab value of Na 119. Value is slightly improved over previous of 118.  MD made aware via text page.

## 2017-12-17 LAB — BASIC METABOLIC PANEL
ANION GAP: 7 (ref 5–15)
BUN: 18 mg/dL (ref 8–23)
CALCIUM: 8.6 mg/dL — AB (ref 8.9–10.3)
CHLORIDE: 83 mmol/L — AB (ref 98–111)
CO2: 31 mmol/L (ref 22–32)
CREATININE: 0.84 mg/dL (ref 0.61–1.24)
GFR calc Af Amer: >60 mL/min (ref >60)
GFR calc non Af Amer: >60 mL/min (ref >60)
GLUCOSE: 112 mg/dL — AB (ref 70–99)
Potassium: 4.2 mmol/L (ref 3.5–5.1)
Sodium: 121 mmol/L — ABNORMAL LOW (ref 135–145)

## 2017-12-17 MED ORDER — FUROSEMIDE 40 MG PO TABS
40.0000 mg | ORAL_TABLET | Freq: Every day | ORAL | Status: DC
  Administered 2017-12-17 – 2017-12-18 (×2): 40 mg via ORAL
  Filled 2017-12-17 (×2): qty 1

## 2017-12-17 NOTE — Care Management Important Message (Signed)
Important Message  Patient Details  Name: Shawn Frye MRN: [id number] Date of Birth: Sep 03, 1918   Medicare Important Message Given:  Yes    [name] 12/17/2017, 1:34 PM

## 2017-12-17 NOTE — NC FL2 (Signed)
[place] LEVEL OF CARE SCREENING TOOL     IDENTIFICATION  Patient Name: Shawn Frye Birthdate: [birth year] Sex: male Admission Date (Current Location): 12/13/2017  [facility] and [place] Number:  [employer] and Address:  [place] [address], [place]      Provider Number: [id number]  Attending Physician Name and Address:  Isaac Bliss, [place]  Relative Name and Phone Number:       Current Level of Care: Hospital Recommended Level of Care: [place] Prior Approval Number:    Date Approved/Denied:   PASRR Number:    Discharge Plan: SNF    Current Diagnoses: Patient Active Problem List   Diagnosis Date Noted  . Acute on chronic combined systolic and diastolic CHF (congestive heart failure) ([place]) 12/14/2017  . Acute respiratory failure with hypoxia ([place]) 12/13/2017  . Acute on chronic systolic CHF (congestive heart failure) ([place]) 12/13/2017  . Dilated cardiomyopathy ([place]) 12/13/2017  . Cardiac pacemaker 12/13/2017  . Hyponatremia 12/13/2017  . Primary osteoarthritis, right shoulder 01/08/2017  . Mobitz type 2 second degree atrioventricular block 10/02/2015  . Bradycardia 08/29/2015  . Aneurysm of abdominal vessel ([place]) 03/21/2011    Orientation RESPIRATION BLADDER Height & Weight     Self, Time, Situation, Place  Normal Continent Weight: 151 lb 7.3 oz (68.7 kg) Height:  5\' 7"  (170.2 cm)  BEHAVIORAL SYMPTOMS/MOOD NEUROLOGICAL BOWEL NUTRITION STATUS      Continent Diet(Heart Healthy, Fluid restriction: 1578mL fluid)  AMBULATORY STATUS COMMUNICATION OF NEEDS Skin   Limited Assist Verbally Normal                       Personal Care Assistance Level of Assistance  Bathing, Feeding, Dressing Bathing Assistance: Limited assistance Feeding assistance: Independent Dressing Assistance: Limited assistance     Functional Limitations Info  Sight, Hearing, Speech Sight Info: Adequate Hearing  Info: Adequate Speech Info: Adequate    SPECIAL CARE FACTORS FREQUENCY  PT (By licensed PT)     PT Frequency: 5x/week               Contractures Contractures Info: Not present    Additional Factors Info  Code Status, Allergies Code Status Info: Full Allergies Info: Percocet, Noroxin, Celebrex,            Current Medications (12/17/2017):  This is the current hospital active medication list Current Facility-Administered Medications  Medication Dose Route Frequency Provider Last Rate Last Dose  . 0.9 %  sodium chloride infusion  250 mL Intravenous PRN Truett Mainland, DO      . acetaminophen (TYLENOL) tablet 650 mg  650 mg Oral Q4H PRN Truett Mainland, DO   650 mg at 12/15/17 2121  . enoxaparin (LOVENOX) injection 40 mg  40 mg Subcutaneous Q24H Truett Mainland, DO   40 mg at 12/16/17 2102  . ferrous sulfate tablet 325 mg  325 mg Oral Daily Truett Mainland, DO   325 mg at 12/17/17 1021  . furosemide (LASIX) tablet 40 mg  40 mg Oral Daily Arnoldo Lenis, MD   40 mg at 12/17/17 1021  . MEDLINE mouth rinse  15 mL Mouth Rinse BID Truett Mainland, DO   15 mL at 12/16/17 4540  . ondansetron (ZOFRAN) injection 4 mg  4 mg Intravenous Q6H PRN Truett Mainland, DO      . polyethylene glycol (MIRALAX / GLYCOLAX) packet 17 g  17 g Oral Daily  Orson Eva, MD   17 g at 12/17/17 1021  . potassium chloride SA (K-DUR,KLOR-CON) CR tablet 40 mEq  40 mEq Oral TID Truett Mainland, DO   40 mEq at 12/17/17 1021  . psyllium (HYDROCIL/METAMUCIL) packet 1 packet  1 packet Oral q morning - 10a Truett Mainland, DO   1 packet at 12/17/17 1021  . senna (SENOKOT) tablet 17.2 mg  2 tablet Oral Daily Tat, David, MD   17.2 mg at 12/17/17 1021  . sodium chloride flush (NS) 0.9 % injection 3 mL  3 mL Intravenous Q12H Truett Mainland, DO   3 mL at 12/17/17 1022  . sodium chloride flush (NS) 0.9 % injection 3 mL  3 mL Intravenous PRN Truett Mainland, DO      . traZODone (DESYREL) tablet 25 mg  25 mg Oral  QHS PRN Gardiner Barefoot, NP   25 mg at 12/16/17 2103     Discharge Medications: Please see discharge summary for a list of discharge medications.  Relevant Imaging Results:  Relevant Lab Results:   Additional Information SSN [id number] Clydene Pugh, LCSW

## 2017-12-17 NOTE — Plan of Care (Signed)
  Problem: Acute Rehab PT Goals(only PT should resolve) Goal: Pt Will Go Supine/Side To Sit Outcome: Progressing Flowsheets (Taken 12/17/2017 1232) Pt will go Supine/Side to Sit: with modified independence Goal: Patient Will Transfer Sit To/From Stand Outcome: Progressing Flowsheets (Taken 12/17/2017 1232) Patient will transfer sit to/from stand: with min guard assist Goal: Pt Will Transfer Bed To Chair/Chair To Bed Outcome: Progressing Flowsheets (Taken 12/17/2017 1232) Pt will Transfer Bed to Chair/Chair to Bed: min guard assist Goal: Pt Will Ambulate Outcome: Progressing Flowsheets (Taken 12/17/2017 1232) Pt will Ambulate: with min guard assist;with rolling walker   12:32 PM, 12/17/17 Lonell Grandchild, MPT Physical Therapist with [facility] [phone] office [phone] mobile phone

## 2017-12-17 NOTE — Evaluation (Signed)
Physical Therapy Evaluation Patient Details Name: Shawn Frye MRN: [id number] DOB: [birth year] Today's Date: 12/17/2017   History of Present Illness  Shawn Frye is a 84 y.o. male with a history of dilated cardiomyopathy on echo 10/2017 with EF of 25%, GERD, third-degree heart block with [facility] Jude pacemaker, abdominal aortic aneurysm which is stable, symptomatic bradycardia.  Patient seen for shortness of breath over the past 7 days that is worsening.  Symptoms are worse with exertion and laying flat and improved with sitting up.  He has noted some peripheral edema.  He admits to a cough is nonproductive.  No other palliating or provoking factors.    Clinical Impression  Patient very hard of hearing without hearing aides, demonstrates slow labored movement for sitting up, transfers and ambulation, fatigues easily and becomes more of a fall risk once fatigued, on room air during visit with O2 saturations at 95% after walking and patient tolerated sitting up in chair.  Patient will benefit from continued physical therapy in hospital and recommended venue below to increase strength, balance, endurance for safe ADLs and gait.    Follow Up Recommendations SNF;Supervision/Assistance - 24 hour    Equipment Recommendations  None recommended by PT    Recommendations for Other Services       Precautions / Restrictions        Mobility  Bed Mobility Overal bed mobility: Needs Assistance Bed Mobility: Supine to Sit     Supine to sit: Min guard     General bed mobility comments: labored movement  Transfers Overall transfer level: Needs assistance Equipment used: Rolling walker (2 wheeled) Transfers: Stand Pivot Transfers;Squat Pivot Transfers   Stand pivot transfers: Min assist Squat pivot transfers: Min assist        Ambulation/Gait Ambulation/Gait assistance: Min assist Gait Distance (Feet): 30 Feet Assistive device: Rolling walker (2 wheeled) Gait Pattern/deviations: Decreased  step length - right;Decreased step length - left;Decreased stride length Gait velocity: slow   General Gait Details: labored slow unsteady cadence with kyphotic posture, no loss of balance, limited secondary to fatigue  Stairs            Wheelchair Mobility    Modified Rankin (Stroke Patients Only)       Balance Overall balance assessment: Needs assistance Sitting-balance support: Feet supported;No upper extremity supported Sitting balance-Leahy Scale: Good     Standing balance support: Bilateral upper extremity supported;During functional activity Standing balance-Leahy Scale: Fair                               Pertinent Vitals/Pain Pain Assessment: No/denies pain    Home Living Family/patient expects to be discharged to:: Private residence Living Arrangements: Spouse/significant other Available Help at Discharge: Family;Personal care attendant Type of Home: House Home Access: Level entry     Home Layout: One level Home Equipment: Cane - single point;Walker - 2 wheels;Bedside commode;Wheelchair - manual      Prior Function Level of Independence: Needs assistance   Gait / Transfers Assistance Needed: household ambulator with RW  ADL's / Homemaking Assistance Needed: home aide 4 hours/day x 4 days/week        Hand Dominance        Extremity/Trunk Assessment   Upper Extremity Assessment Upper Extremity Assessment: Generalized weakness    Lower Extremity Assessment Lower Extremity Assessment: Generalized weakness    Cervical / Trunk Assessment Cervical / Trunk Assessment: Kyphotic  Communication   Communication: No  difficulties  Cognition Arousal/Alertness: Awake/alert Behavior During Therapy: WFL for tasks assessed/performed Overall Cognitive Status: Within Functional Limits for tasks assessed                                        General Comments      Exercises     Assessment/Plan    PT Assessment  Patient needs continued PT services  PT Problem List Decreased activity tolerance;Decreased balance;Decreased strength;Decreased mobility       PT Treatment Interventions Gait training;Stair training;Functional mobility training;Therapeutic activities;Therapeutic exercise;Patient/family education    PT Goals (Current goals can be found in the Care Plan section)  Acute Rehab PT Goals Patient Stated Goal: return home PT Goal Formulation: With patient Time For Goal Achievement: 12/31/17 Potential to Achieve Goals: Good    Frequency Min 3X/week   Barriers to discharge        Co-evaluation               AM-PAC PT "6 Clicks" Daily Activity  Outcome Measure Difficulty turning over in bed (including adjusting bedclothes, sheets and blankets)?: None Difficulty moving from lying on back to sitting on the side of the bed? : A Little Difficulty sitting down on and standing up from a chair with arms (e.g., wheelchair, bedside commode, etc,.)?: A Little Help needed moving to and from a bed to chair (including a wheelchair)?: A Little Help needed walking in hospital room?: A Lot Help needed climbing 3-5 steps with a railing? : A Lot 6 Click Score: 17    End of Session   Activity Tolerance: Patient tolerated treatment well;Patient limited by fatigue Patient left: in chair;with call bell/phone within reach Nurse Communication: Mobility status;Other (comment)(nursing staff aware patient left up in chair) PT Visit Diagnosis: Unsteadiness on feet (R26.81);Other abnormalities of gait and mobility (R26.89);Muscle weakness (generalized) (M62.81)    Time: 5329-9242 PT Time Calculation (min) (ACUTE ONLY): 29 min   Charges:   PT Evaluation $PT Eval Moderate Complexity: 1 Mod PT Treatments $Therapeutic Activity: 23-37 mins        12:31 PM, 12/17/17 Lonell Grandchild, MPT Physical Therapist with [facility] [phone] office [phone] mobile phone

## 2017-12-17 NOTE — Progress Notes (Signed)
Progress Note  Patient Name: Shawn Frye Date of Encounter: 12/17/2017  Primary Cardiologist: Rozann Lesches, MD   Subjective   SOB improving.   Inpatient Medications    Scheduled Meds: . enoxaparin (LOVENOX) injection  40 mg Subcutaneous Q24H  . ferrous sulfate  325 mg Oral Daily  . mouth rinse  15 mL Mouth Rinse BID  . polyethylene glycol  17 g Oral Daily  . potassium chloride  40 mEq Oral TID  . psyllium  1 packet Oral q morning - 10a  . senna  2 tablet Oral Daily  . sodium chloride flush  3 mL Intravenous Q12H   Continuous Infusions: . sodium chloride     PRN Meds: sodium chloride, acetaminophen, ondansetron (ZOFRAN) IV, sodium chloride flush, traZODone   Vital Signs    Vitals:   12/17/17 0300 12/17/17 0400 12/17/17 0500 12/17/17 0700  BP: 105/62 94/69    Pulse: 86 88    Resp: (!) 27 18    Temp:  (!) 97.4 F (36.3 C)  97.6 F (36.4 C)  TempSrc:  Axillary  Oral  SpO2: 90% 99%    Weight:   151 lb 7.3 oz (68.7 kg)   Height:        Intake/Output Summary (Last 24 hours) at 12/17/2017 0834 Last data filed at 12/17/2017 0500 Gross per 24 hour  Intake 403 ml  Output 2600 ml  Net -2197 ml   Filed Weights   12/15/17 0500 12/16/17 0500 12/17/17 0500  Weight: 153 lb 3.5 oz (69.5 kg) 154 lb 12.2 oz (70.2 kg) 151 lb 7.3 oz (68.7 kg)    Telemetry    A sensed v paced - Personally Reviewed  ECG    na  Physical Exam   GEN: No acute distress.   Neck: mildly elevated jvd Cardiac: RRR, no murmurs, rubs, or gallops.  Respiratory: Clear to auscultation bilaterally. GI: Soft, nontender, non-distended  MS: No edema; No deformity. Neuro:  Nonfocal  Psych: Normal affect   Labs    Chemistry Recent Labs  Lab 12/13/17 1519  12/15/17 0412 12/16/17 0418 12/17/17 0409  NA 119*   < > 118* 119* 121*  K 3.1*   < > 3.6 3.9 4.2  CL 80*   < > 81* 83* 83*  CO2 29   < > 28 27 31   GLUCOSE 137*   < > 112* 109* 112*  BUN 21   < > 21 19 18   CREATININE 0.98   < > 0.95  0.83 0.84  CALCIUM 8.8*   < > 8.4* 8.4* 8.6*  PROT 7.4  --   --   --   --   ALBUMIN 3.6  --   --   --   --   AST 27  --   --   --   --   ALT 14  --   --   --   --   ALKPHOS 100  --   --   --   --   BILITOT 2.0*  --   --   --   --   GFRNONAA >60   < > >60 >60 >60  GFRAA >60   < > >60 >60 >60  ANIONGAP 10   < > 9 9 7    < > = values in this interval not displayed.     Hematology Recent Labs  Lab 12/13/17 1519  WBC 11.8*  RBC 3.80*  HGB 10.8*  HCT 32.3*  MCV 85.0  MCH 28.4  MCHC 33.4  RDW 14.6  PLT 185    Cardiac Enzymes Recent Labs  Lab 12/13/17 1519  TROPONINI <0.03   No results for input(s): TROPIPOC in the last 168 hours.   BNP Recent Labs  Lab 12/13/17 1519  BNP 1,751.0*     DDimer No results for input(s): DDIMER in the last 168 hours.   Radiology    No results found.  Cardiac Studies    Patient Profile     IZICK GASBARRO a 84 y.o.malewith a hx of pacemaker for sinus bradycardiawho is being seen today for the evaluation of CHFat the request ofDr. Tat.    Assessment & Plan    1. Acute on chronic systolic heart failure - 07/2917 echo LVEF 20%, grade II diastoilc dysfunction. Moderate MR, mod to severe TR,  PASP 56 - admitted with volume overload. I/Os incomplete this admission. Negative 2.2 L yesterday, negative 5.3 L since admission. Renal function is stable, Na trending up with diuresis. Yesterday he received IV lasix 40mg  bid.  - Medical therapy limited by soft bp's. No beta blocker or ACE/ARB/ARNI/aldactone at this time.  - from clinic note no plans to pursue cath due to advanced age.  - weight was 145 lbs 10/19/17 appt. Today 151 if scales are accurate  - soft bp's today. He is nearing euvolemia though still mildly overloaded. With soft bp's will not use IV lasix today, start oral lasix 40mg  daily - monitor bp's today, monitor response to oral lasix dosing.     2. Hyponateremia - hypervolemic hyponatremia. Trending up with  diuresis     For questions or updates, please contact [place] Please consult [email or web address] for contact info under Cardiology/STEMI.      Merrily Pew, MD  12/17/2017, 8:34 AM

## 2017-12-17 NOTE — Progress Notes (Signed)
PROGRESS NOTE    Shawn Frye  TMA:[id number] DOB: October 26, 1918 DOA: 12/13/2017 PCP: [name], MD     Brief Narrative:  84 year old man admitted from home on 8/3 due to worsening shortness of breath and cough.  He has a history of symptomatic bradycardia and is status post permanent pacemaker, history of AAA and GERD.  Chest x-ray showed vascular congestion and a right lower lobe opacity and his sodium was found to be 119.  He was also hypoxemic on arrival to the ED.  Admission was requested for further evaluation and management.   Assessment & Plan:   Principal Problem:   Acute respiratory failure with hypoxia (HCC) Active Problems:   Aneurysm of abdominal vessel (HCC)   Acute on chronic systolic CHF (congestive heart failure) (HCC)   Dilated cardiomyopathy (HCC)   Cardiac pacemaker   Hyponatremia   Acute on chronic combined systolic and diastolic CHF (congestive heart failure) (HCC)   Acute on chronic combined heart failure -Echo shows an ejection fraction of 20% with diffuse hypokinesis and grade 2 diastolic dysfunction. -Intake and output appears to be incomplete, he is approximately 5 L negative since admission, renal function is stable, sodium level is trending up with diuresis. -No beta-blocker/ACE inhibitor/ARB/Arni/Aldactone at this time due to soft blood pressures. -Seen by cardiology, no plans to pursue cath due to his advanced age. -As he is still mildly volume overloaded, due to his soft blood pressures cardiology has decided to transition IV Lasix over to oral Lasix 40 mg daily.  Next  Hyponatremia -This is hypervolemic hyponatremia. -It is trending up slightly with diuresis.  Continue to follow.  Hypoxemic acute respiratory failure -Due to pulmonary edema and acute heart failure, wean oxygen as tolerated, improving with diuresis.  AAA -Followed as an outpatient by vascular surgery -On most recent imaging in February of this year was measured at 5.6  cm   DVT prophylaxis: Lovenox Code Status: Full code Family Communication: Patient only Disposition Plan: Would anticipate discharge home over the next 24 to 48 hours with improvement in sodium levels and volume status  Consultants:   Cardiology  Procedures:   Echo as above  Antimicrobials:  Anti-infectives (From admission, onward)   None       Subjective: Sitting in chair at bedside, states shortness of breath has improved, denies chest pain  Objective: Vitals:   12/17/17 1400 12/17/17 1500 12/17/17 1600 12/17/17 1700  BP: 98/69 91/62 107/72 104/79  Pulse: 83 88 88 92  Resp: (!) 25 (!) 21 17 (!) 24  Temp:    97.8 F (36.6 C)  TempSrc:    Oral  SpO2: 94% 96% 90% 91%  Weight:      Height:        Intake/Output Summary (Last 24 hours) at 12/17/2017 1906 Last data filed at 12/17/2017 1300 Gross per 24 hour  Intake 880 ml  Output 1450 ml  Net -570 ml   Filed Weights   12/15/17 0500 12/16/17 0500 12/17/17 0500  Weight: 69.5 kg (153 lb 3.5 oz) 70.2 kg (154 lb 12.2 oz) 68.7 kg (151 lb 7.3 oz)    Examination:  General exam: Alert, awake, oriented x 3 Respiratory system: Clear to auscultation. Respiratory effort normal. Cardiovascular system:RRR. No murmurs, rubs, gallops, elevated JVD Gastrointestinal system: Abdomen is nondistended, soft and nontender. No organomegaly or masses felt. Normal bowel sounds heard. Central nervous system: Alert and oriented. No focal neurological deficits. Extremities: No C/C/E, +pedal pulses Skin: No rashes, lesions or ulcers  Psychiatry: Judgement and insight appear normal. Mood & affect appropriate.     Data Reviewed: I have personally reviewed following labs and imaging studies  CBC: Recent Labs  Lab 12/13/17 1519  WBC 11.8*  NEUTROABS 9.0*  HGB 10.8*  HCT 32.3*  MCV 85.0  PLT 696   Basic Metabolic Panel: Recent Labs  Lab 12/13/17 2043 12/14/17 0358 12/15/17 0412 12/16/17 0418 12/17/17 0409  NA 119* 121* 118*  119* 121*  K 3.2* 3.6 3.6 3.9 4.2  CL 81* 83* 81* 83* 83*  CO2 29 29 28 27 31   GLUCOSE 124* 97 112* 109* 112*  BUN 19 19 21 19 18   CREATININE 0.82 0.78 0.95 0.83 0.84  CALCIUM 8.4* 8.3* 8.4* 8.4* 8.6*  MG  --   --  1.7 1.9  --    GFR: Estimated Creatinine Clearance: 48.1 mL/min (by C-G formula based on SCr of 0.84 mg/dL). Liver Function Tests: Recent Labs  Lab 12/13/17 1519  AST 27  ALT 14  ALKPHOS 100  BILITOT 2.0*  PROT 7.4  ALBUMIN 3.6   No results for input(s): LIPASE, AMYLASE in the last 168 hours. No results for input(s): AMMONIA in the last 168 hours. Coagulation Profile: No results for input(s): INR, PROTIME in the last 168 hours. Cardiac Enzymes: Recent Labs  Lab 12/13/17 1519  TROPONINI <0.03   BNP (last 3 results) No results for input(s): PROBNP in the last 8760 hours. HbA1C: No results for input(s): HGBA1C in the last 72 hours. CBG: No results for input(s): GLUCAP in the last 168 hours. Lipid Profile: No results for input(s): CHOL, HDL, LDLCALC, TRIG, CHOLHDL, LDLDIRECT in the last 72 hours. Thyroid Function Tests: No results for input(s): TSH, T4TOTAL, FREET4, T3FREE, THYROIDAB in the last 72 hours. Anemia Panel: No results for input(s): VITAMINB12, FOLATE, FERRITIN, TIBC, IRON, RETICCTPCT in the last 72 hours. Urine analysis:    Component Value Date/Time   COLORURINE YELLOW 12/13/2017 1700   APPEARANCEUR HAZY (A) 12/13/2017 1700   LABSPEC 1.012 12/13/2017 1700   PHURINE 6.0 12/13/2017 1700   GLUCOSEU NEGATIVE 12/13/2017 1700   HGBUR MODERATE (A) 12/13/2017 1700   BILIRUBINUR NEGATIVE 12/13/2017 1700   KETONESUR NEGATIVE 12/13/2017 1700   PROTEINUR 30 (A) 12/13/2017 1700   NITRITE NEGATIVE 12/13/2017 1700   LEUKOCYTESUR LARGE (A) 12/13/2017 1700   Sepsis Labs: @LABRCNTIP (procalcitonin:4,lacticidven:4)  ) Recent Results (from the past 240 hour(s))  MRSA PCR Screening     Status: None   Collection Time: 12/13/17  6:15 PM  Result Value Ref  Range Status   MRSA by PCR NEGATIVE NEGATIVE Final    Comment:        The GeneXpert MRSA Assay (FDA approved for NASAL specimens only), is one component of a comprehensive MRSA colonization surveillance program. It is not intended to diagnose MRSA infection nor to guide or monitor treatment for MRSA infections. Performed at [facility], [address] [place], [place] [address]          Radiology Studies: No results found.      Scheduled Meds: . enoxaparin (LOVENOX) injection  40 mg Subcutaneous Q24H  . ferrous sulfate  325 mg Oral Daily  . furosemide  40 mg Oral Daily  . mouth rinse  15 mL Mouth Rinse BID  . polyethylene glycol  17 g Oral Daily  . potassium chloride  40 mEq Oral TID  . psyllium  1 packet Oral q morning - 10a  . senna  2 tablet Oral Daily  .  sodium chloride flush  3 mL Intravenous Q12H   Continuous Infusions: . sodium chloride       LOS: 4 days    Time spent: 25 minutes.    Lelon Frohlich, MD [facility] Pager [phone]  If 7PM-7AM, please contact night-coverage [email or web address] Password [email or web address] 12/17/2017, 7:06 PM

## 2017-12-18 ENCOUNTER — Non-Acute Institutional Stay (SKILLED_NURSING_FACILITY): Payer: Medicare Other | Admitting: Internal Medicine

## 2017-12-18 ENCOUNTER — Inpatient Hospital Stay
Admission: RE | Admit: 2017-12-18 | Discharge: 2018-01-03 | Disposition: A | Discharge status: Home / Self Care | Payer: Medicare Other | Source: Ambulatory Visit | Attending: Internal Medicine | Admitting: Internal Medicine

## 2017-12-18 ENCOUNTER — Encounter: Payer: Self-pay | Admitting: Internal Medicine

## 2017-12-18 DIAGNOSIS — R3911 Hesitancy of micturition: Secondary | ICD-10-CM | POA: Diagnosis not present

## 2017-12-18 DIAGNOSIS — K219 Gastro-esophageal reflux disease without esophagitis: Secondary | ICD-10-CM | POA: Diagnosis not present

## 2017-12-18 DIAGNOSIS — I5023 Acute on chronic systolic (congestive) heart failure: Secondary | ICD-10-CM

## 2017-12-18 DIAGNOSIS — J9601 Acute respiratory failure with hypoxia: Secondary | ICD-10-CM | POA: Diagnosis not present

## 2017-12-18 DIAGNOSIS — I442 Atrioventricular block, complete: Secondary | ICD-10-CM | POA: Diagnosis not present

## 2017-12-18 DIAGNOSIS — I959 Hypotension, unspecified: Secondary | ICD-10-CM | POA: Diagnosis not present

## 2017-12-18 DIAGNOSIS — R001 Bradycardia, unspecified: Secondary | ICD-10-CM | POA: Diagnosis not present

## 2017-12-18 DIAGNOSIS — R300 Dysuria: Secondary | ICD-10-CM | POA: Diagnosis not present

## 2017-12-18 DIAGNOSIS — E871 Hypo-osmolality and hyponatremia: Secondary | ICD-10-CM

## 2017-12-18 DIAGNOSIS — R488 Other symbolic dysfunctions: Secondary | ICD-10-CM | POA: Diagnosis not present

## 2017-12-18 DIAGNOSIS — I714 Abdominal aortic aneurysm, without rupture, unspecified: Secondary | ICD-10-CM

## 2017-12-18 DIAGNOSIS — I441 Atrioventricular block, second degree: Secondary | ICD-10-CM | POA: Diagnosis not present

## 2017-12-18 DIAGNOSIS — R0602 Shortness of breath: Secondary | ICD-10-CM | POA: Diagnosis not present

## 2017-12-18 DIAGNOSIS — Z9981 Dependence on supplemental oxygen: Secondary | ICD-10-CM | POA: Diagnosis not present

## 2017-12-18 DIAGNOSIS — Z950 Presence of cardiac pacemaker: Secondary | ICD-10-CM | POA: Diagnosis not present

## 2017-12-18 DIAGNOSIS — M6281 Muscle weakness (generalized): Secondary | ICD-10-CM | POA: Diagnosis not present

## 2017-12-18 DIAGNOSIS — D649 Anemia, unspecified: Secondary | ICD-10-CM

## 2017-12-18 DIAGNOSIS — Z79899 Other long term (current) drug therapy: Secondary | ICD-10-CM | POA: Diagnosis not present

## 2017-12-18 DIAGNOSIS — R262 Difficulty in walking, not elsewhere classified: Secondary | ICD-10-CM | POA: Diagnosis not present

## 2017-12-18 DIAGNOSIS — N390 Urinary tract infection, site not specified: Secondary | ICD-10-CM | POA: Diagnosis not present

## 2017-12-18 DIAGNOSIS — I5042 Chronic combined systolic (congestive) and diastolic (congestive) heart failure: Secondary | ICD-10-CM | POA: Diagnosis not present

## 2017-12-18 DIAGNOSIS — I420 Dilated cardiomyopathy: Secondary | ICD-10-CM | POA: Diagnosis not present

## 2017-12-18 DIAGNOSIS — R2689 Other abnormalities of gait and mobility: Secondary | ICD-10-CM | POA: Diagnosis not present

## 2017-12-18 DIAGNOSIS — I5043 Acute on chronic combined systolic (congestive) and diastolic (congestive) heart failure: Secondary | ICD-10-CM | POA: Diagnosis not present

## 2017-12-18 DIAGNOSIS — J9611 Chronic respiratory failure with hypoxia: Secondary | ICD-10-CM | POA: Diagnosis not present

## 2017-12-18 LAB — BASIC METABOLIC PANEL
Anion gap: 9 (ref 5–15)
BUN: 18 mg/dL (ref 8–23)
CALCIUM: 8.7 mg/dL — AB (ref 8.9–10.3)
CO2: 29 mmol/L (ref 22–32)
Chloride: 84 mmol/L — ABNORMAL LOW (ref 98–111)
Creatinine, Ser: 0.81 mg/dL (ref 0.61–1.24)
GFR calc Af Amer: >60 mL/min (ref >60)
Glucose, Bld: 91 mg/dL (ref 70–99)
Potassium: 4.1 mmol/L (ref 3.5–5.1)
Sodium: 122 mmol/L — ABNORMAL LOW (ref 135–145)

## 2017-12-18 MED ORDER — POTASSIUM CHLORIDE CRYS ER 20 MEQ PO TBCR: 40.0000 meq | EXTENDED_RELEASE_TABLET | Freq: Every day | ORAL | Status: DC

## 2017-12-18 MED ORDER — FUROSEMIDE 40 MG PO TABS: 40.0000 mg | ORAL_TABLET | Freq: Every day | ORAL | Status: DC

## 2017-12-18 NOTE — Progress Notes (Signed)
Progress Note  Patient Name: Shawn Frye Date of Encounter: 12/18/2017  Primary Cardiologist: Rozann Lesches, MD   Subjective   Denies SOB while sitting and while in bed   No CP   Feeling better after BM  Inpatient Medications    Scheduled Meds: . enoxaparin (LOVENOX) injection  40 mg Subcutaneous Q24H  . ferrous sulfate  325 mg Oral Daily  . furosemide  40 mg Oral Daily  . mouth rinse  15 mL Mouth Rinse BID  . polyethylene glycol  17 g Oral Daily  . potassium chloride  40 mEq Oral TID  . psyllium  1 packet Oral q morning - 10a  . senna  2 tablet Oral Daily  . sodium chloride flush  3 mL Intravenous Q12H   Continuous Infusions: . sodium chloride     PRN Meds: sodium chloride, acetaminophen, ondansetron (ZOFRAN) IV, sodium chloride flush, traZODone   Vital Signs    Vitals:   12/18/17 0600 12/18/17 0700 12/18/17 0800 12/18/17 0900  BP: 98/67 100/70 106/64 113/79  Pulse: 85 84 78 89  Resp: (!) 25 11 12  (!) 23  Temp:      TempSrc:      SpO2: (!) 87% 94% 95% 95%  Weight:    70.2 kg  Height:        Intake/Output Summary (Last 24 hours) at 12/18/2017 1206 Last data filed at 12/17/2017 1300 Gross per 24 hour  Intake 240 ml  Output -  Net 240 ml   Filed Weights   12/16/17 0500 12/17/17 0500 12/18/17 0900  Weight: 70.2 kg 68.7 kg 70.2 kg    Telemetry    Ventricular paced   - Personally Reviewed  ECG    Not done today  - Personally Reviewed  Physical Exam   GEN: Thin 84 yo in no acute distress.   Neck: JVP is increased to below jaw Cardiac: RRR  no murmurs, rubs, or gallops.  Respiratory: Crackles at L base    GI: Soft, nontender, non-distended  MS: No edema; No deformity. Neuro:  Nonfocal  Psych: Normal affect   Labs    Chemistry Recent Labs  Lab 12/13/17 1519  12/16/17 0418 12/17/17 0409 12/18/17 0433  NA 119*   < > 119* 121* 122*  K 3.1*   < > 3.9 4.2 4.1  CL 80*   < > 83* 83* 84*  CO2 29   < > 27 31 29   GLUCOSE 137*   < > 109* 112* 91    BUN 21   < > 19 18 18   CREATININE 0.98   < > 0.83 0.84 0.81  CALCIUM 8.8*   < > 8.4* 8.6* 8.7*  PROT 7.4  --   --   --   --   ALBUMIN 3.6  --   --   --   --   AST 27  --   --   --   --   ALT 14  --   --   --   --   ALKPHOS 100  --   --   --   --   BILITOT 2.0*  --   --   --   --   GFRNONAA >60   < > >60 >60 >60  GFRAA >60   < > >60 >60 >60  ANIONGAP 10   < > 9 7 9    < > = values in this interval not displayed.     Hematology Recent Labs  Lab 12/13/17 1519  WBC 11.8*  RBC 3.80*  HGB 10.8*  HCT 32.3*  MCV 85.0  MCH 28.4  MCHC 33.4  RDW 14.6  PLT 185    Cardiac Enzymes Recent Labs  Lab 12/13/17 1519  TROPONINI <0.03   No results for input(s): TROPIPOC in the last 168 hours.   BNP Recent Labs  Lab 12/13/17 1519  BNP 1,751.0*     DDimer No results for input(s): DDIMER in the last 168 hours.   Radiology    No results found.  Cardiac Studies     Patient Profile     84 y.o. male with hx of PPM   Admitted for CHF     Assessment & Plan    1  Acute on chronic systolic CHF   Echo LVEF 69% with Mod MR  Mod to severe TR PT has   Diuresed since admit   Still with some volume on exam but pt comfortable   With marginal BP reasonable to switch to oral lasix and follow as outpt Fluid restrict to 1.5 L per day   Low NA 2 to 3 g per day    WIll make sure pt has f/u in clinic   Marginal BP limits use of meds   Will need to be followed as outpt  2  Hyponatremia Na is improving   Still low   Follow as outpt    For questions or updates, please contact [place] Please consult [email or web address] for contact info under Cardiology/STEMI.      Signed, Dorris Carnes, MD  12/18/2017, 12:06 PM

## 2017-12-18 NOTE — Discharge Summary (Signed)
Physician Discharge Summary  Shawn Frye MCN:[id number] DOB: Jul 05, 1918 DOA: 12/13/2017  PCP: [name], MD  Admit date: 12/13/2017 Discharge date: 12/18/2017  Time spent: 45 minutes  Recommendations for Outpatient Follow-up:  -Will be discharged to SNF today. -Cardiology will arrange follow up in the office. -Fluid restriction to 1500 ml daily due to hypervolemic hyponatremia.   Discharge Diagnoses:  Principal Problem:   Acute respiratory failure with hypoxia (HCC) Active Problems:   Aneurysm of abdominal vessel (HCC)   Acute on chronic systolic CHF (congestive heart failure) (HCC)   Dilated cardiomyopathy (HCC)   Cardiac pacemaker   Hyponatremia   Acute on chronic combined systolic and diastolic CHF (congestive heart failure) ([place])   Discharge Condition: Stable  Filed Weights   12/16/17 0500 12/17/17 0500 12/18/17 0900  Weight: 70.2 kg 68.7 kg 70.2 kg    History of present illness:  As per Dr. Nehemiah Settle on 8/3: Shawn Frye is a 84 y.o. male with a history of dilated cardiomyopathy on echo 10/2017 with EF of 25%, GERD, third-degree heart block with [facility] Jude pacemaker, abdominal aortic aneurysm which is stable, symptomatic bradycardia.  Patient seen for shortness of breath over the past 7 days that is worsening.  Symptoms are worse with exertion and laying flat and improved with sitting up.  He has noted some peripheral edema.  He admits to a cough is nonproductive.  No other palliating or provoking factors.  Emergency Department Course: Patient received 20 mg of IV Lasix and was given supplemental oxygen.  BNP 1700 with a chest x-ray that shows mild vascular congestion.  His blood pressure has been between 629-476 systolically..  He has tolerated the Lasix without symptoms.  Hospital Course:   Acute on chronic combined heart failure -Echo shows an ejection fraction of 20% with diffuse hypokinesis and grade 2 diastolic dysfunction. -Intake and output appears to be  incomplete, he is approximately 5 L negative since admission, renal function is stable, sodium level is trending up with diuresis. -No beta-blocker/ACE inhibitor/ARB/Arni/Aldactone at this time due to soft blood pressures. -Seen by cardiology, no plans to pursue cath due to his advanced age. -As he is still mildly volume overloaded, due to his soft blood pressures cardiology has decided to transition IV Lasix over to oral Lasix 40 mg daily on 8/7. Will continue this dose on DC.  Hyponatremia -This is hypervolemic hyponatremia. -It is trending up slightly with diuresis.   -Will need fluid restriction to 1500 ml daily to help increase sodium levels.  Hypoxemic acute respiratory failure -Due to pulmonary edema and acute heart failure, wean oxygen as tolerated, improving with diuresis, but will need oxygen on DC.  AAA -Followed as an outpatient by vascular surgery -On most recent imaging in February of this year was measured at 5.6 cm   Procedures:  ECHO a sabove   Consultations:  Cardiology  Discharge Instructions  Discharge Instructions    Diet - low sodium heart healthy   Complete by:  As directed    Increase activity slowly   Complete by:  As directed      Allergies as of 12/18/2017      Reactions   Percocet [oxycodone-acetaminophen] Hives   Noroxin [norfloxacin] Nausea And Vomiting   Celebrex [celecoxib] Rash      Medication List    STOP taking these medications   ALPRAZolam 0.25 MG tablet Commonly known as:  XANAX   metolazone 2.5 MG tablet Commonly known as:  ZAROXOLYN  metoprolol succinate 25 MG 24 hr tablet Commonly known as:  TOPROL-XL   potassium chloride 10 MEQ tablet Commonly known as:  K-DUR     TAKE these medications   ferrous sulfate 325 (65 FE) MG EC tablet Take 1 tablet by mouth daily.   furosemide 40 MG tablet Commonly known as:  LASIX Take 1 tablet (40 mg total) by mouth daily. Start taking on:  12/19/2017 What changed:     medication strength  how much to take   potassium chloride SA 20 MEQ tablet Commonly known as:  K-DUR,KLOR-CON Take 2 tablets (40 mEq total) by mouth daily.   psyllium 28 % packet Commonly known as:  METAMUCIL SMOOTH TEXTURE Take 1 packet by mouth every morning.   ranitidine 150 MG tablet Commonly known as:  ZANTAC Take 150 mg by mouth at bedtime.      Allergies  Allergen Reactions  . Percocet [Oxycodone-Acetaminophen] Hives  . Noroxin [Norfloxacin] Nausea And Vomiting  . Celebrex [Celecoxib] Rash   Contact information for after-discharge care    [facility] .   Service:  Skilled Nursing Contact information: [address] [place] [phone]               The results of significant diagnostics from this hospitalization (including imaging, microbiology, ancillary and laboratory) are listed below for reference.    Significant Diagnostic Studies: Dg Chest 2 View  Result Date: 12/13/2017 CLINICAL DATA:  Shortness of breath EXAM: CHEST - 2 VIEW COMPARISON:  12/04/2017 FINDINGS: Cardiac enlargement is again seen. Pacing device is noted. Lungs are well aerated with small right pleural effusion and right basilar atelectasis stable from the prior exam. Very mild vascular congestion is noted. Postsurgical changes in left shoulder are seen. IMPRESSION: Mild vascular congestion with right basilar changes stable from the prior exam. Electronically Signed   By: Inez Catalina M.D.   On: 12/13/2017 15:31   Dg Chest 2 View  Result Date: 12/04/2017 CLINICAL DATA:  Shortness of breath.  Cough. EXAM: CHEST - 2 VIEW COMPARISON:  07/04/2017 FINDINGS: Left subclavian approach pacemaker remains in place with leads terminating over the right atrium and right ventricle. The cardiac silhouette is borderline to mildly enlarged. Thoracic aortic atherosclerotic calcification and tortuosity are noted. Lung volumes are lower  than on the prior study with pulmonary vascular congestion and mild diffuse increase in the interstitial markings bilaterally. There are new small bilateral pleural effusions. No pneumothorax is identified. Prior left shoulder arthroplasty and severe right glenohumeral osteoarthrosis are noted. IMPRESSION: Mild interstitial edema and small bilateral pleural effusions. Electronically Signed   By: Logan Bores M.D.   On: 12/04/2017 13:47    Microbiology: Recent Results (from the past 240 hour(s))  MRSA PCR Screening     Status: None   Collection Time: 12/13/17  6:15 PM  Result Value Ref Range Status   MRSA by PCR NEGATIVE NEGATIVE Final    Comment:        The GeneXpert MRSA Assay (FDA approved for NASAL specimens only), is one component of a comprehensive MRSA colonization surveillance program. It is not intended to diagnose MRSA infection nor to guide or monitor treatment for MRSA infections. Performed at [facility], [address] [place], [place] [address]      Labs: Basic Metabolic Panel: Recent Labs  Lab 12/14/17 0358 12/15/17 0412 12/16/17 0418 12/17/17 0409 12/18/17 0433  NA 121* 118* 119* 121* 122*  K 3.6  3.6 3.9 4.2 4.1  CL 83* 81* 83* 83* 84*  CO2 29 28 27 31 29   GLUCOSE 97 112* 109* 112* 91  BUN 19 21 19 18 18   CREATININE 0.78 0.95 0.83 0.84 0.81  CALCIUM 8.3* 8.4* 8.4* 8.6* 8.7*  MG  --  1.7 1.9  --   --    Liver Function Tests: Recent Labs  Lab 12/13/17 1519  AST 27  ALT 14  ALKPHOS 100  BILITOT 2.0*  PROT 7.4  ALBUMIN 3.6   No results for input(s): LIPASE, AMYLASE in the last 168 hours. No results for input(s): AMMONIA in the last 168 hours. CBC: Recent Labs  Lab 12/13/17 1519  WBC 11.8*  NEUTROABS 9.0*  HGB 10.8*  HCT 32.3*  MCV 85.0  PLT 185   Cardiac Enzymes: Recent Labs  Lab 12/13/17 1519  TROPONINI <0.03   BNP: BNP (last 3 results) Recent Labs    12/13/17 1519  BNP 1,751.0*    ProBNP (last 3 results) No results for  input(s): PROBNP in the last 8760 hours.  CBG: No results for input(s): GLUCAP in the last 168 hours.     Signed:  Lelon Frohlich  [facility] Pager: [phone] 12/18/2017, 12:21 PM

## 2017-12-18 NOTE — Progress Notes (Signed)
Pt discharged to SNF, [facility]. PIV removed; no complications. Instructions given to [place], nurse @ [facility]. Pt taken over in [facility] by RN. Family has belongings.

## 2017-12-18 NOTE — Clinical Social Work Placement (Signed)
   CLINICAL SOCIAL WORK PLACEMENT  NOTE  Date:  12/18/2017  Patient Details  Name: Shawn Frye MRN: [id number] Date of Birth: 03/10/1919  Clinical Social Work is seeking post-discharge placement for this patient at the [place] level of care (*CSW will initial, date and re-position this form in  chart as items are completed):  Yes   Patient/family provided with [place] Work Department's list of facilities offering this level of care within the geographic area requested by the patient (or if unable, by the patient's family).      Patient/family informed of their freedom to choose among providers that offer the needed level of care, that participate in Medicare, Medicaid or managed care program needed by the patient, have an available bed and are willing to accept the patient.  Yes   Patient/family informed of [facility]'s ownership interest in [facility] and [facility], as well as of the fact that they are under no obligation to receive care at these facilities.  PASRR submitted to EDS on 12/17/17     PASRR number received on 12/17/17     Existing PASRR number confirmed on       FL2 transmitted to all facilities in geographic area requested by pt/family on 12/17/17     FL2 transmitted to all facilities within larger geographic area on       Patient informed that his/her managed care company has contracts with or will negotiate with certain facilities, including the following:        Yes   Patient/family informed of bed offers received.  Patient chooses bed at [facility]     Physician recommends and patient chooses bed at      Patient to be transferred to [facility] on 12/18/17.  Patient to be transferred to facility by [facility] staff     Patient family notified on 12/18/17 of transfer.  Name of family member notified:  brother in law and sister in law who were at bedside.     PHYSICIAN       Additional Comment:   LCSW signing off.  Discharge clinicals sent to facility.   _______________________________________________ Ihor Gully, LCSW 12/18/2017, 2:04 PM

## 2017-12-18 NOTE — Progress Notes (Signed)
This is an acute visit.  Level care skilled.  Facility is [facility].  Chief complaint acute visit status post hospitalization for acute on chronic systolic and diastolic CHF.  History of present illness.  Patient is a pleasant 84 year old male with a history of dilated cardiomyopathy on echo in June 2019 ejection fraction of 25% with grade 2 diastolic CHF- as well as a history of GERD-third-degree heart block with [facility] pacemaker as well as an abdominal aortic aneurysm and history of bradycardia.  Patient had increased shortness of breath at home symptoms worsened he also had a nonproductive cough.  He went to the ER where he received IV Lasix and supplemental oxygen-his BNP was noted to be 1700.  X-ray showed mild vascular congestion in the chest.  Upon discharge his Lasix was changed to p.o. 40 mg a day.  He was seen by cardiology with no plans to pursue a cardiac cath because of his advanced age  Was also noted to be significantly hyponatremic with sodium of 119- this was thought to be hypervolemia hyponatremia-and did gradually improved somewhat with diuresis-he is on a fluid restricted diet.  Regards to his abdominal aortic aneurysm this is followed by vascular surgery- imaging February 2019 showed diameter of 5.6 cm.  Currently he is resting in bed he appears a little bit anxious- apparently he did have a Foley catheter at one point in the hospital that was discontinued around noon today. He says he hopes he can urinate on his own but is somewhat nervous about it  Vital signs are stable his O2 saturation is in the high 90s on room air  On discharge sodium had risen up to 122.  Past Medical History:  Diagnosis Date  . AAA (abdominal aortic aneurysm) ([place])   . Arthritis   . Chronic combined systolic (congestive) and diastolic (congestive) heart failure ([place])    a. 10/2017: echo showing a reduced EF of 25%, diffuse HK with akinesis of the apical myocardium,  Grade 2 DD, mild AI, mild to moderate MR, and mild to moderate TR  . GERD (gastroesophageal reflux disease)   . Heart block AV second degree    St. Jude pacemaker May 2017 - Dr. [name]  . Infection of bladder   . Joint pain   . Symptomatic bradycardia         Past Surgical History:  Procedure Laterality Date  . BACK SURGERY    . CATARACT EXTRACTION W/ INTRAOCULAR LENS  IMPLANT, BILATERAL Bilateral   . EP IMPLANTABLE DEVICE N/A 10/02/2015   Procedure: Pacemaker Implant;  Surgeon: Evans Lance, MD;  Location: [facility];  Service: Cardiovascular;  Laterality: N/A;  . EXCISIONAL HEMORRHOIDECTOMY    . HEMORROIDECTOMY    . INGUINAL HERNIA REPAIR Bilateral 1991  . INSERT / REPLACE / REMOVE PACEMAKER    . JOINT REPLACEMENT    . [place] SURGERY  2005  . TOTAL KNEE ARTHROPLASTY Bilateral 1998 -  2003  . TOTAL SHOULDER REPLACEMENT Left 1999   Partial  . TYMPANOSTOMY TUBE PLACEMENT Right    Social History:  reports that he quit smoking about 67 years ago. His smoking use included cigarettes. He has a 15.00 pack-year smoking history. He has never used smokeless tobacco. He reports that he does not drink alcohol or use drugs. Patient lives at home      Allergies  Allergen Reactions  . Percocet [Oxycodone-Acetaminophen] Hives  . Noroxin [Norfloxacin] Nausea And Vomiting  . Celebrex [Celecoxib] Rash  Family History  Problem Relation Age of Onset  . Heart disease Father        NOT  before age 18  . Pneumonia Mother   . Cystic fibrosis Paternal Aunt   . Kidney disease Neg Hx        MEDICATIONS  ferrous sulfate 325 (65 FE) MG EC tablet Take 1 tablet by mouth daily.   furosemide 40 MG tablet Commonly known as:  LASIX Take 1 tablet (40 mg total) by mouth daily. Start taking on:  12/19/2017 What changed:    medication strength  how much to take   potassium chloride SA 20 MEQ tablet Commonly known as:  K-DUR,KLOR-CON Take 2  tablets (40 mEq total) by mouth daily.   psyllium 28 % packet Commonly known as:  METAMUCIL SMOOTH TEXTURE Take 1 packet by mouth every morning.   ranitidine 150 MG tablet Commonly known as:  ZANTAC Take 150 mg by mouth at bedtime.      Review of systems.  General is not complaining of any fever or chills- says is a little bit anxious at times feels he does not know where he is at.  Skin is not complain of rashes itching or diaphoresis.  Head ears eyes nose mouth and throat is not complaining of any sore throat or visual changes.  Respiratory complains of an occasional cough productive of white phlegm- at times says he feels a bit short of breath-but this is not persistent.   Cardiac is not complaining of chest pain lower extremity edema.  GI does not complain of abdominal pain nausea vomiting diarrhea or constipation.  GU is not complaining of dysuria he did have a catheter at one point in the hospital is concerned that he might not be able to urinate--does not complain of dysuria or pain   Musculoskeletal- currently not complaining of joint pain does have weakness and some debility.  Neurologic does not complain of dizziness headache or syncope.  Psych does not complain of feeling depressed or anxious   Physical exam.  Is 97.1 pulse is 96-respiration 17 blood pressure taken manually 102/68- oxygen saturation is in the high 90s on room air.  In general this is a pleasant somewhat frail-appearing elderly male in no distress lying comfortably in bed-he is a little bit anxious.  His skin is warm and dry he does have numerous seborrheic keratosis and solar induced changes.  Eyes visual acuity appears to be intact sclera and conjunctive are clear.  Oropharynx is clear mucous membranes moist.  Chest there is no labored breathing he has slight amount of expiratory wheezing is fairly minimal right at the end of expiration.  Heart is regular rate and rhythm without  murmur gallop or rub-he has minimal lower extremity edema-.  Abdomen is soft nontender with positive bowel sounds.  GU could not really appreciate suprapubic tenderness or pain-  Musculoskeletal Limited exam since he is in bed but upper extremity strength appears preserved he is able to move his lower extremities bilaterally.  Neurologic is grossly intact without lateralizing findings his speech is clear.  Psych he is oriented to self is aware that his wife is in the facility and name the appropriate room number- appear to give an accurate history of being in the hospital-but says at times he feels he is confused apparently is more so in the morning   Labs.  December 18, 2017.  Sodium 122 potassium 4.1 BUN 18 creatinine 0.81.  December 13, 2017.  WBC 11.8 hemoglobin  10.8 platelets 185.  December 13, 2017.  Liver function test within normal limits.    Assessment and plan.  1.  Acute on chronic combined CHF- ejection fraction on echo in June showed 20% diffuse hypokinesis and grade 2 diastolic dysfunction.  He is on Lasix 40 mg a day- not on a beta-blocker or ACE inhibitor arm or additional diuretic at this time because of somewhat soft blood pressures- get manual blood pressure today was 102/68-this is comparable with reading earlier when he arrived in the facility.  Per cardiology no aggressive work-up with catheterization because of his advanced age.  At this point monitor clinically he appears to be stable time although is somewhat anxious.  2 history of hyponatremia thought to be hypervolemic- it is trending up with diuresis as well as fluid restrictions at this point continue fluid restrictions and will update a BMP tomorrow.  3.-  Hypoxia this was thought again due to acute respiratory failure because of pulmonary edema and CHF-he is no longer on oxygen --oxygen saturation this afternoon is in the high 90s   #4 anemia- he is on iron apparently has been on this chronically  hemoglobin was 10.8 on lab done in hospital from what I been able to assess appears this is relatively baseline   #5 history of abdominal aortic aneurysm he is followed by vascular again diameter on recent imaging in February was 5.6 cm.  6.  Mild leukocytosis on lab done approximately 5 days ago- will update this tomorrowshe is not really complaining of dysuria increased cough or congestion  Beyond  baseline he is not complaining of fever  Or  chills-  #7 GERD-he does continue on Zantac  #8 history of bradycardia he does have a pacemaker apparently this was implanted in May 2017  #9 urinary retention?-  I have spoken with nursing to monitor urinary output- if no timely urination or inadequate- likely will need a bladder scan or in and out cath  Again will update a CBC with differential and metabolic panel tomorrow with  a history of mild leukocytosis and hyponatremia-continue fluid restrictions- monitor status and weights As well as urinary output   [id number]- of note greater than 35 minutes spent assessing patient-reviewing his chart and labs- coordinating and formulating plan of care- of note greater than 50% of time spent coordinating a plan of care

## 2017-12-19 ENCOUNTER — Encounter (HOSPITAL_COMMUNITY)
Admission: RE | Admit: 2017-12-19 | Discharge: 2017-12-19 | Disposition: A | Discharge status: Home / Self Care | Payer: Medicare Other | Source: Skilled Nursing Facility | Attending: Internal Medicine | Admitting: Internal Medicine

## 2017-12-19 ENCOUNTER — Encounter: Payer: Self-pay | Admitting: Internal Medicine

## 2017-12-19 ENCOUNTER — Non-Acute Institutional Stay (SKILLED_NURSING_FACILITY): Payer: Medicare Other | Admitting: Internal Medicine

## 2017-12-19 DIAGNOSIS — R3911 Hesitancy of micturition: Secondary | ICD-10-CM

## 2017-12-19 DIAGNOSIS — D649 Anemia, unspecified: Secondary | ICD-10-CM

## 2017-12-19 DIAGNOSIS — I5043 Acute on chronic combined systolic (congestive) and diastolic (congestive) heart failure: Secondary | ICD-10-CM

## 2017-12-19 DIAGNOSIS — E871 Hypo-osmolality and hyponatremia: Secondary | ICD-10-CM | POA: Insufficient documentation

## 2017-12-19 LAB — CBC WITH DIFFERENTIAL/PLATELET
BASOS PCT: 0 %
Basophils Absolute: 0 10*3/uL (ref 0.0–0.1)
Eosinophils Absolute: 0.2 10*3/uL (ref 0.0–0.7)
Eosinophils Relative: 2 %
HEMATOCRIT: 30.7 % — AB (ref 39.0–52.0)
Hemoglobin: 10.2 g/dL — ABNORMAL LOW (ref 13.0–17.0)
LYMPHS PCT: 12 %
Lymphs Abs: 1.1 10*3/uL (ref 0.7–4.0)
MCH: 28 pg (ref 26.0–34.0)
MCHC: 33.2 g/dL (ref 30.0–36.0)
MCV: 84.3 fL (ref 78.0–100.0)
Monocytes Absolute: 1.4 10*3/uL (ref 0.1–1.0)
Monocytes Relative: 16 %
NEUTROS ABS: 6 10*3/uL (ref 1.7–7.7)
NEUTROS PCT: 70 %
Platelets: 140 10*3/uL — ABNORMAL LOW (ref 150–400)
RBC: 3.64 MIL/uL — AB (ref 4.22–5.81)
RDW: 14.9 % (ref 11.5–15.5)
WBC: 8.7 10*3/uL (ref 4.0–10.5)

## 2017-12-19 LAB — BASIC METABOLIC PANEL
ANION GAP: 8 (ref 5–15)
BUN: 19 mg/dL (ref 8–23)
CO2: 28 mmol/L (ref 22–32)
Calcium: 8.6 mg/dL — ABNORMAL LOW (ref 8.9–10.3)
Chloride: 88 mmol/L — ABNORMAL LOW (ref 98–111)
Creatinine, Ser: 0.91 mg/dL (ref 0.61–1.24)
Glucose, Bld: 99 mg/dL (ref 70–99)
POTASSIUM: 4.3 mmol/L (ref 3.5–5.1)
SODIUM: 124 mmol/L — AB (ref 135–145)

## 2017-12-19 NOTE — Progress Notes (Signed)
Location:    [facility] Room Number: 144/P Place of Service:  SNF [phone]) Provider:  Cory Roughen, MD  Patient Care Team: [name], MD as PCP - General (Internal Medicine) Satira Sark, MD as PCP - Cardiology (Cardiology) Franchot Gallo, MD (Urology)  Extended Emergency Contact Information Primary Emergency Contact: strickland,ginger Mobile Phone: [phone] Relation: Niece Secondary Emergency Contact: Thurston Pounds Mobile Phone: [phone] Relation: Other  Code Status:Full Code Goals of care: Advanced Directive information Advanced Directives 12/19/2017  Does Patient Have a Medical Advance Directive? Yes  Type of Advance Directive (No Data)  Does patient want to make changes to medical advance directive? No - Patient declined  Copy of University in Chart? No - copy requested  Would patient like information on creating a medical advance directive? No - Patient declined     Chief complaint-acute visit follow-up hyponatremia as well as combined systolic and diastolic CHF  HPI:  Pt is a 84 y.o. male seen today for an acute visit for  Follow-up of hyponatremia as well as CHF.  Patient was admitted yesterday after hospitalization for CHF exacerbation he did require IV Lasix and was discharged on 40 mg of Lasix a day.  He also was noted to be significantly hyponatremic with a sodium of 119 this was thought to be hypervolemia hyponatremia and gradually improved with diuresis as well as with fluid restriction.  Sodium on discharge yesterday was 122 it is on 124 on today's lab  His other diagnoses include abdominal aortic aneurysm which is followed by vascular recent imaging in February 2019 showing a diameter of 5.6.   He also had mild leukocytosis on recent lab but this appears normalized today with a white count of 8.7.  Hemoglobin appears relatively stable at 10.2.  He is on iron  Yesterday he did  complain that he was concerned he was not urinating but apparently per staff he is urinating-he did have a catheter in the hospital-he says he is urinating today although there is some hesitancy.  Currently he is sitting in his chair comfortably vital signs appear to be stable  His weight today is 149 pounds which is comparable to yesterday.  He is not complaining of any increased shortness of breath beyond baseline at this point CHF appears stable  He is not complaining of any increased shortness of breath beyond baseline   Past Medical History:  Diagnosis Date  . AAA (abdominal aortic aneurysm) ([place])   . Arthritis   . Chronic combined systolic (congestive) and diastolic (congestive) heart failure ([place])    a. 10/2017: echo showing a reduced EF of 25%, diffuse HK with akinesis of the apical myocardium, Grade 2 DD, mild AI, mild to moderate MR, and mild to moderate TR  . GERD (gastroesophageal reflux disease)   . Heart block AV second degree    St. Jude pacemaker May 2017 - Dr. [name]  . Infection of bladder   . Joint pain   . Symptomatic bradycardia    Past Surgical History:  Procedure Laterality Date  . BACK SURGERY    . CATARACT EXTRACTION W/ INTRAOCULAR LENS  IMPLANT, BILATERAL Bilateral   . EP IMPLANTABLE DEVICE N/A 10/02/2015   Procedure: Pacemaker Implant;  Surgeon: Evans Lance, MD;  Location: [facility];  Service: Cardiovascular;  Laterality: N/A;  . EXCISIONAL HEMORRHOIDECTOMY    . HEMORROIDECTOMY    . INGUINAL HERNIA REPAIR Bilateral 1991  . INSERT /  REPLACE / REMOVE PACEMAKER    . JOINT REPLACEMENT    . [place] SURGERY  2005  . TOTAL KNEE ARTHROPLASTY Bilateral 1998 -  2003  . TOTAL SHOULDER REPLACEMENT Left 1999   Partial  . TYMPANOSTOMY TUBE PLACEMENT Right     Allergies  Allergen Reactions  . Percocet [Oxycodone-Acetaminophen] Hives  . Noroxin [Norfloxacin] Nausea And Vomiting  . Celebrex [Celecoxib] Rash    Outpatient Encounter Medications as  of 12/19/2017  Medication Sig  . ferrous sulfate 325 (65 FE) MG EC tablet Take 1 tablet by mouth daily.  . furosemide (LASIX) 40 MG tablet Take 1 tablet (40 mg total) by mouth daily.  . potassium chloride SA (K-DUR,KLOR-CON) 20 MEQ tablet Take 2 tablets (40 mEq total) by mouth daily.  . psyllium (METAMUCIL SMOOTH TEXTURE) 28 % packet Take 1 packet by mouth every morning.   . ranitidine (ZANTAC) 150 MG tablet Take 150 mg by mouth at bedtime.   No facility-administered encounter medications on file as of 12/19/2017.     Review of Systems   General is not complaining any fever or chills.  Skin is not complaining of rashes or itching or diaphoresis.  Head ears eyes nose mouth throat is not complain of visual changes he has prescription lenses does not complain of sore throat.  Respiratory is not complaining of shortness of breath today or increased cough from baseline.  Cardiac is not complaining of chest pain lower extremity edema appears to be very mild most prominent on his feet.  GI is not complaining of abdominal pain nausea or vomiting says at times will have some diarrhea nursing staff states this to and will hold his psyllium  GU is not complaining of dysuria or burning-says he does have some hesitancy at times but is urinating.  Musculoskeletal is not complaining of joint pain does complain of being weak.  Neurologic does complain of weakness does not complain of dizziness headache or syncope.  Psych does not complain of overt depression or anxiety appears to be in somewhat better spirits than yesterday  Immunization History  Administered Date(s) Administered  . Influenza-Unspecified 02/10/2017  . Pneumococcal Polysaccharide-23 08/30/2015   Pertinent  Health Maintenance Due  Topic Date Due  . INFLUENZA VACCINE  01/19/2018 (Originally 12/11/2017)  . PNA vac Low Risk Adult (2 of 2 - PCV13) 01/19/2018 (Originally 08/29/2016)   No flowsheet data found. Functional Status  Survey:    Vitals:   12/19/17 1212  BP: 122/78  Pulse: 77  Resp: 20  Temp: (!) 96.8 F (36 C)  TempSrc: Oral   Weight is 149pounds  Physical Exam   In general this is a pleasant elderly male in no distress sitting comfortably in his wheelchair.  Skin is warm and dry he does have baseline seborrheic keratosis diffuse with solar induced changes.  Eyes visual acuity appears to be intact he has prescription lenses sclera and conjunctive are clear.  Chest is clear to auscultation there is no labored breathing cannot really appreciate any wheezing today.  Heart is regular rate and rhythm with no occasional skipped beat- lower extremity edema is quite minimal most prominent in the feet.  Abdomen is soft nontender with positive bowel sounds.  GU could not really appreciate suprapubic tenderness.  Musculoskeletal--appears to move all extremities at baseline is ambulating in wheelchair.  Neurologic is grossly intact his speech is clear no lateralizing findings.  Psych he is largely alert and oriented pleasant and appropriate appears to be feeling better than  yesterday     Labs reviewed: Recent Labs    12/15/17 0412 12/16/17 0418 12/17/17 0409 12/18/17 0433 12/19/17 0430  NA 118* 119* 121* 122* 124*  K 3.6 3.9 4.2 4.1 4.3  CL 81* 83* 83* 84* 88*  CO2 28 27 31 29 28   GLUCOSE 112* 109* 112* 91 99  BUN 21 19 18 18 19   CREATININE 0.95 0.83 0.84 0.81 0.91  CALCIUM 8.4* 8.4* 8.6* 8.7* 8.6*  MG 1.7 1.9  --   --   --    Recent Labs    12/13/17 1519  AST 27  ALT 14  ALKPHOS 100  BILITOT 2.0*  PROT 7.4  ALBUMIN 3.6   Recent Labs    12/13/17 1519 12/19/17 0430  WBC 11.8* 8.7  NEUTROABS 9.0* 6.0  HGB 10.8* 10.2*  HCT 32.3* 30.7*  MCV 85.0 84.3  PLT 185 140*   No results found for: TSH No results found for: HGBA1C No results found for: CHOL, HDL, LDLCALC, LDLDIRECT, TRIG, CHOLHDL  Significant Diagnostic Results in last 30 days:  Dg Chest 2 View  Result  Date: 12/13/2017 CLINICAL DATA:  Shortness of breath EXAM: CHEST - 2 VIEW COMPARISON:  12/04/2017 FINDINGS: Cardiac enlargement is again seen. Pacing device is noted. Lungs are well aerated with small right pleural effusion and right basilar atelectasis stable from the prior exam. Very mild vascular congestion is noted. Postsurgical changes in left shoulder are seen. IMPRESSION: Mild vascular congestion with right basilar changes stable from the prior exam. Electronically Signed   By: Inez Catalina M.D.   On: 12/13/2017 15:31   Dg Chest 2 View  Result Date: 12/04/2017 CLINICAL DATA:  Shortness of breath.  Cough. EXAM: CHEST - 2 VIEW COMPARISON:  07/04/2017 FINDINGS: Left subclavian approach pacemaker remains in place with leads terminating over the right atrium and right ventricle. The cardiac silhouette is borderline to mildly enlarged. Thoracic aortic atherosclerotic calcification and tortuosity are noted. Lung volumes are lower than on the prior study with pulmonary vascular congestion and mild diffuse increase in the interstitial markings bilaterally. There are new small bilateral pleural effusions. No pneumothorax is identified. Prior left shoulder arthroplasty and severe right glenohumeral osteoarthrosis are noted. IMPRESSION: Mild interstitial edema and small bilateral pleural effusions. Electronically Signed   By: Logan Bores M.D.   On: 12/04/2017 13:47    Assessment/Plan  #1 hyponatremia-thought to be hypervolemic-this appears to be rising- although appears to be responding to diuresis as well as fluid restrictions at this point will monitor and update BMP on Monday, August 12.  2.  History of acute on chronic combined CHF with ejection fraction on echo of 20% and grade 2 diastolic dysfunction- continues on Lasix 40 mg a day is not on a beta-blocker ACE inhibitor secondary to soft blood pressures.  At this point blood pressures appear to be stable-his weight appears to be stable Mildly  Again  will update a metabolic panel first laboratory data next week.  Monitor weight daily notify provider gain greater than 3 pounds  Along  3.  History of anemia he is on iron hemoglobin 10.2 today slightly down from previous lab but not precipitously so-will monitor this periodically and continue iron.  4.  Mild leukocytosis this appears resolved I suspect this was probably reactive in the hospital and now is within normal range.  5.  Urinary hesitancy-at this point will monitor to see if this persists he is urinating per patient and staff but this will have to  be watched-at this point would be hesitant to add another medication without evaluating persistence here is not really complaining of any burning or pain.  [id number]

## 2017-12-22 ENCOUNTER — Non-Acute Institutional Stay (SKILLED_NURSING_FACILITY): Payer: Medicare Other | Admitting: Internal Medicine

## 2017-12-22 ENCOUNTER — Encounter: Payer: Self-pay | Admitting: Internal Medicine

## 2017-12-22 ENCOUNTER — Encounter (HOSPITAL_COMMUNITY)
Admission: RE | Admit: 2017-12-22 | Discharge: 2017-12-22 | Disposition: A | Discharge status: Home / Self Care | Payer: Medicare Other | Source: Skilled Nursing Facility | Attending: *Deleted | Admitting: *Deleted

## 2017-12-22 DIAGNOSIS — I5023 Acute on chronic systolic (congestive) heart failure: Secondary | ICD-10-CM

## 2017-12-22 DIAGNOSIS — Z950 Presence of cardiac pacemaker: Secondary | ICD-10-CM | POA: Diagnosis not present

## 2017-12-22 DIAGNOSIS — I441 Atrioventricular block, second degree: Secondary | ICD-10-CM

## 2017-12-22 DIAGNOSIS — E871 Hypo-osmolality and hyponatremia: Secondary | ICD-10-CM | POA: Diagnosis not present

## 2017-12-22 DIAGNOSIS — I420 Dilated cardiomyopathy: Secondary | ICD-10-CM

## 2017-12-22 LAB — BASIC METABOLIC PANEL
Anion gap: 9 (ref 5–15)
BUN: 20 mg/dL (ref 8–23)
CO2: 25 mmol/L (ref 22–32)
Calcium: 8.8 mg/dL — ABNORMAL LOW (ref 8.9–10.3)
Chloride: 97 mmol/L — ABNORMAL LOW (ref 98–111)
Creatinine, Ser: 0.88 mg/dL (ref 0.61–1.24)
GFR calc Af Amer: >60 mL/min (ref >60)
GFR calc non Af Amer: >60 mL/min (ref >60)
GLUCOSE: 108 mg/dL — AB (ref 70–99)
POTASSIUM: 3.9 mmol/L (ref 3.5–5.1)
Sodium: 131 mmol/L — ABNORMAL LOW (ref 135–145)

## 2017-12-22 NOTE — Progress Notes (Signed)
Provider: Veleta Miners MD  Location:    [place] Room Number: 144/P Place of Service:  SNF [phone])  PCP: [name], MD Patient Care Team: [name], MD as PCP - General (Internal Medicine) Satira Sark, MD as PCP - Cardiology (Cardiology) Franchot Gallo, MD (Urology)  Extended Emergency Contact Information Primary Emergency Contact: strickland,ginger Mobile Phone: [phone] Relation: Niece Secondary Emergency Contact: Thurston Pounds Mobile Phone: [phone] Relation: Other  Code Status: Full Code Goals of Care: Advanced Directive information Advanced Directives 12/22/2017  Does Patient Have a Medical Advance Directive? Yes  Type of Advance Directive (No Data)  Does patient want to make changes to medical advance directive? No - Patient declined  Copy of [place] in Chart? No - copy requested  Would patient like information on creating a medical advance directive? -      Chief Complaint  Patient presents with  . New Admit To SNF    Patient is being seen for New Admission Visit    HPI: Patient is a 84 y.o. male seen today for admission to SNF for therapy after staying in the hospital from 08/03-08/08 for Acute on Chronic Systolic Heart failure and Hyponatremia  Patient has h/o symptomatic bradycardia due to Mobitz Type 2 AV block s/p St Jude PPM Placement, GERD, AAA and was recently diagnosed with Dilated cardiomyopathy with EF of 23% with Systolic CHF.  Patient presented to ED with 7-day history of worsening dyspnea on exertion and PND.  He was found to have a BNP of 1700.  He was diuresed and was -5 L.  His repeat echo showed a EF of 20% with diffuse hypokinesis.  The cardiology did not plan to pursue cath due to his advanced age.  He also did not tolerate beta-blocker, ACE inhibitors due to low blood pressure. He also has hyponatremia which improved with diuresis.  He was put on 1500 mL fluid restriction. His  hypoxia improved with diuresis Since he continued to feel weak and lives by himself he was transferred to SNF for therapy. Patient states he feels better denies any shortness of breath chest pain plan his appetite is coming back.  He is now started walking with the walker but continues to feel little weak  Patient  lives by himself as his wife is in our facility also . He had a caregiver who helped him He was independent in his ADLs and was driving    Past Medical History:  Diagnosis Date  . AAA (abdominal aortic aneurysm) ([place])   . Arthritis   . Chronic combined systolic (congestive) and diastolic (congestive) heart failure ([place])    a. 10/2017: echo showing a reduced EF of 25%, diffuse HK with akinesis of the apical myocardium, Grade 2 DD, mild AI, mild to moderate MR, and mild to moderate TR  . GERD (gastroesophageal reflux disease)   . Heart block AV second degree    St. Jude pacemaker May 2017 - Dr. [name]  . Infection of bladder   . Joint pain   . Symptomatic bradycardia    Past Surgical History:  Procedure Laterality Date  . BACK SURGERY    . CATARACT EXTRACTION W/ INTRAOCULAR LENS  IMPLANT, BILATERAL Bilateral   . EP IMPLANTABLE DEVICE N/A 10/02/2015   Procedure: Pacemaker Implant;  Surgeon: Evans Lance, MD;  Location: [facility];  Service: Cardiovascular;  Laterality: N/A;  . EXCISIONAL HEMORRHOIDECTOMY    . HEMORROIDECTOMY    .  INGUINAL HERNIA REPAIR Bilateral 1991  . INSERT / REPLACE / REMOVE PACEMAKER    . JOINT REPLACEMENT    . [place] SURGERY  2005  . TOTAL KNEE ARTHROPLASTY Bilateral 1998 -  2003  . TOTAL SHOULDER REPLACEMENT Left 1999   Partial  . TYMPANOSTOMY TUBE PLACEMENT Right     reports that he quit smoking about 67 years ago. His smoking use included cigarettes. He has a 15.00 pack-year smoking history. He has never used smokeless tobacco. He reports that he does not drink alcohol or use drugs. Social History   Socioeconomic History  .  Marital status: Married    Spouse name: Not on file  . Number of children: Not on file  . Years of education: Not on file  . Highest education level: Not on file  Occupational History  . Not on file  Social Needs  . Financial resource strain: Not on file  . Food insecurity:    Worry: Not on file    Inability: Not on file  . Transportation needs:    Medical: Not on file    Non-medical: Not on file  Tobacco Use  . Smoking status: Former Smoker    Packs/day: 1.00    Years: 15.00    Pack years: 15.00    Types: Cigarettes    Last attempt to quit: 05/13/1950    Years since quitting: 67.6  . Smokeless tobacco: Never Used  Substance and Sexual Activity  . Alcohol use: No    Alcohol/week: 0.0 standard drinks  . Drug use: No  . Sexual activity: Never  Lifestyle  . Physical activity:    Days per week: Not on file    Minutes per session: Not on file  . Stress: Not on file  Relationships  . Social connections:    Talks on phone: Not on file    Gets together: Not on file    Attends religious service: Not on file    Active member of club or organization: Not on file    Attends meetings of clubs or organizations: Not on file    Relationship status: Not on file  . Intimate partner violence:    Fear of current or ex partner: Not on file    Emotionally abused: Not on file    Physically abused: Not on file    Forced sexual activity: Not on file  Other Topics Concern  . Not on file  Social History Narrative  . Not on file    Functional Status Survey:    Family History  Problem Relation Age of Onset  . Heart disease Father        NOT  before age 29  . Pneumonia Mother   . Cystic fibrosis Paternal Aunt   . Kidney disease Neg Hx     Health Maintenance  Topic Date Due  . INFLUENZA VACCINE  01/19/2018 (Originally 12/11/2017)  . TETANUS/TDAP  01/19/2018 (Originally 05/12/1938)  . PNA vac Low Risk Adult (2 of 2 - PCV13) 01/19/2018 (Originally 08/29/2016)    Allergies  Allergen  Reactions  . Percocet [Oxycodone-Acetaminophen] Hives  . Noroxin [Norfloxacin] Nausea And Vomiting  . Celebrex [Celecoxib] Rash    Outpatient Encounter Medications as of 12/22/2017  Medication Sig  . ferrous sulfate 325 (65 FE) MG EC tablet Take 1 tablet by mouth daily.  . furosemide (LASIX) 40 MG tablet Take 1 tablet (40 mg total) by mouth daily.  . potassium chloride SA (K-DUR,KLOR-CON) 20 MEQ tablet Take 2 tablets (  40 mEq total) by mouth daily.  . psyllium (METAMUCIL SMOOTH TEXTURE) 28 % packet Take 1 packet by mouth every morning.   . ranitidine (ZANTAC) 150 MG tablet Take 150 mg by mouth at bedtime.   No facility-administered encounter medications on file as of 12/22/2017.      Review of Systems  Review of Systems  Constitutional: Negative for activity change, appetite change, chills, diaphoresis, fatigue and fever.  HENT: Negative for mouth sores, postnasal drip, rhinorrhea, sinus pain and sore throat.   Respiratory: Negative for apnea, cough, chest tightness, shortness of breath and wheezing.   Cardiovascular: Negative for chest pain, palpitations and leg swelling.  Gastrointestinal: Negative for abdominal distention, abdominal pain, constipation, diarrhea, nausea and vomiting.  Genitourinary: Negative for dysuria and frequency.  Musculoskeletal: Negative for arthralgias, joint swelling and myalgias.  Skin: Negative for rash.  Neurological: Negative for dizziness, syncope, Psychiatric/Behavioral: Negative for behavioral problems, confusion and sleep disturbance.     Vitals:   12/22/17 1045  BP: (!) 107/58  Pulse: 90  Resp: 20  Temp: 98.1 F (36.7 C)  TempSrc: Oral  SpO2: 98%   There is no height or weight on file to calculate BMI. Physical Exam  Constitutional: He is oriented to person, place, and time. He appears well-developed and well-nourished.  HENT:  Head: Normocephalic.  Mouth/Throat: Oropharynx is clear and moist.  Eyes: Pupils are equal, round, and  reactive to light.  Neck: Neck supple.  Cardiovascular: Normal rate.  Murmur heard. Pulmonary/Chest: Effort normal and breath sounds normal. No stridor. No respiratory distress. He has no wheezes.  Abdominal: Soft. Bowel sounds are normal. He exhibits no distension. There is no tenderness. There is no guarding.  Musculoskeletal:  Has mild edema Bilateral  Neurological: He is alert and oriented to person, place, and time.  No focal deficits.  Skin: Skin is warm and dry.  Psychiatric: He has a normal mood and affect. His behavior is normal. Thought content normal.      Labs reviewed: Basic Metabolic Panel: Recent Labs    12/15/17 0412 12/16/17 0418  12/18/17 0433 12/19/17 0430 12/22/17 0600  NA 118* 119*   < > 122* 124* 131*  K 3.6 3.9   < > 4.1 4.3 3.9  CL 81* 83*   < > 84* 88* 97*  CO2 28 27   < > 29 28 25   GLUCOSE 112* 109*   < > 91 99 108*  BUN 21 19   < > 18 19 20   CREATININE 0.95 0.83   < > 0.81 0.91 0.88  CALCIUM 8.4* 8.4*   < > 8.7* 8.6* 8.8*  MG 1.7 1.9  --   --   --   --    < > = values in this interval not displayed.   Liver Function Tests: Recent Labs    12/13/17 1519  AST 27  ALT 14  ALKPHOS 100  BILITOT 2.0*  PROT 7.4  ALBUMIN 3.6   No results for input(s): LIPASE, AMYLASE in the last 8760 hours. No results for input(s): AMMONIA in the last 8760 hours. CBC: Recent Labs    12/13/17 1519 12/19/17 0430  WBC 11.8* 8.7  NEUTROABS 9.0* 6.0  HGB 10.8* 10.2*  HCT 32.3* 30.7*  MCV 85.0 84.3  PLT 185 140*   Cardiac Enzymes: Recent Labs    12/13/17 1519  TROPONINI <0.03   BNP: Invalid input(s): POCBNP No results found for: HGBA1C No results found for: TSH No results found for: [id number]  No results found for: FOLATE No results found for: IRON, TIBC, FERRITIN  Imaging and Procedures obtained prior to SNF admission: No results found.  Assessment/Plan Acute on chronic systolic CHF  On Lasix Continue to watch Weight. Discharge weight was  149 lbs Repeat BMP to Follow Renal Function   Dilated cardiomyopathy  No Further Work up Will consider Coreg if BP improves  Cardiac pacemaker Follows With Cardiology  Hyponatremia Sodium Improved with Diuresis and fluid Restriction AAA with Last Measurement of 5.6 cm Follows with Vascular Disposition Discussedin details with speech therapy Patient wife is in her facility with dementia Patient lives by himself with caregiver He is cognitively intact and plans to go home We will start therapy and see his Progress.   Family/ staff Communication:   Labs/tests ordered:  Total time spent in this patient care encounter was _45 minutes; greater than 50% of the visit spent counseling patient, reviewing records , Labs and coordinating care for problems addressed at this encounter.

## 2017-12-24 ENCOUNTER — Ambulatory Visit (INDEPENDENT_AMBULATORY_CARE_PROVIDER_SITE_OTHER): Payer: Medicare Other | Admitting: Orthopaedic Surgery

## 2017-12-25 ENCOUNTER — Encounter (HOSPITAL_COMMUNITY)
Admission: RE | Admit: 2017-12-25 | Discharge: 2017-12-25 | Disposition: A | Discharge status: Home / Self Care | Payer: Medicare Other | Source: Skilled Nursing Facility | Attending: Internal Medicine | Admitting: Internal Medicine

## 2017-12-25 DIAGNOSIS — I5042 Chronic combined systolic (congestive) and diastolic (congestive) heart failure: Secondary | ICD-10-CM | POA: Insufficient documentation

## 2017-12-25 DIAGNOSIS — E871 Hypo-osmolality and hyponatremia: Secondary | ICD-10-CM | POA: Insufficient documentation

## 2017-12-25 DIAGNOSIS — J9611 Chronic respiratory failure with hypoxia: Secondary | ICD-10-CM | POA: Insufficient documentation

## 2017-12-25 DIAGNOSIS — I714 Abdominal aortic aneurysm, without rupture: Secondary | ICD-10-CM | POA: Insufficient documentation

## 2017-12-25 LAB — CBC WITH DIFFERENTIAL/PLATELET
BASOS PCT: 0 %
Basophils Absolute: 0 10*3/uL (ref 0.0–0.1)
Eosinophils Absolute: 0.1 10*3/uL (ref 0.0–0.7)
Eosinophils Relative: 2 %
HEMATOCRIT: 31.8 % — AB (ref 39.0–52.0)
Hemoglobin: 10.2 g/dL — ABNORMAL LOW (ref 13.0–17.0)
LYMPHS PCT: 13 %
Lymphs Abs: 0.9 10*3/uL (ref 0.7–4.0)
MCH: 27.3 pg (ref 26.0–34.0)
MCHC: 32.1 g/dL (ref 30.0–36.0)
MCV: 85.3 fL (ref 78.0–100.0)
Monocytes Absolute: 0.9 10*3/uL (ref 0.1–1.0)
Monocytes Relative: 13 %
NEUTROS ABS: 4.9 10*3/uL (ref 1.7–7.7)
Neutrophils Relative %: 72 %
PLATELETS: 160 10*3/uL (ref 150–400)
RBC: 3.73 MIL/uL — AB (ref 4.22–5.81)
RDW: 15.5 % (ref 11.5–15.5)
WBC: 6.9 10*3/uL (ref 4.0–10.5)

## 2017-12-25 LAB — BASIC METABOLIC PANEL
ANION GAP: 8 (ref 5–15)
BUN: 22 mg/dL (ref 8–23)
CO2: 25 mmol/L (ref 22–32)
Calcium: 8.8 mg/dL — ABNORMAL LOW (ref 8.9–10.3)
Chloride: 103 mmol/L (ref 98–111)
Creatinine, Ser: 0.85 mg/dL (ref 0.61–1.24)
GLUCOSE: 96 mg/dL (ref 70–99)
POTASSIUM: 3.8 mmol/L (ref 3.5–5.1)
Sodium: 136 mmol/L (ref 135–145)

## 2017-12-27 ENCOUNTER — Non-Acute Institutional Stay (SKILLED_NURSING_FACILITY): Payer: Medicare Other | Admitting: Internal Medicine

## 2017-12-27 DIAGNOSIS — E871 Hypo-osmolality and hyponatremia: Secondary | ICD-10-CM | POA: Diagnosis not present

## 2017-12-27 DIAGNOSIS — R300 Dysuria: Secondary | ICD-10-CM

## 2017-12-27 DIAGNOSIS — I5023 Acute on chronic systolic (congestive) heart failure: Secondary | ICD-10-CM

## 2017-12-27 NOTE — Progress Notes (Signed)
This is an acute visit.  Level care skilled.  Facility is [facility].  Chief complaint-acute visit secondary to dysuria.  History of present illness.  Patient is a pleasant 84 year old male who is here for therapy after hospitalization for acute on chronic systolic CHF and hyponatremia.  He also has a history of symptomatic bradycardia because of a Mobitz type II AV block status post Saint Jude PPM placement-in addition to GERD abdominal aortic aneurysm as well as dilated cardiomyopathy ejection fraction of 70% with systolic CHF.  He does continue on Lasix 40 mg a day with potassium supplementation and his weight appears to be relatively stable as she is lost about 2 pounds since admission- edema appears to be relatively at baseline.  He does not complain of shortness of breath.  However he is complaining of some burning with urination.  He also complains at times of frequent bowel movements but apparently these are not really diarrhea.  He does not really complain of any abdominal pain but does complain of some burning with urination.  Vital signs appear to be stable he does have some chronic lower blood pressures in the 90s low 100s- but this is been long-term and largely asymptomatic  Regards to hyponatremia this has improved with fluid restriction last sodium was 136 on lab done on August 15  Past Medical History:  Diagnosis Date  . AAA (abdominal aortic aneurysm) ([place])   . Arthritis   . Chronic combined systolic (congestive) and diastolic (congestive) heart failure ([place])    a. 10/2017: echo showing a reduced EF of 25%, diffuse HK with akinesis of the apical myocardium, Grade 2 DD, mild AI, mild to moderate MR, and mild to moderate TR  . GERD (gastroesophageal reflux disease)   . Heart block AV second degree    St. Jude pacemaker May 2017 - Dr. [name]  . Infection of bladder   . Joint pain   . Symptomatic bradycardia         Past Surgical History:    Procedure Laterality Date  . BACK SURGERY    . CATARACT EXTRACTION W/ INTRAOCULAR LENS  IMPLANT, BILATERAL Bilateral   . EP IMPLANTABLE DEVICE N/A 10/02/2015   Procedure: Pacemaker Implant;  Surgeon: Evans Lance, MD;  Location: [facility];  Service: Cardiovascular;  Laterality: N/A;  . EXCISIONAL HEMORRHOIDECTOMY    . HEMORROIDECTOMY    . INGUINAL HERNIA REPAIR Bilateral 1991  . INSERT / REPLACE / REMOVE PACEMAKER    . JOINT REPLACEMENT    . [place] SURGERY  2005  . TOTAL KNEE ARTHROPLASTY Bilateral 1998 -  2003  . TOTAL SHOULDER REPLACEMENT Left 1999   Partial  . TYMPANOSTOMY TUBE PLACEMENT Right     reports that he quit smoking about 67 years ago. His smoking use included cigarettes. He has a 15.00 pack-year smoking history. He has never used smokeless tobacco. He reports that he does not drink alcohol or use drugs. Social History        Socioeconomic History  . Marital status: Married    Spouse name: Not on file  . Number of children: Not on file  . Years of education: Not on file  . Highest education level: Not on file  Occupational History  . Not on file  Social Needs  . Financial resource strain: Not on file  . Food insecurity:    Worry: Not on file    Inability: Not on file  . Transportation needs:  Medical: Not on file    Non-medical: Not on file  Tobacco Use  . Smoking status: Former Smoker    Packs/day: 1.00    Years: 15.00    Pack years: 15.00    Types: Cigarettes    Last attempt to quit: 05/13/1950    Years since quitting: 67.6  . Smokeless tobacco: Never Used  Substance and Sexual Activity  . Alcohol use: No    Alcohol/week: 0.0 standard drinks  . Drug use: No  . Sexual activity: Never  Lifestyle  . Physical activity:    Days per week: Not on file    Minutes per session: Not on file  . Stress: Not on file  Relationships  . Social connections:    Talks on phone: Not on file    Gets  together: Not on file    Attends religious service: Not on file    Active member of club or organization: Not on file    Attends meetings of clubs or organizations: Not on file    Relationship status: Not on file  . Intimate partner violence:    Fear of current or ex partner: Not on file    Emotionally abused: Not on file    Physically abused: Not on file    Forced sexual activity: Not on file  Other Topics Concern  . Not on file  Social History Narrative  . Not on file    Functional Status Survey:       Family History  Problem Relation Age of Onset  . Heart disease Father        NOT  before age 90  . Pneumonia Mother   . Cystic fibrosis Paternal Aunt   . Kidney disease Neg Hx         Health Maintenance  Topic Date Due  . INFLUENZA VACCINE  01/19/2018 (Originally 12/11/2017)  . TETANUS/TDAP  01/19/2018 (Originally 05/12/1938)  . PNA vac Low Risk Adult (2 of 2 - PCV13) 01/19/2018 (Originally 08/29/2016)        Allergies  Allergen Reactions  . Percocet [Oxycodone-Acetaminophen] Hives  . Noroxin [Norfloxacin] Nausea And Vomiting  . Celebrex [Celecoxib] Rash    Review of systems.  General is not complaining of any fever chills weight appears to be stable to slight weight loss.  Skin is not complaining of rashes or itching.  Head ears eyes nose mouth and throat is not complain of visual changes he has prescription lenses does not complain of sore throat.  Respiratory is not really complaining of shortness of breath or cough today.  Cardiac does not complain of chest pain appears to have some moderate baseline lower extremity edema.  GI is not complaining of abdominal discomfort at this point think he has some frequent bowel movements but says has gotten better apparently this is not true diarrhea  GU is complaining of some burning with urination.  Musculoskeletal at this point is not complain of joint pain continues to have weakness  however especially lower extremities.  Neurologic does not complain of dizziness headache or syncope at this time does have weakness.  Psych does not complain of being depressed or anxious appears to be in good spirits  Physical exam.  Temperature is 97.3 pulse 90 respirations 18 blood pressure 97/66  In general this is a pleasant elderly male in no distress resting comfortably in bed.  His skin is warm and dry.  Eyes visual acuity appears grossly intact he has prescription lenses sclera and conjunctive are  clear.  Oropharynx is clear mucous membranes moist.  Chest is clear to auscultation there is no labored breathing.  Heart is regular rate and rhythm with a systolic murmur- he has moderate lower extremity edema bilaterally.  Abdomen is soft nontender with positive bowel sounds.  GU could not really appreciate suprapubic tenderness or distention I could not appreciate any drainage from the penis   Musculoskeletal does move all extremities x4 but does have some lower extremity weakness which is baseline recently.  No changes other than arthritic.  Neurologic is grossly intact his speech is clear no lateralizing findings.  Psych he remains alert and oriented pleasant and appropriate  Labs.  December 25, 2017.  Sodium 136 potassium 3.8 BUN 22 creatinine 0.85.  WBC 6.9 hemoglobin 10.2 platelets 160.  Assessment and plan.  1.-Dysuria- physical exam was fairly benign will obtain a UA C&S.  2.  History of systolic CHF-his weight appears to be stable to slightly reduced- he is not complaining of shortness of breath at this point continue to monitor-appears to be relatively stable.--Continues on Lasix with potassium  3.  History of hyponatremia this has improved with sodium of 136 on lab done earlier this week-will update this next week.  [id number] note greater than 25 minutes spent assessing patient-discussing his status with nursing staff- and coordinating and  formulating a plan of care- of note greater than 50% of time spent coordinating plan of care with input as noted above

## 2017-12-28 ENCOUNTER — Encounter (HOSPITAL_COMMUNITY)
Admission: RE | Admit: 2017-12-28 | Discharge: 2017-12-28 | Disposition: A | Discharge status: Home / Self Care | Payer: Medicare Other | Source: Skilled Nursing Facility | Attending: *Deleted | Admitting: *Deleted

## 2017-12-28 ENCOUNTER — Encounter: Payer: Self-pay | Admitting: Internal Medicine

## 2017-12-28 LAB — URINALYSIS, ROUTINE W REFLEX MICROSCOPIC
BILIRUBIN URINE: NEGATIVE
Bacteria, UA: NONE SEEN
Glucose, UA: NEGATIVE mg/dL
Hgb urine dipstick: NEGATIVE
KETONES UR: NEGATIVE mg/dL
NITRITE: NEGATIVE
PH: 6 (ref 5.0–8.0)
Protein, ur: NEGATIVE mg/dL
Specific Gravity, Urine: 1.016 (ref 1.005–1.030)
WBC, UA: >50 WBC/hpf — ABNORMAL HIGH (ref 0–5)

## 2017-12-29 ENCOUNTER — Encounter: Payer: Self-pay | Admitting: Internal Medicine

## 2017-12-29 ENCOUNTER — Ambulatory Visit (INDEPENDENT_AMBULATORY_CARE_PROVIDER_SITE_OTHER): Payer: Medicare Other | Admitting: Internal Medicine

## 2017-12-29 VITALS — BP 110/60 | HR 77 | Ht 67.0 in | Wt 153.0 lb

## 2017-12-29 DIAGNOSIS — Z79899 Other long term (current) drug therapy: Secondary | ICD-10-CM

## 2017-12-29 DIAGNOSIS — I441 Atrioventricular block, second degree: Secondary | ICD-10-CM

## 2017-12-29 LAB — CUP PACEART INCLINIC DEVICE CHECK
Battery Voltage: 2.99 V
Brady Statistic RA Percent Paced: 2.8 %
Brady Statistic RV Percent Paced: 99.31 %
Implantable Lead Implant Date: 20170522
Implantable Lead Location: 753859
Implantable Pulse Generator Implant Date: 20170522
Lead Channel Impedance Value: 375 Ohm
Lead Channel Impedance Value: 412.5 Ohm
Lead Channel Pacing Threshold Amplitude: 0.75 V
Lead Channel Pacing Threshold Amplitude: 0.75 V
Lead Channel Pacing Threshold Pulse Width: 0.5 ms
Lead Channel Sensing Intrinsic Amplitude: 8.2 mV
Lead Channel Setting Pacing Amplitude: 2 V
Lead Channel Setting Pacing Pulse Width: 0.5 ms
Lead Channel Setting Sensing Sensitivity: 2 mV
MDC IDC LEAD IMPLANT DT: 20170522
MDC IDC LEAD LOCATION: 753860
MDC IDC MSMT BATTERY REMAINING LONGEVITY: 100 mo
MDC IDC MSMT LEADCHNL RA PACING THRESHOLD AMPLITUDE: 0.75 V
MDC IDC MSMT LEADCHNL RA PACING THRESHOLD PULSEWIDTH: 0.5 ms
MDC IDC MSMT LEADCHNL RA SENSING INTR AMPL: 0.8 mV
MDC IDC MSMT LEADCHNL RV PACING THRESHOLD AMPLITUDE: 0.75 V
MDC IDC MSMT LEADCHNL RV PACING THRESHOLD PULSEWIDTH: 0.5 ms
MDC IDC MSMT LEADCHNL RV PACING THRESHOLD PULSEWIDTH: 0.5 ms
MDC IDC PG SERIAL: 7899864
MDC IDC SESS DTM: 20190819130518
MDC IDC SET LEADCHNL RV PACING AMPLITUDE: 2.5 V

## 2017-12-29 NOTE — Progress Notes (Signed)
HPI Shawn Frye reurns today for PPM followup. He is a pleasant elderly man with advanced age, CHB, who has been found to have severe LV dysfunction by echo. His advanced age makes him not a candidate for additional treatment. No Biv upgrade. He has been in the [facility] with CHF. He was placed on lasix. Unclear why no ACE inhibitor. He denies anginal symptoms.  Allergies  Allergen Reactions  . Percocet [Oxycodone-Acetaminophen] Hives  . Noroxin [Norfloxacin] Nausea And Vomiting  . Celebrex [Celecoxib] Rash     No current outpatient medications on file.   No current facility-administered medications for this visit.      Past Medical History:  Diagnosis Date  . AAA (abdominal aortic aneurysm) ([place])   . Arthritis   . Chronic combined systolic (congestive) and diastolic (congestive) heart failure ([place])    a. 10/2017: echo showing a reduced EF of 25%, diffuse HK with akinesis of the apical myocardium, Grade 2 DD, mild AI, mild to moderate MR, and mild to moderate TR  . GERD (gastroesophageal reflux disease)   . Heart block AV second degree    St. Jude pacemaker May 2017 - Dr. [name]  . Infection of bladder   . Joint pain   . Symptomatic bradycardia     ROS:   All systems reviewed and negative except as noted in the HPI.   Past Surgical History:  Procedure Laterality Date  . BACK SURGERY    . CATARACT EXTRACTION W/ INTRAOCULAR LENS  IMPLANT, BILATERAL Bilateral   . EP IMPLANTABLE DEVICE N/A 10/02/2015   Procedure: Pacemaker Implant;  Surgeon: Evans Lance, MD;  Location: [facility];  Service: Cardiovascular;  Laterality: N/A;  . EXCISIONAL HEMORRHOIDECTOMY    . HEMORROIDECTOMY    . INGUINAL HERNIA REPAIR Bilateral 1991  . INSERT / REPLACE / REMOVE PACEMAKER    . JOINT REPLACEMENT    . [place] SURGERY  2005  . TOTAL KNEE ARTHROPLASTY Bilateral 1998 -  2003  . TOTAL SHOULDER REPLACEMENT Left 1999   Partial  . TYMPANOSTOMY TUBE PLACEMENT Right       Family History  Problem Relation Age of Onset  . Heart disease Father        NOT  before age 68  . Pneumonia Mother   . Cystic fibrosis Paternal Aunt   . Kidney disease Neg Hx      Social History   Socioeconomic History  . Marital status: Married    Spouse name: Not on file  . Number of children: Not on file  . Years of education: Not on file  . Highest education level: Not on file  Occupational History  . Not on file  Social Needs  . Financial resource strain: Not on file  . Food insecurity:    Worry: Not on file    Inability: Not on file  . Transportation needs:    Medical: Not on file    Non-medical: Not on file  Tobacco Use  . Smoking status: Former Smoker    Packs/day: 1.00    Years: 15.00    Pack years: 15.00    Types: Cigarettes    Last attempt to quit: 05/13/1950    Years since quitting: 67.6  . Smokeless tobacco: Never Used  Substance and Sexual Activity  . Alcohol use: No    Alcohol/week: 0.0 standard drinks  . Drug use: No  . Sexual activity: Never  Lifestyle  . Physical activity:  Days per week: Not on file    Minutes per session: Not on file  . Stress: Not on file  Relationships  . Social connections:    Talks on phone: Not on file    Gets together: Not on file    Attends religious service: Not on file    Active member of club or organization: Not on file    Attends meetings of clubs or organizations: Not on file    Relationship status: Not on file  . Intimate partner violence:    Fear of current or ex partner: Not on file    Emotionally abused: Not on file    Physically abused: Not on file    Forced sexual activity: Not on file  Other Topics Concern  . Not on file  Social History Narrative  . Not on file     BP 110/60   Pulse 77   Ht 5\' 7"  (1.702 m)   Wt 153 lb (69.4 kg)   SpO2 95%   BMI 23.96 kg/m   Physical Exam:  Well appearing NAD HEENT: Unremarkable Neck:  No JVD, no thyromegally Lymphatics:  No  adenopathy Back:  No CVA tenderness Lungs:  Clear HEART:  Regular rate rhythm, no murmurs, no rubs, no clicks Abd:  soft, positive bowel sounds, no organomegally, no rebound, no guarding Ext:  2 plus pulses, no edema, no cyanosis, no clubbing Skin:  No rashes no nodules Neuro:  CN II through XII intact, motor grossly intact  EKG - none  DEVICE  Normal device function.  See PaceArt for details.   Assess/Plan: 1. CHB - s/p PM and stable.  2. PPM - his St. Jude DDD PM is working normally. 3. Chronic systolic heart failure - he is class 3. I have recommended her start lisinopril 2.5 mg daily. He will need a bmp in a week. I'll have him followup with Dr. Domenic Polite in 4 weeks.   Mikle [email or web address].D.

## 2017-12-29 NOTE — Patient Instructions (Signed)
Medication Instructions:  Your physician recommends that you continue on your current medications as directed. Please refer to the Current Medication list given to you today.   Labwork: NONE   Testing/Procedures: NONE   Follow-Up: Your physician wants you to follow-up in: 1 Year. You will receive a reminder letter in the mail two months in advance. If you don't receive a letter, please call our office to schedule the follow-up appointment.   Any Other Special Instructions Will Be Listed Below (If Applicable).     If you need a refill on your cardiac medications before your next appointment, please call your pharmacy.  Thank you for choosing [facility]!   

## 2017-12-30 ENCOUNTER — Other Ambulatory Visit (HOSPITAL_COMMUNITY): Payer: Medicare Other

## 2017-12-30 ENCOUNTER — Other Ambulatory Visit: Payer: Self-pay | Admitting: *Deleted

## 2017-12-30 ENCOUNTER — Encounter: Payer: Self-pay | Admitting: *Deleted

## 2017-12-30 ENCOUNTER — Encounter: Payer: Self-pay | Admitting: Internal Medicine

## 2017-12-30 ENCOUNTER — Ambulatory Visit: Payer: Medicare Other | Admitting: Family

## 2017-12-30 ENCOUNTER — Non-Acute Institutional Stay (SKILLED_NURSING_FACILITY): Payer: Medicare Other | Admitting: Internal Medicine

## 2017-12-30 DIAGNOSIS — E871 Hypo-osmolality and hyponatremia: Secondary | ICD-10-CM | POA: Diagnosis not present

## 2017-12-30 DIAGNOSIS — I5043 Acute on chronic combined systolic (congestive) and diastolic (congestive) heart failure: Secondary | ICD-10-CM | POA: Diagnosis not present

## 2017-12-30 DIAGNOSIS — I959 Hypotension, unspecified: Secondary | ICD-10-CM | POA: Diagnosis not present

## 2017-12-30 DIAGNOSIS — N390 Urinary tract infection, site not specified: Secondary | ICD-10-CM | POA: Diagnosis not present

## 2017-12-30 LAB — URINE CULTURE: Culture: >=100000 — AB

## 2017-12-30 NOTE — Progress Notes (Signed)
Location:   [facility] Room Number: [phone] Place of Service:  SNF [phone]) Provider:  Cory Roughen, MD  Patient Care Team: [name], MD as PCP - General (Internal Medicine) Satira Sark, MD as PCP - Cardiology (Cardiology) Franchot Gallo, MD (Urology) Neldon Labella, RN as [place] Management Standley Brooking, LCSW as [place] Management  Extended Emergency Contact Information Primary Emergency Contact: strickland,ginger Mobile Phone: [phone] Relation: Niece Secondary Emergency Contact: Thurston Pounds Mobile Phone: [phone] Relation: Other  Code Status:  Full Code Goals of care: Advanced Directive information Advanced Directives 12/22/2017  Does Patient Have a Medical Advance Directive? Yes  Type of Advance Directive (No Data)  Does patient want to make changes to medical advance directive? No - Patient declined  Copy of Schleicher in Chart? No - copy requested  Would patient like information on creating a medical advance directive? -    Chief complaint-acute visit secondary to UTI   HPI:  Pt is a 84 y.o. Frye seen today for an acute visit for UTI.  He is  here for therapy after hospitalization for acute on chronic systolic CHF and hyponatremia.  He also has a history of symptomatic bradycardia because of a Mobitz type II AV block status post Saint Jude PPM placement-in addition to GERD abdominal aortic aneurysm as well as dilated cardiomyopathy ejection fraction of 00% with systolic CHF.  He does continue on Lasix 40 mg a day with potassium supplementation  He actually saw cardiology yesterday and thought to be doing well.  They did start him on low-dose lisinopril 2.5 mg a day.  This was discussed with Dr. Lyndel Safe and at this point will discontinue lisinopril because his blood pressures tend to run low often with systolics under 923- I got 92/56 this  evening.  He is not symptomatic does not complain of dizziness or syncope  Regards to possible urinary tract infection he did complain of dysuria when I saw him this weekend and we ordered a urine culture which has come back showing greater than 100,000 colonies of enterococcus faecalis--sensitive to ampicillin Levaquin nitrofurantoin and vancomycin.  He actually says his dysuria is somewhat better today-vital signs appear to be stable again his blood pressure is somewhat low but he is asymptomatic    Past Medical History:  Diagnosis Date  . AAA (abdominal aortic aneurysm) ([place])   . Arthritis   . Chronic combined systolic (congestive) and diastolic (congestive) heart failure ([place])    a. 10/2017: echo showing a reduced EF of 25%, diffuse HK with akinesis of the apical myocardium, Grade 2 DD, mild AI, mild to moderate MR, and mild to moderate TR  . GERD (gastroesophageal reflux disease)   . Heart block AV second degree    St. Jude pacemaker May 2017 - Dr. [name]  . Infection of bladder   . Joint pain   . Symptomatic bradycardia    Past Surgical History:  Procedure Laterality Date  . BACK SURGERY    . CATARACT EXTRACTION W/ INTRAOCULAR LENS  IMPLANT, BILATERAL Bilateral   . EP IMPLANTABLE DEVICE N/A 10/02/2015   Procedure: Pacemaker Implant;  Surgeon: Evans Lance, MD;  Location: [facility];  Service: Cardiovascular;  Laterality: N/A;  . EXCISIONAL HEMORRHOIDECTOMY    . HEMORROIDECTOMY    . INGUINAL HERNIA REPAIR Bilateral 1991  . INSERT / REPLACE / REMOVE PACEMAKER    . JOINT REPLACEMENT    .  [place] SURGERY  2005  . TOTAL KNEE ARTHROPLASTY Bilateral 1998 -  2003  . TOTAL SHOULDER REPLACEMENT Left 1999   Partial  . TYMPANOSTOMY TUBE PLACEMENT Right     Allergies  Allergen Reactions  . Percocet [Oxycodone-Acetaminophen] Hives  . Noroxin [Norfloxacin] Nausea And Vomiting  . Celebrex [Celecoxib] Rash    Outpatient Encounter Medications as of 12/30/2017   Medication Sig  . ferrous sulfate 325 (65 FE) MG EC tablet Take 1 tablet by mouth daily.  . furosemide (LASIX) 40 MG tablet Take 1 tablet (40 mg total) by mouth daily.  Marland Kitchen lisinopril (PRINIVIL,ZESTRIL) 2.5 MG tablet Take 2.5 mg by mouth daily.  . potassium chloride SA (K-DUR,KLOR-CON) 20 MEQ tablet Take 2 tablets (40 mEq total) by mouth daily.  . psyllium (METAMUCIL SMOOTH TEXTURE) 28 % packet Take 1 packet by mouth every morning.   . ranitidine (ZANTAC) 150 MG tablet Take 150 mg by mouth at bedtime.   No facility-administered encounter medications on file as of 12/30/2017.     Review of Systems   General is not complaining of any fever or chills.  Skin does not complain of rashes itching or diaphoresis.  Head ears eyes nose mouth and throat is not complaining of sore throat or difficulty swallowing he does have prescription lenses.  Respiratory does not complain of shortness of breath or cough.  Heart  does not complain of chest pain has quite mild lower extremity edema.  GI is not complaining of abdominal discomfort nausea vomiting diarrhea or constipation-at one point had complained of frequent bowel movements but he says this has improved.  GU says he still has some burning with urination but it is better than over the weekend  Musculoskeletal is not complaining of joint pain at this time.  Neurologic does not complain of dizziness headache or syncope.  Psych continues to be in good spirits he is bright alert does not complain of being overtly depressed or anxious-says he has lined up someone to be with him when he is discharged and appears to be happy about this    Immunization History  Administered Date(s) Administered  . Influenza-Unspecified 02/10/2017  . Pneumococcal Polysaccharide-23 08/30/2015   Pertinent  Health Maintenance Due  Topic Date Due  . INFLUENZA VACCINE  01/19/2018 (Originally 12/11/2017)  . PNA vac Low Risk Adult (2 of 2 - PCV13) 01/19/2018 (Originally  08/29/2016)   No flowsheet data found. Functional Status Survey:      Physical Exam  Wt: 147.8lb, BP 87/56, P:81, T:97.0, RR: 18, SpO2: 95%, HT:5'7" Of note manual blood pressure was 92/56  Weight appears relatively stable at 147.8 pounds  General this is a pleasant elderly Frye in no distress sitting comfortably in his wheelchair.  His skin is warm and dry.  Oropharynx clear mucous membranes moist.  Eyes he has prescription lenses visual acuity appears grossly intact.  Chest is clear to auscultation with somewhat shallow air entry.  Heart is regular rate and rhythm without murmur gallop or rub he has mild lower extremity edema.  Abdomen is soft nontender with positive bowel sounds.  GU could not really appreciate suprapubic tenderness on exam today but exam was limited because of patient positioning in wheelchair.  Musculoskeletal is able to move all extremities x4 at baseline he is ambulatory in a wheelchair.  Neurologic is grossly intact his speech is clear no lateralizing findings  Psych he is alert and oriented pleasant and appropriate   Labs reviewed: Recent Labs  12/15/17 0412 12/16/17 0418  12/19/17 0430 12/22/17 0600 12/25/17 0706  NA 118* 119*   < > 124* 131* 136  K 3.6 3.9   < > 4.3 3.9 3.8  CL 81* 83*   < > 88* 97* 103  CO2 28 27   < > 28 25 25   GLUCOSE 112* 109*   < > 99 108* 96  BUN 21 19   < > 19 20 22   CREATININE 0.95 0.83   < > 0.91 0.88 0.85  CALCIUM 8.4* 8.4*   < > 8.6* 8.8* 8.8*  MG 1.7 1.9  --   --   --   --    < > = values in this interval not displayed.   Recent Labs    12/13/17 1519  AST 27  ALT 14  ALKPHOS 100  BILITOT 2.0*  PROT 7.4  ALBUMIN 3.6   Recent Labs    12/13/17 1519 12/19/17 0430 12/25/17 0706  WBC 11.8* 8.7 6.9  NEUTROABS 9.0* 6.0 4.9  HGB 10.8* 10.2* 10.2*  HCT 32.3* 30.7* 31.8*  MCV 85.0 84.3 85.3  PLT 185 140* 160   No results found for: TSH No results found for: HGBA1C No results found for: CHOL,  HDL, LDLCALC, LDLDIRECT, TRIG, CHOLHDL  Significant Diagnostic Results in last 30 days:  Dg Chest 2 View  Result Date: 12/13/2017 CLINICAL DATA:  Shortness of breath EXAM: CHEST - 2 VIEW COMPARISON:  12/04/2017 FINDINGS: Cardiac enlargement is again seen. Pacing device is noted. Lungs are well aerated with small right pleural effusion and right basilar atelectasis stable from the prior exam. Very mild vascular congestion is noted. Postsurgical changes in left shoulder are seen. IMPRESSION: Mild vascular congestion with right basilar changes stable from the prior exam. Electronically Signed   By: Inez Catalina M.D.   On: 12/13/2017 15:31   Dg Chest 2 View  Result Date: 12/04/2017 CLINICAL DATA:  Shortness of breath.  Cough. EXAM: CHEST - 2 VIEW COMPARISON:  07/04/2017 FINDINGS: Left subclavian approach pacemaker remains in place with leads terminating over the right atrium and right ventricle. The cardiac silhouette is borderline to mildly enlarged. Thoracic aortic atherosclerotic calcification and tortuosity are noted. Lung volumes are lower than on the prior study with pulmonary vascular congestion and mild diffuse increase in the interstitial markings bilaterally. There are new small bilateral pleural effusions. No pneumothorax is identified. Prior left shoulder arthroplasty and severe right glenohumeral osteoarthrosis are noted. IMPRESSION: Mild interstitial edema and small bilateral pleural effusions. Electronically Signed   By: Logan Bores M.D.   On: 12/04/2017 13:47    Assessment/Plan  #1 UTI positive for enterococcus- continues to have some symptoms but actually says has improved over the weekend- will start him on ampicillan 250 mg TID x 7 days days and monitor also will start him on probiotic twice daily for 7 days.  2.  History of systolic CHF-weight appears to be stable he actually saw cardiology yesterday and thought to be doing well- he is on Lasix 40 mg a day recommendation was to start  low-dose lisinopril with his history of CHF-however blood pressures are running low-this was discussed with Dr. Lyndel Safe and will discontinue the lisinopril because of this   Continue to monitor.  3.  Hypotension again he does have somewhat low blood pressures but this appears to be well tolerated at this point continue to monitor.  4.  Hyponatremia- this has shown improvement again most recent sodium was 136 last week-update metabolic  panel scheduled for August 26      [id number]-of note greater than 25 minutes spent assessing patient- reviewing his chart and labs-discussing status with Dr.Guptaand coordinating and formulating a plan of care- of note greater than 50% of time spent coordinating a plan of care

## 2017-12-30 NOTE — Patient Outreach (Signed)
[facility]) Care Management  12/30/2017  Shawn Frye [facility] [birth year] [id number]   Onsite IDT meeting. SW reports patient wants to go home with more private pay help instead of going to ALF.  Patient wife is still at facility under private pay and will stay for a while longer for patient to get home and get stronger. Then he wants her to come back home.   Met with patient in his room at facility. Patient had just gotten back from PT.  Patient reports he saw Dr. [name] yesterday who ordered a medication for "6 days" to help with patient breathing. He states he plans to stay at facility until he has finished medication.  Patient reports that Thurston Pounds has POA for both he and his wife for financial matters. She assists with finances and paying bills. Also, he reports that [facility] is looking at getting someone to care for both him and wife 8 hours a day 5 days a week.   RNCM reviewed [facility] care management to patient. Patient verbally agrees for both he and his wife. He does want RNCM to speak with [facility] and review [facility] services, he does not have her number but RNCM left contact information and requested that [facility] call RNCM.  Plan to make a referral to [facility] LCSW to follow at facility and for [facility] for transition of care after discharge.  Royetta Crochet. Laymond Purser, RN, BSN, [place] [phone]) Business Cell  [phone]) Toll Free Office

## 2017-12-31 ENCOUNTER — Ambulatory Visit (INDEPENDENT_AMBULATORY_CARE_PROVIDER_SITE_OTHER): Payer: Medicare Other | Admitting: Orthopaedic Surgery

## 2018-01-01 ENCOUNTER — Non-Acute Institutional Stay (SKILLED_NURSING_FACILITY): Payer: Medicare Other | Admitting: Internal Medicine

## 2018-01-01 ENCOUNTER — Encounter: Payer: Self-pay | Admitting: Internal Medicine

## 2018-01-01 ENCOUNTER — Other Ambulatory Visit: Payer: Self-pay | Admitting: *Deleted

## 2018-01-01 DIAGNOSIS — E871 Hypo-osmolality and hyponatremia: Secondary | ICD-10-CM

## 2018-01-01 DIAGNOSIS — I420 Dilated cardiomyopathy: Secondary | ICD-10-CM

## 2018-01-01 DIAGNOSIS — I5023 Acute on chronic systolic (congestive) heart failure: Secondary | ICD-10-CM | POA: Diagnosis not present

## 2018-01-01 DIAGNOSIS — N390 Urinary tract infection, site not specified: Secondary | ICD-10-CM

## 2018-01-01 NOTE — Patient Outreach (Signed)
[facility]) Care Management  01/01/2018  TROYCE GIESKE [birth year] [id number]   CSW received referral from [place] that patient was at [facility] SNF with plans to return home. CSW met with patient at SNF (room#: [id number]) along with his wife, Opal Sidles at bedside. Patient reports that he plans to return home this Saturday, Aug 24th with home health following. Patient's wife will be going to [facility]F "indefinitely". Patient reports that he has hired a lady to check on him twice a day and will be able to bring him to [place] to visit his wife and go grocery shopping if he is unable to drive himself. Patient reports that he does still have his drivers license and car, but his doctor has advised him not to drive at this time, but he hopes that he will lift the restriction once he returns home. CSW confirmed patient's home address & phone number ([place]) and will inform [facility] RNCM, Felecia who is assigned to patient of plans for him to return home Saturday.   CSW will follow-up with patient in 2 weeks.    Raynaldo Opitz, LCSW [facility]  Clinical Social Worker cell #: [phone]

## 2018-01-01 NOTE — Progress Notes (Signed)
Location:  [facility] Room Number: [phone] Place of Service:  SNF [phone])  Provider: Granville Lewis, PA-C  PCP: [name], MD Patient Care Team: [name], MD as PCP - General (Internal Medicine) Satira Sark, MD as PCP - Cardiology (Cardiology) Franchot Gallo, MD (Urology) Neldon Labella, RN as [facility] Standley Brooking, LCSW as [facility]  Extended Emergency Contact Information Primary Emergency Contact: strickland,ginger Mobile Phone: [phone] Relation: Niece Secondary Emergency Contact: Thurston Pounds Mobile Phone: [phone] Relation: Other  Code Status: Full Code Goals of care:  Advanced Directive information Advanced Directives 12/22/2017  Does Patient Have a Medical Advance Directive? Yes  Type of Advance Directive (No Data)  Does patient want to make changes to medical advance directive? No - Patient declined  Copy of [place] in Chart? No - copy requested  Would patient like information on creating a medical advance directive? -     Allergies  Allergen Reactions  . Percocet [Oxycodone-Acetaminophen] Hives  . Noroxin [Norfloxacin] Nausea And Vomiting  . Celebrex [Celecoxib] Rash    Chief Complaint  Patient presents with  . Discharge Note    Discharge from [facility]    HPI:  84 y.o. male seen today for discharge from facility later this week.  He was here for rehab after hospitalization for acute on chronic systolic CHF.   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  In the hospital he had diuresis- repeat echo showed an ejection fraction of 20% with diffuse hypokinesis.  Cardiac cath was not pursued because of his advanced age also did not tolerate beta-blocker or ACE inhibitors  because of low blood pressure.  He also had hyponatremia which improved with diuresis continues on fluid restrictions as well.  His stay here is been relatively unremarkable he has gained strength.  And will be going home but will have someone with him 8 hours a day.  His weight has been relatively stable at around 145 pounds on current dose of Lasix 40 mg a day.  He is not complaining of any increased shortness of breath or edema from baseline.  He did see cardiology recently-and thought to be doing well with his pacemaker as well as CHF- there was recommendation to possibly start an ACE inhibitor but his blood pressures have been low so this is not been initiated.  Blood pressure today manually was 94/60- he does have frequent systolics under 116 but appears to be tolerating this well he denies any dizziness even when he is up working with therapy.  Regards to hyponatremia this appears to be responding to the diuresis and fluid restrictions sodium was 136 on lab done on August 15.  He also has a history of anemia which appears stable on iron-hemoglobin was 10.2 on most recent lab.  He recently did complain of some dysuria we ordered a urine culture which did grow out enterococcus- he has been started on an antibiotic- and says that dysuria has improved.  He also has a history of abdominal aortic aneurysm which is followed by vascular last measurement was 5.6 cm in diameter.  Currently he is eating his lunch says he feels well does not really have any complaints is looking forward to his discharge--     Past Medical History:  Diagnosis  Date  . AAA (abdominal aortic aneurysm) ([place])   . Arthritis   . Chronic combined systolic (congestive) and diastolic (congestive) heart failure ([place])    a. 10/2017: echo showing a reduced EF of 25%, diffuse HK with akinesis of the apical myocardium, Grade 2 DD, mild AI, mild to moderate MR, and mild to moderate TR  . GERD (gastroesophageal reflux  disease)   . Heart block AV second degree    St. Jude pacemaker May 2017 - Dr. [name]  . Infection of bladder   . Joint pain   . Symptomatic bradycardia     Past Surgical History:  Procedure Laterality Date  . BACK SURGERY    . CATARACT EXTRACTION W/ INTRAOCULAR LENS  IMPLANT, BILATERAL Bilateral   . EP IMPLANTABLE DEVICE N/A 10/02/2015   Procedure: Pacemaker Implant;  Surgeon: Evans Lance, MD;  Location: [facility];  Service: Cardiovascular;  Laterality: N/A;  . EXCISIONAL HEMORRHOIDECTOMY    . HEMORROIDECTOMY    . INGUINAL HERNIA REPAIR Bilateral 1991  . INSERT / REPLACE / REMOVE PACEMAKER    . JOINT REPLACEMENT    . [place] SURGERY  2005  . TOTAL KNEE ARTHROPLASTY Bilateral 1998 -  2003  . TOTAL SHOULDER REPLACEMENT Left 1999   Partial  . TYMPANOSTOMY TUBE PLACEMENT Right       reports that he quit smoking about 67 years ago. His smoking use included cigarettes. He has a 15.00 pack-year smoking history. He has never used smokeless tobacco. He reports that he does not drink alcohol or use drugs. Social History   Socioeconomic History  . Marital status: Married    Spouse name: Not on file  . Number of children: Not on file  . Years of education: Not on file  . Highest education level: Not on file  Occupational History  . Not on file  Social Needs  . Financial resource strain: Not on file  . Food insecurity:    Worry: Not on file    Inability: Not on file  . Transportation needs:    Medical: Not on file    Non-medical: Not on file  Tobacco Use  . Smoking status: Former Smoker    Packs/day: 1.00    Years: 15.00    Pack years: 15.00    Types: Cigarettes    Last attempt to quit: 05/13/1950    Years since quitting: 67.6  . Smokeless tobacco: Never Used  Substance and Sexual Activity  . Alcohol use: No    Alcohol/week: 0.0 standard drinks  . Drug use: No  . Sexual activity: Never  Lifestyle  . Physical activity:    Days per week: Not on file     Minutes per session: Not on file  . Stress: Not on file  Relationships  . Social connections:    Talks on phone: Not on file    Gets together: Not on file    Attends religious service: Not on file    Active member of club or organization: Not on file    Attends meetings of clubs or organizations: Not on file    Relationship status: Not on file  . Intimate partner violence:    Fear of current or ex partner: Not on file    Emotionally abused: Not on file    Physically abused: Not on file    Forced sexual activity: Not on file  Other Topics Concern  . Not on file  Social History Narrative  . Not on  file   Functional Status Survey:    Allergies  Allergen Reactions  . Percocet [Oxycodone-Acetaminophen] Hives  . Noroxin [Norfloxacin] Nausea And Vomiting  . Celebrex [Celecoxib] Rash    Pertinent  Health Maintenance Due  Topic Date Due  . INFLUENZA VACCINE  01/19/2018 (Originally 12/11/2017)  . PNA vac Low Risk Adult (2 of 2 - PCV13) 01/19/2018 (Originally 08/29/2016)    Medications: Outpatient Encounter Medications as of 01/01/2018  Medication Sig  . amoxicillin (AMOXIL) 250 MG capsule Take 250 mg by mouth 3 (three) times daily.  . ferrous sulfate 325 (65 FE) MG EC tablet Take 1 tablet by mouth daily.  . furosemide (LASIX) 40 MG tablet Take 1 tablet (40 mg total) by mouth daily.  . potassium chloride SA (K-DUR,KLOR-CON) 20 MEQ tablet Take 2 tablets (40 mEq total) by mouth daily.  . Probiotic Product (RISA-BID PROBIOTIC PO) Take one tablet by mouth twice daily until 01/06/18  . psyllium (METAMUCIL SMOOTH TEXTURE) 28 % packet Take 1 packet by mouth every morning.   . ranitidine (ZANTAC) 150 MG tablet Take 150 mg by mouth at bedtime.  Marland Kitchen lisinopril (PRINIVIL,ZESTRIL) 2.5 MG tablet Take 2.5 mg by mouth daily.   No facility-administered encounter medications on file as of 01/01/2018.     Review of Systems  In general is not complaining any fever chills weight appears to be  stable.  Skin is not complaining of rashes or itching or diaphoresis.  Head ears eyes nose mouth and throat is not complaining of any sore throat or visual changes.  Respiratory denies shortness of breath or cough.  Cardiac is not complaining of chest pain edema appears to be at baseline and mild.  GI is not complaining of abdominal discomfort nausea vomiting diarrhea constipation.  GU says it burns a little bit when he starts to urinate but has improved.  Musculoskeletal is not complaining of joint pain has gained strength.  Neurologic is not complaining of dizziness headache or numbness.  Psych does not complain of depression or anxiety continues to be in good spirits     Physical Exam Wt: 145.2, BP: 84/54, P: 78, RR: 20, T: 97.7, Spo2: 97%--of note manual blood pressure was 94/60   In general this is a pleasant elderly male in no distress sitting comfortably in his wheelchair.  His skin is warm and dry.  Oropharynx is clear mucous membranes moist.  Chest is clear to auscultation there is no labored breathing.  Heart is regular rate and rhythm without murmur gallop or rub he has mild lower extremity edema.  His abdomen is soft nontender with positive bowel sounds.  GU could not really appreciate suprapubic tenderness  Musculoskeletal moves all extremities x4 it appears at baseline at times will use his walker appears to have gained strength.  Neurologic is grossly intact his speech is clear no lateralizing findings.  Psych he is alert and oriented pleasant and appropriate Labs reviewed: Basic Metabolic Panel: Recent Labs    12/15/17 0412 12/16/17 0418  12/19/17 0430 12/22/17 0600 12/25/17 0706  NA 118* 119*   < > 124* 131* 136  K 3.6 3.9   < > 4.3 3.9 3.8  CL 81* 83*   < > 88* 97* 103  CO2 28 27   < > 28 25 25   GLUCOSE 112* 109*   < > 99 108* 96  BUN 21 19   < > 19 20 22   CREATININE 0.95 0.83   < > 0.91 0.88 0.85  CALCIUM  8.4* 8.4*   < > 8.6* 8.8* 8.8*   MG 1.7 1.9  --   --   --   --    < > = values in this interval not displayed.   Liver Function Tests: Recent Labs    12/13/17 1519  AST 27  ALT 14  ALKPHOS 100  BILITOT 2.0*  PROT 7.4  ALBUMIN 3.6   No results for input(s): LIPASE, AMYLASE in the last 8760 hours. No results for input(s): AMMONIA in the last 8760 hours. CBC: Recent Labs    12/13/17 1519 12/19/17 0430 12/25/17 0706  WBC 11.8* 8.7 6.9  NEUTROABS 9.0* 6.0 4.9  HGB 10.8* 10.2* 10.2*  HCT 32.3* 30.7* 31.8*  MCV 85.0 84.3 85.3  PLT 185 140* 160   Cardiac Enzymes:  Recent Labs    12/13/17 1519  TROPONINI <0.03   BNP: Invalid input(s): POCBNP CBG: No results for input(s): GLUCAP in the last 8760 hours.  Procedures and Imaging Studies During Stay: Dg Chest 2 View  Result Date: 12/13/2017 CLINICAL DATA:  Shortness of breath EXAM: CHEST - 2 VIEW COMPARISON:  12/04/2017 FINDINGS: Cardiac enlargement is again seen. Pacing device is noted. Lungs are well aerated with small right pleural effusion and right basilar atelectasis stable from the prior exam. Very mild vascular congestion is noted. Postsurgical changes in left shoulder are seen. IMPRESSION: Mild vascular congestion with right basilar changes stable from the prior exam. Electronically Signed   By: Inez Catalina M.D.   On: 12/13/2017 15:31   Dg Chest 2 View  Result Date: 12/04/2017 CLINICAL DATA:  Shortness of breath.  Cough. EXAM: CHEST - 2 VIEW COMPARISON:  07/04/2017 FINDINGS: Left subclavian approach pacemaker remains in place with leads terminating over the right atrium and right ventricle. The cardiac silhouette is borderline to mildly enlarged. Thoracic aortic atherosclerotic calcification and tortuosity are noted. Lung volumes are lower than on the prior study with pulmonary vascular congestion and mild diffuse increase in the interstitial markings bilaterally. There are new small bilateral pleural effusions. No pneumothorax is identified. Prior left  shoulder arthroplasty and severe right glenohumeral osteoarthrosis are noted. IMPRESSION: Mild interstitial edema and small bilateral pleural effusions. Electronically Signed   By: Logan Bores M.D.   On: 12/04/2017 13:47    Assessment/Plan:    #1 systolic CHF with ejection fraction 20%-she appears to be doing well on current dose of Lasix- edema is baseline he is not complaining of shortness of breath- does have follow-up with cardiology- will update labs next week have home health to draw and notify PCP of results.  2.  History of dilated cardiomyopathy again not a candidate for aggressive work-up because of his advanced age but appears to be stable.  3.  History of cardiac pacemaker this is followed by cardiology and per recent visit thought to be stable.  4.  History of abdominal aortic aneurysm-again this is followed by vascular last diameter was 5.6 cm.  5.-History of hyponatremia he appears to be bonding well to the fluid restrictions as well as the diuresis-this will need updating next week as well and primary care provider notified of results.  6.  History of anemia this appears stable with a hemoglobin 10.2 on lab done earlier this week will have this updated next week as well.  7.  UTI- his symptoms appear to be improving- he has been started on amoxicillin and symptoms appear to be improving  #8 hypotensionshe appears to be asymptomatic again not a candidate for  an ACE inhibitor or beta-blocker because of low blood pressures but this appears to be well tolerated but will need follow-up as an outpatient  He again he will be going home he does have someone with him significant part of the day-he will need continued PT and OT for strengthening and expedient primary care follow-up  [id number]-of note greater than 30 minutes spent on this discharge summary- greater than 50% of time spent coordinating a plan of care for numerous diagnoses     Patient has been advised to f/u with  their PCP in 1-2 weeks to for a transitions of care visit.  Social services at their facility was responsible for arranging this appointment.  Pt was provided with adequate prescriptions of noncontrolled medications to reach the scheduled appointment .  For controlled substances, a limited supply was provided as appropriate for the individual patient.  If the pt normally receives these medications from a pain clinic or has a contract with another physician, these medications should be received from that clinic or physician only).    Future labs/tests needed: CBC and metabolic panel be drawn by home health next week primary care provider notified of results

## 2018-01-04 DIAGNOSIS — I442 Atrioventricular block, complete: Secondary | ICD-10-CM | POA: Diagnosis not present

## 2018-01-04 DIAGNOSIS — I5043 Acute on chronic combined systolic (congestive) and diastolic (congestive) heart failure: Secondary | ICD-10-CM | POA: Diagnosis not present

## 2018-01-04 DIAGNOSIS — I420 Dilated cardiomyopathy: Secondary | ICD-10-CM | POA: Diagnosis not present

## 2018-01-04 DIAGNOSIS — K219 Gastro-esophageal reflux disease without esophagitis: Secondary | ICD-10-CM | POA: Diagnosis not present

## 2018-01-04 DIAGNOSIS — E871 Hypo-osmolality and hyponatremia: Secondary | ICD-10-CM | POA: Diagnosis not present

## 2018-01-04 DIAGNOSIS — J9601 Acute respiratory failure with hypoxia: Secondary | ICD-10-CM | POA: Diagnosis not present

## 2018-01-04 DIAGNOSIS — Z5181 Encounter for therapeutic drug level monitoring: Secondary | ICD-10-CM | POA: Diagnosis not present

## 2018-01-04 DIAGNOSIS — Z87891 Personal history of nicotine dependence: Secondary | ICD-10-CM | POA: Diagnosis not present

## 2018-01-04 DIAGNOSIS — Z96612 Presence of left artificial shoulder joint: Secondary | ICD-10-CM | POA: Diagnosis not present

## 2018-01-04 DIAGNOSIS — D509 Iron deficiency anemia, unspecified: Secondary | ICD-10-CM | POA: Diagnosis not present

## 2018-01-04 DIAGNOSIS — Z96653 Presence of artificial knee joint, bilateral: Secondary | ICD-10-CM | POA: Diagnosis not present

## 2018-01-04 DIAGNOSIS — M1990 Unspecified osteoarthritis, unspecified site: Secondary | ICD-10-CM | POA: Diagnosis not present

## 2018-01-04 DIAGNOSIS — I714 Abdominal aortic aneurysm, without rupture: Secondary | ICD-10-CM | POA: Diagnosis not present

## 2018-01-04 DIAGNOSIS — Z950 Presence of cardiac pacemaker: Secondary | ICD-10-CM | POA: Diagnosis not present

## 2018-01-05 ENCOUNTER — Other Ambulatory Visit: Payer: Self-pay

## 2018-01-05 ENCOUNTER — Encounter (HOSPITAL_COMMUNITY)
Admission: RE | Admit: 2018-01-05 | Discharge: 2018-01-05 | Disposition: A | Discharge status: Home / Self Care | Payer: Medicare Other | Source: Skilled Nursing Facility | Attending: *Deleted | Admitting: *Deleted

## 2018-01-05 DIAGNOSIS — I420 Dilated cardiomyopathy: Secondary | ICD-10-CM | POA: Diagnosis not present

## 2018-01-05 DIAGNOSIS — E871 Hypo-osmolality and hyponatremia: Secondary | ICD-10-CM | POA: Diagnosis not present

## 2018-01-05 DIAGNOSIS — I5043 Acute on chronic combined systolic (congestive) and diastolic (congestive) heart failure: Secondary | ICD-10-CM | POA: Diagnosis not present

## 2018-01-05 DIAGNOSIS — J9601 Acute respiratory failure with hypoxia: Secondary | ICD-10-CM | POA: Diagnosis not present

## 2018-01-05 DIAGNOSIS — I442 Atrioventricular block, complete: Secondary | ICD-10-CM | POA: Diagnosis not present

## 2018-01-05 DIAGNOSIS — I714 Abdominal aortic aneurysm, without rupture: Secondary | ICD-10-CM | POA: Diagnosis not present

## 2018-01-05 DIAGNOSIS — I5042 Chronic combined systolic (congestive) and diastolic (congestive) heart failure: Secondary | ICD-10-CM | POA: Diagnosis not present

## 2018-01-05 NOTE — Patient Outreach (Signed)
[facility]) Care Management  01/05/2018  Deshannon Hinchliffe [facility] [birth year] [id number]    Referral received from [facility] for transition of care services. Confirmation received from SNF that Mr. Schoch discharged over the weekend.    Initial outreach call placed. Outreach unsuccessful. RN CM left HIPAA compliant voice message requesting a return call.   PLAN Will send unsuccessful outreach letter. Will follow up in 3-4 business days.   [place] [phone]

## 2018-01-07 DIAGNOSIS — I442 Atrioventricular block, complete: Secondary | ICD-10-CM | POA: Diagnosis not present

## 2018-01-07 DIAGNOSIS — I714 Abdominal aortic aneurysm, without rupture: Secondary | ICD-10-CM | POA: Diagnosis not present

## 2018-01-07 DIAGNOSIS — I420 Dilated cardiomyopathy: Secondary | ICD-10-CM | POA: Diagnosis not present

## 2018-01-07 DIAGNOSIS — I5043 Acute on chronic combined systolic (congestive) and diastolic (congestive) heart failure: Secondary | ICD-10-CM | POA: Diagnosis not present

## 2018-01-07 DIAGNOSIS — E871 Hypo-osmolality and hyponatremia: Secondary | ICD-10-CM | POA: Diagnosis not present

## 2018-01-07 DIAGNOSIS — J9601 Acute respiratory failure with hypoxia: Secondary | ICD-10-CM | POA: Diagnosis not present

## 2018-01-08 ENCOUNTER — Other Ambulatory Visit: Payer: Self-pay

## 2018-01-08 DIAGNOSIS — J9600 Acute respiratory failure, unspecified whether with hypoxia or hypercapnia: Secondary | ICD-10-CM | POA: Diagnosis not present

## 2018-01-08 DIAGNOSIS — I5043 Acute on chronic combined systolic (congestive) and diastolic (congestive) heart failure: Secondary | ICD-10-CM | POA: Diagnosis not present

## 2018-01-08 DIAGNOSIS — D649 Anemia, unspecified: Secondary | ICD-10-CM | POA: Diagnosis not present

## 2018-01-08 DIAGNOSIS — M8940 Other hypertrophic osteoarthropathy, unspecified site: Secondary | ICD-10-CM | POA: Diagnosis not present

## 2018-01-08 DIAGNOSIS — I5042 Chronic combined systolic (congestive) and diastolic (congestive) heart failure: Secondary | ICD-10-CM | POA: Diagnosis not present

## 2018-01-08 DIAGNOSIS — Z09 Encounter for follow-up examination after completed treatment for conditions other than malignant neoplasm: Secondary | ICD-10-CM | POA: Diagnosis not present

## 2018-01-08 DIAGNOSIS — D509 Iron deficiency anemia, unspecified: Secondary | ICD-10-CM | POA: Diagnosis not present

## 2018-01-08 DIAGNOSIS — E876 Hypokalemia: Secondary | ICD-10-CM | POA: Diagnosis not present

## 2018-01-08 DIAGNOSIS — I499 Cardiac arrhythmia, unspecified: Secondary | ICD-10-CM | POA: Diagnosis not present

## 2018-01-08 DIAGNOSIS — I714 Abdominal aortic aneurysm, without rupture: Secondary | ICD-10-CM | POA: Diagnosis not present

## 2018-01-08 DIAGNOSIS — K219 Gastro-esophageal reflux disease without esophagitis: Secondary | ICD-10-CM | POA: Diagnosis not present

## 2018-01-08 DIAGNOSIS — F419 Anxiety disorder, unspecified: Secondary | ICD-10-CM | POA: Diagnosis not present

## 2018-01-08 DIAGNOSIS — I5033 Acute on chronic diastolic (congestive) heart failure: Secondary | ICD-10-CM | POA: Diagnosis not present

## 2018-01-08 DIAGNOSIS — J449 Chronic obstructive pulmonary disease, unspecified: Secondary | ICD-10-CM | POA: Diagnosis not present

## 2018-01-08 DIAGNOSIS — R05 Cough: Secondary | ICD-10-CM | POA: Diagnosis not present

## 2018-01-08 DIAGNOSIS — J90 Pleural effusion, not elsewhere classified: Secondary | ICD-10-CM | POA: Diagnosis not present

## 2018-01-08 NOTE — Patient Outreach (Signed)
[facility]) Care Management  01/08/2018  Shawn Frye [facility] Feb 22, 1919 [id number]  RN CM attempted to contact Shawn Frye for Transition of care services. Call answered by family care aide. She reported that Shawn Frye had an appointment and was not  home at the time of the call. RN CM provided contact number and requested that Shawn Frye return the call after his appointment.   PLAN Follow up pending return call from member.   [place] [phone]

## 2018-01-12 ENCOUNTER — Other Ambulatory Visit: Payer: Self-pay

## 2018-01-12 ENCOUNTER — Encounter (HOSPITAL_COMMUNITY): Payer: Self-pay | Admitting: Emergency Medicine

## 2018-01-12 ENCOUNTER — Emergency Department (HOSPITAL_COMMUNITY): Payer: Medicare Other

## 2018-01-12 ENCOUNTER — Inpatient Hospital Stay (HOSPITAL_COMMUNITY)
Admission: EM | Admit: 2018-01-12 | Discharge: 2018-01-14 | DRG: 871 | Disposition: A | Discharge status: Home Health | Payer: Medicare Other | Attending: Internal Medicine | Admitting: Internal Medicine

## 2018-01-12 DIAGNOSIS — R0602 Shortness of breath: Secondary | ICD-10-CM | POA: Diagnosis present

## 2018-01-12 DIAGNOSIS — E222 Syndrome of inappropriate secretion of antidiuretic hormone: Secondary | ICD-10-CM | POA: Diagnosis present

## 2018-01-12 DIAGNOSIS — Z87891 Personal history of nicotine dependence: Secondary | ICD-10-CM

## 2018-01-12 DIAGNOSIS — I420 Dilated cardiomyopathy: Secondary | ICD-10-CM | POA: Diagnosis present

## 2018-01-12 DIAGNOSIS — Z96653 Presence of artificial knee joint, bilateral: Secondary | ICD-10-CM | POA: Diagnosis present

## 2018-01-12 DIAGNOSIS — J189 Pneumonia, unspecified organism: Secondary | ICD-10-CM

## 2018-01-12 DIAGNOSIS — I714 Abdominal aortic aneurysm, without rupture, unspecified: Secondary | ICD-10-CM | POA: Diagnosis present

## 2018-01-12 DIAGNOSIS — R748 Abnormal levels of other serum enzymes: Secondary | ICD-10-CM | POA: Diagnosis not present

## 2018-01-12 DIAGNOSIS — Z881 Allergy status to other antibiotic agents status: Secondary | ICD-10-CM

## 2018-01-12 DIAGNOSIS — J181 Lobar pneumonia, unspecified organism: Secondary | ICD-10-CM | POA: Diagnosis not present

## 2018-01-12 DIAGNOSIS — I441 Atrioventricular block, second degree: Secondary | ICD-10-CM | POA: Diagnosis present

## 2018-01-12 DIAGNOSIS — Z961 Presence of intraocular lens: Secondary | ICD-10-CM | POA: Diagnosis present

## 2018-01-12 DIAGNOSIS — Z79899 Other long term (current) drug therapy: Secondary | ICD-10-CM | POA: Diagnosis not present

## 2018-01-12 DIAGNOSIS — K219 Gastro-esophageal reflux disease without esophagitis: Secondary | ICD-10-CM | POA: Diagnosis present

## 2018-01-12 DIAGNOSIS — M19011 Primary osteoarthritis, right shoulder: Secondary | ICD-10-CM | POA: Diagnosis present

## 2018-01-12 DIAGNOSIS — Z9842 Cataract extraction status, left eye: Secondary | ICD-10-CM

## 2018-01-12 DIAGNOSIS — I248 Other forms of acute ischemic heart disease: Secondary | ICD-10-CM | POA: Diagnosis present

## 2018-01-12 DIAGNOSIS — R778 Other specified abnormalities of plasma proteins: Secondary | ICD-10-CM

## 2018-01-12 DIAGNOSIS — Z96612 Presence of left artificial shoulder joint: Secondary | ICD-10-CM | POA: Diagnosis present

## 2018-01-12 DIAGNOSIS — D509 Iron deficiency anemia, unspecified: Secondary | ICD-10-CM | POA: Diagnosis present

## 2018-01-12 DIAGNOSIS — Y95 Nosocomial condition: Secondary | ICD-10-CM | POA: Diagnosis present

## 2018-01-12 DIAGNOSIS — I5022 Chronic systolic (congestive) heart failure: Secondary | ICD-10-CM | POA: Diagnosis present

## 2018-01-12 DIAGNOSIS — I509 Heart failure, unspecified: Secondary | ICD-10-CM | POA: Diagnosis not present

## 2018-01-12 DIAGNOSIS — Z66 Do not resuscitate: Secondary | ICD-10-CM | POA: Diagnosis present

## 2018-01-12 DIAGNOSIS — R7989 Other specified abnormal findings of blood chemistry: Secondary | ICD-10-CM

## 2018-01-12 DIAGNOSIS — E440 Moderate protein-calorie malnutrition: Secondary | ICD-10-CM | POA: Diagnosis present

## 2018-01-12 DIAGNOSIS — Z885 Allergy status to narcotic agent status: Secondary | ICD-10-CM

## 2018-01-12 DIAGNOSIS — Z888 Allergy status to other drugs, medicaments and biological substances status: Secondary | ICD-10-CM

## 2018-01-12 DIAGNOSIS — Z9841 Cataract extraction status, right eye: Secondary | ICD-10-CM | POA: Diagnosis not present

## 2018-01-12 DIAGNOSIS — E876 Hypokalemia: Secondary | ICD-10-CM | POA: Diagnosis present

## 2018-01-12 DIAGNOSIS — Z6822 Body mass index (BMI) 22.0-22.9, adult: Secondary | ICD-10-CM | POA: Diagnosis not present

## 2018-01-12 DIAGNOSIS — A419 Sepsis, unspecified organism: Principal | ICD-10-CM | POA: Diagnosis present

## 2018-01-12 DIAGNOSIS — Z950 Presence of cardiac pacemaker: Secondary | ICD-10-CM

## 2018-01-12 DIAGNOSIS — Z8249 Family history of ischemic heart disease and other diseases of the circulatory system: Secondary | ICD-10-CM | POA: Diagnosis not present

## 2018-01-12 LAB — TROPONIN I: Troponin I: 0.05 ng/mL (ref <0.03)

## 2018-01-12 LAB — CBC WITH DIFFERENTIAL/PLATELET
Basophils Absolute: 0 10*3/uL (ref 0.0–0.1)
Basophils Relative: 0 %
EOS PCT: 0 %
Eosinophils Absolute: 0 10*3/uL (ref 0.0–0.7)
HCT: 33.1 % — ABNORMAL LOW (ref 39.0–52.0)
Hemoglobin: 11 g/dL — ABNORMAL LOW (ref 13.0–17.0)
LYMPHS ABS: 1 10*3/uL (ref 0.7–4.0)
LYMPHS PCT: 7 %
MCH: 28.2 pg (ref 26.0–34.0)
MCHC: 33.2 g/dL (ref 30.0–36.0)
MCV: 84.9 fL (ref 78.0–100.0)
MONO ABS: 3.1 10*3/uL — AB (ref 0.1–1.0)
Monocytes Relative: 21 %
Neutro Abs: 10.3 10*3/uL — ABNORMAL HIGH (ref 1.7–7.7)
Neutrophils Relative %: 72 %
PLATELETS: 201 10*3/uL (ref 150–400)
RBC: 3.9 MIL/uL — AB (ref 4.22–5.81)
RDW: 17.7 % — AB (ref 11.5–15.5)
WBC: 14.4 10*3/uL — ABNORMAL HIGH (ref 4.0–10.5)

## 2018-01-12 LAB — URINALYSIS, ROUTINE W REFLEX MICROSCOPIC
BACTERIA UA: NONE SEEN
BILIRUBIN URINE: NEGATIVE
GLUCOSE, UA: NEGATIVE mg/dL
Hgb urine dipstick: NEGATIVE
Ketones, ur: NEGATIVE mg/dL
NITRITE: NEGATIVE
PROTEIN: NEGATIVE mg/dL
Specific Gravity, Urine: 1.013 (ref 1.005–1.030)
pH: 5 (ref 5.0–8.0)

## 2018-01-12 LAB — COMPREHENSIVE METABOLIC PANEL
ALT: 21 U/L (ref 0–44)
AST: 44 U/L — ABNORMAL HIGH (ref 15–41)
Albumin: 3.4 g/dL — ABNORMAL LOW (ref 3.5–5.0)
Alkaline Phosphatase: 139 U/L — ABNORMAL HIGH (ref 38–126)
Anion gap: 10 (ref 5–15)
BILIRUBIN TOTAL: 1.6 mg/dL — AB (ref 0.3–1.2)
BUN: 24 mg/dL — AB (ref 8–23)
CHLORIDE: 103 mmol/L (ref 98–111)
CO2: 21 mmol/L — ABNORMAL LOW (ref 22–32)
CREATININE: 1.21 mg/dL (ref 0.61–1.24)
Calcium: 8.6 mg/dL — ABNORMAL LOW (ref 8.9–10.3)
GFR calc Af Amer: 56 mL/min — ABNORMAL LOW (ref >60)
GFR, EST NON AFRICAN AMERICAN: 49 mL/min — AB (ref >60)
Glucose, Bld: 148 mg/dL — ABNORMAL HIGH (ref 70–99)
Potassium: 4 mmol/L (ref 3.5–5.1)
Sodium: 134 mmol/L — ABNORMAL LOW (ref 135–145)
TOTAL PROTEIN: 7.3 g/dL (ref 6.5–8.1)

## 2018-01-12 LAB — LACTIC ACID, PLASMA: LACTIC ACID, VENOUS: 1.6 mmol/L (ref 0.5–1.9)

## 2018-01-12 LAB — BRAIN NATRIURETIC PEPTIDE: B Natriuretic Peptide: 3053 pg/mL — ABNORMAL HIGH (ref 0.0–100.0)

## 2018-01-12 LAB — PROCALCITONIN: Procalcitonin: 0.15 ng/mL

## 2018-01-12 MED ORDER — POTASSIUM CHLORIDE CRYS ER 20 MEQ PO TBCR
40.0000 meq | EXTENDED_RELEASE_TABLET | Freq: Every day | ORAL | Status: DC
  Administered 2018-01-13 – 2018-01-14 (×2): 40 meq via ORAL
  Filled 2018-01-12 (×2): qty 2

## 2018-01-12 MED ORDER — FUROSEMIDE 10 MG/ML IJ SOLN
20.0000 mg | Freq: Once | INTRAMUSCULAR | Status: AC
  Administered 2018-01-12: 20 mg via INTRAVENOUS
  Filled 2018-01-12: qty 2

## 2018-01-12 MED ORDER — SODIUM CHLORIDE 0.9 % IV SOLN: 250.0000 mL | INTRAVENOUS | Status: DC | PRN

## 2018-01-12 MED ORDER — SODIUM CHLORIDE 0.9 % IV SOLN
1.0000 g | Freq: Three times a day (TID) | INTRAVENOUS | Status: DC
  Administered 2018-01-12: 1 g via INTRAVENOUS
  Filled 2018-01-12 (×3): qty 1

## 2018-01-12 MED ORDER — ACETAMINOPHEN 650 MG RE SUPP: 650.0000 mg | Freq: Four times a day (QID) | RECTAL | Status: DC | PRN

## 2018-01-12 MED ORDER — ALPRAZOLAM 0.25 MG PO TABS: 0.1250 mg | ORAL_TABLET | Freq: Every day | ORAL | Status: DC | PRN

## 2018-01-12 MED ORDER — SODIUM CHLORIDE 0.9% FLUSH: 3.0000 mL | INTRAVENOUS | Status: DC | PRN

## 2018-01-12 MED ORDER — SODIUM CHLORIDE 0.9 % IV SOLN
INTRAVENOUS | Status: DC
  Administered 2018-01-12: 19:00:00 via INTRAVENOUS

## 2018-01-12 MED ORDER — ONDANSETRON HCL 4 MG/2ML IJ SOLN: 4.0000 mg | Freq: Four times a day (QID) | INTRAMUSCULAR | Status: DC | PRN

## 2018-01-12 MED ORDER — FUROSEMIDE 40 MG PO TABS
40.0000 mg | ORAL_TABLET | Freq: Every day | ORAL | Status: DC
  Administered 2018-01-13 – 2018-01-14 (×2): 40 mg via ORAL
  Filled 2018-01-12 (×2): qty 1

## 2018-01-12 MED ORDER — SODIUM CHLORIDE 0.9% FLUSH
3.0000 mL | Freq: Two times a day (BID) | INTRAVENOUS | Status: DC
  Administered 2018-01-12 – 2018-01-14 (×4): 3 mL via INTRAVENOUS

## 2018-01-12 MED ORDER — DEXTROMETHORPHAN POLISTIREX ER 30 MG/5ML PO SUER
30.0000 mg | Freq: Every day | ORAL | Status: DC
  Administered 2018-01-13 – 2018-01-14 (×2): 30 mg via ORAL
  Filled 2018-01-12 (×2): qty 5

## 2018-01-12 MED ORDER — METOPROLOL SUCCINATE ER 25 MG PO TB24
12.5000 mg | ORAL_TABLET | Freq: Every day | ORAL | Status: DC
  Administered 2018-01-13: 12.5 mg via ORAL
  Filled 2018-01-12 (×2): qty 1

## 2018-01-12 MED ORDER — LORATADINE 10 MG PO TABS
10.0000 mg | ORAL_TABLET | Freq: Every day | ORAL | Status: DC
  Administered 2018-01-13 – 2018-01-14 (×2): 10 mg via ORAL
  Filled 2018-01-12 (×2): qty 1

## 2018-01-12 MED ORDER — VANCOMYCIN HCL IN DEXTROSE 750-5 MG/150ML-% IV SOLN
750.0000 mg | INTRAVENOUS | Status: DC
  Filled 2018-01-12: qty 150

## 2018-01-12 MED ORDER — FERROUS SULFATE 325 (65 FE) MG PO TABS
325.0000 mg | ORAL_TABLET | Freq: Every day | ORAL | Status: DC
  Administered 2018-01-13 – 2018-01-14 (×2): 325 mg via ORAL
  Filled 2018-01-12 (×2): qty 1

## 2018-01-12 MED ORDER — ACETAMINOPHEN 325 MG PO TABS: 650.0000 mg | ORAL_TABLET | Freq: Four times a day (QID) | ORAL | Status: DC | PRN

## 2018-01-12 MED ORDER — SODIUM CHLORIDE 0.9 % IV SOLN
1.0000 g | INTRAVENOUS | Status: DC
  Filled 2018-01-12: qty 1

## 2018-01-12 MED ORDER — VANCOMYCIN HCL 10 G IV SOLR
1500.0000 mg | Freq: Once | INTRAVENOUS | Status: AC
  Administered 2018-01-12: 1500 mg via INTRAVENOUS
  Filled 2018-01-12 (×2): qty 1500

## 2018-01-12 MED ORDER — ONDANSETRON HCL 4 MG PO TABS: 4.0000 mg | ORAL_TABLET | Freq: Four times a day (QID) | ORAL | Status: DC | PRN

## 2018-01-12 MED ORDER — IPRATROPIUM-ALBUTEROL 0.5-2.5 (3) MG/3ML IN SOLN
3.0000 mL | Freq: Once | RESPIRATORY_TRACT | Status: AC
  Administered 2018-01-12: 3 mL via RESPIRATORY_TRACT
  Filled 2018-01-12: qty 3

## 2018-01-12 MED ORDER — ENOXAPARIN SODIUM 40 MG/0.4ML ~~LOC~~ SOLN
40.0000 mg | SUBCUTANEOUS | Status: DC
  Administered 2018-01-12 – 2018-01-13 (×2): 40 mg via SUBCUTANEOUS
  Filled 2018-01-12 (×2): qty 0.4

## 2018-01-12 MED ORDER — SODIUM CHLORIDE 0.9 % IV BOLUS: 250.0000 mL | Freq: Once | INTRAVENOUS | Status: DC

## 2018-01-12 NOTE — ED Provider Notes (Signed)
[facility] EMERGENCY DEPARTMENT Provider Note   CSN: [id number] Arrival date & time: 01/12/18  1552     History   Chief Complaint Chief Complaint  Patient presents with  . Shortness of Breath    HPI Shawn Frye is a 84 y.o. male.   Shortness of Breath   Pt was seen at 1750.  Per pt and his family, c/o gradual onset and worsening of persistent SOB for the past 3 to 4 days. Has been associated with cough, "chills," and generalized weakness. Unclear if pt's pedal edema has increased over the past 1 week, but pt denies weight gain. Denies CP/palpitations, no objective fevers, no back pain, no abd pain, no N/V/D, no rash, no focal motor weakness.    Past Medical History:  Diagnosis Date  . AAA (abdominal aortic aneurysm) ([place])   . Arthritis   . Chronic combined systolic (congestive) and diastolic (congestive) heart failure ([place])    a. 10/2017: echo showing a reduced EF of 25%, diffuse HK with akinesis of the apical myocardium, Grade 2 DD, mild AI, mild to moderate MR, and mild to moderate TR  . GERD (gastroesophageal reflux disease)   . Heart block AV second degree    St. Jude pacemaker May 2017 - Dr. [name]  . Infection of bladder   . Joint pain   . Symptomatic bradycardia     Patient Active Problem List   Diagnosis Date Noted  . Acute on chronic combined systolic and diastolic CHF (congestive heart failure) ([place]) 12/14/2017  . Acute respiratory failure with hypoxia ([place]) 12/13/2017  . Acute on chronic systolic CHF (congestive heart failure) ([place]) 12/13/2017  . Dilated cardiomyopathy ([place]) 12/13/2017  . Cardiac pacemaker 12/13/2017  . Hyponatremia 12/13/2017  . Primary osteoarthritis, right shoulder 01/08/2017  . Mobitz type 2 second degree atrioventricular block 10/02/2015  . Bradycardia 08/29/2015  . Aneurysm of abdominal vessel ([place]) 03/21/2011    Past Surgical History:  Procedure Laterality Date  . BACK SURGERY    . CATARACT EXTRACTION W/ INTRAOCULAR LENS   IMPLANT, BILATERAL Bilateral   . EP IMPLANTABLE DEVICE N/A 10/02/2015   Procedure: Pacemaker Implant;  Surgeon: Evans Lance, MD;  Location: [facility];  Service: Cardiovascular;  Laterality: N/A;  . EXCISIONAL HEMORRHOIDECTOMY    . HEMORROIDECTOMY    . INGUINAL HERNIA REPAIR Bilateral 1991  . INSERT / REPLACE / REMOVE PACEMAKER    . JOINT REPLACEMENT    . [place] SURGERY  2005  . TOTAL KNEE ARTHROPLASTY Bilateral 1998 -  2003  . TOTAL SHOULDER REPLACEMENT Left 1999   Partial  . TYMPANOSTOMY TUBE PLACEMENT Right         Home Medications    Prior to Admission medications   Medication Sig Start Date End Date Taking? Authorizing Provider  ALPRAZolam (XANAX) 0.25 MG tablet Take 0.125-0.25 mg by mouth daily as needed for anxiety.   Yes [provider]  dextromethorphan (DELSYM) 30 MG/5ML liquid Take 30 mg by mouth daily.   Yes [provider]  ferrous sulfate 325 (65 FE) MG EC tablet Take 1 tablet by mouth daily. 12/04/17  Yes [provider]  furosemide (LASIX) 40 MG tablet Take 1 tablet (40 mg total) by mouth daily. 12/19/17  Yes Isaac Bliss, Rayford Halsted, MD  loratadine (CLARITIN) 10 MG tablet Take 10 mg by mouth daily.   Yes [provider]  metoprolol succinate (TOPROL-XL) 25 MG 24 hr tablet Take 12.5 mg by mouth daily.  Yes [provider]  potassium chloride SA (K-DUR,KLOR-CON) 20 MEQ tablet Take 2 tablets (40 mEq total) by mouth daily. 12/18/17  Yes Erline Hau, MD    Family History Family History  Problem Relation Age of Onset  . Heart disease Father        NOT  before age 18  . Pneumonia Mother   . Cystic fibrosis Paternal Aunt   . Kidney disease Neg Hx     Social History Social History   Tobacco Use  . Smoking status: Former Smoker    Packs/day: 1.00    Years: 15.00    Pack years: 15.00    Types: Cigarettes    Last attempt to quit: 05/13/1950    Years since quitting: 67.7  . Smokeless  tobacco: Never Used  Substance Use Topics  . Alcohol use: No    Alcohol/week: 0.0 standard drinks  . Drug use: No     Allergies   Percocet [oxycodone-acetaminophen]; Noroxin [norfloxacin]; and Celebrex [celecoxib]   Review of Systems Review of Systems  Respiratory: Positive for shortness of breath.   ROS: Statement: All systems negative except as marked or noted in the HPI; Constitutional: Negative for fever and +chills, generalized weakness. ; ; Eyes: Negative for eye pain, redness and discharge. ; ; ENMT: Negative for ear pain, hoarseness, nasal congestion, sinus pressure and sore throat. ; ; Cardiovascular: Negative for chest pain, palpitations, diaphoresis, +dyspnea and peripheral edema. ; ; Respiratory: +cough. Negative for wheezing and stridor. ; ; Gastrointestinal: Negative for nausea, vomiting, diarrhea, abdominal pain, blood in stool, hematemesis, jaundice and rectal bleeding. . ; ; Genitourinary: Negative for dysuria, flank pain and hematuria. ; ; Musculoskeletal: Negative for back pain and neck pain. Negative for swelling and trauma.; ; Skin: Negative for pruritus, rash, abrasions, blisters, bruising and skin lesion.; ; Neuro: Negative for headache, lightheadedness and neck stiffness. Negative for altered level of consciousness, altered mental status, extremity weakness, paresthesias, involuntary movement, seizure and syncope.        Physical Exam Updated Vital Signs BP 105/72   Pulse 99   Temp 97.9 F (36.6 C) (Oral)   Wt 65.3 kg   SpO2 93%   BMI 22.55 kg/m    BP 99/62   Pulse (!) 102   Temp 97.7 F (36.5 C) (Oral)   Resp 20   Ht 5' 7.01" (1.702 m)   Wt 65.7 kg   SpO2 100%   BMI 22.68 kg/m    Patient Vitals for the past 24 hrs:  BP Temp Temp src Pulse Resp SpO2 Weight  01/12/18 1820 - - - - - 93 % -  01/12/18 1753 - - - 99 - 92 % -  01/12/18 1752 105/72 - - 99 - 90 % -  01/12/18 1601 - - - - - - 65.3 kg  01/12/18 1600 103/68 97.9 F (36.6 C) Oral (!) 105  - 97 % -     Physical Exam 1755: Physical examination:  Nursing notes reviewed; Vital signs and O2 SAT reviewed;  Constitutional: Well developed, Well nourished, In no acute distress; Head:  Normocephalic, atraumatic; Eyes: EOMI, PERRL, No scleral icterus; ENMT: Mouth and pharynx normal, Mucous membranes dry; Neck: Supple, Full range of motion, No lymphadenopathy; Cardiovascular: Regular rate and rhythm, No gallop; Respiratory: Breath sounds diminished & equal bilaterally, No wheezes.  Speaking full sentences with ease, Normal respiratory effort/excursion; Chest: Nontender, Movement normal; Abdomen: Soft, Nontender, Nondistended, Normal bowel sounds; Genitourinary: No CVA tenderness; Extremities: Peripheral pulses  normal, No tenderness, +2 pedal edema bilat. No calf asymmetry.; Neuro: AA&Ox3, +HOH. No facial droop.  Speech clear. No gross focal motor deficits in extremities.; Skin: Color normal, Warm, Dry.   ED Treatments / Results  Labs (all labs ordered are listed, but only abnormal results are displayed)   EKG None  Radiology   Procedures Procedures (including critical care time)  Medications Ordered in ED Medications  0.9 %  sodium chloride infusion (has no administration in time range)  ipratropium-albuterol (DUONEB) 0.5-2.5 (3) MG/3ML nebulizer solution 3 mL (3 mLs Nebulization Given 01/12/18 1820)     Initial Impression / Assessment and Plan / ED Course  I have reviewed the triage vital signs and the nursing notes.  Pertinent labs & imaging results that were available during my care of the patient were reviewed by me and considered in my medical decision making (see chart for details).  MDM Reviewed: previous chart, nursing note and vitals Reviewed previous: labs and ECG Interpretation: labs, ECG and x-ray   ED ECG REPORT   Date: 01/12/2018  Rate: 95  Rhythm: Afib/flutter and Ventricular paced complexes  QRS Axis: left  Intervals: QT prolonged  ST/T Wave  abnormalities: nonspecific ST/T changes  Conduction Disutrbances:nonspecific intraventricular conduction delay  Narrative Interpretation:   Old EKG Reviewed: unchanged; no significant changes from previous EKG dated 12/13/2017. I have personally reviewed the EKG tracing and agree with the computerized printout as noted.   Results for orders placed or performed during the hospital encounter of 01/12/18  CBC with Differential  Result Value Ref Range   WBC 14.4 (H) 4.0 - 10.5 K/uL   RBC 3.90 (L) 4.22 - 5.81 MIL/uL   Hemoglobin 11.0 (L) 13.0 - 17.0 g/dL   HCT 33.1 (L) 39.0 - 52.0 %   MCV 84.9 78.0 - 100.0 fL   MCH 28.2 26.0 - 34.0 pg   MCHC 33.2 30.0 - 36.0 g/dL   RDW 17.7 (H) 11.5 - 15.5 %   Platelets 201 150 - 400 K/uL   Neutrophils Relative % 72 %   Neutro Abs 10.3 (H) 1.7 - 7.7 K/uL   Lymphocytes Relative 7 %   Lymphs Abs 1.0 0.7 - 4.0 K/uL   Monocytes Relative 21 %   Monocytes Absolute 3.1 (H) 0.1 - 1.0 K/uL   Eosinophils Relative 0 %   Eosinophils Absolute 0.0 0.0 - 0.7 K/uL   Basophils Relative 0 %   Basophils Absolute 0.0 0.0 - 0.1 K/uL  Comprehensive metabolic panel  Result Value Ref Range   Sodium 134 (L) 135 - 145 mmol/L   Potassium 4.0 3.5 - 5.1 mmol/L   Chloride 103 98 - 111 mmol/L   CO2 21 (L) 22 - 32 mmol/L   Glucose, Bld 148 (H) 70 - 99 mg/dL   BUN 24 (H) 8 - 23 mg/dL   Creatinine, Ser 1.21 0.61 - 1.24 mg/dL   Calcium 8.6 (L) 8.9 - 10.3 mg/dL   Total Protein 7.3 6.5 - 8.1 g/dL   Albumin 3.4 (L) 3.5 - 5.0 g/dL   AST 44 (H) 15 - 41 U/L   ALT 21 0 - 44 U/L   Alkaline Phosphatase 139 (H) 38 - 126 U/L   Total Bilirubin 1.6 (H) 0.3 - 1.2 mg/dL   GFR calc non Af Amer 49 (L) >60 mL/min   GFR calc Af Amer 56 (L) >60 mL/min   Anion gap 10 5 - 15  Lactic acid, plasma  Result Value Ref Range   Lactic  Acid, Venous 1.6 0.5 - 1.9 mmol/L  Brain natriuretic peptide  Result Value Ref Range   B Natriuretic Peptide 3,053.0 (H) 0.0 - 100.0 pg/mL  Troponin I  Result Value  Ref Range   Troponin I 0.05 (HH) <0.03 ng/mL   Dg Chest 2 View Result Date: 01/12/2018 CLINICAL DATA:  Patient with worsening shortness of breath. EXAM: CHEST - 2 VIEW COMPARISON:  Chest radiograph 12/13/2017 FINDINGS: Multi lead pacer apparatus overlies the left hemithorax, leads are stable in position. Stable cardiomegaly. Tortuosity and calcification of the thoracic aorta. Coarse bilateral interstitial opacities. Heterogeneous opacities right lung base. Small bilateral pleural effusions. Thoracic spine degenerative changes. IMPRESSION: Cardiomegaly. Coarse bilateral interstitial opacities may represent mild edema. Heterogeneous opacities right lung base may represent atelectasis or infection. Electronically Signed   By: Lovey Newcomer M.D.   On: 01/12/2018 17:02    1910:  BNP elevated from previous with mild edema on CXR. Pt with soft BP; judicious IV lasix dose given with resultant drop in BP. BP did recover quickly. Sats improved after short neb from 90-92% R/A to 97-98% R/A.  ED RN did not perform orthostatic VS, as pt needed heavy assist moving from wheelchair to stretcher on arrival to ED exam room. Pt remains afebrile while in the ED with normal lactic acid. Possible opacity right base; given hx cough and "chills," will dose IV abx. Dx and testing d/w pt and family.  Questions answered.  Verb understanding, agreeable to admit. T/C returned from [facility] Dr. [name], case discussed, including:  HPI, pertinent PM/SHx, VS/PE, dx testing, ED course and treatment:  Agreeable to admit.   Final Clinical Impressions(s) / ED Diagnoses   Final diagnoses:  None    ED Discharge Orders    None       Francine Graven, DO 01/13/18 2125

## 2018-01-12 NOTE — H&P (Signed)
History and Physical    Shawn Frye KCL:[id number] DOB: 12/09/1918 DOA: 01/12/2018  PCP: [name], MD   Patient coming from: Home  Chief Complaint: Dyspnea/cough/chills  HPI: Shawn Frye is a 84 y.o. male with medical history significant for cardiomyopathy with systolic congestive heart failure and LVEF 20%, cardiac pacemaker with history of second-degree AV block, AAA, and iron deficiency anemia who presented to the ED with complaints of gradual worsening of shortness of breath and productive cough over the last 3 to 4 days.  He also complains of some pleuritic chest pain to the right rib cage that is worsened with deep breathing and coughing. He has noted some yellowish sputum, but no hemoptysis.  He was recently discharged from a skilled nursing facility about the same time.  He has had some associated chills, but no fever and has also exhibited some generalized weakness.  He has had no lower extremity edema, orthopnea, paroxysmal nocturnal dyspnea, chest pain, palpitations, abdominal pain, nausea, vomiting, or diarrhea.  Patient denies any weight gain and has been taking his medications as prescribed.   ED Course: Vital signs stable with soft blood pressure readings.  He remains in the mid 90th percentile on room air.  His laboratory data reveals leukocytosis of 14,000, stable hemoglobin of 11, mild hyponatremia of 134, glucose 148, and troponin 0.05.  Lactic acid is noted to be 1.6.  Two-view chest x-ray with noted cardiomegaly and mild edema along with signs of right lower lobe infiltrate.  EKG with noted V paced complexes at 95 bpm.  BNP is greater than 3000.  He has been given 20 mg of IV Lasix and has been started on IV fluid as well as given a breathing treatment in the ED.  Review of Systems: All others reviewed and otherwise negative.  Past Medical History:  Diagnosis Date  . AAA (abdominal aortic aneurysm) ([place])   . Arthritis   . Chronic combined systolic (congestive) and  diastolic (congestive) heart failure ([place])    a. 10/2017: echo showing a reduced EF of 25%, diffuse HK with akinesis of the apical myocardium, Grade 2 DD, mild AI, mild to moderate MR, and mild to moderate TR  . GERD (gastroesophageal reflux disease)   . Heart block AV second degree    St. Jude pacemaker May 2017 - Dr. [name]  . Infection of bladder   . Joint pain   . Symptomatic bradycardia     Past Surgical History:  Procedure Laterality Date  . BACK SURGERY    . CATARACT EXTRACTION W/ INTRAOCULAR LENS  IMPLANT, BILATERAL Bilateral   . EP IMPLANTABLE DEVICE N/A 10/02/2015   Procedure: Pacemaker Implant;  Surgeon: Evans Lance, MD;  Location: [facility];  Service: Cardiovascular;  Laterality: N/A;  . EXCISIONAL HEMORRHOIDECTOMY    . HEMORROIDECTOMY    . INGUINAL HERNIA REPAIR Bilateral 1991  . INSERT / REPLACE / REMOVE PACEMAKER    . JOINT REPLACEMENT    . [place] SURGERY  2005  . TOTAL KNEE ARTHROPLASTY Bilateral 1998 -  2003  . TOTAL SHOULDER REPLACEMENT Left 1999   Partial  . TYMPANOSTOMY TUBE PLACEMENT Right      reports that he quit smoking about 67 years ago. His smoking use included cigarettes. He has a 15.00 pack-year smoking history. He has never used smokeless tobacco. He reports that he does not drink alcohol or use drugs.  Allergies  Allergen Reactions  . Percocet [Oxycodone-Acetaminophen] Hives  . Noroxin [Norfloxacin]  Nausea And Vomiting  . Celebrex [Celecoxib] Rash    Family History  Problem Relation Age of Onset  . Heart disease Father        NOT  before age 58  . Pneumonia Mother   . Cystic fibrosis Paternal Aunt   . Kidney disease Neg Hx     Prior to Admission medications   Medication Sig Start Date End Date Taking? Authorizing Provider  ALPRAZolam (XANAX) 0.25 MG tablet Take 0.125-0.25 mg by mouth daily as needed for anxiety.   Yes [provider]  dextromethorphan (DELSYM) 30 MG/5ML liquid Take 30 mg by mouth daily.   Yes  [provider]  ferrous sulfate 325 (65 FE) MG EC tablet Take 1 tablet by mouth daily. 12/04/17  Yes [provider]  furosemide (LASIX) 40 MG tablet Take 1 tablet (40 mg total) by mouth daily. 12/19/17  Yes Isaac Bliss, Rayford Halsted, MD  loratadine (CLARITIN) 10 MG tablet Take 10 mg by mouth daily.   Yes [provider]  metoprolol succinate (TOPROL-XL) 25 MG 24 hr tablet Take 12.5 mg by mouth daily.   Yes [provider]  potassium chloride SA (K-DUR,KLOR-CON) 20 MEQ tablet Take 2 tablets (40 mEq total) by mouth daily. 12/18/17  Yes Erline Hau, MD    Physical Exam: Vitals:   01/12/18 1820 01/12/18 1830 01/12/18 1845 01/12/18 1900  BP:  92/67  (!) 89/64  Pulse:  87 93 89  Resp:  (!) 32 17 (!) 30  Temp:      TempSrc:      SpO2: 93% 94% 95% 91%  Weight:        Constitutional: NAD, calm, comfortable Vitals:   01/12/18 1820 01/12/18 1830 01/12/18 1845 01/12/18 1900  BP:  92/67  (!) 89/64  Pulse:  87 93 89  Resp:  (!) 32 17 (!) 30  Temp:      TempSrc:      SpO2: 93% 94% 95% 91%  Weight:       Eyes: lids and conjunctivae normal ENMT: Mucous membranes are moist.  Neck: normal, supple Respiratory: clear to auscultation bilaterally. Normal respiratory effort. No accessory muscle use.  Currently on room air. Cardiovascular: Regular rate and rhythm, no murmurs. No extremity edema.  Paced rhythm on telemetry. Abdomen: no tenderness, no distention. Bowel sounds positive.  Musculoskeletal:  No joint deformity upper and lower extremities.   Skin: no rashes, lesions, ulcers.  Psychiatric: Normal judgment and insight. Alert and oriented x 3. Normal mood.   Labs on Admission: I have personally reviewed following labs and imaging studies  CBC: Recent Labs  Lab 01/12/18 1812  WBC 14.4*  NEUTROABS 10.3*  HGB 11.0*  HCT 33.1*  MCV 84.9  PLT 932   Basic Metabolic Panel: Recent Labs  Lab 01/12/18 1812  NA 134*  K 4.0  CL 103  CO2  21*  GLUCOSE 148*  BUN 24*  CREATININE 1.21  CALCIUM 8.6*   GFR: Estimated Creatinine Clearance: 33 mL/min (by C-G formula based on SCr of 1.21 mg/dL). Liver Function Tests: Recent Labs  Lab 01/12/18 1812  AST 44*  ALT 21  ALKPHOS 139*  BILITOT 1.6*  PROT 7.3  ALBUMIN 3.4*   No results for input(s): LIPASE, AMYLASE in the last 168 hours. No results for input(s): AMMONIA in the last 168 hours. Coagulation Profile: No results for input(s): INR, PROTIME in the last 168 hours. Cardiac Enzymes: Recent Labs  Lab 01/12/18 1813  TROPONINI 0.05*  BNP (last 3 results) No results for input(s): PROBNP in the last 8760 hours. HbA1C: No results for input(s): HGBA1C in the last 72 hours. CBG: No results for input(s): GLUCAP in the last 168 hours. Lipid Profile: No results for input(s): CHOL, HDL, LDLCALC, TRIG, CHOLHDL, LDLDIRECT in the last 72 hours. Thyroid Function Tests: No results for input(s): TSH, T4TOTAL, FREET4, T3FREE, THYROIDAB in the last 72 hours. Anemia Panel: No results for input(s): VITAMINB12, FOLATE, FERRITIN, TIBC, IRON, RETICCTPCT in the last 72 hours. Urine analysis:    Component Value Date/Time   COLORURINE YELLOW 12/28/2017 0148   APPEARANCEUR HAZY (A) 12/28/2017 0148   LABSPEC 1.016 12/28/2017 0148   PHURINE 6.0 12/28/2017 0148   GLUCOSEU NEGATIVE 12/28/2017 0148   HGBUR NEGATIVE 12/28/2017 0148   BILIRUBINUR NEGATIVE 12/28/2017 0148   KETONESUR NEGATIVE 12/28/2017 0148   PROTEINUR NEGATIVE 12/28/2017 0148   NITRITE NEGATIVE 12/28/2017 0148   LEUKOCYTESUR LARGE (A) 12/28/2017 0148    Radiological Exams on Admission: Dg Chest 2 View  Result Date: 01/12/2018 CLINICAL DATA:  Patient with worsening shortness of breath. EXAM: CHEST - 2 VIEW COMPARISON:  Chest radiograph 12/13/2017 FINDINGS: Multi lead pacer apparatus overlies the left hemithorax, leads are stable in position. Stable cardiomegaly. Tortuosity and calcification of the thoracic aorta.  Coarse bilateral interstitial opacities. Heterogeneous opacities right lung base. Small bilateral pleural effusions. Thoracic spine degenerative changes. IMPRESSION: Cardiomegaly. Coarse bilateral interstitial opacities may represent mild edema. Heterogeneous opacities right lung base may represent atelectasis or infection. Electronically Signed   By: Lovey Newcomer M.D.   On: 01/12/2018 17:02    EKG: Independently reviewed. V-paced complexes 95bpm.   Assessment/Plan Principal Problem:   Pneumonia Active Problems:   Aneurysm of abdominal vessel (HCC)   Mobitz type 2 second degree atrioventricular block   Pacemaker   Chronic systolic CHF (congestive heart failure) (HCC)   Elevated troponin    1. Sepsis secondary to pneumonia.  Antibiotic coverage with cefepime with noted negativity for MRSA nares in the recent past admission.  Will reevaluate to see if vancomycin may be necessary.  Check blood cultures.  Avoid IV fluids given low EF and chronic mild volume overload.  Check procalcitonin and repeat lactic acid in a.m. 2. Chronic systolic CHF.  Clinically patient does not appear to be in acute exacerbation.  BNP is elevated however this is not uncommon with LVEF of less than 20%.  He does have some mild fluid congestion on chest x-ray, but is not currently in any respiratory distress nor is he requiring any oxygen.  We will continue to monitor daily weights as well as input and output.  Maintain on home Lasix as well as metoprolol.  He is expected to have soft blood pressure readings. 3. Mild hyponatremia.  Continue to monitor carefully.  This has been due to hypervolemia in the past.  Avoid IV fluid for now.  Will check repeat labs in a.m. 4. Elevated troponin.  No chest pain noted and is likely secondary to poor LV function.  Will not continue to monitor at this time unless further symptoms arise. 5. Iron deficiency anemia.  Currently stable.  Continue ferrous sulfate. 6. AAA.  Continue to monitor  with vascular surgery in the outpatient setting.   DVT prophylaxis: Lovenox Code Status: DNR Family Communication: None at bedside Disposition Plan:Treatment for pneumonia Consults called:None Admission status: Inpatient, Tele   Kimble Delaurentis Darleen Crocker DO [facility] Pager [phone]  If 7PM-7AM, please contact night-coverage [email or web address] Password [facility]  01/12/2018, 7:33  PM    

## 2018-01-12 NOTE — ED Triage Notes (Signed)
Pt reports being increasingly short of breath for 3-4 days with worsening today and chills.  Pain in right ribs with deep breathing.

## 2018-01-12 NOTE — ED Notes (Signed)
Call Fitzgerald with admission or dc. She is taking pt's wallet and watch. Another number [phone]

## 2018-01-12 NOTE — Progress Notes (Signed)
  Pharmacy Antibiotic Note  Shawn Frye is a 84 y.o. male admitted on 01/12/2018 with sepsis due to pneumonia.  Pharmacy has been consulted for vancomycin dosing. . Patient was recently discharged from a SNF and has a history of positive  MRSA nares.  WBC count is elevated, though patient is afebrile. Lactate is 1.6.   Plan:  Loading dose:  Vancomycin 1.5g IV x1 dose now Maintenance dose: vancomycin 750mg  IV q24h Goal trough range: 15-20   mcg/mL Pharmacy will continue to monitor renal function, vancomycin troughs, cultures and patient progress.   Weight: 144 lb (65.3 kg)  Temp (24hrs), Avg:97.9 F (36.6 C), Min:97.9 F (36.6 C), Max:97.9 F (36.6 C)  Recent Labs  Lab 01/12/18 1812  WBC 14.4*  CREATININE 1.21  LATICACIDVEN 1.6    Estimated Creatinine Clearance: 33 mL/min (by C-G formula based on SCr of 1.21 mg/dL).    Allergies  Allergen Reactions  . Percocet [Oxycodone-Acetaminophen] Hives  . Noroxin [Norfloxacin] Nausea And Vomiting  . Celebrex [Celecoxib] Rash    Antimicrobials this admission: Vancomycin 9/2  >> Cefepime 9/2 >>   Microbiology results: 9/2 UCx: in progress  Thank you for allowing pharmacy to be a part of this patient's care.  Despina Pole, Pharm. D. Clinical Pharmacist 01/12/2018 8:02 PM

## 2018-01-12 NOTE — ED Notes (Signed)
Pt very weak moving from wheelchair to stretcher. Unable to perform orthostatic vital signs at this time.

## 2018-01-13 LAB — CBC
HCT: 29.4 % — ABNORMAL LOW (ref 39.0–52.0)
HEMOGLOBIN: 9.4 g/dL — AB (ref 13.0–17.0)
MCH: 27.2 pg (ref 26.0–34.0)
MCHC: 32 g/dL (ref 30.0–36.0)
MCV: 85 fL (ref 78.0–100.0)
Platelets: 168 10*3/uL (ref 150–400)
RBC: 3.46 MIL/uL — ABNORMAL LOW (ref 4.22–5.81)
RDW: 17.7 % — ABNORMAL HIGH (ref 11.5–15.5)
WBC: 11.6 10*3/uL — ABNORMAL HIGH (ref 4.0–10.5)

## 2018-01-13 LAB — MAGNESIUM: Magnesium: 2 mg/dL (ref 1.7–2.4)

## 2018-01-13 LAB — BASIC METABOLIC PANEL
ANION GAP: 7 (ref 5–15)
BUN: 20 mg/dL (ref 8–23)
CO2: 26 mmol/L (ref 22–32)
CREATININE: 1.08 mg/dL (ref 0.61–1.24)
Calcium: 8.1 mg/dL — ABNORMAL LOW (ref 8.9–10.3)
Chloride: 106 mmol/L (ref 98–111)
GFR calc Af Amer: >60 mL/min (ref >60)
GFR calc non Af Amer: 56 mL/min — ABNORMAL LOW (ref >60)
GLUCOSE: 109 mg/dL — AB (ref 70–99)
Potassium: 3.2 mmol/L — ABNORMAL LOW (ref 3.5–5.1)
Sodium: 139 mmol/L (ref 135–145)

## 2018-01-13 LAB — LACTIC ACID, PLASMA: Lactic Acid, Venous: 1.2 mmol/L (ref 0.5–1.9)

## 2018-01-13 MED ORDER — AMOXICILLIN-POT CLAVULANATE 875-125 MG PO TABS
1.0000 | ORAL_TABLET | Freq: Two times a day (BID) | ORAL | Status: DC
  Administered 2018-01-13 – 2018-01-14 (×3): 1 via ORAL
  Filled 2018-01-13 (×3): qty 1

## 2018-01-13 MED ORDER — ENSURE ENLIVE PO LIQD
237.0000 mL | Freq: Two times a day (BID) | ORAL | Status: DC
  Administered 2018-01-13 – 2018-01-14 (×3): 237 mL via ORAL

## 2018-01-13 MED ORDER — GUAIFENESIN ER 600 MG PO TB12
600.0000 mg | ORAL_TABLET | Freq: Two times a day (BID) | ORAL | Status: DC
  Administered 2018-01-13 – 2018-01-14 (×3): 600 mg via ORAL
  Filled 2018-01-13 (×3): qty 1

## 2018-01-13 MED ORDER — IPRATROPIUM BROMIDE 0.02 % IN SOLN
0.5000 mg | Freq: Four times a day (QID) | RESPIRATORY_TRACT | Status: DC | PRN
  Administered 2018-01-13 (×2): 0.5 mg via RESPIRATORY_TRACT
  Filled 2018-01-13 (×2): qty 2.5

## 2018-01-13 MED ORDER — LEVALBUTEROL HCL 0.63 MG/3ML IN NEBU
0.6300 mg | INHALATION_SOLUTION | Freq: Four times a day (QID) | RESPIRATORY_TRACT | Status: DC | PRN
  Administered 2018-01-13 (×2): 0.63 mg via RESPIRATORY_TRACT
  Filled 2018-01-13 (×2): qty 3

## 2018-01-13 NOTE — Progress Notes (Addendum)
PROGRESS NOTE    Shawn Frye  EHU:[id number] DOB: January 21, 1919 DOA: 01/12/2018 PCP: [name], MD   Brief Narrative:   Shawn Frye is a 84 y.o. male with medical history significant for cardiomyopathy with systolic congestive heart failure and LVEF 20%, cardiac pacemaker with history of second-degree AV block, AAA, and iron deficiency anemia who presented to the ED with complaints of gradual worsening of shortness of breath and productive cough over the last 3 to 4 days.  He also complains of some pleuritic chest pain to the right rib cage that is worsened with deep breathing and coughing. He has noted some yellowish sputum, but no hemoptysis.  He was admitted with sepsis secondary to pneumonia and was initially started on some IV cefepime.  Assessment & Plan:   Principal Problem:   Pneumonia Active Problems:   Aneurysm of abdominal vessel (HCC)   Mobitz type 2 second degree atrioventricular block   Pacemaker   Chronic systolic CHF (congestive heart failure) (HCC)   Elevated troponin   1. Sepsis secondary to pneumonia.    Appears to be improving this morning.  Will discontinue cefepime and vancomycin and switch to oral Augmentin.  Symptomatic treatment with Mucinex as well as flutter valve ordered.  Wean oxygen as he is only on less than 1 L. 2. Chronic systolic CHF.  Clinically patient does not appear to be in acute exacerbation.  BNP is elevated however this is not uncommon with LVEF of less than 20%.  He does have some mild fluid congestion on chest x-ray, but is not currently in any respiratory distress.  We will continue to monitor daily weights as well as input and output.  Maintain on home Lasix as well as metoprolol.  He is expected to have soft blood pressure readings given poor LVEF. 3. Hypokalemia.  Oral supplementation today with recheck in a.m. along with magnesium. 4. Mild hyponatremia-resolved.  Continue to monitor and recheck labs in a.m. 5. Elevated troponin.  No chest  pain noted and is likely secondary to poor LV function.  Will not continue to monitor at this time unless further symptoms arise. 6. Iron deficiency anemia.    Drop in hemoglobin noted overnight, but with no overt bleeding.  Monitor CBC in a.m. Continue ferrous sulfate. 7. AAA.  Continue to monitor with vascular surgery in the outpatient setting.   DVT prophylaxis:Lovenox Code Status: DNR Family Communication: Personal sitter at bedside Disposition Plan: Continue ongoing pneumonia treatment and will discontinue cefepime in favor of Augmentin today.  Continue to work on chest congestion with addition of Mucinex and flutter valve.  Anticipate discharge in 24 hours if improved.   Consultants:   None  Procedures:   None  Antimicrobials:   Cefepime and Vancomycin 9/2->9/3  Augmentin 9/3->   Subjective: Patient seen and evaluated today with no new acute complaints or concerns. No acute concerns or events noted overnight.  He states that he is been doing well overnight and has had a bowel movement.  He continues to have ongoing productive cough and chest congestion.  Objective: Vitals:   01/12/18 2000 01/12/18 2110 01/13/18 0000 01/13/18 0457  BP: 102/75 (!) 92/58  102/84  Pulse: 75 78  67  Resp: (!) 24 (!) 22 18 18   Temp:  98.1 F (36.7 C)  98.3 F (36.8 C)  TempSrc:  Oral  Oral  SpO2: 95% 100%  97%  Weight:  65.7 kg    Height:  5' 7.01" (1.702 m)  Intake/Output Summary (Last 24 hours) at 01/13/2018 1049 Last data filed at 01/13/2018 0600 Gross per 24 hour  Intake 240 ml  Output -  Net 240 ml   Filed Weights   01/12/18 1601 01/12/18 2110  Weight: 65.3 kg 65.7 kg    Examination:  General exam: Appears calm and comfortable  Respiratory system: Clear to auscultation. Respiratory effort normal.  On 1 L nasal cannula. Cardiovascular system: S1 & S2 heard, RRR. No JVD, murmurs, rubs, gallops or clicks. No pedal edema. Gastrointestinal system: Abdomen is nondistended,  soft and nontender. No organomegaly or masses felt. Normal bowel sounds heard. Central nervous system: Alert and oriented. No focal neurological deficits. Extremities: Symmetric 5 x 5 power. Skin: No rashes, lesions or ulcers Psychiatry: Judgement and insight appear normal. Mood & affect appropriate.     Data Reviewed: I have personally reviewed following labs and imaging studies  CBC: Recent Labs  Lab 01/12/18 1812 01/13/18 0646  WBC 14.4* 11.6*  NEUTROABS 10.3*  --   HGB 11.0* 9.4*  HCT 33.1* 29.4*  MCV 84.9 85.0  PLT 201 809   Basic Metabolic Panel: Recent Labs  Lab 01/12/18 1812 01/13/18 0646  NA 134* 139  K 4.0 3.2*  CL 103 106  CO2 21* 26  GLUCOSE 148* 109*  BUN 24* 20  CREATININE 1.21 1.08  CALCIUM 8.6* 8.1*  MG  --  2.0   GFR: Estimated Creatinine Clearance: 37.2 mL/min (by C-G formula based on SCr of 1.08 mg/dL). Liver Function Tests: Recent Labs  Lab 01/12/18 1812  AST 44*  ALT 21  ALKPHOS 139*  BILITOT 1.6*  PROT 7.3  ALBUMIN 3.4*   No results for input(s): LIPASE, AMYLASE in the last 168 hours. No results for input(s): AMMONIA in the last 168 hours. Coagulation Profile: No results for input(s): INR, PROTIME in the last 168 hours. Cardiac Enzymes: Recent Labs  Lab 01/12/18 1813  TROPONINI 0.05*   BNP (last 3 results) No results for input(s): PROBNP in the last 8760 hours. HbA1C: No results for input(s): HGBA1C in the last 72 hours. CBG: No results for input(s): GLUCAP in the last 168 hours. Lipid Profile: No results for input(s): CHOL, HDL, LDLCALC, TRIG, CHOLHDL, LDLDIRECT in the last 72 hours. Thyroid Function Tests: No results for input(s): TSH, T4TOTAL, FREET4, T3FREE, THYROIDAB in the last 72 hours. Anemia Panel: No results for input(s): VITAMINB12, FOLATE, FERRITIN, TIBC, IRON, RETICCTPCT in the last 72 hours. Sepsis Labs: Recent Labs  Lab 01/12/18 1812 01/13/18 0646  PROCALCITON 0.15  --   LATICACIDVEN 1.6 1.2    No  results found for this or any previous visit (from the past 240 hour(s)).       Radiology Studies: Dg Chest 2 View  Result Date: 01/12/2018 CLINICAL DATA:  Patient with worsening shortness of breath. EXAM: CHEST - 2 VIEW COMPARISON:  Chest radiograph 12/13/2017 FINDINGS: Multi lead pacer apparatus overlies the left hemithorax, leads are stable in position. Stable cardiomegaly. Tortuosity and calcification of the thoracic aorta. Coarse bilateral interstitial opacities. Heterogeneous opacities right lung base. Small bilateral pleural effusions. Thoracic spine degenerative changes. IMPRESSION: Cardiomegaly. Coarse bilateral interstitial opacities may represent mild edema. Heterogeneous opacities right lung base may represent atelectasis or infection. Electronically Signed   By: Lovey Newcomer M.D.   On: 01/12/2018 17:02        Scheduled Meds: . amoxicillin-clavulanate  1 tablet Oral Q12H  . dextromethorphan  30 mg Oral Daily  . enoxaparin (LOVENOX) injection  40 mg Subcutaneous  Q24H  . ferrous sulfate  325 mg Oral Daily  . furosemide  40 mg Oral Daily  . guaiFENesin  600 mg Oral BID  . loratadine  10 mg Oral Daily  . metoprolol succinate  12.5 mg Oral Daily  . potassium chloride SA  40 mEq Oral Daily  . sodium chloride flush  3 mL Intravenous Q12H   Continuous Infusions: . sodium chloride       LOS: 1 day    Time spent: 30 minutes    Eivan Gallina Darleen Crocker, DO [facility] Pager [phone]  If 7PM-7AM, please contact night-coverage [email or web address] Password [email or web address] 01/13/2018, 10:49 AM

## 2018-01-13 NOTE — Progress Notes (Addendum)
   01/13/18 1318  Vitals  BP 93/65  MAP (mmHg) 76  BP Method Automatic  Patient Position (if appropriate) Sitting  Pulse Rate 70  Pulse Rate Source Monitor  Oxygen Therapy  SpO2 99 %  patient reports feeling short of breath and not getting air in when deep breathing.  Dr. [name] notified.  Breathing treatments ordered. RT called and notified.

## 2018-01-13 NOTE — Progress Notes (Signed)
Initial Nutrition Assessment  DOCUMENTATION CODES:  Non-severe (moderate) malnutrition in context of acute illness/injury  INTERVENTION:  Ensure Enlive po BID, each supplement provides 350 kcal and 20 grams of protein  Please note, patient does take metamucil qd at home. Normally has BM each day. Not currently on bowel regimen. Would consider beginning pt on one.   NUTRITION DIAGNOSIS:  Inadequate oral intake related to acute illness(2x hospitalizations in <1 month) as evidenced by loss of 5% bw x 1 month and mild-mod muscle/fat wasting  GOAL:  Patient will meet greater than or equal to 90% of their needs  MONITOR:  PO intake, Supplement acceptance, Labs, Weight trends, I & O's   REASON FOR ASSESSMENT:  Malnutrition Screening Tool    ASSESSMENT:  84 y/o male PMHx CHF, pacemaker, IDA, GERD, AAA. Presented to ED with worsening SOB and productive cough x3-4 days. Workup revealed RLL infiltrate, mild pulmonary edema and elevated BNP. Met sepsis criteria. Admitted for sepsis r/t PNA.    Patient getting breathing treatment on RD arrival. His private sitter is at bedside and she provides most of information.   Despite the patients acute illness, sitter reports patient has been eating well recently. Pt says the food today "wouldn't go down", but sitter clarifies he did not eat well at lunch because the spaghetti would congeal in mouth and make swallowing difficult. He has not had any other issues with trouble swallowing. Pt notes he does drink Ensure at home.   Wt wise, pt says his recent UBW has been 147 lbs. Per chart, he was 153 lbs exactly one month ago and he had largely maintained his weight ~150 lbs for the past couple years. He was now admitted at 144.8 lbs. This is just over a 5% loss of bw x 5 months, significant for malnutrition criteria.   At this time, he denies any n/v/d. He notes he typically has a BM qd, but has not yet had one today. He takes metamucil regularly for this at  home. He is agreeable to oral supplements.  Physical Exam: Mild/Mod muscle wasting. Mild edema.   Labs: K: 3.2, Albumin:3.4, Glu: 109, WBC:11.6 Meds: PO abx, iron, Lasix, kcl,   Recent Labs  Lab 01/12/18 1812 01/13/18 0646  NA 134* 139  K 4.0 3.2*  CL 103 106  CO2 21* 26  BUN 24* 20  CREATININE 1.21 1.08  CALCIUM 8.6* 8.1*  MG  --  2.0  GLUCOSE 148* 109*   NUTRITION - FOCUSED PHYSICAL EXAM:   Most Recent Value  Orbital Region  No depletion  Upper Arm Region  Mild depletion  Thoracic and Lumbar Region  Moderate depletion  Buccal Region  No depletion  Temple Region  No depletion  Clavicle Bone Region  Moderate depletion  Clavicle and Acromion Bone Region  Moderate depletion  Scapular Bone Region  Mild depletion  Dorsal Hand  No depletion  Patellar Region  No depletion  Anterior Thigh Region  No depletion  Posterior Calf Region  No depletion  Edema (RD Assessment)  Mild     Diet Order:   Diet Order            Diet Heart Room service appropriate? Yes; Fluid consistency: Thin; Fluid restriction: 1500 mL Fluid  Diet effective now             EDUCATION NEEDS:  No education needs have been identified at this time  Skin:  MASD to mid sacrum  Last BM:  9/2  Height:  Ht Readings from Last 1 Encounters:  01/12/18 5' 7.01" (1.702 m)   Weight:  Wt Readings from Last 1 Encounters:  01/12/18 65.7 kg   Wt Readings from Last 10 Encounters:  01/12/18 65.7 kg  01/01/18 65.9 kg  12/30/17 67 kg  12/29/17 69.4 kg  12/18/17 70.2 kg  11/17/17 68.5 kg  11/07/17 68.5 kg  10/29/17 66.1 kg  10/10/17 67.9 kg  07/01/17 68.6 kg   Ideal Body Weight:  67.27 kg  BMI:  Body mass index is 22.68 kg/m.  Estimated Nutritional Needs:  Kcal:  1800-1950 kcals (27-30 kcal/kg bw) Protein:  85-100g pro (1.3-1.5 g/kg bw) Fluid:  <1.5 L per MD fluid restricition   Burtis Junes RD, LDN, CNSC Clinical Nutrition Available Tues-Sat via Pager: [phone] 01/13/2018 3:11 PM

## 2018-01-14 ENCOUNTER — Ambulatory Visit: Payer: Self-pay

## 2018-01-14 DIAGNOSIS — J189 Pneumonia, unspecified organism: Secondary | ICD-10-CM

## 2018-01-14 DIAGNOSIS — I441 Atrioventricular block, second degree: Secondary | ICD-10-CM

## 2018-01-14 DIAGNOSIS — I5022 Chronic systolic (congestive) heart failure: Secondary | ICD-10-CM

## 2018-01-14 DIAGNOSIS — R0602 Shortness of breath: Secondary | ICD-10-CM

## 2018-01-14 DIAGNOSIS — E440 Moderate protein-calorie malnutrition: Secondary | ICD-10-CM

## 2018-01-14 DIAGNOSIS — R748 Abnormal levels of other serum enzymes: Secondary | ICD-10-CM

## 2018-01-14 LAB — CBC
HEMATOCRIT: 30.5 % — AB (ref 39.0–52.0)
Hemoglobin: 10.3 g/dL — ABNORMAL LOW (ref 13.0–17.0)
MCH: 28.7 pg (ref 26.0–34.0)
MCHC: 33.8 g/dL (ref 30.0–36.0)
MCV: 85 fL (ref 78.0–100.0)
Platelets: 193 10*3/uL (ref 150–400)
RBC: 3.59 MIL/uL — AB (ref 4.22–5.81)
RDW: 17.7 % — ABNORMAL HIGH (ref 11.5–15.5)
WBC: 13.7 10*3/uL — AB (ref 4.0–10.5)

## 2018-01-14 LAB — URINE CULTURE: Culture: <10000 — AB

## 2018-01-14 LAB — BASIC METABOLIC PANEL
ANION GAP: 6 (ref 5–15)
BUN: 22 mg/dL (ref 8–23)
CHLORIDE: 104 mmol/L (ref 98–111)
CO2: 27 mmol/L (ref 22–32)
Calcium: 8.4 mg/dL — ABNORMAL LOW (ref 8.9–10.3)
Creatinine, Ser: 0.95 mg/dL (ref 0.61–1.24)
GFR calc Af Amer: >60 mL/min (ref >60)
GFR calc non Af Amer: >60 mL/min (ref >60)
GLUCOSE: 122 mg/dL — AB (ref 70–99)
POTASSIUM: 3.4 mmol/L — AB (ref 3.5–5.1)
Sodium: 137 mmol/L (ref 135–145)

## 2018-01-14 LAB — MAGNESIUM: Magnesium: 2.1 mg/dL (ref 1.7–2.4)

## 2018-01-14 MED ORDER — ENSURE ENLIVE PO LIQD: 237.0000 mL | Freq: Two times a day (BID) | ORAL | Status: AC

## 2018-01-14 MED ORDER — BENZONATATE 100 MG PO CAPS: 100.0000 mg | ORAL_CAPSULE | Freq: Three times a day (TID) | ORAL | 0 refills | Status: DC | PRN

## 2018-01-14 MED ORDER — AMOXICILLIN-POT CLAVULANATE 875-125 MG PO TABS: 1.0000 | ORAL_TABLET | Freq: Two times a day (BID) | ORAL | 0 refills | Status: DC

## 2018-01-14 MED ORDER — GUAIFENESIN ER 600 MG PO TB12: 600.0000 mg | ORAL_TABLET | Freq: Two times a day (BID) | ORAL | 0 refills | Status: DC

## 2018-01-14 NOTE — Progress Notes (Signed)
SATURATION QUALIFICATIONS: (This note is used to comply with regulatory documentation for home oxygen)  Patient Saturations on Room Air at Rest = 94%  Patient Saturations on Room Air while Ambulating = 91%  Patient Saturations on 0 Liters of oxygen while Ambulating N/A  Please briefly explain why patient needs home oxygen:

## 2018-01-14 NOTE — Progress Notes (Signed)
Discharge instructions (including medications) discussed with and copy provided to patient/caregiver 

## 2018-01-14 NOTE — Discharge Summary (Signed)
Physician Discharge Summary  Shawn Frye KVQ:[id number] DOB: 1919-05-03 DOA: 01/12/2018  PCP: Shawn Squibb, MD  Admit date: 01/12/2018 Discharge date: 01/14/2018  Time spent: 35 minutes  Recommendations for Outpatient Follow-up:  1. Repeat basic metabolic panel to follow electrolytes and renal function 2. Repeat CBC to follow hemoglobin trend 3. Repeat chest x-ray 8 in 6-week to assure complete resolution of lungs infiltrates. 4. Reassess blood pressure and further adjust antihypertensive regimen as needed.   Discharge Diagnoses:  Principal Problem:   Pneumonia Active Problems:   Aneurysm of abdominal vessel (HCC)   Mobitz type 2 second degree atrioventricular block   Pacemaker   HCAP (healthcare-associated pneumonia)   Chronic systolic CHF (congestive heart failure) (HCC)   Elevated troponin   Malnutrition of moderate degree   Shortness of breath   Discharge Condition: Stable and improved.  Patient discharged home with instruction to follow-up with PCP in 10 days.  Diet recommendation: Heart healthy diet.  Filed Weights   01/12/18 1601 01/12/18 2110  Weight: 65.3 kg 65.7 kg    History of present illness:  H&P written by Dr. Manuella Frye on 01/12/2018 84 y.o. male with medical history significant for cardiomyopathy with systolic congestive heart failure and LVEF 20%, cardiac pacemaker with history of second-degree AV block, AAA, and iron deficiency anemia who presented to the ED with complaints of gradual worsening of shortness of breath and productive cough over the last 3 to 4 days.  He also complains of some pleuritic chest pain to the right rib cage that is worsened with deep breathing and coughing. He has noted some yellowish sputum, but no hemoptysis.  He was recently discharged from a skilled nursing facility about the same time.  He has had some associated chills, but no fever and has also exhibited some generalized weakness.  He has had no lower extremity edema, orthopnea,  paroxysmal nocturnal dyspnea, chest pain, palpitations, abdominal pain, nausea, vomiting, or diarrhea.  Patient denies any weight gain and has been taking his medications as prescribed.  Hospital Course:  1-sepsis secondary to pneumonia: -Sepsis features resolved -Patient received initially broad-spectrum antibiotics using cefepime and vancomycin. -At discharge will complete therapy using Augmentin -No requiring oxygen supplementation and has been instructed to continue the use of Mucinex and as needed Tessalon Perles. -Follow-up with PCP in 10 days. -Advised to keep himself well-hydrated.  2-chronic systolic CHF: -Mild fluid vascular congestion on chest x-ray 8 but the patient on physical exam do not appears to be in any acute heart failure exacerbation.  BNP is elevated however this is not uncommon in a patient with left ventricular ejection fraction of less than 20%. -Instructed to follow low-sodium diet, daily weights and to resume home Lasix and metoprolol as previously prescribed by his cardiologist. -Outpatient follow-up with cardiology service.  3-hypokalemia -Continue maintenance supplementation. -Potassium 3.4 at discharge. -Recommending to repeat basic metabolic panel to follow electrolytes trend at his follow-up visit.  4-mild hyponatremia: In the setting of chronic diuretics and poor oral intake; also with pneumonia most likely causing SIADH. -Resolved by the time of discharge -Patient advised to keep himself well-hydrated -Complete treatment for pneumonia as mentioned above. -Repeat basic metabolic panel during follow-up visit to reassess electrolytes trend.  5-elevated troponin: Patient denies any chest pain and there is no acute ischemic changes on telemetry or EKG. -Most likely secondary to poor left ventricular function and acute infection causing demand ischemia. -Continue beta-blocker  6-moderate protein calorie malnutrition -Ensure has been recommended as per  nutritional  service feedback. -Patient advised to keep himself well-hydrated.  7-iron deficiency anemia -No signs of overt bleeding appreciated during this admission -Will recommend repeating CBC at follow-up visit to reassess hemoglobin trend. -Continue ferrous sulfate.  8-AAA -Continue to monitor with vascular surgery in the outpatient setting. -Patient denies abdominal pain and is hemodynamically stable at discharge.  9-second-degree AV block -Heart rate well controlled and properly paced -Status post [employer] pacemaker in May 2017 -Continue outpatient follow-up with Dr. [name] (electrophysiologist).  Procedures:  See below for x-ray reports.  Consultations:  None   Discharge Exam: Vitals:   01/14/18 1337 01/14/18 1659  BP: 98/72   Pulse: (!) 109 (!) 101  Resp: 20   Temp: 97.8 F (36.6 C)   SpO2: 95%     General: Afebrile, no chest pain, reports breathing significantly improved but no requiring oxygen supplementation at discharge.  Still with some nonproductive cough. Cardiovascular: S1 and S2, positive soft systolic ejection murmur, no rubs, no gallops, no JVD. Respiratory: Scattered rhonchi, no wheezing, improved air movement bilaterally.  Good oxygen saturation on room air. Abdomen: Soft, nontender, nondistended; normal bowel sounds.   Discharge Instructions   Discharge Instructions    Diet - low sodium heart healthy   Complete by:  As directed    Discharge instructions   Complete by:  As directed    Take medications as prescribed Maintain adequate hydration Follow heart healthy diet (less than 2 g of sodium daily basis). Arrange follow-up with PCP in 10 days Change weight on daily basis.     Allergies as of 01/14/2018      Reactions   Percocet [oxycodone-acetaminophen] Hives   Noroxin [norfloxacin] Nausea And Vomiting   Celebrex [celecoxib] Rash      Medication List    STOP taking these medications   DELSYM 30 MG/5ML liquid Generic drug:   dextromethorphan     TAKE these medications   ALPRAZolam 0.25 MG tablet Commonly known as:  XANAX Take 0.125-0.25 mg by mouth daily as needed for anxiety.   amoxicillin-clavulanate 875-125 MG tablet Commonly known as:  AUGMENTIN Take 1 tablet by mouth every 12 (twelve) hours.   benzonatate 100 MG capsule Commonly known as:  TESSALON Take 1 capsule (100 mg total) by mouth 3 (three) times daily as needed for cough.   feeding supplement (ENSURE ENLIVE) Liqd Take 237 mLs by mouth 2 (two) times daily between meals. Start taking on:  01/15/2018   ferrous sulfate 325 (65 FE) MG EC tablet Take 1 tablet by mouth daily.   furosemide 40 MG tablet Commonly known as:  LASIX Take 1 tablet (40 mg total) by mouth daily.   guaiFENesin 600 MG 12 hr tablet Commonly known as:  MUCINEX Take 1 tablet (600 mg total) by mouth 2 (two) times daily.   loratadine 10 MG tablet Commonly known as:  CLARITIN Take 10 mg by mouth daily.   metoprolol succinate 25 MG 24 hr tablet Commonly known as:  TOPROL-XL Take 12.5 mg by mouth daily.   potassium chloride SA 20 MEQ tablet Commonly known as:  K-DUR,KLOR-CON Take 2 tablets (40 mEq total) by mouth daily.      Allergies  Allergen Reactions  . Percocet [Oxycodone-Acetaminophen] Hives  . Noroxin [Norfloxacin] Nausea And Vomiting  . Celebrex [Celecoxib] Rash   Follow-up Information    Shawn Squibb, MD. Schedule an appointment as soon as possible for a visit in 10 day(s).   Specialty:  Internal Medicine Contact information: [place]  [place] [address] [phone]        Satira Sark, MD .   Specialty:  Cardiology Contact information: [place] [address] [phone]           The results of significant diagnostics from this hospitalization (including imaging, microbiology, ancillary and laboratory) are listed below for reference.    Significant Diagnostic Studies: Dg Chest 2 View  Result  Date: 01/12/2018 CLINICAL DATA:  Patient with worsening shortness of breath. EXAM: CHEST - 2 VIEW COMPARISON:  Chest radiograph 12/13/2017 FINDINGS: Multi lead pacer apparatus overlies the left hemithorax, leads are stable in position. Stable cardiomegaly. Tortuosity and calcification of the thoracic aorta. Coarse bilateral interstitial opacities. Heterogeneous opacities right lung base. Small bilateral pleural effusions. Thoracic spine degenerative changes. IMPRESSION: Cardiomegaly. Coarse bilateral interstitial opacities may represent mild edema. Heterogeneous opacities right lung base may represent atelectasis or infection. Electronically Signed   By: Lovey Newcomer M.D.   On: 01/12/2018 17:02    Microbiology: Recent Results (from the past 240 hour(s))  Urine culture     Status: Abnormal   Collection Time: 01/12/18  7:50 PM  Result Value Ref Range Status   Specimen Description   Final    URINE, CLEAN CATCH Performed at [facility], [address], [place], [place] [address]    Culture (A)  Final    <10,000 COLONIES/mL INSIGNIFICANT GROWTH Performed at [facility] [address], [place], [place] [address]    Report Status 01/14/2018 FINAL  Final     Labs: Basic Metabolic Panel: Recent Labs  Lab 01/12/18 1812 01/13/18 0646 01/14/18 0611  NA 134* 139 137  K 4.0 3.2* 3.4*  CL 103 106 104  CO2 21* 26 27  GLUCOSE 148* 109* 122*  BUN 24* 20 22  CREATININE 1.21 1.08 0.95  CALCIUM 8.6* 8.1* 8.4*  MG  --  2.0 2.1   Liver Function Tests: Recent Labs  Lab 01/12/18 1812  AST 44*  ALT 21  ALKPHOS 139*  BILITOT 1.6*  PROT 7.3  ALBUMIN 3.4*   CBC: Recent Labs  Lab 01/12/18 1812 01/13/18 0646 01/14/18 0611  WBC 14.4* 11.6* 13.7*  NEUTROABS 10.3*  --   --   HGB 11.0* 9.4* 10.3*  HCT 33.1* 29.4* 30.5*  MCV 84.9 85.0 85.0  PLT 201 168 193   Cardiac Enzymes: Recent Labs  Lab 01/12/18 1813  TROPONINI 0.05*   BNP: BNP (last 3 results) Recent Labs     12/13/17 1519 01/12/18 1813  BNP 1,751.0* 3,053.0*    Signed:  Barton Dubois MD.  [facility] 01/14/2018, 5:20 PM

## 2018-01-14 NOTE — Care Management Note (Signed)
Case Management Note  Patient Details  Name: Shawn Frye MRN: [id number] Date of Birth: [birth year]  Subjective/Objective:       Admitted with pneumonia. Pt from home, has aid services during the day and is alone at night. Pt feels safe to return to this environment with same care giving plan. Sitter at bedside planning. Active with [facility] advancing you home program. Pt able to ambulate short distances, has ambulated with staff today in preporation for DC. Will have RW and  BSC if needed.           Action/Plan: DC home with resumption of [facility] services. Aware HH has 48 hrs to resume services. Vaughan Basta, [facility] rep, aware of DC plan. Potential for DC today.    Expected Discharge Date:  01/14/18               Expected Discharge Plan:  [place]  In-House Referral:  NA  Discharge planning Services  CM Consult  Post Acute Care Choice:  Home Health, Resumption of Svcs/PTA Provider Choice offered to:  Patient  DME Arranged:    DME Agency:     HH Arranged:  RN, PT [place] Agency:  [facility]  Status of Service:  Completed, signed off  If discussed at [place] of Stay Meetings, dates discussed:    Additional Comments:  Sherald Barge, RN 01/14/2018, 3:58 PM

## 2018-01-14 NOTE — Plan of Care (Signed)

## 2018-01-15 ENCOUNTER — Ambulatory Visit: Payer: Medicare Other | Admitting: Physician Assistant

## 2018-01-15 ENCOUNTER — Ambulatory Visit: Payer: Self-pay | Admitting: *Deleted

## 2018-01-15 DIAGNOSIS — E871 Hypo-osmolality and hyponatremia: Secondary | ICD-10-CM | POA: Diagnosis not present

## 2018-01-15 DIAGNOSIS — J9601 Acute respiratory failure with hypoxia: Secondary | ICD-10-CM | POA: Diagnosis not present

## 2018-01-15 DIAGNOSIS — I714 Abdominal aortic aneurysm, without rupture: Secondary | ICD-10-CM | POA: Diagnosis not present

## 2018-01-15 DIAGNOSIS — I5043 Acute on chronic combined systolic (congestive) and diastolic (congestive) heart failure: Secondary | ICD-10-CM | POA: Diagnosis not present

## 2018-01-15 DIAGNOSIS — I420 Dilated cardiomyopathy: Secondary | ICD-10-CM | POA: Diagnosis not present

## 2018-01-15 DIAGNOSIS — I442 Atrioventricular block, complete: Secondary | ICD-10-CM | POA: Diagnosis not present

## 2018-01-16 DIAGNOSIS — E871 Hypo-osmolality and hyponatremia: Secondary | ICD-10-CM | POA: Diagnosis not present

## 2018-01-16 DIAGNOSIS — J9601 Acute respiratory failure with hypoxia: Secondary | ICD-10-CM | POA: Diagnosis not present

## 2018-01-16 DIAGNOSIS — I714 Abdominal aortic aneurysm, without rupture: Secondary | ICD-10-CM | POA: Diagnosis not present

## 2018-01-16 DIAGNOSIS — I420 Dilated cardiomyopathy: Secondary | ICD-10-CM | POA: Diagnosis not present

## 2018-01-16 DIAGNOSIS — I5043 Acute on chronic combined systolic (congestive) and diastolic (congestive) heart failure: Secondary | ICD-10-CM | POA: Diagnosis not present

## 2018-01-16 DIAGNOSIS — I442 Atrioventricular block, complete: Secondary | ICD-10-CM | POA: Diagnosis not present

## 2018-01-19 DIAGNOSIS — I5043 Acute on chronic combined systolic (congestive) and diastolic (congestive) heart failure: Secondary | ICD-10-CM | POA: Diagnosis not present

## 2018-01-19 DIAGNOSIS — I714 Abdominal aortic aneurysm, without rupture: Secondary | ICD-10-CM | POA: Diagnosis not present

## 2018-01-19 DIAGNOSIS — I420 Dilated cardiomyopathy: Secondary | ICD-10-CM | POA: Diagnosis not present

## 2018-01-19 DIAGNOSIS — I442 Atrioventricular block, complete: Secondary | ICD-10-CM | POA: Diagnosis not present

## 2018-01-19 DIAGNOSIS — J9601 Acute respiratory failure with hypoxia: Secondary | ICD-10-CM | POA: Diagnosis not present

## 2018-01-19 DIAGNOSIS — E871 Hypo-osmolality and hyponatremia: Secondary | ICD-10-CM | POA: Diagnosis not present

## 2018-01-20 ENCOUNTER — Ambulatory Visit: Payer: Self-pay | Admitting: *Deleted

## 2018-01-21 DIAGNOSIS — I420 Dilated cardiomyopathy: Secondary | ICD-10-CM | POA: Diagnosis not present

## 2018-01-21 DIAGNOSIS — I714 Abdominal aortic aneurysm, without rupture: Secondary | ICD-10-CM | POA: Diagnosis not present

## 2018-01-21 DIAGNOSIS — I5043 Acute on chronic combined systolic (congestive) and diastolic (congestive) heart failure: Secondary | ICD-10-CM | POA: Diagnosis not present

## 2018-01-21 DIAGNOSIS — I442 Atrioventricular block, complete: Secondary | ICD-10-CM | POA: Diagnosis not present

## 2018-01-21 DIAGNOSIS — E871 Hypo-osmolality and hyponatremia: Secondary | ICD-10-CM | POA: Diagnosis not present

## 2018-01-21 DIAGNOSIS — J9601 Acute respiratory failure with hypoxia: Secondary | ICD-10-CM | POA: Diagnosis not present

## 2018-01-22 DIAGNOSIS — I5043 Acute on chronic combined systolic (congestive) and diastolic (congestive) heart failure: Secondary | ICD-10-CM | POA: Diagnosis not present

## 2018-01-22 DIAGNOSIS — I442 Atrioventricular block, complete: Secondary | ICD-10-CM | POA: Diagnosis not present

## 2018-01-22 DIAGNOSIS — I714 Abdominal aortic aneurysm, without rupture: Secondary | ICD-10-CM | POA: Diagnosis not present

## 2018-01-22 DIAGNOSIS — E871 Hypo-osmolality and hyponatremia: Secondary | ICD-10-CM | POA: Diagnosis not present

## 2018-01-22 DIAGNOSIS — I420 Dilated cardiomyopathy: Secondary | ICD-10-CM | POA: Diagnosis not present

## 2018-01-22 DIAGNOSIS — R0602 Shortness of breath: Secondary | ICD-10-CM | POA: Diagnosis not present

## 2018-01-22 DIAGNOSIS — J9601 Acute respiratory failure with hypoxia: Secondary | ICD-10-CM | POA: Diagnosis not present

## 2018-01-23 ENCOUNTER — Emergency Department (HOSPITAL_COMMUNITY): Payer: Medicare Other

## 2018-01-23 ENCOUNTER — Other Ambulatory Visit: Payer: Self-pay

## 2018-01-23 ENCOUNTER — Encounter (HOSPITAL_COMMUNITY): Payer: Self-pay | Admitting: Emergency Medicine

## 2018-01-23 ENCOUNTER — Inpatient Hospital Stay (HOSPITAL_COMMUNITY)
Admission: EM | Admit: 2018-01-23 | Discharge: 2018-01-29 | DRG: 291 | Disposition: A | Discharge status: Home Health | Payer: Medicare Other | Attending: Internal Medicine | Admitting: Internal Medicine

## 2018-01-23 DIAGNOSIS — Z9842 Cataract extraction status, left eye: Secondary | ICD-10-CM

## 2018-01-23 DIAGNOSIS — R1310 Dysphagia, unspecified: Secondary | ICD-10-CM

## 2018-01-23 DIAGNOSIS — J9601 Acute respiratory failure with hypoxia: Secondary | ICD-10-CM | POA: Diagnosis not present

## 2018-01-23 DIAGNOSIS — I714 Abdominal aortic aneurysm, without rupture, unspecified: Secondary | ICD-10-CM | POA: Diagnosis present

## 2018-01-23 DIAGNOSIS — Z888 Allergy status to other drugs, medicaments and biological substances status: Secondary | ICD-10-CM

## 2018-01-23 DIAGNOSIS — M1990 Unspecified osteoarthritis, unspecified site: Secondary | ICD-10-CM | POA: Diagnosis present

## 2018-01-23 DIAGNOSIS — Z9841 Cataract extraction status, right eye: Secondary | ICD-10-CM

## 2018-01-23 DIAGNOSIS — I509 Heart failure, unspecified: Secondary | ICD-10-CM

## 2018-01-23 DIAGNOSIS — Z885 Allergy status to narcotic agent status: Secondary | ICD-10-CM

## 2018-01-23 DIAGNOSIS — Z961 Presence of intraocular lens: Secondary | ICD-10-CM | POA: Diagnosis present

## 2018-01-23 DIAGNOSIS — I5082 Biventricular heart failure: Secondary | ICD-10-CM | POA: Diagnosis present

## 2018-01-23 DIAGNOSIS — R1314 Dysphagia, pharyngoesophageal phase: Secondary | ICD-10-CM | POA: Diagnosis present

## 2018-01-23 DIAGNOSIS — K219 Gastro-esophageal reflux disease without esophagitis: Secondary | ICD-10-CM | POA: Diagnosis present

## 2018-01-23 DIAGNOSIS — Z23 Encounter for immunization: Secondary | ICD-10-CM

## 2018-01-23 DIAGNOSIS — I442 Atrioventricular block, complete: Secondary | ICD-10-CM | POA: Diagnosis not present

## 2018-01-23 DIAGNOSIS — I071 Rheumatic tricuspid insufficiency: Secondary | ICD-10-CM | POA: Diagnosis present

## 2018-01-23 DIAGNOSIS — D509 Iron deficiency anemia, unspecified: Secondary | ICD-10-CM | POA: Diagnosis present

## 2018-01-23 DIAGNOSIS — J439 Emphysema, unspecified: Secondary | ICD-10-CM | POA: Diagnosis present

## 2018-01-23 DIAGNOSIS — Z96653 Presence of artificial knee joint, bilateral: Secondary | ICD-10-CM | POA: Diagnosis present

## 2018-01-23 DIAGNOSIS — Z6821 Body mass index (BMI) 21.0-21.9, adult: Secondary | ICD-10-CM

## 2018-01-23 DIAGNOSIS — I5043 Acute on chronic combined systolic (congestive) and diastolic (congestive) heart failure: Secondary | ICD-10-CM | POA: Diagnosis not present

## 2018-01-23 DIAGNOSIS — K224 Dyskinesia of esophagus: Secondary | ICD-10-CM | POA: Diagnosis present

## 2018-01-23 DIAGNOSIS — I420 Dilated cardiomyopathy: Secondary | ICD-10-CM | POA: Diagnosis present

## 2018-01-23 DIAGNOSIS — Z950 Presence of cardiac pacemaker: Secondary | ICD-10-CM

## 2018-01-23 DIAGNOSIS — J90 Pleural effusion, not elsewhere classified: Secondary | ICD-10-CM | POA: Diagnosis not present

## 2018-01-23 DIAGNOSIS — K2970 Gastritis, unspecified, without bleeding: Secondary | ICD-10-CM | POA: Diagnosis present

## 2018-01-23 DIAGNOSIS — J984 Other disorders of lung: Secondary | ICD-10-CM | POA: Diagnosis not present

## 2018-01-23 DIAGNOSIS — R778 Other specified abnormalities of plasma proteins: Secondary | ICD-10-CM | POA: Diagnosis present

## 2018-01-23 DIAGNOSIS — Z87891 Personal history of nicotine dependence: Secondary | ICD-10-CM

## 2018-01-23 DIAGNOSIS — K222 Esophageal obstruction: Secondary | ICD-10-CM | POA: Diagnosis present

## 2018-01-23 DIAGNOSIS — Z8249 Family history of ischemic heart disease and other diseases of the circulatory system: Secondary | ICD-10-CM

## 2018-01-23 DIAGNOSIS — I5022 Chronic systolic (congestive) heart failure: Secondary | ICD-10-CM | POA: Diagnosis present

## 2018-01-23 DIAGNOSIS — Z96612 Presence of left artificial shoulder joint: Secondary | ICD-10-CM | POA: Diagnosis present

## 2018-01-23 DIAGNOSIS — I2510 Atherosclerotic heart disease of native coronary artery without angina pectoris: Secondary | ICD-10-CM | POA: Diagnosis present

## 2018-01-23 DIAGNOSIS — R7989 Other specified abnormal findings of blood chemistry: Secondary | ICD-10-CM

## 2018-01-23 DIAGNOSIS — E876 Hypokalemia: Secondary | ICD-10-CM | POA: Diagnosis not present

## 2018-01-23 DIAGNOSIS — F419 Anxiety disorder, unspecified: Secondary | ICD-10-CM | POA: Diagnosis present

## 2018-01-23 DIAGNOSIS — K766 Portal hypertension: Secondary | ICD-10-CM | POA: Diagnosis present

## 2018-01-23 DIAGNOSIS — E440 Moderate protein-calorie malnutrition: Secondary | ICD-10-CM | POA: Diagnosis not present

## 2018-01-23 DIAGNOSIS — K449 Diaphragmatic hernia without obstruction or gangrene: Secondary | ICD-10-CM | POA: Diagnosis present

## 2018-01-23 DIAGNOSIS — E871 Hypo-osmolality and hyponatremia: Secondary | ICD-10-CM | POA: Diagnosis not present

## 2018-01-23 DIAGNOSIS — Z881 Allergy status to other antibiotic agents status: Secondary | ICD-10-CM

## 2018-01-23 DIAGNOSIS — I441 Atrioventricular block, second degree: Secondary | ICD-10-CM | POA: Diagnosis not present

## 2018-01-23 DIAGNOSIS — Z8489 Family history of other specified conditions: Secondary | ICD-10-CM

## 2018-01-23 DIAGNOSIS — Z66 Do not resuscitate: Secondary | ICD-10-CM | POA: Diagnosis present

## 2018-01-23 LAB — CBC WITH DIFFERENTIAL/PLATELET
BASOS PCT: 0 %
Basophils Absolute: 0 10*3/uL (ref 0.0–0.1)
EOS ABS: 0.1 10*3/uL (ref 0.0–0.7)
Eosinophils Relative: 1 %
HCT: 33.1 % — ABNORMAL LOW (ref 39.0–52.0)
Hemoglobin: 10.6 g/dL — ABNORMAL LOW (ref 13.0–17.0)
Lymphocytes Relative: 14 %
Lymphs Abs: 1.2 10*3/uL (ref 0.7–4.0)
MCH: 27.3 pg (ref 26.0–34.0)
MCHC: 32 g/dL (ref 30.0–36.0)
MCV: 85.3 fL (ref 78.0–100.0)
MONOS PCT: 14 %
Monocytes Absolute: 1.2 10*3/uL — ABNORMAL HIGH (ref 0.1–1.0)
Neutro Abs: 6.4 10*3/uL (ref 1.7–7.7)
Neutrophils Relative %: 71 %
Platelets: 222 10*3/uL (ref 150–400)
RBC: 3.88 MIL/uL — ABNORMAL LOW (ref 4.22–5.81)
RDW: 18.3 % — AB (ref 11.5–15.5)
WBC: 9 10*3/uL (ref 4.0–10.5)

## 2018-01-23 LAB — COMPREHENSIVE METABOLIC PANEL
ALK PHOS: 94 U/L (ref 38–126)
ALT: 11 U/L (ref 0–44)
AST: 20 U/L (ref 15–41)
Albumin: 3.1 g/dL — ABNORMAL LOW (ref 3.5–5.0)
Anion gap: 7 (ref 5–15)
BILIRUBIN TOTAL: 0.9 mg/dL (ref 0.3–1.2)
BUN: 19 mg/dL (ref 8–23)
CO2: 25 mmol/L (ref 22–32)
CREATININE: 0.86 mg/dL (ref 0.61–1.24)
Calcium: 8.5 mg/dL — ABNORMAL LOW (ref 8.9–10.3)
Chloride: 104 mmol/L (ref 98–111)
GFR calc Af Amer: >60 mL/min (ref >60)
Glucose, Bld: 109 mg/dL — ABNORMAL HIGH (ref 70–99)
Potassium: 3.9 mmol/L (ref 3.5–5.1)
Sodium: 136 mmol/L (ref 135–145)
TOTAL PROTEIN: 7.8 g/dL (ref 6.5–8.1)

## 2018-01-23 LAB — URINALYSIS, ROUTINE W REFLEX MICROSCOPIC
Bacteria, UA: NONE SEEN
Bilirubin Urine: NEGATIVE
GLUCOSE, UA: NEGATIVE mg/dL
HGB URINE DIPSTICK: NEGATIVE
Ketones, ur: NEGATIVE mg/dL
Nitrite: NEGATIVE
Protein, ur: NEGATIVE mg/dL
SPECIFIC GRAVITY, URINE: 1.008 (ref 1.005–1.030)
pH: 6 (ref 5.0–8.0)

## 2018-01-23 LAB — BRAIN NATRIURETIC PEPTIDE: B Natriuretic Peptide: 2118 pg/mL — ABNORMAL HIGH (ref 0.0–100.0)

## 2018-01-23 LAB — TROPONIN I
TROPONIN I: 0.03 ng/mL — AB (ref <0.03)
TROPONIN I: 0.03 ng/mL — AB (ref <0.03)

## 2018-01-23 MED ORDER — GUAIFENESIN ER 600 MG PO TB12
600.0000 mg | ORAL_TABLET | Freq: Two times a day (BID) | ORAL | Status: DC
  Administered 2018-01-23 – 2018-01-29 (×12): 600 mg via ORAL
  Filled 2018-01-23 (×12): qty 1

## 2018-01-23 MED ORDER — ENSURE ENLIVE PO LIQD
237.0000 mL | Freq: Two times a day (BID) | ORAL | Status: DC
  Administered 2018-01-24 – 2018-01-29 (×8): 237 mL via ORAL

## 2018-01-23 MED ORDER — ALPRAZOLAM 0.25 MG PO TABS
0.1250 mg | ORAL_TABLET | Freq: Every day | ORAL | Status: DC | PRN
  Administered 2018-01-23 – 2018-01-25 (×3): 0.25 mg via ORAL
  Filled 2018-01-23 (×3): qty 1

## 2018-01-23 MED ORDER — IOPAMIDOL (ISOVUE-300) INJECTION 61%
100.0000 mL | Freq: Once | INTRAVENOUS | Status: AC | PRN
  Administered 2018-01-23: 75 mL via INTRAVENOUS

## 2018-01-23 MED ORDER — LORATADINE 10 MG PO TABS
ORAL_TABLET | ORAL | Status: AC
  Filled 2018-01-23: qty 1

## 2018-01-23 MED ORDER — POTASSIUM CHLORIDE CRYS ER 20 MEQ PO TBCR
40.0000 meq | EXTENDED_RELEASE_TABLET | Freq: Every day | ORAL | Status: DC
  Administered 2018-01-23: 20 meq via ORAL
  Administered 2018-01-24 – 2018-01-29 (×6): 40 meq via ORAL
  Filled 2018-01-23 (×7): qty 2

## 2018-01-23 MED ORDER — SODIUM CHLORIDE 0.9 % IV SOLN: 250.0000 mL | INTRAVENOUS | Status: DC | PRN

## 2018-01-23 MED ORDER — ENOXAPARIN SODIUM 40 MG/0.4ML ~~LOC~~ SOLN
40.0000 mg | SUBCUTANEOUS | Status: DC
  Administered 2018-01-23 – 2018-01-26 (×4): 40 mg via SUBCUTANEOUS
  Filled 2018-01-23 (×4): qty 0.4

## 2018-01-23 MED ORDER — BENZONATATE 100 MG PO CAPS: 100.0000 mg | ORAL_CAPSULE | Freq: Three times a day (TID) | ORAL | Status: DC | PRN

## 2018-01-23 MED ORDER — POTASSIUM CHLORIDE CRYS ER 20 MEQ PO TBCR
EXTENDED_RELEASE_TABLET | ORAL | Status: AC
  Administered 2018-01-23: 20 meq via ORAL
  Filled 2018-01-23: qty 2

## 2018-01-23 MED ORDER — ONDANSETRON HCL 4 MG PO TABS: 4.0000 mg | ORAL_TABLET | Freq: Four times a day (QID) | ORAL | Status: DC | PRN

## 2018-01-23 MED ORDER — LORATADINE 10 MG PO TABS
10.0000 mg | ORAL_TABLET | Freq: Every day | ORAL | Status: DC
  Administered 2018-01-23 – 2018-01-29 (×7): 10 mg via ORAL
  Filled 2018-01-23 (×6): qty 1

## 2018-01-23 MED ORDER — ENOXAPARIN SODIUM 40 MG/0.4ML ~~LOC~~ SOLN
SUBCUTANEOUS | Status: AC
  Filled 2018-01-23: qty 0.4

## 2018-01-23 MED ORDER — SODIUM CHLORIDE 0.9% FLUSH: 3.0000 mL | INTRAVENOUS | Status: DC | PRN

## 2018-01-23 MED ORDER — ONDANSETRON HCL 4 MG/2ML IJ SOLN: 4.0000 mg | Freq: Four times a day (QID) | INTRAMUSCULAR | Status: DC | PRN

## 2018-01-23 MED ORDER — SODIUM CHLORIDE 0.9% FLUSH
3.0000 mL | Freq: Two times a day (BID) | INTRAVENOUS | Status: DC
  Administered 2018-01-23 – 2018-01-29 (×8): 3 mL via INTRAVENOUS

## 2018-01-23 MED ORDER — ACETAMINOPHEN 650 MG RE SUPP: 650.0000 mg | Freq: Four times a day (QID) | RECTAL | Status: DC | PRN

## 2018-01-23 MED ORDER — FERROUS SULFATE 325 (65 FE) MG PO TABS
325.0000 mg | ORAL_TABLET | Freq: Every day | ORAL | Status: DC
  Administered 2018-01-23 – 2018-01-29 (×7): 325 mg via ORAL
  Filled 2018-01-23 (×10): qty 1

## 2018-01-23 MED ORDER — ALBUTEROL SULFATE (2.5 MG/3ML) 0.083% IN NEBU
5.0000 mg | INHALATION_SOLUTION | Freq: Once | RESPIRATORY_TRACT | Status: AC
  Administered 2018-01-23: 5 mg via RESPIRATORY_TRACT
  Filled 2018-01-23: qty 6

## 2018-01-23 MED ORDER — ACETAMINOPHEN 325 MG PO TABS
650.0000 mg | ORAL_TABLET | Freq: Four times a day (QID) | ORAL | Status: DC | PRN
  Administered 2018-01-23 – 2018-01-29 (×2): 650 mg via ORAL
  Filled 2018-01-23 (×2): qty 2

## 2018-01-23 MED ORDER — FUROSEMIDE 10 MG/ML IJ SOLN
4.0000 mg/h | INTRAVENOUS | Status: DC
  Administered 2018-01-23 – 2018-01-26 (×3): 4 mg/h via INTRAVENOUS
  Filled 2018-01-23: qty 20
  Filled 2018-01-23: qty 25

## 2018-01-23 MED ORDER — FUROSEMIDE 10 MG/ML IJ SOLN
INTRAMUSCULAR | Status: AC
  Filled 2018-01-23: qty 30

## 2018-01-23 NOTE — ED Notes (Signed)
Report to [employer], Therapist, sports

## 2018-01-23 NOTE — ED Notes (Signed)
Date and time results received: 01/23/18 1631 (use smartphrase ".now" to insert current time)  Test: troponin Critical Value: 0.03  Name of Provider Notified: Lita Mains MD  Orders Received? Or Actions Taken?: n/a

## 2018-01-23 NOTE — ED Triage Notes (Signed)
Patient complaining of shortness of breath x 2 days. Caregiver states patient was admitted for pneumonia and finished antibiotics. States mobile unit came yesterday and did chest x-ray and said he still had pneumonia.

## 2018-01-23 NOTE — Patient Outreach (Signed)
[facility]) Care Management  01/23/2018  Glennie Rodda [facility] [birth year] [id number]   Notification received that Mr. Eshbach discharged home. RN CM contacted Mr. Gasparini for outreach and transition of care services. Call answered by his personal aide who reported that he complained of shortness of breath, and was being evaluated in the Emergency Department.   PLAN Follow up pending discharge disposition.   [place] [phone]

## 2018-01-23 NOTE — ED Provider Notes (Signed)
[facility] EMERGENCY DEPARTMENT Provider Note   CSN: [id number] Arrival date & time: 01/23/18  1452     History   Chief Complaint Chief Complaint  Patient presents with  . Shortness of Breath    HPI Shawn Frye is a 84 y.o. male.  HPI Patient presents with worsening shortness of breath over the last 2 days.  This is had a cough minimally productive of sputum.  Shortness of breath is worse with lying flat or minor exertion.  No definite fever or chills.  Denies any chest pain.  Patient recently was hospitalized for pneumonia Past Medical History:  Diagnosis Date  . AAA (abdominal aortic aneurysm) ([place])   . Arthritis   . Chronic combined systolic (congestive) and diastolic (congestive) heart failure ([place])    a. 10/2017: echo showing a reduced EF of 25%, diffuse HK with akinesis of the apical myocardium, Grade 2 DD, mild AI, mild to moderate MR, and mild to moderate TR  . GERD (gastroesophageal reflux disease)   . Heart block AV second degree    [employer] pacemaker May 2017 - Dr. [name]  . Infection of bladder   . Joint pain   . Symptomatic bradycardia     Patient Active Problem List   Diagnosis Date Noted  . Pleural effusion on right   . Pleural effusion 01/23/2018  . CHF (congestive heart failure) ([place]) 01/23/2018  . Malnutrition of moderate degree 01/14/2018  . Shortness of breath   . HCAP (healthcare-associated pneumonia) 01/12/2018  . Pneumonia 01/12/2018  . Chronic systolic CHF (congestive heart failure) ([place]) 01/12/2018  . Elevated troponin 01/12/2018  . Acute on chronic combined systolic and diastolic CHF (congestive heart failure) ([place]) 12/14/2017  . Acute respiratory failure with hypoxia ([place]) 12/13/2017  . Acute on chronic systolic CHF (congestive heart failure) ([place]) 12/13/2017  . Dilated cardiomyopathy ([place]) 12/13/2017  . Pacemaker 12/13/2017  . Hyponatremia 12/13/2017  . Primary osteoarthritis, right shoulder 01/08/2017  . Mobitz type 2 second degree  atrioventricular block 10/02/2015  . Bradycardia 08/29/2015  . Aneurysm of abdominal vessel ([place]) 03/21/2011    Past Surgical History:  Procedure Laterality Date  . BACK SURGERY    . CATARACT EXTRACTION W/ INTRAOCULAR LENS  IMPLANT, BILATERAL Bilateral   . EP IMPLANTABLE DEVICE N/A 10/02/2015   Procedure: Pacemaker Implant;  Surgeon: Evans Lance, MD;  Location: [place] [facility];  Service: Cardiovascular;  Laterality: N/A;  . EXCISIONAL HEMORRHOIDECTOMY    . HEMORROIDECTOMY    . INGUINAL HERNIA REPAIR Bilateral 1991  . INSERT / REPLACE / REMOVE PACEMAKER    . JOINT REPLACEMENT    . [place] SURGERY  2005  . TOTAL KNEE ARTHROPLASTY Bilateral 1998 -  2003  . TOTAL SHOULDER REPLACEMENT Left 1999   Partial  . TYMPANOSTOMY TUBE PLACEMENT Right         Home Medications    Prior to Admission medications   Medication Sig Start Date End Date Taking? Authorizing Provider  ALPRAZolam (XANAX) 0.25 MG tablet Take 0.125-0.25 mg by mouth daily as needed for anxiety.   Yes [provider]  feeding supplement, ENSURE ENLIVE, (ENSURE ENLIVE) LIQD Take 237 mLs by mouth 2 (two) times daily between meals. 01/15/18  Yes Barton Dubois, MD  ferrous sulfate 325 (65 FE) MG EC tablet Take 1 tablet by mouth daily. 12/04/17  Yes [provider]  furosemide (LASIX) 40 MG tablet Take 1 tablet (40 mg total) by mouth daily. 12/19/17  Yes Isaac Bliss, [place]  Y, MD  guaiFENesin (MUCINEX) 600 MG 12 hr tablet Take 1 tablet (600 mg total) by mouth 2 (two) times daily. 01/14/18  Yes Barton Dubois, MD  loratadine (CLARITIN) 10 MG tablet Take 10 mg by mouth daily.   Yes [provider]  metoprolol succinate (TOPROL-XL) 25 MG 24 hr tablet Take 12.5 mg by mouth daily.   Yes [provider]  potassium chloride SA (K-DUR,KLOR-CON) 20 MEQ tablet Take 2 tablets (40 mEq total) by mouth daily. 12/18/17  Yes Isaac Bliss, Rayford Halsted, MD  amoxicillin-clavulanate (AUGMENTIN) 875-125 MG  tablet Take 1 tablet by mouth every 12 (twelve) hours. Patient not taking: Reported on 01/23/2018 01/14/18   Barton Dubois, MD  benzonatate (TESSALON PERLES) 100 MG capsule Take 1 capsule (100 mg total) by mouth 3 (three) times daily as needed for cough. 01/14/18 01/14/19  Barton Dubois, MD    Family History Family History  Problem Relation Age of Onset  . Heart disease Father        NOT  before age 55  . Pneumonia Mother   . Cystic fibrosis Paternal Aunt   . Kidney disease Neg Hx     Social History Social History   Tobacco Use  . Smoking status: Former Smoker    Packs/day: 1.00    Years: 15.00    Pack years: 15.00    Types: Cigarettes    Last attempt to quit: 05/13/1950    Years since quitting: 67.7  . Smokeless tobacco: Never Used  Substance Use Topics  . Alcohol use: No    Alcohol/week: 0.0 standard drinks  . Drug use: No     Allergies   Percocet [oxycodone-acetaminophen]; Noroxin [norfloxacin]; and Celebrex [celecoxib]   Review of Systems Review of Systems  Constitutional: Negative for chills and fever.  Eyes: Negative for visual disturbance.  Respiratory: Positive for cough and shortness of breath.   Cardiovascular: Negative for chest pain and leg swelling.  Gastrointestinal: Negative for abdominal pain, diarrhea, nausea and vomiting.  Genitourinary: Negative for dysuria, flank pain and frequency.  Musculoskeletal: Negative for back pain, myalgias, neck pain and neck stiffness.  Skin: Negative for rash and wound.  Neurological: Negative for dizziness, weakness, light-headedness, numbness and headaches.  All other systems reviewed and are negative.    Physical Exam Updated Vital Signs BP (!) 182/167 (BP Location: Left Arm)   Pulse (!) 128   Temp 98 F (36.7 C) (Oral)   Resp (!) 28   Ht 5\' 7"  (1.702 m)   Wt 62.3 kg   SpO2 94%   BMI 21.51 kg/m   Physical Exam  Constitutional: He is oriented to person, place, and time. He appears well-developed and  well-nourished. No distress.  HENT:  Head: Normocephalic and atraumatic.  Mouth/Throat: Oropharynx is clear and moist. No oropharyngeal exudate.  Eyes: Pupils are equal, round, and reactive to light. Conjunctivae and EOM are normal.  Neck: Normal range of motion. Neck supple. No JVD present. No tracheal deviation present. No thyromegaly present.  Cardiovascular: Normal rate.  Irregular  Pulmonary/Chest: Effort normal and breath sounds normal.  Mild tachypnea.  Decreased breath sounds in bilateral bases.  Abdominal: Soft. Bowel sounds are normal. There is no tenderness. There is no rebound and no guarding.  Musculoskeletal: Normal range of motion. He exhibits edema. He exhibits no tenderness.  1+ bilateral lower extremity pitting edema.  Lymphadenopathy:    He has no cervical adenopathy.  Neurological: He is alert and oriented to person, place, and time.  Moves  all extremities without deficit.  Sensation fully intact.  Skin: Skin is warm and dry. Capillary refill takes less than 2 seconds. No rash noted. He is not diaphoretic. No erythema.  Psychiatric: He has a normal mood and affect. His behavior is normal.  Nursing note and vitals reviewed.    ED Treatments / Results  Labs (all labs ordered are listed, but only abnormal results are displayed) Labs Reviewed  BRAIN NATRIURETIC PEPTIDE - Abnormal; Notable for the following components:      Result Value   B Natriuretic Peptide 2,118.0 (*)    All other components within normal limits  CBC WITH DIFFERENTIAL/PLATELET - Abnormal; Notable for the following components:   RBC 3.88 (*)    Hemoglobin 10.6 (*)    HCT 33.1 (*)    RDW 18.3 (*)    Monocytes Absolute 1.2 (*)    All other components within normal limits  COMPREHENSIVE METABOLIC PANEL - Abnormal; Notable for the following components:   Glucose, Bld 109 (*)    Calcium 8.5 (*)    Albumin 3.1 (*)    All other components within normal limits  TROPONIN I - Abnormal; Notable for  the following components:   Troponin I 0.03 (*)    All other components within normal limits  URINALYSIS, ROUTINE W REFLEX MICROSCOPIC - Abnormal; Notable for the following components:   Color, Urine STRAW (*)    Leukocytes, UA TRACE (*)    All other components within normal limits  TROPONIN I - Abnormal; Notable for the following components:   Troponin I 0.03 (*)    All other components within normal limits  BASIC METABOLIC PANEL - Abnormal; Notable for the following components:   Calcium 8.4 (*)    All other components within normal limits  CBC - Abnormal; Notable for the following components:   RBC 3.69 (*)    Hemoglobin 9.8 (*)    HCT 31.2 (*)    RDW 18.4 (*)    All other components within normal limits  MRSA PCR SCREENING  MAGNESIUM  BASIC METABOLIC PANEL  MAGNESIUM    EKG EKG Interpretation  Date/Time:  Friday January 23 2018 15:15:54 EDT Ventricular Rate:  89 PR Interval:    QRS Duration: 162 QT Interval:  443 QTC Calculation: 540 R Axis:   -125 Text Interpretation:  A-V dual-paced rhythm with some inhibition No further analysis attempted due to paced rhythm Confirmed by Julianne Rice [phone]) on 01/23/2018 3:27:41 PM   Radiology Dg Chest 2 View  Result Date: 01/23/2018 CLINICAL DATA:  History of pneumonia and continued shortness of breath. EXAM: CHEST - 2 VIEW COMPARISON:  01/12/2018 FINDINGS: Stable cardiomegaly. Stable appearance of the left dual chamber cardiac pacemaker. Severe degenerative changes at the right glenohumeral joint and previous left shoulder replacement. There is concern for increased densities at the right lung base with right pleural fluid. Patchy densities in both lungs could represent mild edema. Atherosclerotic calcifications at the aortic arch. IMPRESSION: Slightly increased densities at the right lung base. Findings could represent consolidation/volume loss with right pleural fluid. Infectious etiology cannot be excluded. Question mild  edema. Electronically Signed   By: Markus Daft M.D.   On: 01/23/2018 16:49   Ct Chest W Contrast  Result Date: 01/23/2018 CLINICAL DATA:  Chest pain, short of breath EXAM: CT CHEST WITH CONTRAST TECHNIQUE: Multidetector CT imaging of the chest was performed during intravenous contrast administration. CONTRAST:  74mL ISOVUE-300 IOPAMIDOL (ISOVUE-300) INJECTION 61% COMPARISON:  01/23/2018 radiograph FINDINGS: Cardiovascular: Nonaneurysmal  aorta. Moderate aortic atherosclerosis. Coronary vascular calcification. Mild cardiomegaly. No large pericardial effusion. Partially visualized cardiac pacing leads. Mediastinum/Nodes: Midline trachea. No thyroid mass. Right low paratracheal lymph node measures 13 mm. Esophagus within normal limits. Lungs/Pleura: Mild emphysema. Moderate right pleural effusion. Partial consolidation in the right lower lobe. No pneumothorax. Upper Abdomen: Calcified gallstones.  No acute abnormality Musculoskeletal: No acute or suspicious abnormality. Degenerative changes. IMPRESSION: 1. Moderate right pleural effusion with partial atelectasis or pneumonia at the right base. 2. Cardiomegaly 3. Gallstones 4. Mild emphysema Aortic Atherosclerosis (ICD10-I70.0) and Emphysema (ICD10-J43.9). Electronically Signed   By: Donavan Foil M.D.   On: 01/23/2018 17:46    Procedures Procedures (including critical care time)  Medications Ordered in ED Medications  ALPRAZolam (XANAX) tablet 0.125-0.25 mg (0.25 mg Oral Given 01/24/18 2203)  ferrous sulfate tablet 325 mg (325 mg Oral Given 01/24/18 0900)  feeding supplement (ENSURE ENLIVE) (ENSURE ENLIVE) liquid 237 mL (237 mLs Oral Given 01/24/18 1415)  potassium chloride SA (K-DUR,KLOR-CON) CR tablet 40 mEq (40 mEq Oral Given 01/24/18 0900)  benzonatate (TESSALON) capsule 100 mg (has no administration in time range)  guaiFENesin (MUCINEX) 12 hr tablet 600 mg (600 mg Oral Given 01/24/18 2200)  loratadine (CLARITIN) tablet 10 mg (10 mg Oral Given 01/24/18  0900)  enoxaparin (LOVENOX) injection 40 mg (40 mg Subcutaneous Given 01/24/18 2201)  sodium chloride flush (NS) 0.9 % injection 3 mL (3 mLs Intravenous Not Given 01/24/18 2200)  sodium chloride flush (NS) 0.9 % injection 3 mL (has no administration in time range)  0.9 %  sodium chloride infusion (has no administration in time range)  acetaminophen (TYLENOL) tablet 650 mg (650 mg Oral Given 01/23/18 2325)    Or  acetaminophen (TYLENOL) suppository 650 mg ( Rectal See Alternative 01/23/18 2325)  ondansetron (ZOFRAN) tablet 4 mg (has no administration in time range)    Or  ondansetron (ZOFRAN) injection 4 mg (has no administration in time range)  furosemide (LASIX) 250 mg in dextrose 5 % 250 mL (1 mg/mL) infusion (4 mg/hr Intravenous Transfusing/Transfer 01/24/18 1644)  albuterol (PROVENTIL) (2.5 MG/3ML) 0.083% nebulizer solution 5 mg (5 mg Nebulization Given 01/23/18 1543)  iopamidol (ISOVUE-300) 61 % injection 100 mL (75 mLs Intravenous Contrast Given 01/23/18 1730)     Initial Impression / Assessment and Plan / ED Course  I have reviewed the triage vital signs and the nursing notes.  Pertinent labs & imaging results that were available during my care of the patient were reviewed by me and considered in my medical decision making (see chart for details).     Normal white blood cell count.  CT chest with evidence of right-sided pleural effusion.  No definite pneumonia.  Patient is comfortable with resting.  Borderline hypotensive blood pressure but this appears to be his baseline.  Discussed with hospitalist will see patient in the emergency department.  Final Clinical Impressions(s) / ED Diagnoses   Final diagnoses:  Pleural effusion on right    ED Discharge Orders    None       Julianne Rice, MD 01/24/18 2210

## 2018-01-23 NOTE — ED Notes (Signed)
Patient transported to X-ray 

## 2018-01-23 NOTE — H&P (Addendum)
History and Physical    Shawn Frye PPI:[id number] DOB: 05/10/1919 DOA: 01/23/2018  PCP: [name], MD   Patient coming from: Home  Chief Complaint: Dyspnea  HPI: Shawn Frye is a 84 y.o. male with medical history significant for cardiomyopathy with systolic congestive heart failure and LVEF 20%, cardiac pacemaker with history of second-degree AV block, AAA, iron deficiency anemia, and recent discharge on 9/4 after treatment for sepsis with pneumonia to the right lower lobe, who presented to the ED with complaints of worsening shortness of breath over the last 2 days.  He has had worsening symptoms with laying flat and with some exertion.  He is also noted a mild dry cough that is nonproductive.  He denies any fevers or chills.  He has had some mild lower extremity edema as well.  He has continued to take his Lasix as prescribed and has finished his course of oral antibiotics a few days ago.   ED Course: Vital signs are stable with some soft blood pressure readings noted.  He currently has a paced telemetry rhythm noted.  He was started on 2 L nasal cannula but was otherwise saturating just fine without it.  Initial chest x-ray with no significant findings, but CT chest with contrast demonstrates some mild cardiomegaly along with moderate right pleural effusion.  He has been given some breathing treatments in the ED and does not appear to be in any respiratory distress.  Review of Systems: All others reviewed and otherwise negative.  Past Medical History:  Diagnosis Date  . AAA (abdominal aortic aneurysm) ([place])   . Arthritis   . Chronic combined systolic (congestive) and diastolic (congestive) heart failure ([place])    a. 10/2017: echo showing a reduced EF of 25%, diffuse HK with akinesis of the apical myocardium, Grade 2 DD, mild AI, mild to moderate MR, and mild to moderate TR  . GERD (gastroesophageal reflux disease)   . Heart block AV second degree    St. Jude pacemaker May 2017 - Dr.  [name]  . Infection of bladder   . Joint pain   . Symptomatic bradycardia     Past Surgical History:  Procedure Laterality Date  . BACK SURGERY    . CATARACT EXTRACTION W/ INTRAOCULAR LENS  IMPLANT, BILATERAL Bilateral   . EP IMPLANTABLE DEVICE N/A 10/02/2015   Procedure: Pacemaker Implant;  Surgeon: Evans Lance, MD;  Location: [facility];  Service: Cardiovascular;  Laterality: N/A;  . EXCISIONAL HEMORRHOIDECTOMY    . HEMORROIDECTOMY    . INGUINAL HERNIA REPAIR Bilateral 1991  . INSERT / REPLACE / REMOVE PACEMAKER    . JOINT REPLACEMENT    . [place] SURGERY  2005  . TOTAL KNEE ARTHROPLASTY Bilateral 1998 -  2003  . TOTAL SHOULDER REPLACEMENT Left 1999   Partial  . TYMPANOSTOMY TUBE PLACEMENT Right      reports that he quit smoking about 67 years ago. His smoking use included cigarettes. He has a 15.00 pack-year smoking history. He has never used smokeless tobacco. He reports that he does not drink alcohol or use drugs.  Allergies  Allergen Reactions  . Percocet [Oxycodone-Acetaminophen] Hives  . Noroxin [Norfloxacin] Nausea And Vomiting  . Celebrex [Celecoxib] Rash    Family History  Problem Relation Age of Onset  . Heart disease Father        NOT  before age 92  . Pneumonia Mother   . Cystic fibrosis Paternal Aunt   . Kidney disease  Neg Hx     Prior to Admission medications   Medication Sig Start Date End Date Taking? Authorizing Provider  ALPRAZolam (XANAX) 0.25 MG tablet Take 0.125-0.25 mg by mouth daily as needed for anxiety.   Yes [provider]  feeding supplement, ENSURE ENLIVE, (ENSURE ENLIVE) LIQD Take 237 mLs by mouth 2 (two) times daily between meals. 01/15/18  Yes Barton Dubois, MD  ferrous sulfate 325 (65 FE) MG EC tablet Take 1 tablet by mouth daily. 12/04/17  Yes [provider]  furosemide (LASIX) 40 MG tablet Take 1 tablet (40 mg total) by mouth daily. 12/19/17  Yes Isaac Bliss, Rayford Halsted, MD  guaiFENesin (MUCINEX) 600  MG 12 hr tablet Take 1 tablet (600 mg total) by mouth 2 (two) times daily. 01/14/18  Yes Barton Dubois, MD  loratadine (CLARITIN) 10 MG tablet Take 10 mg by mouth daily.   Yes [provider]  metoprolol succinate (TOPROL-XL) 25 MG 24 hr tablet Take 12.5 mg by mouth daily.   Yes [provider]  potassium chloride SA (K-DUR,KLOR-CON) 20 MEQ tablet Take 2 tablets (40 mEq total) by mouth daily. 12/18/17  Yes Isaac Bliss, Rayford Halsted, MD  amoxicillin-clavulanate (AUGMENTIN) 875-125 MG tablet Take 1 tablet by mouth every 12 (twelve) hours. Patient not taking: Reported on 01/23/2018 01/14/18   Barton Dubois, MD  benzonatate (TESSALON PERLES) 100 MG capsule Take 1 capsule (100 mg total) by mouth 3 (three) times daily as needed for cough. 01/14/18 01/14/19  Barton Dubois, MD    Physical Exam: Vitals:   01/23/18 1800 01/23/18 1900 01/23/18 1930 01/23/18 1949  BP: 94/63 98/62 95/66  108/67  Pulse:    72  Resp: 16 (!) 22 (!) 26 16  Temp:    97.9 F (36.6 C)  TempSrc:    Oral  SpO2:    97%  Weight:      Height:        Constitutional: NAD, calm, comfortable, hard of hearing Vitals:   01/23/18 1800 01/23/18 1900 01/23/18 1930 01/23/18 1949  BP: 94/63 98/62 95/66  108/67  Pulse:    72  Resp: 16 (!) 22 (!) 26 16  Temp:    97.9 F (36.6 C)  TempSrc:    Oral  SpO2:    97%  Weight:      Height:       Eyes: lids and conjunctivae normal ENMT: Mucous membranes are moist.  Neck: normal, supple Respiratory: clear to auscultation bilaterally. Normal respiratory effort. No accessory muscle use.  Currently on 1 L nasal cannula. Cardiovascular: Regular rate and rhythm, no murmurs.  Scant, 1+ pitting edema up to mid shin bilaterally. Abdomen: no tenderness, no distention. Bowel sounds positive.  Musculoskeletal:  No joint deformity upper and lower extremities.   Skin: no rashes, lesions, ulcers.  Psychiatric: Normal judgment and insight. Alert and oriented x 3. Normal mood.   Labs on  Admission: I have personally reviewed following labs and imaging studies  CBC: Recent Labs  Lab 01/23/18 1540  WBC 9.0  NEUTROABS 6.4  HGB 10.6*  HCT 33.1*  MCV 85.3  PLT 024   Basic Metabolic Panel: Recent Labs  Lab 01/23/18 1540  NA 136  K 3.9  CL 104  CO2 25  GLUCOSE 109*  BUN 19  CREATININE 0.86  CALCIUM 8.5*   GFR: Estimated Creatinine Clearance: 46.7 mL/min (by C-G formula based on SCr of 0.86 mg/dL). Liver Function Tests: Recent Labs  Lab 01/23/18 1540  AST 20  ALT 11  ALKPHOS 94  BILITOT 0.9  PROT 7.8  ALBUMIN 3.1*   No results for input(s): LIPASE, AMYLASE in the last 168 hours. No results for input(s): AMMONIA in the last 168 hours. Coagulation Profile: No results for input(s): INR, PROTIME in the last 168 hours. Cardiac Enzymes: Recent Labs  Lab 01/23/18 1540 01/23/18 1858  TROPONINI 0.03* 0.03*   BNP (last 3 results) No results for input(s): PROBNP in the last 8760 hours. HbA1C: No results for input(s): HGBA1C in the last 72 hours. CBG: No results for input(s): GLUCAP in the last 168 hours. Lipid Profile: No results for input(s): CHOL, HDL, LDLCALC, TRIG, CHOLHDL, LDLDIRECT in the last 72 hours. Thyroid Function Tests: No results for input(s): TSH, T4TOTAL, FREET4, T3FREE, THYROIDAB in the last 72 hours. Anemia Panel: No results for input(s): VITAMINB12, FOLATE, FERRITIN, TIBC, IRON, RETICCTPCT in the last 72 hours. Urine analysis:    Component Value Date/Time   COLORURINE STRAW (A) 01/23/2018 1732   APPEARANCEUR CLEAR 01/23/2018 1732   LABSPEC 1.008 01/23/2018 1732   PHURINE 6.0 01/23/2018 1732   GLUCOSEU NEGATIVE 01/23/2018 1732   HGBUR NEGATIVE 01/23/2018 1732   BILIRUBINUR NEGATIVE 01/23/2018 1732   KETONESUR NEGATIVE 01/23/2018 1732   PROTEINUR NEGATIVE 01/23/2018 1732   NITRITE NEGATIVE 01/23/2018 1732   LEUKOCYTESUR TRACE (A) 01/23/2018 1732    Radiological Exams on Admission: Dg Chest 2 View  Result Date:  01/23/2018 CLINICAL DATA:  History of pneumonia and continued shortness of breath. EXAM: CHEST - 2 VIEW COMPARISON:  01/12/2018 FINDINGS: Stable cardiomegaly. Stable appearance of the left dual chamber cardiac pacemaker. Severe degenerative changes at the right glenohumeral joint and previous left shoulder replacement. There is concern for increased densities at the right lung base with right pleural fluid. Patchy densities in both lungs could represent mild edema. Atherosclerotic calcifications at the aortic arch. IMPRESSION: Slightly increased densities at the right lung base. Findings could represent consolidation/volume loss with right pleural fluid. Infectious etiology cannot be excluded. Question mild edema. Electronically Signed   By: Markus Daft M.D.   On: 01/23/2018 16:49   Ct Chest W Contrast  Result Date: 01/23/2018 CLINICAL DATA:  Chest pain, short of breath EXAM: CT CHEST WITH CONTRAST TECHNIQUE: Multidetector CT imaging of the chest was performed during intravenous contrast administration. CONTRAST:  55mL ISOVUE-300 IOPAMIDOL (ISOVUE-300) INJECTION 61% COMPARISON:  01/23/2018 radiograph FINDINGS: Cardiovascular: Nonaneurysmal aorta. Moderate aortic atherosclerosis. Coronary vascular calcification. Mild cardiomegaly. No large pericardial effusion. Partially visualized cardiac pacing leads. Mediastinum/Nodes: Midline trachea. No thyroid mass. Right low paratracheal lymph node measures 13 mm. Esophagus within normal limits. Lungs/Pleura: Mild emphysema. Moderate right pleural effusion. Partial consolidation in the right lower lobe. No pneumothorax. Upper Abdomen: Calcified gallstones.  No acute abnormality Musculoskeletal: No acute or suspicious abnormality. Degenerative changes. IMPRESSION: 1. Moderate right pleural effusion with partial atelectasis or pneumonia at the right base. 2. Cardiomegaly 3. Gallstones 4. Mild emphysema Aortic Atherosclerosis (ICD10-I70.0) and Emphysema (ICD10-J43.9).  Electronically Signed   By: Donavan Foil M.D.   On: 01/23/2018 17:46    EKG: Independently reviewed. 89bpm dual-paced AV rhythm.  Assessment/Plan Principal Problem:   Pleural effusion Active Problems:   Aneurysm of abdominal vessel (HCC)   Mobitz type 2 second degree atrioventricular block   Dilated cardiomyopathy (HCC)   Pacemaker   Chronic systolic CHF (congestive heart failure) (HCC)   Malnutrition of moderate degree    1. Dyspnea secondary to moderate right-sided pleural effusion.  This is associated with some cardiomegaly and does not appear to  be of infectious etiology as there is no white count or fever noted.  There is no pleuritic chest pain or chest pain otherwise.  He seems to have some mild volume overload for which we will recommend initiating Lasix IV drip due to soft Bps along with low EF, with daily weights and strict I's and O's for monitoring.  Anticipate discharge in the next 24 to 48 hours once patient has responded to diuresis. 2. Mild chronic systolic CHF exacerbation in the setting of severe cardiomyopathy with EF 20%.  Patient is noted to have chronic BNP elevation and this likely explains the effusion.  Continue diuresis with negative fluid balance and monitor daily weights as noted above.  Avoid metoprolol due to soft blood pressure readings at this time. 3. Iron deficiency anemia.  Continue ferrous sulfate.  Monitor CBC with no overt bleeding identified. 4. Second-degree AV block status post [facility] pacemaker in 09/2015.  Continue follow-up with electrophysiologist Dr. [name].  No need for telemetry monitoring at this time. 5. AAA.  Continue monitor with vascular surgery in the outpatient setting. 6. Moderate protein calorie malnutrition.  Continue with Ensure as per nutritional service recommendations during previous hospitalization.   DVT prophylaxis: Lovenox Code Status: DNR Family Communication: None at bedside Disposition Plan:IV diuresis;  observation Consults called:None Admission status: Obs, SDU   Pratik D [name] DO [facility] Pager [phone]  If 7PM-7AM, please contact night-coverage [email or web address] Password [facility]  01/23/2018, 8:23 PM

## 2018-01-23 NOTE — ED Notes (Signed)
Pt lives at home with caregivers Recent admission with pneumonia Finished antibiotics Home health noticed coarse LS- mobile Rad-  Finding of continued pneumonia per family and caregiver

## 2018-01-23 NOTE — ED Notes (Signed)
Friend of pt to N desk request pt be kept overnight as he has no caregiver at home  Dr Darreld Mclean informed and will speak with pt and friend

## 2018-01-23 NOTE — ED Notes (Signed)
Pt ambulated around ED per Dr. Lita Mains.   Pt initial o2 sat was 91% and rose to 99%. Pt hr started at 96 and rose to 102.   Pt did not c/o any pain, sob or c/p. Pt ambulated with walker.

## 2018-01-24 ENCOUNTER — Encounter (HOSPITAL_COMMUNITY): Payer: Self-pay

## 2018-01-24 DIAGNOSIS — E440 Moderate protein-calorie malnutrition: Secondary | ICD-10-CM

## 2018-01-24 DIAGNOSIS — R748 Abnormal levels of other serum enzymes: Secondary | ICD-10-CM

## 2018-01-24 DIAGNOSIS — I420 Dilated cardiomyopathy: Secondary | ICD-10-CM

## 2018-01-24 DIAGNOSIS — J90 Pleural effusion, not elsewhere classified: Secondary | ICD-10-CM

## 2018-01-24 DIAGNOSIS — I714 Abdominal aortic aneurysm, without rupture: Secondary | ICD-10-CM | POA: Diagnosis not present

## 2018-01-24 DIAGNOSIS — I5043 Acute on chronic combined systolic (congestive) and diastolic (congestive) heart failure: Secondary | ICD-10-CM | POA: Diagnosis not present

## 2018-01-24 DIAGNOSIS — J9601 Acute respiratory failure with hypoxia: Secondary | ICD-10-CM | POA: Diagnosis not present

## 2018-01-24 LAB — BASIC METABOLIC PANEL
Anion gap: 6 (ref 5–15)
BUN: 15 mg/dL (ref 8–23)
CO2: 24 mmol/L (ref 22–32)
CREATININE: 0.8 mg/dL (ref 0.61–1.24)
Calcium: 8.4 mg/dL — ABNORMAL LOW (ref 8.9–10.3)
Chloride: 109 mmol/L (ref 98–111)
GFR calc Af Amer: >60 mL/min (ref >60)
Glucose, Bld: 93 mg/dL (ref 70–99)
Potassium: 4.3 mmol/L (ref 3.5–5.1)
Sodium: 139 mmol/L (ref 135–145)

## 2018-01-24 LAB — CBC
HCT: 31.2 % — ABNORMAL LOW (ref 39.0–52.0)
Hemoglobin: 9.8 g/dL — ABNORMAL LOW (ref 13.0–17.0)
MCH: 26.6 pg (ref 26.0–34.0)
MCHC: 31.4 g/dL (ref 30.0–36.0)
MCV: 84.6 fL (ref 78.0–100.0)
PLATELETS: 197 10*3/uL (ref 150–400)
RBC: 3.69 MIL/uL — ABNORMAL LOW (ref 4.22–5.81)
RDW: 18.4 % — AB (ref 11.5–15.5)
WBC: 6.8 10*3/uL (ref 4.0–10.5)

## 2018-01-24 LAB — MAGNESIUM: MAGNESIUM: 2.3 mg/dL (ref 1.7–2.4)

## 2018-01-24 LAB — MRSA PCR SCREENING: MRSA BY PCR: NEGATIVE

## 2018-01-24 NOTE — Progress Notes (Addendum)
PROGRESS NOTE  Shawn Frye UXL:[id number] DOB: Sep 26, 1918 DOA: 01/23/2018 PCP: [name], MD  Brief History:  84 y.o.malewith past medical history of symptomatic bradycardia due to 2nd degree AVB (s/p St. Jude PPM placement in 2017), GERD, and AAA who presentswith one week hx of worsening sob and LE edema x 2 days.  The patient denies any fevers, chills, chest pain, nausea, vomiting, diarrhea, abdominal pain.  However, he complains of a nonproductive cough.  The patient has had 2 recent hospitalizations.  Most recently, the patient was recently admitted from 01/12/2018 through 01/14/2018 at which time he was treated for pneumonia.  He was discharged home with amox/clav.  He states that he has finished his antibiotics. He states, "I've been drinking plenty of fluid."  In addition, the patient was also admitted previously from 12/13/2017 through 12/18/2017 for acute respiratory failure secondary to acute on chronic systolic CHF.  He was discharged home with furosemide 40 mg daily.  Upon presentation, the patient was noted to have increased interstitial markings on chest x-ray.  BMP and CBC were essentially unremarkable.  CT of the chest showed coronary artery disease with mild emphysema.  There was a moderate right pleural effusion and partial RLL consolidation.  The patient was started on intravenous furosemide.  Assessment/Plan: Acute on chronic systolic and diastolic CHF -appears to have biventricular failure -accurate I/O -holding metoprolol succinate due to acute decompensation with low EF--concerned this may also be contributing to his dyspnea on exertion -12/14/17 Echo--EF 20%, diffuse HK, grade 2 DD, mod to severe TR -remains clinically fluid overloaded -continue IV lasix  Acute respiratory failure with hypoxia  -due to pulmonary edema -wean oxygen back to room air for saturation > 92% -personally reviewed CXR--increased interstitial markings, RLL   Second Degree AVB -s/p PPM  May 2017 -saw Dr. Cristopher Peru 12/29/17--PPM working normally  AAA - followed by Vascular Surgery. At 5.6 cm by imaging in 06/2017.  Moderate Malnutrition -continue Ensure  Elevated troponin -due to decompensated CHF -trend is flat -personally reviewed EKG--AV paced -no chest pain presently    Disposition Plan:   Home in 1-2 days  Family Communication:  No Family at bedside  Consultants:  none  Code Status:  FULL   DVT Prophylaxis:  Franquez Lovenox   Procedures: As Listed in Progress Note Above  Antibiotics: None    Subjective: Pt stated he is breathing better on IV lasix, but still has dyspnea on exertion and orthopnea.  Denies cp, n/v/d, abd pain, f/c, headache.  Objective: Vitals:   01/24/18 0400 01/24/18 0500 01/24/18 0600 01/24/18 0710  BP: 96/74 96/73 96/66    Pulse: 87 78 77 83  Resp: (!) 27 (!) 23 (!) 28 19  Temp: 97.7 F (36.5 C)   (!) 97.5 F (36.4 C)  TempSrc: Oral   Axillary  SpO2: 91% 91% (!) 87% 96%  Weight: 62.3 kg     Height:        Intake/Output Summary (Last 24 hours) at 01/24/2018 0858 Last data filed at 01/24/2018 0514 Gross per 24 hour  Intake 25.4 ml  Output 100 ml  Net -74.6 ml   Weight change:  Exam:   General:  Pt is alert, follows commands appropriately, not in acute distress  HEENT: No icterus, No thrush, No neck mass, [place]/AT  Cardiovascular: RRR, S1/S2, no rubs, no gallops  Respiratory: bibasilar crackles, no wheeze  Abdomen: Soft/+BS, non tender, non distended, no guarding  Extremities: 1 + LE edema, No lymphangitis, No petechiae, No rashes, no synovitis   Data Reviewed: I have personally reviewed following labs and imaging studies Basic Metabolic Panel: Recent Labs  Lab 01/23/18 1540 01/24/18 0424  NA 136 139  K 3.9 4.3  CL 104 109  CO2 25 24  GLUCOSE 109* 93  BUN 19 15  CREATININE 0.86 0.80  CALCIUM 8.5* 8.4*  MG  --  2.3   Liver Function Tests: Recent Labs  Lab 01/23/18 1540  AST 20  ALT 11    ALKPHOS 94  BILITOT 0.9  PROT 7.8  ALBUMIN 3.1*   No results for input(s): LIPASE, AMYLASE in the last 168 hours. No results for input(s): AMMONIA in the last 168 hours. Coagulation Profile: No results for input(s): INR, PROTIME in the last 168 hours. CBC: Recent Labs  Lab 01/23/18 1540 01/24/18 0424  WBC 9.0 6.8  NEUTROABS 6.4  --   HGB 10.6* 9.8*  HCT 33.1* 31.2*  MCV 85.3 84.6  PLT 222 197   Cardiac Enzymes: Recent Labs  Lab 01/23/18 1540 01/23/18 1858  TROPONINI 0.03* 0.03*   BNP: Invalid input(s): POCBNP CBG: No results for input(s): GLUCAP in the last 168 hours. HbA1C: No results for input(s): HGBA1C in the last 72 hours. Urine analysis:    Component Value Date/Time   COLORURINE STRAW (A) 01/23/2018 1732   APPEARANCEUR CLEAR 01/23/2018 1732   LABSPEC 1.008 01/23/2018 1732   PHURINE 6.0 01/23/2018 1732   GLUCOSEU NEGATIVE 01/23/2018 1732   HGBUR NEGATIVE 01/23/2018 1732   BILIRUBINUR NEGATIVE 01/23/2018 1732   KETONESUR NEGATIVE 01/23/2018 1732   PROTEINUR NEGATIVE 01/23/2018 1732   NITRITE NEGATIVE 01/23/2018 1732   LEUKOCYTESUR TRACE (A) 01/23/2018 1732   Sepsis Labs: @LABRCNTIP (procalcitonin:4,lacticidven:4) ) Recent Results (from the past 240 hour(s))  MRSA PCR Screening     Status: None   Collection Time: 01/23/18  9:50 PM  Result Value Ref Range Status   MRSA by PCR NEGATIVE NEGATIVE Final    Comment:        The GeneXpert MRSA Assay (FDA approved for NASAL specimens only), is one component of a comprehensive MRSA colonization surveillance program. It is not intended to diagnose MRSA infection nor to guide or monitor treatment for MRSA infections. Performed at [facility], [address] [place], [place] [address]      Scheduled Meds: . enoxaparin (LOVENOX) injection  40 mg Subcutaneous Q24H  . feeding supplement (ENSURE ENLIVE)  237 mL Oral BID BM  . ferrous sulfate  325 mg Oral Daily  . guaiFENesin  600 mg Oral BID  .  loratadine  10 mg Oral Daily  . potassium chloride SA  40 mEq Oral Daily  . sodium chloride flush  3 mL Intravenous Q12H   Continuous Infusions: . sodium chloride    . furosemide (LASIX) infusion 4 mg/hr (01/23/18 2337)    Procedures/Studies: Dg Chest 2 View  Result Date: 01/23/2018 CLINICAL DATA:  History of pneumonia and continued shortness of breath. EXAM: CHEST - 2 VIEW COMPARISON:  01/12/2018 FINDINGS: Stable cardiomegaly. Stable appearance of the left dual chamber cardiac pacemaker. Severe degenerative changes at the right glenohumeral joint and previous left shoulder replacement. There is concern for increased densities at the right lung base with right pleural fluid. Patchy densities in both lungs could represent mild edema. Atherosclerotic calcifications at the aortic arch. IMPRESSION: Slightly increased densities at the right lung base. Findings could represent consolidation/volume loss with right pleural fluid. Infectious etiology cannot  be excluded. Question mild edema. Electronically Signed   By: Markus Daft M.D.   On: 01/23/2018 16:49   Dg Chest 2 View  Result Date: 01/12/2018 CLINICAL DATA:  Patient with worsening shortness of breath. EXAM: CHEST - 2 VIEW COMPARISON:  Chest radiograph 12/13/2017 FINDINGS: Multi lead pacer apparatus overlies the left hemithorax, leads are stable in position. Stable cardiomegaly. Tortuosity and calcification of the thoracic aorta. Coarse bilateral interstitial opacities. Heterogeneous opacities right lung base. Small bilateral pleural effusions. Thoracic spine degenerative changes. IMPRESSION: Cardiomegaly. Coarse bilateral interstitial opacities may represent mild edema. Heterogeneous opacities right lung base may represent atelectasis or infection. Electronically Signed   By: Lovey Newcomer M.D.   On: 01/12/2018 17:02   Ct Chest W Contrast  Result Date: 01/23/2018 CLINICAL DATA:  Chest pain, short of breath EXAM: CT CHEST WITH CONTRAST TECHNIQUE:  Multidetector CT imaging of the chest was performed during intravenous contrast administration. CONTRAST:  74mL ISOVUE-300 IOPAMIDOL (ISOVUE-300) INJECTION 61% COMPARISON:  01/23/2018 radiograph FINDINGS: Cardiovascular: Nonaneurysmal aorta. Moderate aortic atherosclerosis. Coronary vascular calcification. Mild cardiomegaly. No large pericardial effusion. Partially visualized cardiac pacing leads. Mediastinum/Nodes: Midline trachea. No thyroid mass. Right low paratracheal lymph node measures 13 mm. Esophagus within normal limits. Lungs/Pleura: Mild emphysema. Moderate right pleural effusion. Partial consolidation in the right lower lobe. No pneumothorax. Upper Abdomen: Calcified gallstones.  No acute abnormality Musculoskeletal: No acute or suspicious abnormality. Degenerative changes. IMPRESSION: 1. Moderate right pleural effusion with partial atelectasis or pneumonia at the right base. 2. Cardiomegaly 3. Gallstones 4. Mild emphysema Aortic Atherosclerosis (ICD10-I70.0) and Emphysema (ICD10-J43.9). Electronically Signed   By: Donavan Foil M.D.   On: 01/23/2018 17:46    Orson Eva, DO  [facility] Pager [phone]  If 7PM-7AM, please contact night-coverage [email or web address] Password [email or web address] 01/24/2018, 8:58 AM   LOS: 0 days

## 2018-01-25 DIAGNOSIS — R748 Abnormal levels of other serum enzymes: Secondary | ICD-10-CM | POA: Diagnosis not present

## 2018-01-25 DIAGNOSIS — E440 Moderate protein-calorie malnutrition: Secondary | ICD-10-CM | POA: Diagnosis not present

## 2018-01-25 DIAGNOSIS — J9601 Acute respiratory failure with hypoxia: Secondary | ICD-10-CM | POA: Diagnosis not present

## 2018-01-25 DIAGNOSIS — J90 Pleural effusion, not elsewhere classified: Secondary | ICD-10-CM | POA: Diagnosis not present

## 2018-01-25 DIAGNOSIS — I5043 Acute on chronic combined systolic (congestive) and diastolic (congestive) heart failure: Secondary | ICD-10-CM | POA: Diagnosis not present

## 2018-01-25 LAB — BASIC METABOLIC PANEL
Anion gap: 6 (ref 5–15)
BUN: 17 mg/dL (ref 8–23)
CO2: 29 mmol/L (ref 22–32)
CREATININE: 0.83 mg/dL (ref 0.61–1.24)
Calcium: 8.3 mg/dL — ABNORMAL LOW (ref 8.9–10.3)
Chloride: 103 mmol/L (ref 98–111)
GFR calc non Af Amer: >60 mL/min (ref >60)
Glucose, Bld: 99 mg/dL (ref 70–99)
Potassium: 3.1 mmol/L — ABNORMAL LOW (ref 3.5–5.1)
Sodium: 138 mmol/L (ref 135–145)

## 2018-01-25 LAB — MAGNESIUM: Magnesium: 2 mg/dL (ref 1.7–2.4)

## 2018-01-25 MED ORDER — FUROSEMIDE 10 MG/ML IJ SOLN
INTRAMUSCULAR | Status: AC
  Filled 2018-01-25: qty 30

## 2018-01-25 MED ORDER — POTASSIUM CHLORIDE CRYS ER 20 MEQ PO TBCR
20.0000 meq | EXTENDED_RELEASE_TABLET | Freq: Once | ORAL | Status: AC
  Administered 2018-01-25: 20 meq via ORAL
  Filled 2018-01-25: qty 1

## 2018-01-25 NOTE — Progress Notes (Signed)
So far this shift pt is non-compliant with fall precautions.  He is refusing the condom catheter and urinal.  He will not call for assistance when he needs to get up and states his doctor told him he can do whatever he needs to.  At this time I am removing the bedside fall mat due to tripping hazard.  He does have a front wheeled walker in the room and is accustomed to using it.  Currently pt is up to chair with chair alarm activated, the call bell within reach and instructions to call for assistance.  Pt is A&O x4.

## 2018-01-25 NOTE — Progress Notes (Signed)
Pt back to bed w/fall mat and bed alarm in place.  Denies need to void at this time.

## 2018-01-25 NOTE — Progress Notes (Signed)
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  The patient denies any fevers, chills, chest pain, nausea, vomiting, diarrhea, abdominal pain.  However, he complains of a nonproductive cough.  The patient has had 2 recent hospitalizations.  Most recently, the patient was recently admitted from 01/12/2018 through 01/14/2018 at which time he was treated for pneumonia.  He was discharged home with amox/clav.  He states that he has finished his antibiotics. He states, "I've been drinking plenty of fluid."  In addition, the patient was also admitted previously from 12/13/2017 through 12/18/2017 for acute respiratory failure secondary to acute on chronic systolic CHF.  He was discharged home with furosemide 40 mg daily.  Upon presentation, the patient was noted to have increased interstitial markings on chest x-ray.  BMP and CBC were essentially unremarkable.  CT of the chest showed coronary artery disease with mild emphysema.  There was a moderate right pleural effusion and partial RLL consolidation.  The patient was started on intravenous furosemide.  Assessment/Plan: Acute on chronic systolic and diastolic CHF -appears to have biventricular failure -accurate I/O--incomplete, in part to poor pt compliance -holding metoprolol succinate due to acute decompensation with low EF--concerned this may also be contributing to his dyspnea on exertion -12/14/17 Echo--EF 20%, diffuse HK, grade 2 DD, mod to severe TR -remains clinically fluid overloaded -continue IV lasix  Acute respiratory failure with hypoxia  -due to pulmonary edema -weaned oxygen back to room air for saturation > 92% -personally reviewed CXR--increased interstitial  markings, RLL   Second Degree AVB -s/p PPM May 2017 -saw Dr. Cristopher Peru 12/29/17--PPM working normally  AAA - followed by Vascular Surgery. At 5.6 cm by imaging in 06/2017.  Moderate Malnutrition -continue Ensure  Elevated troponin -due to decompensated CHF -trend is flat -personally reviewed EKG--AV paced -no chest pain presently  Hypokalemia -replete -check mag--2.0   Disposition Plan:   Home in 1-2 days  Family Communication:  daughter in law ([place]) updated at bedside 9/15  Consultants:  none  Code Status:  FULL   DVT Prophylaxis:  [place] Lovenox   Procedures: As Listed in Progress Note Above  Antibiotics: None    Subjective: Pt is breathing better but still has some dyspnea with mild exertion.  Denies f/c, cp/sob, n/v/d, abd pain.  No dysuria  Objective: Vitals:   01/24/18 2100 01/24/18 2200 01/25/18 0642 01/25/18 1343  BP: (!) 182/167 96/62 101/61 (!) 85/64  Pulse: (!) 128 91 90 98  Resp: (!) 28 20 (!) 26 20  Temp: 98 F (36.7 C)  97.6 F (36.4 C) (!) 97.5 F (36.4 C)  TempSrc: Oral  Oral   SpO2: 94% 95% 93% 95%  Weight:      Height:        Intake/Output Summary (Last 24 hours) at 01/25/2018 1636 Last data filed at 01/25/2018 0800 Gross per 24 hour  Intake 288 ml  Output 3200 ml  Net -2912 ml   Weight change:  Exam:   General:  Pt is alert, follows commands appropriately, not in acute distress  HEENT: No icterus, No thrush, No neck mass, [place]/AT  Cardiovascular: RRR, S1/S2, no rubs, no gallops  Respiratory:  bibasilar crackles, no wheeze  Abdomen: Soft/+BS, non tender, non distended, no guarding  Extremities: 1+ LE edema, No lymphangitis, No petechiae, No rashes, no synovitis   Data Reviewed: I have personally reviewed following labs and imaging studies Basic Metabolic Panel: Recent Labs  Lab 01/23/18 1540 01/24/18 0424 01/25/18 0615  NA 136 139 138  K 3.9 4.3 3.1*  CL 104 109 103  CO2 25 24 29   GLUCOSE 109* 93  99  BUN 19 15 17   CREATININE 0.86 0.80 0.83  CALCIUM 8.5* 8.4* 8.3*  MG  --  2.3 2.0   Liver Function Tests: Recent Labs  Lab 01/23/18 1540  AST 20  ALT 11  ALKPHOS 94  BILITOT 0.9  PROT 7.8  ALBUMIN 3.1*   No results for input(s): LIPASE, AMYLASE in the last 168 hours. No results for input(s): AMMONIA in the last 168 hours. Coagulation Profile: No results for input(s): INR, PROTIME in the last 168 hours. CBC: Recent Labs  Lab 01/23/18 1540 01/24/18 0424  WBC 9.0 6.8  NEUTROABS 6.4  --   HGB 10.6* 9.8*  HCT 33.1* 31.2*  MCV 85.3 84.6  PLT 222 197   Cardiac Enzymes: Recent Labs  Lab 01/23/18 1540 01/23/18 1858  TROPONINI 0.03* 0.03*   BNP: Invalid input(s): POCBNP CBG: No results for input(s): GLUCAP in the last 168 hours. HbA1C: No results for input(s): HGBA1C in the last 72 hours. Urine analysis:    Component Value Date/Time   COLORURINE STRAW (A) 01/23/2018 1732   APPEARANCEUR CLEAR 01/23/2018 1732   LABSPEC 1.008 01/23/2018 1732   PHURINE 6.0 01/23/2018 1732   GLUCOSEU NEGATIVE 01/23/2018 1732   HGBUR NEGATIVE 01/23/2018 1732   BILIRUBINUR NEGATIVE 01/23/2018 1732   KETONESUR NEGATIVE 01/23/2018 1732   PROTEINUR NEGATIVE 01/23/2018 1732   NITRITE NEGATIVE 01/23/2018 1732   LEUKOCYTESUR TRACE (A) 01/23/2018 1732   Sepsis Labs: @LABRCNTIP (procalcitonin:4,lacticidven:4) ) Recent Results (from the past 240 hour(s))  MRSA PCR Screening     Status: None   Collection Time: 01/23/18  9:50 PM  Result Value Ref Range Status   MRSA by PCR NEGATIVE NEGATIVE Final    Comment:        The GeneXpert MRSA Assay (FDA approved for NASAL specimens only), is one component of a comprehensive MRSA colonization surveillance program. It is not intended to diagnose MRSA infection nor to guide or monitor treatment for MRSA infections. Performed at [facility], [address], [place], [place] [address]      Scheduled Meds: . enoxaparin (LOVENOX) injection   40 mg Subcutaneous Q24H  . feeding supplement (ENSURE ENLIVE)  237 mL Oral BID BM  . ferrous sulfate  325 mg Oral Daily  . guaiFENesin  600 mg Oral BID  . loratadine  10 mg Oral Daily  . potassium chloride  20 mEq Oral Once  . potassium chloride SA  40 mEq Oral Daily  . sodium chloride flush  3 mL Intravenous Q12H   Continuous Infusions: . sodium chloride    . furosemide (LASIX) infusion 4 mg/hr (01/25/18 0653)    Procedures/Studies: Dg Chest 2 View  Result Date: 01/23/2018 CLINICAL DATA:  History of pneumonia and continued shortness of breath. EXAM: CHEST - 2 VIEW COMPARISON:  01/12/2018 FINDINGS: Stable cardiomegaly. Stable appearance of the left dual chamber cardiac pacemaker. Severe degenerative changes at the right glenohumeral joint and previous left shoulder replacement. There is concern for increased densities at the right lung base with right pleural fluid. Patchy densities in both  lungs could represent mild edema. Atherosclerotic calcifications at the aortic arch. IMPRESSION: Slightly increased densities at the right lung base. Findings could represent consolidation/volume loss with right pleural fluid. Infectious etiology cannot be excluded. Question mild edema. Electronically Signed   By: Markus Daft M.D.   On: 01/23/2018 16:49   Dg Chest 2 View  Result Date: 01/12/2018 CLINICAL DATA:  Patient with worsening shortness of breath. EXAM: CHEST - 2 VIEW COMPARISON:  Chest radiograph 12/13/2017 FINDINGS: Multi lead pacer apparatus overlies the left hemithorax, leads are stable in position. Stable cardiomegaly. Tortuosity and calcification of the thoracic aorta. Coarse bilateral interstitial opacities. Heterogeneous opacities right lung base. Small bilateral pleural effusions. Thoracic spine degenerative changes. IMPRESSION: Cardiomegaly. Coarse bilateral interstitial opacities may represent mild edema. Heterogeneous opacities right lung base may represent atelectasis or infection.  Electronically Signed   By: Lovey Newcomer M.D.   On: 01/12/2018 17:02   Ct Chest W Contrast  Result Date: 01/23/2018 CLINICAL DATA:  Chest pain, short of breath EXAM: CT CHEST WITH CONTRAST TECHNIQUE: Multidetector CT imaging of the chest was performed during intravenous contrast administration. CONTRAST:  55mL ISOVUE-300 IOPAMIDOL (ISOVUE-300) INJECTION 61% COMPARISON:  01/23/2018 radiograph FINDINGS: Cardiovascular: Nonaneurysmal aorta. Moderate aortic atherosclerosis. Coronary vascular calcification. Mild cardiomegaly. No large pericardial effusion. Partially visualized cardiac pacing leads. Mediastinum/Nodes: Midline trachea. No thyroid mass. Right low paratracheal lymph node measures 13 mm. Esophagus within normal limits. Lungs/Pleura: Mild emphysema. Moderate right pleural effusion. Partial consolidation in the right lower lobe. No pneumothorax. Upper Abdomen: Calcified gallstones.  No acute abnormality Musculoskeletal: No acute or suspicious abnormality. Degenerative changes. IMPRESSION: 1. Moderate right pleural effusion with partial atelectasis or pneumonia at the right base. 2. Cardiomegaly 3. Gallstones 4. Mild emphysema Aortic Atherosclerosis (ICD10-I70.0) and Emphysema (ICD10-J43.9). Electronically Signed   By: Donavan Foil M.D.   On: 01/23/2018 17:46    Orson Eva, DO  [facility] Pager [phone]  If 7PM-7AM, please contact night-coverage [email or web address] Password [email or web address] 01/25/2018, 4:36 PM   LOS: 0 days

## 2018-01-26 DIAGNOSIS — Z961 Presence of intraocular lens: Secondary | ICD-10-CM | POA: Diagnosis present

## 2018-01-26 DIAGNOSIS — R1312 Dysphagia, oropharyngeal phase: Secondary | ICD-10-CM | POA: Diagnosis not present

## 2018-01-26 DIAGNOSIS — Z9841 Cataract extraction status, right eye: Secondary | ICD-10-CM | POA: Diagnosis not present

## 2018-01-26 DIAGNOSIS — Z881 Allergy status to other antibiotic agents status: Secondary | ICD-10-CM | POA: Diagnosis not present

## 2018-01-26 DIAGNOSIS — Z950 Presence of cardiac pacemaker: Secondary | ICD-10-CM | POA: Diagnosis not present

## 2018-01-26 DIAGNOSIS — I714 Abdominal aortic aneurysm, without rupture: Secondary | ICD-10-CM | POA: Diagnosis present

## 2018-01-26 DIAGNOSIS — J9601 Acute respiratory failure with hypoxia: Secondary | ICD-10-CM | POA: Diagnosis present

## 2018-01-26 DIAGNOSIS — Z96653 Presence of artificial knee joint, bilateral: Secondary | ICD-10-CM | POA: Diagnosis present

## 2018-01-26 DIAGNOSIS — K219 Gastro-esophageal reflux disease without esophagitis: Secondary | ICD-10-CM | POA: Diagnosis present

## 2018-01-26 DIAGNOSIS — Z885 Allergy status to narcotic agent status: Secondary | ICD-10-CM | POA: Diagnosis not present

## 2018-01-26 DIAGNOSIS — I441 Atrioventricular block, second degree: Secondary | ICD-10-CM | POA: Diagnosis not present

## 2018-01-26 DIAGNOSIS — K224 Dyskinesia of esophagus: Secondary | ICD-10-CM | POA: Diagnosis not present

## 2018-01-26 DIAGNOSIS — R1310 Dysphagia, unspecified: Secondary | ICD-10-CM | POA: Diagnosis not present

## 2018-01-26 DIAGNOSIS — Z888 Allergy status to other drugs, medicaments and biological substances status: Secondary | ICD-10-CM | POA: Diagnosis not present

## 2018-01-26 DIAGNOSIS — Z87891 Personal history of nicotine dependence: Secondary | ICD-10-CM | POA: Diagnosis not present

## 2018-01-26 DIAGNOSIS — Z96612 Presence of left artificial shoulder joint: Secondary | ICD-10-CM | POA: Diagnosis present

## 2018-01-26 DIAGNOSIS — R748 Abnormal levels of other serum enzymes: Secondary | ICD-10-CM | POA: Diagnosis not present

## 2018-01-26 DIAGNOSIS — J90 Pleural effusion, not elsewhere classified: Secondary | ICD-10-CM | POA: Diagnosis present

## 2018-01-26 DIAGNOSIS — K766 Portal hypertension: Secondary | ICD-10-CM | POA: Diagnosis present

## 2018-01-26 DIAGNOSIS — Z9842 Cataract extraction status, left eye: Secondary | ICD-10-CM | POA: Diagnosis not present

## 2018-01-26 DIAGNOSIS — Z23 Encounter for immunization: Secondary | ICD-10-CM | POA: Diagnosis not present

## 2018-01-26 DIAGNOSIS — Z8249 Family history of ischemic heart disease and other diseases of the circulatory system: Secondary | ICD-10-CM | POA: Diagnosis not present

## 2018-01-26 DIAGNOSIS — I420 Dilated cardiomyopathy: Secondary | ICD-10-CM | POA: Diagnosis present

## 2018-01-26 DIAGNOSIS — I5043 Acute on chronic combined systolic (congestive) and diastolic (congestive) heart failure: Secondary | ICD-10-CM | POA: Diagnosis present

## 2018-01-26 DIAGNOSIS — M1990 Unspecified osteoarthritis, unspecified site: Secondary | ICD-10-CM | POA: Diagnosis present

## 2018-01-26 DIAGNOSIS — F419 Anxiety disorder, unspecified: Secondary | ICD-10-CM | POA: Diagnosis present

## 2018-01-26 DIAGNOSIS — E440 Moderate protein-calorie malnutrition: Secondary | ICD-10-CM | POA: Diagnosis present

## 2018-01-26 DIAGNOSIS — R1314 Dysphagia, pharyngoesophageal phase: Secondary | ICD-10-CM | POA: Diagnosis not present

## 2018-01-26 DIAGNOSIS — Z8489 Family history of other specified conditions: Secondary | ICD-10-CM | POA: Diagnosis not present

## 2018-01-26 DIAGNOSIS — D509 Iron deficiency anemia, unspecified: Secondary | ICD-10-CM | POA: Diagnosis present

## 2018-01-26 DIAGNOSIS — K2950 Unspecified chronic gastritis without bleeding: Secondary | ICD-10-CM | POA: Diagnosis not present

## 2018-01-26 LAB — BASIC METABOLIC PANEL
Anion gap: 10 (ref 5–15)
BUN: 18 mg/dL (ref 8–23)
CHLORIDE: 101 mmol/L (ref 98–111)
CO2: 29 mmol/L (ref 22–32)
Calcium: 8.8 mg/dL — ABNORMAL LOW (ref 8.9–10.3)
Creatinine, Ser: 0.91 mg/dL (ref 0.61–1.24)
GFR calc Af Amer: >60 mL/min (ref >60)
GFR calc non Af Amer: >60 mL/min (ref >60)
GLUCOSE: 104 mg/dL — AB (ref 70–99)
POTASSIUM: 3.1 mmol/L — AB (ref 3.5–5.1)
Sodium: 140 mmol/L (ref 135–145)

## 2018-01-26 LAB — MAGNESIUM: Magnesium: 2 mg/dL (ref 1.7–2.4)

## 2018-01-26 MED ORDER — INFLUENZA VAC SPLIT HIGH-DOSE 0.5 ML IM SUSY
0.5000 mL | PREFILLED_SYRINGE | INTRAMUSCULAR | Status: AC
  Administered 2018-01-27: 0.5 mL via INTRAMUSCULAR
  Filled 2018-01-26: qty 0.5

## 2018-01-26 MED ORDER — FUROSEMIDE 40 MG PO TABS
40.0000 mg | ORAL_TABLET | Freq: Two times a day (BID) | ORAL | Status: DC
  Administered 2018-01-27 – 2018-01-29 (×5): 40 mg via ORAL
  Filled 2018-01-26 (×5): qty 1

## 2018-01-26 MED ORDER — PSYLLIUM 95 % PO PACK
1.0000 | PACK | Freq: Every day | ORAL | Status: DC
  Administered 2018-01-26 – 2018-01-29 (×4): 1 via ORAL
  Filled 2018-01-26 (×4): qty 1

## 2018-01-26 MED ORDER — FUROSEMIDE 40 MG PO TABS: 40.0000 mg | ORAL_TABLET | Freq: Two times a day (BID) | ORAL | Status: DC

## 2018-01-26 NOTE — Care Management Note (Addendum)
Case Management Note  Patient Details  Name: Shawn Frye MRN: [id number] Date of Birth: [birth year]    Admitted with CHF. Pt non-compliant with diet. Pt lives alone, wife is in ALF. Pt has aid and supportive family. Pt is on oxygen acutely. Pt active with [facility] pta, has had 3 admissions and 1 ED visit in past 6 months.  CM discussed [place] crew and pt is interested, he is aware that means he would have to switch [place] and he is okay with that. CM has made [facility] rep aware and given referral to [facility] rep. Pt alert and oriented, aid at bedside for DC planning discussion. Aid requests pt get flu shot prior to DC.   Referral made to [facility] at last hospitalization, status pending.                   Expected Discharge Date:  01/26/18               Expected Discharge Plan:  [place]  In-House Referral:  NA  Discharge planning Services  CM Consult  Post Acute Care Choice:  Home Health Choice offered to:  Patient  HH Arranged:  RN, PT, OT [place] Agency:  [facility] ([facility]  Status of Service:  Completed, signed off  Sherald Barge, RN 01/26/2018, 1:18 PM

## 2018-01-26 NOTE — Progress Notes (Signed)
PROGRESS NOTE  Shawn Frye BSJ:[id number] DOB: May 06, 1919 DOA: 01/23/2018 PCP: [name], MD  Brief History: 84 y.o.malewith past medical history of symptomatic bradycardiadue to 2nd degree AVB(s/p St. Jude PPM placement in 2017), GERD, and AAA who presentswith one week hx of worsening sob and LE edema x 2 days.The patient denies any fevers, chills, chest pain, nausea, vomiting, diarrhea, abdominal pain. However, he complains of a nonproductive cough. The patient has had 2 recent hospitalizations. Most recently, the patient was recently admitted from 01/12/2018 through 01/14/2018 at which time he was treated for pneumonia. He was discharged home with amox/clav.He states that he has finished his antibiotics. He states, "I've been drinking plenty of fluid."In addition, the patient was also admitted previously from 12/13/2017 through 12/18/2017 for acute respiratory failure secondary to acute on chronic systolic CHF. He was discharged home with furosemide 40 mg daily. Upon presentation, the patient was noted to have increased interstitial markings on chest x-ray. BMP and CBC were essentially unremarkable. CT of the chest showed coronary artery disease with mild emphysema. There was a moderate right pleural effusion and partial RLLconsolidation. The patient was started on intravenous furosemide.  Assessment/Plan: Acute on chronic systolic and diastolic CHF -appears to have biventricular failure -accurate I/O--incomplete, in part to poor pt compliance -holding metoprolol succinate due to acute decompensation with low EF--concerned this may also be contributing to his dyspnea on exertion -12/14/17 Echo--EF 20%, diffuse HK, grade 2 DD, mod to severe TR -remains clinically fluid overloaded -continue IV lasix>>>po lasix on 01/27/18 -NEG 9.7 lbs since admission  Acute respiratory failure with hypoxia  -due to pulmonary edema -weaned oxygen back to room air for saturation >  92% -personally reviewed CXR--increased interstitial markings, RLL  Second Degree AVB -s/p [facility] 2017 -saw Dr. Cristopher Peru 12/29/17--PPM working normally  AAA - followed by Vascular Surgery. At 5.6 cm by imaging in 06/2017.  Moderate Malnutrition -continue Ensure  Elevated troponin -due to decompensated CHF -trend is flat -personally reviewed EKG--AV paced -no chest pain presently  Hypokalemia -replete -check mag--2.0   Disposition Plan: Home 01/27/18 if stable Family Communication:daughter in law ([place]) updated at bedside 9/15  Consultants:none  Code Status: FULL   DVT Prophylaxis: [place] Lovenox   Procedures: As Listed in Progress Note Above  Antibiotics: None    Subjective: Patient denies fevers, chills, headache, chest pain, dyspnea, nausea, vomiting, diarrhea, abdominal pain, dysuria, hematuria, hematochezia, and melena.   Objective: Vitals:   01/26/18 0301 01/26/18 0400 01/26/18 0606 01/26/18 1450  BP: (!) 89/58  (!) 86/42 91/62  Pulse: 97  93 96  Resp:   20 16  Temp:   98.2 F (36.8 C) 97.9 F (36.6 C)  TempSrc:   Oral Oral  SpO2: 91% 95% 90% 95%  Weight:   57.9 kg   Height:        Intake/Output Summary (Last 24 hours) at 01/26/2018 1610 Last data filed at 01/26/2018 0700 Gross per 24 hour  Intake 328.7 ml  Output 1375 ml  Net -1046.3 ml   Weight change:  Exam:   General:  Pt is alert, follows commands appropriately, not in acute distress  HEENT: No icterus, No thrush, No neck mass, [place]/AT  Cardiovascular: RRR, S1/S2, no rubs, no gallops  Respiratory:right basilar crackles, no wheeze  Abdomen: Soft/+BS, non tender, non distended, no guarding  Extremities: No edema, No lymphangitis, No petechiae, No rashes, no synovitis   Data Reviewed: I have personally  reviewed following labs and imaging studies Basic Metabolic Panel: Recent Labs  Lab 01/23/18 1540 01/24/18 0424 01/25/18 0615 01/26/18 0457  NA 136 139  138 140  K 3.9 4.3 3.1* 3.1*  CL 104 109 103 101  CO2 25 24 29 29   GLUCOSE 109* 93 99 104*  BUN 19 15 17 18   CREATININE 0.86 0.80 0.83 0.91  CALCIUM 8.5* 8.4* 8.3* 8.8*  MG  --  2.3 2.0 2.0   Liver Function Tests: Recent Labs  Lab 01/23/18 1540  AST 20  ALT 11  ALKPHOS 94  BILITOT 0.9  PROT 7.8  ALBUMIN 3.1*   No results for input(s): LIPASE, AMYLASE in the last 168 hours. No results for input(s): AMMONIA in the last 168 hours. Coagulation Profile: No results for input(s): INR, PROTIME in the last 168 hours. CBC: Recent Labs  Lab 01/23/18 1540 01/24/18 0424  WBC 9.0 6.8  NEUTROABS 6.4  --   HGB 10.6* 9.8*  HCT 33.1* 31.2*  MCV 85.3 84.6  PLT 222 197   Cardiac Enzymes: Recent Labs  Lab 01/23/18 1540 01/23/18 1858  TROPONINI 0.03* 0.03*   BNP: Invalid input(s): POCBNP CBG: No results for input(s): GLUCAP in the last 168 hours. HbA1C: No results for input(s): HGBA1C in the last 72 hours. Urine analysis:    Component Value Date/Time   COLORURINE STRAW (A) 01/23/2018 1732   APPEARANCEUR CLEAR 01/23/2018 1732   LABSPEC 1.008 01/23/2018 1732   PHURINE 6.0 01/23/2018 1732   GLUCOSEU NEGATIVE 01/23/2018 1732   HGBUR NEGATIVE 01/23/2018 1732   BILIRUBINUR NEGATIVE 01/23/2018 1732   KETONESUR NEGATIVE 01/23/2018 1732   PROTEINUR NEGATIVE 01/23/2018 1732   NITRITE NEGATIVE 01/23/2018 1732   LEUKOCYTESUR TRACE (A) 01/23/2018 1732   Sepsis Labs: @LABRCNTIP (procalcitonin:4,lacticidven:4) ) Recent Results (from the past 240 hour(s))  MRSA PCR Screening     Status: None   Collection Time: 01/23/18  9:50 PM  Result Value Ref Range Status   MRSA by PCR NEGATIVE NEGATIVE Final    Comment:        The GeneXpert MRSA Assay (FDA approved for NASAL specimens only), is one component of a comprehensive MRSA colonization surveillance program. It is not intended to diagnose MRSA infection nor to guide or monitor treatment for MRSA infections. Performed at [facility], [address] [place], [place] [address]      Scheduled Meds: . enoxaparin (LOVENOX) injection  40 mg Subcutaneous Q24H  . feeding supplement (ENSURE ENLIVE)  237 mL Oral BID BM  . ferrous sulfate  325 mg Oral Daily  . guaiFENesin  600 mg Oral BID  . [START ON 01/27/2018] Influenza vac split quadrivalent PF  0.5 mL Intramuscular Tomorrow-1000  . loratadine  10 mg Oral Daily  . potassium chloride SA  40 mEq Oral Daily  . psyllium  1 packet Oral Daily  . sodium chloride flush  3 mL Intravenous Q12H   Continuous Infusions: . sodium chloride      Procedures/Studies: Dg Chest 2 View  Result Date: 01/23/2018 CLINICAL DATA:  History of pneumonia and continued shortness of breath. EXAM: CHEST - 2 VIEW COMPARISON:  01/12/2018 FINDINGS: Stable cardiomegaly. Stable appearance of the left dual chamber cardiac pacemaker. Severe degenerative changes at the right glenohumeral joint and previous left shoulder replacement. There is concern for increased densities at the right lung base with right pleural fluid. Patchy densities in both lungs could represent mild edema. Atherosclerotic calcifications at the aortic arch. IMPRESSION: Slightly increased densities at the  right lung base. Findings could represent consolidation/volume loss with right pleural fluid. Infectious etiology cannot be excluded. Question mild edema. Electronically Signed   By: Markus Daft M.D.   On: 01/23/2018 16:49   Dg Chest 2 View  Result Date: 01/12/2018 CLINICAL DATA:  Patient with worsening shortness of breath. EXAM: CHEST - 2 VIEW COMPARISON:  Chest radiograph 12/13/2017 FINDINGS: Multi lead pacer apparatus overlies the left hemithorax, leads are stable in position. Stable cardiomegaly. Tortuosity and calcification of the thoracic aorta. Coarse bilateral interstitial opacities. Heterogeneous opacities right lung base. Small bilateral pleural effusions. Thoracic spine degenerative changes. IMPRESSION: Cardiomegaly. Coarse  bilateral interstitial opacities may represent mild edema. Heterogeneous opacities right lung base may represent atelectasis or infection. Electronically Signed   By: Lovey Newcomer M.D.   On: 01/12/2018 17:02   Ct Chest W Contrast  Result Date: 01/23/2018 CLINICAL DATA:  Chest pain, short of breath EXAM: CT CHEST WITH CONTRAST TECHNIQUE: Multidetector CT imaging of the chest was performed during intravenous contrast administration. CONTRAST:  86mL ISOVUE-300 IOPAMIDOL (ISOVUE-300) INJECTION 61% COMPARISON:  01/23/2018 radiograph FINDINGS: Cardiovascular: Nonaneurysmal aorta. Moderate aortic atherosclerosis. Coronary vascular calcification. Mild cardiomegaly. No large pericardial effusion. Partially visualized cardiac pacing leads. Mediastinum/Nodes: Midline trachea. No thyroid mass. Right low paratracheal lymph node measures 13 mm. Esophagus within normal limits. Lungs/Pleura: Mild emphysema. Moderate right pleural effusion. Partial consolidation in the right lower lobe. No pneumothorax. Upper Abdomen: Calcified gallstones.  No acute abnormality Musculoskeletal: No acute or suspicious abnormality. Degenerative changes. IMPRESSION: 1. Moderate right pleural effusion with partial atelectasis or pneumonia at the right base. 2. Cardiomegaly 3. Gallstones 4. Mild emphysema Aortic Atherosclerosis (ICD10-I70.0) and Emphysema (ICD10-J43.9). Electronically Signed   By: Donavan Foil M.D.   On: 01/23/2018 17:46    Orson Eva, DO  [facility] Pager [phone]  If 7PM-7AM, please contact night-coverage [email or web address] Password [email or web address] 01/26/2018, 4:10 PM   LOS: 0 days

## 2018-01-26 NOTE — Progress Notes (Signed)
This RN just recently noticed HS BP inaccurate. BP now 89/53 left arm, 89/58 right arm. HR 97-100. Patient sleeping. [name], MD notified. Will continue to monitor.

## 2018-01-26 NOTE — Plan of Care (Signed)
  Problem: Acute Rehab PT Goals(only PT should resolve) Goal: Pt Will Go Supine/Side To Sit Outcome: Progressing Flowsheets (Taken 01/26/2018 1554) Pt will go Supine/Side to Sit: Independently Goal: Patient Will Transfer Sit To/From Stand Outcome: Progressing Flowsheets (Taken 01/26/2018 1554) Patient will transfer sit to/from stand: with modified independence Goal: Pt Will Transfer Bed To Chair/Chair To Bed Outcome: Progressing Flowsheets (Taken 01/26/2018 1554) Pt will Transfer Bed to Chair/Chair to Bed: with modified independence Goal: Pt Will Ambulate Outcome: Progressing Flowsheets (Taken 01/26/2018 1554) Pt will Ambulate: 100 feet; with supervision; with rolling walker   3:55 PM, 01/26/18 Lonell Grandchild, MPT Physical Therapist with [facility] [phone] office [phone] mobile phone

## 2018-01-26 NOTE — Evaluation (Signed)
Physical Therapy Evaluation Patient Details Name: Shawn Frye MRN: [id number] DOB: April 18, 1919 Today's Date: 01/26/2018   History of Present Illness  Shawn Frye is a 84 y.o. male with medical history significant for cardiomyopathy with systolic congestive heart failure and LVEF 20%, cardiac pacemaker with history of second-degree AV block, AAA, iron deficiency anemia, and recent discharge on 9/4 after treatment for sepsis with pneumonia to the right lower lobe, who presented to the ED with complaints of worsening shortness of breath over the last 2 days.  He has had worsening symptoms with laying flat and with some exertion.  He is also noted a mild dry cough that is nonproductive.  He denies any fevers or chills.  He has had some mild lower extremity edema as well.  He has continued to take his Lasix as prescribed and has finished his course of oral antibiotics a few days ago.    Clinical Impression  Patient functioning near baseline for functional mobility and gait, demonstrates good return for sitting up at bedside without assist, transferred to commode in bathroom for a BM, stood in front of sink to was his hands afterwards with supervision and ambulated in hallway without loss of balance.  Patient tolerated sitting up in chair after therapy with family member and home aide present at bedside - RN notified.  Patient will benefit from continued physical therapy in hospital and recommended venue below to increase strength, balance, endurance for safe ADLs and gait.    Follow Up Recommendations Home health PT;Supervision for mobility/OOB;Supervision - Intermittent    Equipment Recommendations  None recommended by PT    Recommendations for Other Services       Precautions / Restrictions Precautions Precautions: Fall Restrictions Weight Bearing Restrictions: No      Mobility  Bed Mobility Overal bed mobility: Modified Independent                Transfers Overall transfer  level: Needs assistance Equipment used: Rolling walker (2 wheeled) Transfers: Sit to/from [facility] Sit to Stand: Supervision Stand pivot transfers: Min guard       General transfer comment: slightly labored movement  Ambulation/Gait Ambulation/Gait assistance: Min guard Gait Distance (Feet): 85 Feet Assistive device: Rolling walker (2 wheeled) Gait Pattern/deviations: Decreased step length - right;Decreased step length - left;Decreased stride length;Trendelenburg Gait velocity: decreased   General Gait Details: slightly unsteady slow cadence with left trendelenburg like stepping wide base of support, no loss of balance  Stairs            Wheelchair Mobility    Modified Rankin (Stroke Patients Only)       Balance Overall balance assessment: Needs assistance Sitting-balance support: Feet supported;No upper extremity supported Sitting balance-Leahy Scale: Good     Standing balance support: Bilateral upper extremity supported;During functional activity Standing balance-Leahy Scale: Fair                               Pertinent Vitals/Pain Pain Assessment: No/denies pain    Home Living Family/patient expects to be discharged to:: Private residence Living Arrangements: Alone Available Help at Discharge: Family;Personal care attendant Type of Home: House Home Access: Level entry     Home Layout: One level Home Equipment: Cane - single point;Walker - 2 wheels;Bedside commode;Wheelchair - manual;Shower seat      Prior Function Level of Independence: Needs assistance   Gait / Transfers Assistance Needed: household ambulator with RW  ADL's /  Homemaking Assistance Needed: home aide 8 hours/day x 5 days/week, planning to hire someone for nights        Hand Dominance        Extremity/Trunk Assessment   Upper Extremity Assessment Upper Extremity Assessment: Generalized weakness    Lower Extremity Assessment Lower Extremity  Assessment: Generalized weakness    Cervical / Trunk Assessment Cervical / Trunk Assessment: Kyphotic  Communication   Communication: No difficulties;HOH  Cognition Arousal/Alertness: Awake/alert Behavior During Therapy: WFL for tasks assessed/performed Overall Cognitive Status: Within Functional Limits for tasks assessed                                        General Comments      Exercises     Assessment/Plan    PT Assessment Patient needs continued PT services  PT Problem List Decreased strength;Decreased activity tolerance;Decreased balance;Decreased mobility       PT Treatment Interventions Gait training;Stair training;Functional mobility training;Therapeutic activities;Manual techniques;Patient/family education    PT Goals (Current goals can be found in the Care Plan section)  Acute Rehab PT Goals Patient Stated Goal: return home with home aide and family to assist PT Goal Formulation: With patient/family Time For Goal Achievement: 02/02/18 Potential to Achieve Goals: Good    Frequency Min 3X/week   Barriers to discharge        Co-evaluation               AM-PAC PT "6 Clicks" Daily Activity  Outcome Measure Difficulty turning over in bed (including adjusting bedclothes, sheets and blankets)?: None Difficulty moving from lying on back to sitting on the side of the bed? : None Difficulty sitting down on and standing up from a chair with arms (e.g., wheelchair, bedside commode, etc,.)?: A Little Help needed moving to and from a bed to chair (including a wheelchair)?: A Little Help needed walking in hospital room?: A Little Help needed climbing 3-5 steps with a railing? : A Little 6 Click Score: 20    End of Session   Activity Tolerance: Patient tolerated treatment well;Patient limited by fatigue Patient left: with call bell/phone within reach;in chair;with family/visitor present;with chair alarm set Nurse Communication: Mobility  status PT Visit Diagnosis: Unsteadiness on feet (R26.81);Other abnormalities of gait and mobility (R26.89);Muscle weakness (generalized) (M62.81)    Time: 7062-3762 PT Time Calculation (min) (ACUTE ONLY): 32 min   Charges:   PT Evaluation $PT Eval Moderate Complexity: 1 Mod PT Treatments $Therapeutic Activity: 23-37 mins        3:53 PM, 01/26/18 Lonell Grandchild, MPT Physical Therapist with [facility] [phone] office [phone] mobile phone

## 2018-01-27 ENCOUNTER — Inpatient Hospital Stay (HOSPITAL_COMMUNITY): Payer: Medicare Other

## 2018-01-27 DIAGNOSIS — R1310 Dysphagia, unspecified: Secondary | ICD-10-CM

## 2018-01-27 DIAGNOSIS — R1312 Dysphagia, oropharyngeal phase: Secondary | ICD-10-CM

## 2018-01-27 LAB — BASIC METABOLIC PANEL
Anion gap: 8 (ref 5–15)
BUN: 24 mg/dL — AB (ref 8–23)
CALCIUM: 8.9 mg/dL (ref 8.9–10.3)
CO2: 29 mmol/L (ref 22–32)
CREATININE: 0.88 mg/dL (ref 0.61–1.24)
Chloride: 103 mmol/L (ref 98–111)
GFR calc non Af Amer: >60 mL/min (ref >60)
Glucose, Bld: 97 mg/dL (ref 70–99)
Potassium: 3.4 mmol/L — ABNORMAL LOW (ref 3.5–5.1)
Sodium: 140 mmol/L (ref 135–145)

## 2018-01-27 MED ORDER — ENOXAPARIN SODIUM 40 MG/0.4ML ~~LOC~~ SOLN: 40.0000 mg | SUBCUTANEOUS | Status: DC

## 2018-01-27 NOTE — Progress Notes (Signed)
Physical Therapy Treatment Patient Details Name: Shawn Frye MRN: [id number] DOB: 1918/06/14 Today's Date: 01/27/2018    History of Present Illness Shawn Frye is a 84 y.o. male with medical history significant for cardiomyopathy with systolic congestive heart failure and LVEF 20%, cardiac pacemaker with history of second-degree AV block, AAA, iron deficiency anemia, and recent discharge on 9/4 after treatment for sepsis with pneumonia to the right lower lobe, who presented to the ED with complaints of worsening shortness of breath over the last 2 days.  He has had worsening symptoms with laying flat and with some exertion.  He is also noted a mild dry cough that is nonproductive.  He denies any fevers or chills.  He has had some mild lower extremity edema as well.  He has continued to take his Lasix as prescribed and has finished his course of oral antibiotics a few days ago.    PT Comments    Pt received in bed, aide in room, RN also in room preparing to give meds.Pt agreeable to working with PT, motivated to perform OOB mobility in spite of feeling poorly. Pt able to demonstrate progression in tolerated AMB distances, albeit he reports feeling weaker overall, which he blames on being unable to eat. 2 Sets of STS transfers from chair demonstrate improved motor planning, which translates into lower effort to perform and less time requirement. Pt progressing well in general toward all goals.     Follow Up Recommendations  Home health PT;Supervision for mobility/OOB;Supervision - Intermittent     Equipment Recommendations  None recommended by PT    Recommendations for Other Services       Precautions / Restrictions Precautions Precautions: Fall Restrictions Weight Bearing Restrictions: No    Mobility  Bed Mobility Overal bed mobility: Modified Independent;Needs Assistance Bed Mobility: Supine to Sit     Supine to sit: Min guard;Min assist     General bed mobility comments: pt  asks forhand hold topull selftolongsitting,then transitiontoEOB without addiitonalsupport  Transfers Overall transfer level: Needs assistance Equipment used: Rolling walker (2 wheeled) Transfers: Sit to/from Stand Sit to Stand: Supervision         General transfer comment: Performs 2 sets of STS transfers 6 reps, encouraged to push up from chair rather thanRW  Ambulation/Gait Ambulation/Gait assistance: Min guard Gait Distance (Feet): 140 Feet Assistive device: Rolling walker (2 wheeled) Gait Pattern/deviations: Decreased step length - right;Decreased step length - left;Decreased stride length;Trendelenburg Gait velocity: 0.40m/s  Gait velocity interpretation: <1.31 ft/sec, indicative of household ambulator General Gait Details: cotninued abrupt mechanically mediated motor control deficits as perbaseline, includes Right gluteal insufficicency and flexed knee posturing.    Stairs             Wheelchair Mobility    Modified Rankin (Stroke Patients Only)       Balance Overall balance assessment: Needs assistance   Sitting balance-Leahy Scale: Good     Standing balance support: During functional activity;Single extremity supported Standing balance-Leahy Scale: Good                              Cognition Arousal/Alertness: Awake/alert Behavior During Therapy: WFL for tasks assessed/performed Overall Cognitive Status: Within Functional Limits for tasks assessed  Exercises      General Comments        Pertinent Vitals/Pain Pain Assessment: No/denies pain    Home Living                      Prior Function            PT Goals (current goals can now be found in the care plan section) Acute Rehab PT Goals Patient Stated Goal: return home with home aide and family to assist PT Goal Formulation: With patient/family Time For Goal Achievement: 02/02/18 Potential to Achieve Goals:  Good Progress towards PT goals: Progressing toward goals    Frequency    Min 3X/week      PT Plan Current plan remains appropriate    Co-evaluation              AM-PAC PT "6 Clicks" Daily Activity  Outcome Measure    Difficulty moving from lying on back to sitting on the side of the bed? : A Lot Difficulty sitting down on and standing up from a chair with arms (e.g., wheelchair, bedside commode, etc,.)?: A Little Help needed moving to and from a bed to chair (including a wheelchair)?: A Little Help needed walking in hospital room?: A Little Help needed climbing 3-5 steps with a railing? : A Lot 6 Click Score: 13    End of Session Equipment Utilized During Treatment: Gait belt Activity Tolerance: Patient tolerated treatment well;Patient limited by fatigue Patient left: with call bell/phone within reach;in chair;with family/visitor present;with chair alarm set;with nursing/sitter in room Nurse Communication: Mobility status PT Visit Diagnosis: Unsteadiness on feet (R26.81);Other abnormalities of gait and mobility (R26.89);Muscle weakness (generalized) (M62.81)     Time: 6381-7711 PT Time Calculation (min) (ACUTE ONLY): 19 min  Charges:  $Therapeutic Activity: 8-22 mins                     12:40 PM, 01/27/18 Etta Grandchild, PT, DPT Physical Therapist - [place] [phone] (Office)    Buccola,Allan C 01/27/2018, 12:37 PM

## 2018-01-27 NOTE — Progress Notes (Signed)
PROGRESS NOTE  Shawn Frye XBW:[id number] DOB: 07-29-1918 DOA: 01/23/2018 PCP: [name], MD Brief History: 84 y.o.malewith past medical history of symptomatic bradycardiadue to 2nd degree AVB(s/p [employer] PPM placement in 2017), GERD, and AAA who presentswith one week hx of worsening sob and LE edema x 2 days.The patient denies any fevers, chills, chest pain, nausea, vomiting, diarrhea, abdominal pain. However, he complains of a nonproductive cough. The patient has had 2 recent hospitalizations. Most recently, the patient was recently admitted from 01/12/2018 through 01/14/2018 at which time he was treated for pneumonia. He was discharged home with amox/clav.He states that he has finished his antibiotics. He states, "I've been drinking plenty of fluid."In addition, the patient was also admitted previously from 12/13/2017 through 12/18/2017 for acute respiratory failure secondary to acute on chronic systolic CHF. He was discharged home with furosemide 40 mg daily. Upon presentation, the patient was noted to have increased interstitial markings on chest x-ray. BMP and CBC were essentially unremarkable. CT of the chest showed coronary artery disease with mild emphysema. There was a moderate right pleural effusion and partial RLLconsolidation. The patient was started on intravenous furosemide.  Assessment/Plan: Acute on chronic systolic and diastolic CHF -appears to have biventricular failure -accurate I/O--incomplete, in part to poor pt compliance -holding metoprolol succinate due to acute decompensation with low EF--concerned this may also be contributing to his dyspnea on exertion and soft BPs -12/14/17 Echo--EF 20%, diffuse HK, grade 2 DD, mod to severe TR -remains clinically fluid overloaded -continue IV lasix>>>po lasix on 01/27/18 -NEG 11.5 lbs since admission  Acute respiratory failure with hypoxia  -due to pulmonary edema -weanedoxygen back to room air for  saturation >92% -personally reviewed CXR--increased interstitial markings, RLL  Dysphagia/Esophageal stricture -01/27/18 esophagram--suggestion of a distal esophageal mucosal ring; Nonspecific esophageal dysmotility disorder -GI consulted -discussed with Dr. Tanja Port EGD 01/28/18; clear liquids for breakfast  Second Degree AVB -s/p [facility] 2017 -saw Dr. Cristopher Peru 12/29/17--PPM working normally  AAA - followed by Vascular Surgery. At 5.6 cm by imaging in 06/2017.  Moderate Malnutrition -continue Ensure  Elevated troponin -due to decompensated CHF -trend is flat -personally reviewed EKG--AV paced -no chest pain presently  Hypokalemia -replete -check mag--2.0   Disposition Plan: Home 01/28/18 if cleared by Dr. Laural Golden Family Communication:daughter in law (Glenvar) updated 01/27/18  Consultants:none  Code Status: FULL   DVT Prophylaxis: [place] Lovenox   Procedures: As Listed in Progress Note Above  Antibiotics: None    Subjective: Pt c/o dysphagia.  Denies cp, n/v/d, abd pain.  Breathing "back to normal".  No cough.  No f/c/  Objective: Vitals:   01/26/18 2235 01/27/18 0500 01/27/18 0711 01/27/18 1120  BP: 92/62  (!) 86/52   Pulse:   91 (!) 106  Resp:   18   Temp:   97.8 F (36.6 C)   TempSrc:   Oral   SpO2:   93% 96%  Weight:  57.1 kg    Height:        Intake/Output Summary (Last 24 hours) at 01/27/2018 1657 Last data filed at 01/27/2018 0700 Gross per 24 hour  Intake 120 ml  Output 250 ml  Net -130 ml   Weight change: -0.8 kg Exam:   General:  Pt is alert, follows commands appropriately, not in acute distress  HEENT: No icterus, No thrush, No neck mass, [place]/AT  Cardiovascular: RRR, S1/S2, no rubs, no gallops  Respiratory: fine bibasilar crackles, no wheeze  Abdomen: Soft/+BS, non tender, non distended, no guarding  Extremities: No edema, No lymphangitis, No petechiae, No rashes, no synovitis   Data Reviewed: I have  personally reviewed following labs and imaging studies Basic Metabolic Panel: Recent Labs  Lab 01/23/18 1540 01/24/18 0424 01/25/18 0615 01/26/18 0457 01/27/18 0533  NA 136 139 138 140 140  K 3.9 4.3 3.1* 3.1* 3.4*  CL 104 109 103 101 103  CO2 25 24 29 29 29   GLUCOSE 109* 93 99 104* 97  BUN 19 15 17 18  24*  CREATININE 0.86 0.80 0.83 0.91 0.88  CALCIUM 8.5* 8.4* 8.3* 8.8* 8.9  MG  --  2.3 2.0 2.0  --    Liver Function Tests: Recent Labs  Lab 01/23/18 1540  AST 20  ALT 11  ALKPHOS 94  BILITOT 0.9  PROT 7.8  ALBUMIN 3.1*   No results for input(s): LIPASE, AMYLASE in the last 168 hours. No results for input(s): AMMONIA in the last 168 hours. Coagulation Profile: No results for input(s): INR, PROTIME in the last 168 hours. CBC: Recent Labs  Lab 01/23/18 1540 01/24/18 0424  WBC 9.0 6.8  NEUTROABS 6.4  --   HGB 10.6* 9.8*  HCT 33.1* 31.2*  MCV 85.3 84.6  PLT 222 197   Cardiac Enzymes: Recent Labs  Lab 01/23/18 1540 01/23/18 1858  TROPONINI 0.03* 0.03*   BNP: Invalid input(s): POCBNP CBG: No results for input(s): GLUCAP in the last 168 hours. HbA1C: No results for input(s): HGBA1C in the last 72 hours. Urine analysis:    Component Value Date/Time   COLORURINE STRAW (A) 01/23/2018 1732   APPEARANCEUR CLEAR 01/23/2018 1732   LABSPEC 1.008 01/23/2018 1732   PHURINE 6.0 01/23/2018 1732   GLUCOSEU NEGATIVE 01/23/2018 1732   HGBUR NEGATIVE 01/23/2018 1732   BILIRUBINUR NEGATIVE 01/23/2018 1732   KETONESUR NEGATIVE 01/23/2018 1732   PROTEINUR NEGATIVE 01/23/2018 1732   NITRITE NEGATIVE 01/23/2018 1732   LEUKOCYTESUR TRACE (A) 01/23/2018 1732   Sepsis Labs: @LABRCNTIP (procalcitonin:4,lacticidven:4) ) Recent Results (from the past 240 hour(s))  MRSA PCR Screening     Status: None   Collection Time: 01/23/18  9:50 PM  Result Value Ref Range Status   MRSA by PCR NEGATIVE NEGATIVE Final    Comment:        The GeneXpert MRSA Assay (FDA approved for NASAL  specimens only), is one component of a comprehensive MRSA colonization surveillance program. It is not intended to diagnose MRSA infection nor to guide or monitor treatment for MRSA infections. Performed at [facility], [address] [place], [place] [address]      Scheduled Meds: . [START ON 01/28/2018] enoxaparin (LOVENOX) injection  40 mg Subcutaneous Q24H  . feeding supplement (ENSURE ENLIVE)  237 mL Oral BID BM  . ferrous sulfate  325 mg Oral Daily  . furosemide  40 mg Oral BID  . guaiFENesin  600 mg Oral BID  . loratadine  10 mg Oral Daily  . potassium chloride SA  40 mEq Oral Daily  . psyllium  1 packet Oral Daily  . sodium chloride flush  3 mL Intravenous Q12H   Continuous Infusions: . sodium chloride      Procedures/Studies: Dg Chest 2 View  Result Date: 01/23/2018 CLINICAL DATA:  History of pneumonia and continued shortness of breath. EXAM: CHEST - 2 VIEW COMPARISON:  01/12/2018 FINDINGS: Stable cardiomegaly. Stable appearance of the left dual chamber cardiac pacemaker. Severe degenerative changes at the right glenohumeral joint and previous left shoulder replacement.  There is concern for increased densities at the right lung base with right pleural fluid. Patchy densities in both lungs could represent mild edema. Atherosclerotic calcifications at the aortic arch. IMPRESSION: Slightly increased densities at the right lung base. Findings could represent consolidation/volume loss with right pleural fluid. Infectious etiology cannot be excluded. Question mild edema. Electronically Signed   By: Markus Daft M.D.   On: 01/23/2018 16:49   Dg Chest 2 View  Result Date: 01/12/2018 CLINICAL DATA:  Patient with worsening shortness of breath. EXAM: CHEST - 2 VIEW COMPARISON:  Chest radiograph 12/13/2017 FINDINGS: Multi lead pacer apparatus overlies the left hemithorax, leads are stable in position. Stable cardiomegaly. Tortuosity and calcification of the thoracic aorta. Coarse  bilateral interstitial opacities. Heterogeneous opacities right lung base. Small bilateral pleural effusions. Thoracic spine degenerative changes. IMPRESSION: Cardiomegaly. Coarse bilateral interstitial opacities may represent mild edema. Heterogeneous opacities right lung base may represent atelectasis or infection. Electronically Signed   By: Lovey Newcomer M.D.   On: 01/12/2018 17:02   Ct Chest W Contrast  Result Date: 01/23/2018 CLINICAL DATA:  Chest pain, short of breath EXAM: CT CHEST WITH CONTRAST TECHNIQUE: Multidetector CT imaging of the chest was performed during intravenous contrast administration. CONTRAST:  72mL ISOVUE-300 IOPAMIDOL (ISOVUE-300) INJECTION 61% COMPARISON:  01/23/2018 radiograph FINDINGS: Cardiovascular: Nonaneurysmal aorta. Moderate aortic atherosclerosis. Coronary vascular calcification. Mild cardiomegaly. No large pericardial effusion. Partially visualized cardiac pacing leads. Mediastinum/Nodes: Midline trachea. No thyroid mass. Right low paratracheal lymph node measures 13 mm. Esophagus within normal limits. Lungs/Pleura: Mild emphysema. Moderate right pleural effusion. Partial consolidation in the right lower lobe. No pneumothorax. Upper Abdomen: Calcified gallstones.  No acute abnormality Musculoskeletal: No acute or suspicious abnormality. Degenerative changes. IMPRESSION: 1. Moderate right pleural effusion with partial atelectasis or pneumonia at the right base. 2. Cardiomegaly 3. Gallstones 4. Mild emphysema Aortic Atherosclerosis (ICD10-I70.0) and Emphysema (ICD10-J43.9). Electronically Signed   By: Donavan Foil M.D.   On: 01/23/2018 17:46   Dg Esophagus  Result Date: 01/27/2018 CLINICAL DATA:  Dysphagia starting this morning. EXAM: ESOPHOGRAM/BARIUM SWALLOW TECHNIQUE: Single contrast examination was performed using  thin barium. FLUOROSCOPY TIME:  Fluoroscopy Time:  2 minutes, 12 seconds Radiation Exposure Index (if provided by the fluoroscopic device): 12.4 mGy Number  of Acquired Spot Images: 0 COMPARISON:  CT chest from 01/23/2018 FINDINGS: Primary peristaltic waves are disrupted in the upper to mid esophagus on all swallows. There some secondary and tertiary contractions along with minimal distal esophageal fold thickening. Primarily because of the apparent dysmotility, I was unable to distend the distal most esophagus beyond about 12 mm. There is a suggestion of a distal esophageal mucosal ring for example on image 9/9. I did administer a 13 mm in diameter barium tablet. This demonstrated stasis in the mid esophagus due to the dysmotility despite having the patient drink multiple swallows of water. Accordingly, this tablet did not test the suspected distal esophageal mucosal ring for patency/diameter. Dual lead pacer noted. IMPRESSION: 1. Nonspecific esophageal dysmotility disorder, with disruption of primary peristaltic waves in the proximal to midthoracic esophagus on all swallows. 2. There is a suggestion of a distal esophageal mucosal ring. 3. I was unable to distend the distal esophagus beyond about 12 mm, although I am not certain if this is because of the dysmotility causing poor distension, or whether there is a true significant narrowing. No irregularity at the gastroesophageal junction aside from the suspected mucosal ring. Electronically Signed   By: Van Clines M.D.   On: 01/27/2018  13:46    Orson Eva, DO  [facility] Pager [phone]  If 7PM-7AM, please contact night-coverage [email or web address] Password [email or web address] 01/27/2018, 4:57 PM   LOS: 1 day

## 2018-01-28 ENCOUNTER — Encounter (HOSPITAL_COMMUNITY)
Admission: EM | Disposition: A | Discharge status: Home Health | Payer: Self-pay | Source: Home / Self Care | Attending: Internal Medicine

## 2018-01-28 ENCOUNTER — Ambulatory Visit: Payer: Self-pay | Admitting: *Deleted

## 2018-01-28 ENCOUNTER — Other Ambulatory Visit: Payer: Self-pay

## 2018-01-28 DIAGNOSIS — I441 Atrioventricular block, second degree: Secondary | ICD-10-CM

## 2018-01-28 DIAGNOSIS — R1314 Dysphagia, pharyngoesophageal phase: Secondary | ICD-10-CM

## 2018-01-28 DIAGNOSIS — I5043 Acute on chronic combined systolic (congestive) and diastolic (congestive) heart failure: Principal | ICD-10-CM

## 2018-01-28 DIAGNOSIS — I714 Abdominal aortic aneurysm, without rupture: Secondary | ICD-10-CM

## 2018-01-28 DIAGNOSIS — J90 Pleural effusion, not elsewhere classified: Secondary | ICD-10-CM

## 2018-01-28 DIAGNOSIS — R1310 Dysphagia, unspecified: Secondary | ICD-10-CM

## 2018-01-28 DIAGNOSIS — J9601 Acute respiratory failure with hypoxia: Secondary | ICD-10-CM

## 2018-01-28 HISTORY — PX: ESOPHAGOGASTRODUODENOSCOPY: SHX5428

## 2018-01-28 HISTORY — PX: BIOPSY: SHX5522

## 2018-01-28 LAB — BASIC METABOLIC PANEL
ANION GAP: 8 (ref 5–15)
BUN: 21 mg/dL (ref 8–23)
CALCIUM: 8.7 mg/dL — AB (ref 8.9–10.3)
CO2: 30 mmol/L (ref 22–32)
CREATININE: 0.91 mg/dL (ref 0.61–1.24)
Chloride: 100 mmol/L (ref 98–111)
GFR calc Af Amer: >60 mL/min (ref >60)
Glucose, Bld: 93 mg/dL (ref 70–99)
Potassium: 3.4 mmol/L — ABNORMAL LOW (ref 3.5–5.1)
SODIUM: 138 mmol/L (ref 135–145)

## 2018-01-28 SURGERY — EGD (ESOPHAGOGASTRODUODENOSCOPY)
Anesthesia: Moderate Sedation

## 2018-01-28 MED ORDER — LIDOCAINE VISCOUS HCL 2 % MT SOLN
OROMUCOSAL | Status: DC | PRN
  Administered 2018-01-28: 1 via OROMUCOSAL

## 2018-01-28 MED ORDER — MIDAZOLAM HCL 5 MG/5ML IJ SOLN
INTRAMUSCULAR | Status: AC
  Filled 2018-01-28: qty 10

## 2018-01-28 MED ORDER — MEPERIDINE HCL 50 MG/ML IJ SOLN
INTRAMUSCULAR | Status: AC
  Filled 2018-01-28: qty 1

## 2018-01-28 MED ORDER — SODIUM CHLORIDE 0.9% FLUSH
INTRAVENOUS | Status: AC
  Filled 2018-01-28: qty 10

## 2018-01-28 MED ORDER — ENOXAPARIN SODIUM 40 MG/0.4ML ~~LOC~~ SOLN: 40.0000 mg | SUBCUTANEOUS | Status: DC

## 2018-01-28 MED ORDER — LIDOCAINE VISCOUS HCL 2 % MT SOLN
OROMUCOSAL | Status: AC
  Filled 2018-01-28: qty 15

## 2018-01-28 MED ORDER — MIDAZOLAM HCL 5 MG/5ML IJ SOLN
INTRAMUSCULAR | Status: AC
  Filled 2018-01-28: qty 5

## 2018-01-28 MED ORDER — MIDAZOLAM HCL 5 MG/5ML IJ SOLN
INTRAMUSCULAR | Status: DC | PRN
  Administered 2018-01-28: 1 mg via INTRAVENOUS
  Administered 2018-01-28: 0.5 mg via INTRAVENOUS

## 2018-01-28 MED ORDER — STERILE WATER FOR IRRIGATION IR SOLN
Status: DC | PRN
  Administered 2018-01-28: 1.25 mL

## 2018-01-28 MED ORDER — SODIUM CHLORIDE 0.9 % IV SOLN
INTRAVENOUS | Status: DC
  Administered 2018-01-28: 14:00:00 via INTRAVENOUS

## 2018-01-28 NOTE — Progress Notes (Signed)
Physical Therapy Treatment Patient Details Name: Shawn Frye MRN: [id number] DOB: 02/15/1919 Today's Date: 01/28/2018    History of Present Illness Shawn Frye is a 84 y.o. male with medical history significant for cardiomyopathy with systolic congestive heart failure and LVEF 20%, cardiac pacemaker with history of second-degree AV block, AAA, iron deficiency anemia, and recent discharge on 9/4 after treatment for sepsis with pneumonia to the right lower lobe, who presented to the ED with complaints of worsening shortness of breath over the last 2 days.  He has had worsening symptoms with laying flat and with some exertion.  He is also noted a mild dry cough that is nonproductive.  He denies any fevers or chills.  He has had some mild lower extremity edema as well.  He has continued to take his Lasix as prescribed and has finished his course of oral antibiotics a few days ago.    PT Comments    Patient demonstrates improvement for sitting up at bedside with head of bed raised, able to sit to stand by pushing off bed without AD, but tends to pull self up when using RW, however, self corrected and used one hand on walker and other hand to push from bedside to complete sit to stand with RW.  Patient ambulated in hallway without loss of balance, required a wide turn when returning to room and tolerated sitting up in chair after therapy.  Patient will benefit from continued physical therapy in hospital and recommended venue below to increase strength, balance, endurance for safe ADLs and gait.   Follow Up Recommendations  Home health PT;Supervision for mobility/OOB;Supervision - Intermittent     Equipment Recommendations  None recommended by PT    Recommendations for Other Services       Precautions / Restrictions Restrictions Weight Bearing Restrictions: No    Mobility  Bed Mobility Overal bed mobility: Modified Independent Bed Mobility: Supine to Sit           General bed mobility  comments: head of bed raised  Transfers Overall transfer level: Needs assistance Equipment used: Rolling walker (2 wheeled);None Transfers: Sit to/from [employer] to Stand: Min guard Stand pivot transfers: Min guard       General transfer comment: stood up without AD without problem, tends to pull self up when using RW  Ambulation/Gait Ambulation/Gait assistance: Min guard Gait Distance (Feet): 120 Feet Assistive device: Rolling walker (2 wheeled) Gait Pattern/deviations: Decreased step length - right;Decreased step length - left;Decreased stride length;Trendelenburg Gait velocity: decreased   General Gait Details: demonstrates slightly labored cadence with left hip drop possibly due to right hip weakness when taking steps, no loss of balance, has to make wide turn when returning to room   Stairs             Wheelchair Mobility    Modified Rankin (Stroke Patients Only)       Balance Overall balance assessment: Needs assistance Sitting-balance support: Feet supported;No upper extremity supported Sitting balance-Leahy Scale: Good     Standing balance support: During functional activity;Bilateral upper extremity supported Standing balance-Leahy Scale: Fair Standing balance comment: fair/good with RW                            Cognition Arousal/Alertness: Awake/alert Behavior During Therapy: WFL for tasks assessed/performed Overall Cognitive Status: Within Functional Limits for tasks assessed  Exercises General Exercises - Lower Extremity Long Arc Quad: Seated;AROM;Strengthening;Both;10 reps Hip Flexion/Marching: Seated;AROM;Both;10 reps;Strengthening Toe Raises: Seated;Strengthening;AROM;Both;10 reps Heel Raises: Seated;AROM;Strengthening;Both;10 reps    General Comments        Pertinent Vitals/Pain Pain Assessment: No/denies pain    Home Living                       Prior Function            PT Goals (current goals can now be found in the care plan section) Acute Rehab PT Goals Patient Stated Goal: return home with home aide and family to assist PT Goal Formulation: With patient Time For Goal Achievement: 02/02/18 Potential to Achieve Goals: Good Progress towards PT goals: Progressing toward goals    Frequency    Min 3X/week      PT Plan Current plan remains appropriate    Co-evaluation              AM-PAC PT "6 Clicks" Daily Activity  Outcome Measure  Difficulty turning over in bed (including adjusting bedclothes, sheets and blankets)?: None Difficulty moving from lying on back to sitting on the side of the bed? : None Difficulty sitting down on and standing up from a chair with arms (e.g., wheelchair, bedside commode, etc,.)?: A Little Help needed moving to and from a bed to chair (including a wheelchair)?: A Little Help needed walking in hospital room?: A Little Help needed climbing 3-5 steps with a railing? : A Lot 6 Click Score: 19    End of Session   Activity Tolerance: Patient tolerated treatment well;Patient limited by fatigue Patient left: in chair;with call bell/phone within reach;with chair alarm set Nurse Communication: Mobility status PT Visit Diagnosis: Unsteadiness on feet (R26.81);Other abnormalities of gait and mobility (R26.89);Muscle weakness (generalized) (M62.81)     Time: 2620-3559 PT Time Calculation (min) (ACUTE ONLY): 28 min  Charges:  $Therapeutic Exercise: 8-22 mins $Therapeutic Activity: 8-22 mins                     3:09 PM, 01/28/18 Lonell Grandchild, MPT Physical Therapist with [facility] [phone] office [phone] mobile phone

## 2018-01-28 NOTE — Care Management Important Message (Signed)
Important Message  Patient Details  Name: Shawn Frye MRN: [id number] Date of Birth: August 04, 1918   Medicare Important Message Given:  Yes    [name] 01/28/2018, 11:56 AM

## 2018-01-28 NOTE — Progress Notes (Signed)
PROGRESS NOTE  Shawn Frye:[id number] DOB: [birth year] DOA: 01/23/2018 PCP: [name], MD Brief History: 84 y.o.malewith past medical history of symptomatic bradycardiadue to 2nd degree AVB(s/p [employer] PPM placement in 2017), GERD, and AAA who presentswith one week hx of worsening sob and LE edema x 2 days.The patient denies any fevers, chills, chest pain, nausea, vomiting, diarrhea, abdominal pain. However, he complains of a nonproductive cough. The patient has had 2 recent hospitalizations. Most recently, the patient was recently admitted from 01/12/2018 through 01/14/2018 at which time he was treated for pneumonia. He was discharged home with amox/clav.He states that he has finished his antibiotics. He states, "I've been drinking plenty of fluid."In addition, the patient was also admitted previously from 12/13/2017 through 12/18/2017 for acute respiratory failure secondary to acute on chronic systolic CHF. He was discharged home with furosemide 40 mg daily. Upon presentation, the patient was noted to have increased interstitial markings on chest x-ray. BMP and CBC were essentially unremarkable. CT of the chest showed coronary artery disease with mild emphysema. There was a moderate right pleural effusion and partial RLLconsolidation. The patient was started on intravenous furosemide.  Assessment/Plan: Acute on chronic systolic and diastolic CHF -appears to have biventricular failure -accurate I/O--incomplete, in part to poor pt compliance -holding metoprolol succinate due to acute decompensation with low EF--concerned this may also be contributing to his dyspnea on exertion and soft BPs -12/14/17 Echo--EF 20%, diffuse HK, grade 2 DD, mod to severe TR -remains clinically fluid overloaded -continue IV lasix>>>po lasix on 01/27/18 -NEG 11.5 lbs since admission  Acute respiratory failure with hypoxia  -due to pulmonary edema -weanedoxygen back to room air for  saturation >92% -personally reviewed CXR--increased interstitial markings, RLL  Dysphagia/Esophageal stricture -01/27/18 esophagram--suggestion of a distal esophageal mucosal ring; Nonspecific esophageal dysmotility disorder -GI consulted -EGD with results as below today. He did undergo dilatation of distal esophagus, but suspect component of motility disorder. Advancing diet today  Second Degree AVB -s/p [facility] 2017 -saw Dr. Cristopher Peru 12/29/17--PPM working normally  AAA - followed by Vascular Surgery. At 5.6 cm by imaging in 06/2017.  Moderate Malnutrition -continue Ensure  Elevated troponin -due to decompensated CHF -trend is flat -personally reviewed EKG--AV paced -no chest pain presently  Hypokalemia -replete -check mag--2.0   Disposition Plan: Home 01/29/18 if cleared by Dr. Laural Golden Family Communication:daughter in law ([place]) updated 01/28/18  Consultants:none  Code Status: FULL   DVT Prophylaxis: Baker Lovenox   Procedures: EGD: - Normal esophagus.                           - Benign-appearing esophageal stenosis.                           - 2 cm hiatal hernia.                           - Portal hypertensive gastropathy.                           - Gastritis. Biopsied.                           Comment: Discrepency between EGD and GI study  findings.                           He possibly has significant esophageal dysmotility.                           - Normal duodenal bulb and second portion of the                            duodenum.  Antibiotics: None    Subjective: Still has dysphagia. Denies any shortness of breath, no vomiting  Objective: Vitals:   01/28/18 1605 01/28/18 1610 01/28/18 1615 01/28/18 1642  BP: (!) 91/57 (!) 89/59 (!) 83/59 120/72  Pulse: 89 89 88 93  Resp: (!) 26 (!) 26 (!) 28 18  Temp:    98.3 F (36.8 C)  TempSrc:    Oral  SpO2: 97% 96% 94% 94%  Weight:      Height:         Intake/Output Summary (Last 24 hours) at 01/28/2018 1930 Last data filed at 01/28/2018 1647 Gross per 24 hour  Intake 54 ml  Output 200 ml  Net -146 ml   Weight change: -0.809 kg Exam:  General exam: Alert, awake, oriented x 3 Respiratory system: Clear to auscultation. Respiratory effort normal. Cardiovascular system:RRR. No murmurs, rubs, gallops. Gastrointestinal system: Abdomen is nondistended, soft and nontender. No organomegaly or masses felt. Normal bowel sounds heard. Central nervous system: Alert and oriented. No focal neurological deficits. Extremities: No C/C/E, +pedal pulses Skin: No rashes, lesions or ulcers  Psychiatry: Judgement and insight appear normal. Mood & affect appropriate.   Data Reviewed: I have personally reviewed following labs and imaging studies Basic Metabolic Panel: Recent Labs  Lab 01/24/18 0424 01/25/18 0615 01/26/18 0457 01/27/18 0533 01/28/18 0600  NA 139 138 140 140 138  K 4.3 3.1* 3.1* 3.4* 3.4*  CL 109 103 101 103 100  CO2 24 29 29 29 30   GLUCOSE 93 99 104* 97 93  BUN 15 17 18  24* 21  CREATININE 0.80 0.83 0.91 0.88 0.91  CALCIUM 8.4* 8.3* 8.8* 8.9 8.7*  MG 2.3 2.0 2.0  --   --    Liver Function Tests: Recent Labs  Lab 01/23/18 1540  AST 20  ALT 11  ALKPHOS 94  BILITOT 0.9  PROT 7.8  ALBUMIN 3.1*   No results for input(s): LIPASE, AMYLASE in the last 168 hours. No results for input(s): AMMONIA in the last 168 hours. Coagulation Profile: No results for input(s): INR, PROTIME in the last 168 hours. CBC: Recent Labs  Lab 01/23/18 1540 01/24/18 0424  WBC 9.0 6.8  NEUTROABS 6.4  --   HGB 10.6* 9.8*  HCT 33.1* 31.2*  MCV 85.3 84.6  PLT 222 197   Cardiac Enzymes: Recent Labs  Lab 01/23/18 1540 01/23/18 1858  TROPONINI 0.03* 0.03*   BNP: Invalid input(s): POCBNP CBG: No results for input(s): GLUCAP in the last 168 hours. HbA1C: No results for input(s): HGBA1C in the last 72 hours. Urine analysis:     Component Value Date/Time   COLORURINE STRAW (A) 01/23/2018 1732   APPEARANCEUR CLEAR 01/23/2018 1732   LABSPEC 1.008 01/23/2018 1732   PHURINE 6.0 01/23/2018 1732   GLUCOSEU NEGATIVE 01/23/2018 1732   HGBUR NEGATIVE 01/23/2018 1732   BILIRUBINUR NEGATIVE 01/23/2018 1732   KETONESUR NEGATIVE 01/23/2018 1732   PROTEINUR NEGATIVE 01/23/2018 1732  NITRITE NEGATIVE 01/23/2018 1732   LEUKOCYTESUR TRACE (A) 01/23/2018 1732   Sepsis Labs: @LABRCNTIP (procalcitonin:4,lacticidven:4) ) Recent Results (from the past 240 hour(s))  MRSA PCR Screening     Status: None   Collection Time: 01/23/18  9:50 PM  Result Value Ref Range Status   MRSA by PCR NEGATIVE NEGATIVE Final    Comment:        The GeneXpert MRSA Assay (FDA approved for NASAL specimens only), is one component of a comprehensive MRSA colonization surveillance program. It is not intended to diagnose MRSA infection nor to guide or monitor treatment for MRSA infections. Performed at [facility], [address] [place], [place] [address]      Scheduled Meds: . [START ON 01/29/2018] enoxaparin (LOVENOX) injection  40 mg Subcutaneous Q24H  . feeding supplement (ENSURE ENLIVE)  237 mL Oral BID BM  . ferrous sulfate  325 mg Oral Daily  . furosemide  40 mg Oral BID  . guaiFENesin  600 mg Oral BID  . lidocaine      . loratadine  10 mg Oral Daily  . midazolam      . potassium chloride SA  40 mEq Oral Daily  . psyllium  1 packet Oral Daily  . sodium chloride flush  3 mL Intravenous Q12H  . sodium chloride flush       Continuous Infusions: . sodium chloride      Procedures/Studies: Dg Chest 2 View  Result Date: 01/23/2018 CLINICAL DATA:  History of pneumonia and continued shortness of breath. EXAM: CHEST - 2 VIEW COMPARISON:  01/12/2018 FINDINGS: Stable cardiomegaly. Stable appearance of the left dual chamber cardiac pacemaker. Severe degenerative changes at the right glenohumeral joint and previous left shoulder  replacement. There is concern for increased densities at the right lung base with right pleural fluid. Patchy densities in both lungs could represent mild edema. Atherosclerotic calcifications at the aortic arch. IMPRESSION: Slightly increased densities at the right lung base. Findings could represent consolidation/volume loss with right pleural fluid. Infectious etiology cannot be excluded. Question mild edema. Electronically Signed   By: Markus Daft M.D.   On: 01/23/2018 16:49   Dg Chest 2 View  Result Date: 01/12/2018 CLINICAL DATA:  Patient with worsening shortness of breath. EXAM: CHEST - 2 VIEW COMPARISON:  Chest radiograph 12/13/2017 FINDINGS: Multi lead pacer apparatus overlies the left hemithorax, leads are stable in position. Stable cardiomegaly. Tortuosity and calcification of the thoracic aorta. Coarse bilateral interstitial opacities. Heterogeneous opacities right lung base. Small bilateral pleural effusions. Thoracic spine degenerative changes. IMPRESSION: Cardiomegaly. Coarse bilateral interstitial opacities may represent mild edema. Heterogeneous opacities right lung base may represent atelectasis or infection. Electronically Signed   By: Lovey Newcomer M.D.   On: 01/12/2018 17:02   Ct Chest W Contrast  Result Date: 01/23/2018 CLINICAL DATA:  Chest pain, short of breath EXAM: CT CHEST WITH CONTRAST TECHNIQUE: Multidetector CT imaging of the chest was performed during intravenous contrast administration. CONTRAST:  66mL ISOVUE-300 IOPAMIDOL (ISOVUE-300) INJECTION 61% COMPARISON:  01/23/2018 radiograph FINDINGS: Cardiovascular: Nonaneurysmal aorta. Moderate aortic atherosclerosis. Coronary vascular calcification. Mild cardiomegaly. No large pericardial effusion. Partially visualized cardiac pacing leads. Mediastinum/Nodes: Midline trachea. No thyroid mass. Right low paratracheal lymph node measures 13 mm. Esophagus within normal limits. Lungs/Pleura: Mild emphysema. Moderate right pleural effusion.  Partial consolidation in the right lower lobe. No pneumothorax. Upper Abdomen: Calcified gallstones.  No acute abnormality Musculoskeletal: No acute or suspicious abnormality. Degenerative changes. IMPRESSION: 1. Moderate right pleural effusion with partial atelectasis or pneumonia  at the right base. 2. Cardiomegaly 3. Gallstones 4. Mild emphysema Aortic Atherosclerosis (ICD10-I70.0) and Emphysema (ICD10-J43.9). Electronically Signed   By: Donavan Foil M.D.   On: 01/23/2018 17:46   Dg Esophagus  Result Date: 01/27/2018 CLINICAL DATA:  Dysphagia starting this morning. EXAM: ESOPHOGRAM/BARIUM SWALLOW TECHNIQUE: Single contrast examination was performed using  thin barium. FLUOROSCOPY TIME:  Fluoroscopy Time:  2 minutes, 12 seconds Radiation Exposure Index (if provided by the fluoroscopic device): 12.4 mGy Number of Acquired Spot Images: 0 COMPARISON:  CT chest from 01/23/2018 FINDINGS: Primary peristaltic waves are disrupted in the upper to mid esophagus on all swallows. There some secondary and tertiary contractions along with minimal distal esophageal fold thickening. Primarily because of the apparent dysmotility, I was unable to distend the distal most esophagus beyond about 12 mm. There is a suggestion of a distal esophageal mucosal ring for example on image 9/9. I did administer a 13 mm in diameter barium tablet. This demonstrated stasis in the mid esophagus due to the dysmotility despite having the patient drink multiple swallows of water. Accordingly, this tablet did not test the suspected distal esophageal mucosal ring for patency/diameter. Dual lead pacer noted. IMPRESSION: 1. Nonspecific esophageal dysmotility disorder, with disruption of primary peristaltic waves in the proximal to midthoracic esophagus on all swallows. 2. There is a suggestion of a distal esophageal mucosal ring. 3. I was unable to distend the distal esophagus beyond about 12 mm, although I am not certain if this is because of the  dysmotility causing poor distension, or whether there is a true significant narrowing. No irregularity at the gastroesophageal junction aside from the suspected mucosal ring. Electronically Signed   By: Van Clines M.D.   On: 01/27/2018 13:46    Kathie Dike, MD  [facility] Pager [phone]  If 7PM-7AM, please contact night-coverage [email or web address] Password [email or web address] 01/28/2018, 7:30 PM   LOS: 2 days

## 2018-01-28 NOTE — Op Note (Addendum)
[facility] Patient Name: Shawn Frye Procedure Date: 01/28/2018 3:21 PM MRN: [id number] Date of Birth: [birth year] Attending MD: Hildred Laser , MD CSN: [id number] Age: 84 Admit Type: Inpatient Procedure:                Upper GI endoscopy Indications:              Esophageal dysphagia, Abnormal esophagram Providers:                Hildred Laser, MD, Lurline Del, RN, Randa Spike,                            Technician Referring MD:             Orson Eva, DO Medicines:                Lidocaine jelly, Midazolam 1.5 mg IV Complications:            No immediate complications. Estimated Blood Loss:     Estimated blood loss was minimal. Procedure:                Pre-Anesthesia Assessment:                           - Prior to the procedure, a History and Physical                            was performed, and patient medications and                            allergies were reviewed. The patient's tolerance of                            previous anesthesia was also reviewed. The risks                            and benefits of the procedure and the sedation                            options and risks were discussed with the patient.                            All questions were answered, and informed consent                            was obtained. Prior Anticoagulants: The patient                            last took Lovenox (enoxaparin) 1 day prior to the                            procedure. ASA Grade Assessment: IV - A patient                            with severe systemic disease that is a constant  threat to life. After reviewing the risks and                            benefits, the patient was deemed in satisfactory                            condition to undergo the procedure.                           After obtaining informed consent, the endoscope was                            passed under direct vision. Throughout the   procedure, the patient's blood pressure, pulse, and                            oxygen saturations were monitored continuously. The                            GIF-H190 ([id number]) scope was introduced through the                            mouth, and advanced to the second part of duodenum.                            The upper GI endoscopy was accomplished without                            difficulty. The patient tolerated the procedure                            well. Scope In: 3:47:58 PM Scope Out: 4:03:09 PM Total Procedure Duration: 0 hours 15 minutes 11 seconds  Findings:      The examined esophagus was normal.      One benign-appearing, intrinsic mild stenosis was found 39 cm from the       incisors. The stenosis was traversed. The dilation site was examined and       showed mild mucosal disruption and no perforation.      A 2 cm hiatal hernia was present.      Mild portal hypertensive gastropathy was found in the gastric fundus and       in the gastric body.      Localized moderate inflammation characterized by congestion (edema),       erythema and friability was found in the prepyloric region of the       stomach. Biopsies were taken with a cold forceps for histology.      The exam of the stomach was otherwise normal.      The duodenal bulb and second portion of the duodenum were normal. Impression:               - Normal esophagus.                           - Benign-appearing esophageal stenosis.                           -  2 cm hiatal hernia.                           - Portal hypertensive gastropathy.                           - Gastritis. Biopsied.                           Comment: Discrepency between EGD and GI study                            findings.                           He possibly has significant esophageal dysmotility.                           - Normal duodenal bulb and second portion of the                            duodenum. Moderate Sedation:       Moderate (conscious) sedation was administered by the endoscopy nurse       and supervised by the endoscopist. The following parameters were       monitored: oxygen saturation, heart rate, blood pressure, CO2       capnography and response to care. Total physician intraservice time was       21 minutes. Recommendation:           - Return patient to hospital ward for ongoing care.                           - Mechanical soft diet today.                           - Continue present medications.                           - Resume Lovenox (enoxaparin) at prior dose                            tomorrow.                           - Await pathology results. Procedure Code(s):        --- Professional ---                           [phone], Esophagogastroduodenoscopy, flexible,                            transoral; with biopsy, single or multiple                           G0500, Moderate sedation services provided by the                            same physician or other qualified  health care                            professional performing a gastrointestinal                            endoscopic service that sedation supports,                            requiring the presence of an independent trained                            observer to assist in the monitoring of the                            patient's level of consciousness and physiological                            status; initial 15 minutes of intra-service time;                            patient age 71 years or older (additional time may                            be reported with [phone], as appropriate) Diagnosis Code(s):        --- Professional ---                           K22.2, Esophageal obstruction                           K44.9, Diaphragmatic hernia without obstruction or                            gangrene                           K76.6, Portal hypertension                           K31.89, Other diseases of stomach and  duodenum                           K29.70, Gastritis, unspecified, without bleeding                           R13.14, Dysphagia, pharyngoesophageal phase                           R93.3, Abnormal findings on diagnostic imaging of                            other parts of digestive tract CPT copyright 2017 [employer]. All rights reserved. The codes documented in this report are preliminary and upon coder review may  be revised to meet current compliance requirements. Hildred Laser, MD Hildred Laser, MD 01/28/2018 4:33:35 PM This  report has been signed electronically. Number of Addenda: 0

## 2018-01-28 NOTE — Progress Notes (Signed)
Brief EGD note:  Noncritical narrowing at GE junction dilated with balloon dilated to 18 mm resulting in minimal focal mucosal disruption. Small sliding hiatal hernia. Mild portal gastropathy. Prepyloric gastritis with marked edema and erythema to folds.  Biopsy taken. Pyloric channel is patent. Normal exam of the bulb and second part of the duodenum.

## 2018-01-28 NOTE — Consult Note (Signed)
Reason for Consult: dysphagia Referring Physician:  [name] is an 84 y.o. male.  HPI:  Admitted yesterday with c/o SOB. No fever or chills.  Has had 2 recent admissions for pneumonia.   Patient also c/o dysphagia. States he is not able to eat much. Has lost about 25 pounds. Caregiver in room states he eats pretty well at home. He eats out frequently with his family. He has a caregiver 8 hrs a day.  Patient states foods seem to lodge in his upper esophagus. Underwent a DG esophagram yesterday which revealed 1. Nonspecific esophageal dysmotility disorder, with disruption of primary peristaltic waves in the proximal to midthoracic esophagus on all swallows. 2. There is a suggestion of a distal esophageal mucosal ring. 3. I was unable to distend the distal esophagus beyond about 12 mm, although I am not certain if this is because of the dysmotility causing poor distension, or whether there is a true significant narrowing. No irregularity at the gastroesophageal junction aside from the suspected mucosal ring. Seen in our office in June for dysphagia. Underwent an Esophagram which revealed IMPRESSION: 1. Abnormal barium esophagram a single episode of silent laryngeal penetration and aspiration with thick barium. 2. Retention of barium in the valleculae and piriform sinuses which cleared with repeat swallowing. 3. Otherwise normal barium esophagram. He was referred to Speech Pathology No change in his BM. No abdominal pain.    EGD/ED 2008 DR. Rehman FINAL DIAGNOSIS: Stricture at gastroesophageal junction with a small ulcer dilated with a balloon to 16.5 mm. Small sliding hiatal hernia. Gastroduodenitis.  Past Medical History:  Diagnosis Date  . AAA (abdominal aortic aneurysm) ([place])   . Arthritis   . Chronic combined systolic (congestive) and diastolic (congestive) heart failure ([place])    a. 10/2017: echo showing a reduced EF of 25%, diffuse HK with akinesis of the apical  myocardium, Grade 2 DD, mild AI, mild to moderate MR, and mild to moderate TR  . GERD (gastroesophageal reflux disease)   . Heart block AV second degree    St. Jude pacemaker May 2017 - Dr. [name]  . Infection of bladder   . Joint pain   . Symptomatic bradycardia     Past Surgical History:  Procedure Laterality Date  . BACK SURGERY    . CATARACT EXTRACTION W/ INTRAOCULAR LENS  IMPLANT, BILATERAL Bilateral   . EP IMPLANTABLE DEVICE N/A 10/02/2015   Procedure: Pacemaker Implant;  Surgeon: Evans Lance, MD;  Location: [facility];  Service: Cardiovascular;  Laterality: N/A;  . EXCISIONAL HEMORRHOIDECTOMY    . HEMORROIDECTOMY    . INGUINAL HERNIA REPAIR Bilateral 1991  . INSERT / REPLACE / REMOVE PACEMAKER    . JOINT REPLACEMENT    . [place] SURGERY  2005  . TOTAL KNEE ARTHROPLASTY Bilateral 1998 -  2003  . TOTAL SHOULDER REPLACEMENT Left 1999   Partial  . TYMPANOSTOMY TUBE PLACEMENT Right     Family History  Problem Relation Age of Onset  . Heart disease Father        NOT  before age 37  . Pneumonia Mother   . Cystic fibrosis Paternal Aunt   . Kidney disease Neg Hx     Social History:  reports that he quit smoking about 67 years ago. His smoking use included cigarettes. He has a 15.00 pack-year smoking history. He has never used smokeless tobacco. He reports that he does not drink alcohol or use drugs.  Allergies:  Allergies  Allergen Reactions  .  Percocet [Oxycodone-Acetaminophen] Hives  . Noroxin [Norfloxacin] Nausea And Vomiting  . Celebrex [Celecoxib] Rash    Medications: I have reviewed the patient's current medications.  Results for orders placed or performed during the hospital encounter of 01/23/18 (from the past 48 hour(s))  Basic metabolic panel     Status: Abnormal   Collection Time: 01/27/18  5:33 AM  Result Value Ref Range   Sodium 140 135 - 145 mmol/L   Potassium 3.4 (L) 3.5 - 5.1 mmol/L   Chloride 103 98 - 111 mmol/L   CO2 29 22 - 32  mmol/L   Glucose, Bld 97 70 - 99 mg/dL   BUN 24 (H) 8 - 23 mg/dL   Creatinine, Ser 0.88 0.61 - 1.24 mg/dL   Calcium 8.9 8.9 - 10.3 mg/dL   GFR calc non Af Amer >60 >60 mL/min   GFR calc Af Amer >60 >60 mL/min    Comment: (NOTE) The eGFR has been calculated using the CKD EPI equation. This calculation has not been validated in all clinical situations. eGFR's persistently <60 mL/min signify possible Chronic Kidney Disease.    Anion gap 8 5 - 15    Comment: Performed at [facility], [address], [place], [place] [address]  Basic metabolic panel     Status: Abnormal   Collection Time: 01/28/18  6:00 AM  Result Value Ref Range   Sodium 138 135 - 145 mmol/L   Potassium 3.4 (L) 3.5 - 5.1 mmol/L   Chloride 100 98 - 111 mmol/L   CO2 30 22 - 32 mmol/L   Glucose, Bld 93 70 - 99 mg/dL   BUN 21 8 - 23 mg/dL   Creatinine, Ser 0.91 0.61 - 1.24 mg/dL   Calcium 8.7 (L) 8.9 - 10.3 mg/dL   GFR calc non Af Amer >60 >60 mL/min   GFR calc Af Amer >60 >60 mL/min    Comment: (NOTE) The eGFR has been calculated using the CKD EPI equation. This calculation has not been validated in all clinical situations. eGFR's persistently <60 mL/min signify possible Chronic Kidney Disease.    Anion gap 8 5 - 15    Comment: Performed at [facility], [address] [place], [place] [address]    Dg Esophagus  Result Date: 01/27/2018 CLINICAL DATA:  Dysphagia starting this morning. EXAM: ESOPHOGRAM/BARIUM SWALLOW TECHNIQUE: Single contrast examination was performed using  thin barium. FLUOROSCOPY TIME:  Fluoroscopy Time:  2 minutes, 12 seconds Radiation Exposure Index (if provided by the fluoroscopic device): 12.4 mGy Number of Acquired Spot Images: 0 COMPARISON:  CT chest from 01/23/2018 FINDINGS: Primary peristaltic waves are disrupted in the upper to mid esophagus on all swallows. There some secondary and tertiary contractions along with minimal distal esophageal fold thickening. Primarily because of the  apparent dysmotility, I was unable to distend the distal most esophagus beyond about 12 mm. There is a suggestion of a distal esophageal mucosal ring for example on image 9/9. I did administer a 13 mm in diameter barium tablet. This demonstrated stasis in the mid esophagus due to the dysmotility despite having the patient drink multiple swallows of water. Accordingly, this tablet did not test the suspected distal esophageal mucosal ring for patency/diameter. Dual lead pacer noted. IMPRESSION: 1. Nonspecific esophageal dysmotility disorder, with disruption of primary peristaltic waves in the proximal to midthoracic esophagus on all swallows. 2. There is a suggestion of a distal esophageal mucosal ring. 3. I was unable to distend the distal esophagus beyond about 12 mm, although  I am not certain if this is because of the dysmotility causing poor distension, or whether there is a true significant narrowing. No irregularity at the gastroesophageal junction aside from the suspected mucosal ring. Electronically Signed   By: Van Clines M.D.   On: 01/27/2018 13:46    ROS Blood pressure 95/61, pulse 89, temperature 97.7 F (36.5 C), temperature source Oral, resp. rate 15, height _0  (1.702 m), weight 56.3 kg, SpO2 92 %. Physical Exam Alert and oriented. Skin warm and dry. Oral mucosa is moist.   . Sclera anicteric, conjunctivae is pink. Thyroid not enlarged. No cervical lymphadenopathy. Lungs clear. Heart regular rate and rhythm.  Abdomen is soft. Bowel sounds are positive. No hepatomegaly. No abdominal masses felt. No tenderness.  N Very hard of hearing.     Assessment/Plan: Dysphagia. Possible EGD/ED. Dr. Laural Golden is aware.   Wali Reinheimer L Renaye Janicki 01/28/2018, 8:42 AM

## 2018-01-29 ENCOUNTER — Encounter (HOSPITAL_COMMUNITY): Payer: Self-pay | Admitting: Internal Medicine

## 2018-01-29 ENCOUNTER — Telehealth: Payer: Self-pay | Admitting: Cardiology

## 2018-01-29 ENCOUNTER — Ambulatory Visit (INDEPENDENT_AMBULATORY_CARE_PROVIDER_SITE_OTHER): Payer: Medicare Other | Admitting: *Deleted

## 2018-01-29 DIAGNOSIS — I441 Atrioventricular block, second degree: Secondary | ICD-10-CM | POA: Diagnosis not present

## 2018-01-29 DIAGNOSIS — R001 Bradycardia, unspecified: Secondary | ICD-10-CM

## 2018-01-29 MED ORDER — FUROSEMIDE 40 MG PO TABS: 40.0000 mg | ORAL_TABLET | Freq: Two times a day (BID) | ORAL | 1 refills | Status: DC

## 2018-01-29 MED ORDER — PANTOPRAZOLE SODIUM 40 MG PO TBEC: 40.0000 mg | DELAYED_RELEASE_TABLET | Freq: Every day | ORAL | 1 refills | Status: AC

## 2018-01-29 NOTE — Care Management (Signed)
DC home today. [facility] rep, Tim Justis, made aware.

## 2018-01-29 NOTE — Progress Notes (Signed)
Patient is to be discharged home and in stable condition. Patient's IV and telemetry removed, WNL. Patient given discharge instructions and verbalized understanding. Patient to be escorted out by staff via wheelchair.  Burhan Barham P Dishmon, RN  

## 2018-01-29 NOTE — Discharge Summary (Signed)
Physician Discharge Summary  Shawn Frye HEN:[id number] DOB: Aug 26, 1918 DOA: 01/23/2018  PCP: [name], MD  Admit date: 01/23/2018 Discharge date: 01/29/2018  Admitted From: Home Disposition: Home  Recommendations for Outpatient Follow-up:  1. Follow up with PCP in 1-2 weeks 2. Please obtain BMP/CBC in one week 3. Dr. Olevia Perches office will inform patient of biopsy results from EGD. 4. Follow-up with cardiology in the next 1 to 2 weeks.  Consider initiation of ARB versus Entresto as an outpatient  Home Health: Home health RN, PT Equipment/Devices:  Discharge Condition: Stable CODE STATUS: DNR Diet recommendation: Heart healthy  Brief/Interim Summary: 84 year old male with several medical problems, presents to the hospital with complaints of shortness of breath and lower extremity edema.  Found to have decompensated CHF.  More recently, he was admitted to the hospital from 9/2-9/4 at which time he was treated for pneumonia.  Patient received intravenous diuretics and had good urine output.  Overall volume status resolved.  He was noted to have significant dysphagia.  Seen by GI and underwent EGD with esophageal dilatation.  Discharge Diagnoses:  Principal Problem:   Pleural effusion Active Problems:   Aneurysm of abdominal vessel (HCC)   Mobitz type 2 second degree atrioventricular block   Acute respiratory failure with hypoxia (HCC)   Dilated cardiomyopathy (HCC)   Pacemaker   Acute on chronic combined systolic and diastolic CHF (congestive heart failure) (HCC)   Chronic systolic CHF (congestive heart failure) (HCC)   Elevated troponin   Malnutrition of moderate degree   CHF (congestive heart failure) (HCC)   Pleural effusion on right   Dysphagia  Acute on chronic systolic and diastolic CHF -appears to have biventricular failure -accurate I/O--incomplete, in part to poor pt compliance -Continue to hold metoprolol since blood pressure remains in the 90s.  This can be  readdressed in the outpatient setting. -12/14/17 Echo--EF 20%, diffuse HK, grade 2 DD, mod to severe TR -Treated with IV lasix>>>po lasix on 01/27/18.  Renal function appears to be tolerating this -NEG 11.5 lbs since admission -Consider starting ARB versus Entresto as an outpatient if blood pressure tolerates  Acute respiratory failure with hypoxia  -due to pulmonary edema -weanedoxygen back to room air for saturation >92%   Dysphagia/Esophageal stricture -01/27/18 esophagram--suggestion of a distal esophageal mucosal ring; Nonspecific esophageal dysmotility disorder -GI consulted -EGD with results as below. He did undergo dilatation of distal esophagus, but suspect component of motility disorder.  Diet was advanced and he tolerated solid diet.  Biopsy samples were taken during EGD was to be followed up by GI.  It was noted that he had some gastritis and will be started on PPI.  Second Degree AVB -s/p [facility] 2017 -saw Dr. Cristopher Peru 12/29/17--PPM working normally  AAA - followed by Vascular Surgery. At 5.6 cm by imaging in 06/2017.  Moderate Malnutrition -continue Ensure  Elevated troponin -due to decompensated CHF -trend is flat -personally reviewed EKG--AV paced -no chest pain presently  Discharge Instructions  Discharge Instructions    Diet - low sodium heart healthy   Complete by:  As directed    Increase activity slowly   Complete by:  As directed      Allergies as of 01/29/2018      Reactions   Percocet [oxycodone-acetaminophen] Hives   Noroxin [norfloxacin] Nausea And Vomiting   Celebrex [celecoxib] Rash      Medication List    STOP taking these medications   amoxicillin-clavulanate 875-125 MG tablet Commonly known as:  AUGMENTIN  metoprolol succinate 25 MG 24 hr tablet Commonly known as:  TOPROL-XL     TAKE these medications   ALPRAZolam 0.25 MG tablet Commonly known as:  XANAX Take 0.125-0.25 mg by mouth daily as needed for anxiety.    benzonatate 100 MG capsule Commonly known as:  TESSALON Take 1 capsule (100 mg total) by mouth 3 (three) times daily as needed for cough.   feeding supplement (ENSURE ENLIVE) Liqd Take 237 mLs by mouth 2 (two) times daily between meals.   ferrous sulfate 325 (65 FE) MG EC tablet Take 1 tablet by mouth daily.   furosemide 40 MG tablet Commonly known as:  LASIX Take 1 tablet (40 mg total) by mouth 2 (two) times daily. What changed:  when to take this   guaiFENesin 600 MG 12 hr tablet Commonly known as:  MUCINEX Take 1 tablet (600 mg total) by mouth 2 (two) times daily.   loratadine 10 MG tablet Commonly known as:  CLARITIN Take 10 mg by mouth daily.   pantoprazole 40 MG tablet Commonly known as:  PROTONIX Take 1 tablet (40 mg total) by mouth daily.   potassium chloride SA 20 MEQ tablet Commonly known as:  K-DUR,KLOR-CON Take 2 tablets (40 mEq total) by mouth daily.       Allergies  Allergen Reactions  . Percocet [Oxycodone-Acetaminophen] Hives  . Noroxin [Norfloxacin] Nausea And Vomiting  . Celebrex [Celecoxib] Rash    Consultations:  Gastroenterology   Procedures/Studies: Dg Chest 2 View  Result Date: 01/23/2018 CLINICAL DATA:  History of pneumonia and continued shortness of breath. EXAM: CHEST - 2 VIEW COMPARISON:  01/12/2018 FINDINGS: Stable cardiomegaly. Stable appearance of the left dual chamber cardiac pacemaker. Severe degenerative changes at the right glenohumeral joint and previous left shoulder replacement. There is concern for increased densities at the right lung base with right pleural fluid. Patchy densities in both lungs could represent mild edema. Atherosclerotic calcifications at the aortic arch. IMPRESSION: Slightly increased densities at the right lung base. Findings could represent consolidation/volume loss with right pleural fluid. Infectious etiology cannot be excluded. Question mild edema. Electronically Signed   By: Markus Daft M.D.   On:  01/23/2018 16:49   Dg Chest 2 View  Result Date: 01/12/2018 CLINICAL DATA:  Patient with worsening shortness of breath. EXAM: CHEST - 2 VIEW COMPARISON:  Chest radiograph 12/13/2017 FINDINGS: Multi lead pacer apparatus overlies the left hemithorax, leads are stable in position. Stable cardiomegaly. Tortuosity and calcification of the thoracic aorta. Coarse bilateral interstitial opacities. Heterogeneous opacities right lung base. Small bilateral pleural effusions. Thoracic spine degenerative changes. IMPRESSION: Cardiomegaly. Coarse bilateral interstitial opacities may represent mild edema. Heterogeneous opacities right lung base may represent atelectasis or infection. Electronically Signed   By: Lovey Newcomer M.D.   On: 01/12/2018 17:02   Ct Chest W Contrast  Result Date: 01/23/2018 CLINICAL DATA:  Chest pain, short of breath EXAM: CT CHEST WITH CONTRAST TECHNIQUE: Multidetector CT imaging of the chest was performed during intravenous contrast administration. CONTRAST:  40mL ISOVUE-300 IOPAMIDOL (ISOVUE-300) INJECTION 61% COMPARISON:  01/23/2018 radiograph FINDINGS: Cardiovascular: Nonaneurysmal aorta. Moderate aortic atherosclerosis. Coronary vascular calcification. Mild cardiomegaly. No large pericardial effusion. Partially visualized cardiac pacing leads. Mediastinum/Nodes: Midline trachea. No thyroid mass. Right low paratracheal lymph node measures 13 mm. Esophagus within normal limits. Lungs/Pleura: Mild emphysema. Moderate right pleural effusion. Partial consolidation in the right lower lobe. No pneumothorax. Upper Abdomen: Calcified gallstones.  No acute abnormality Musculoskeletal: No acute or suspicious abnormality. Degenerative changes. IMPRESSION: 1. Moderate  right pleural effusion with partial atelectasis or pneumonia at the right base. 2. Cardiomegaly 3. Gallstones 4. Mild emphysema Aortic Atherosclerosis (ICD10-I70.0) and Emphysema (ICD10-J43.9). Electronically Signed   By: Donavan Foil M.D.    On: 01/23/2018 17:46   Dg Esophagus  Result Date: 01/27/2018 CLINICAL DATA:  Dysphagia starting this morning. EXAM: ESOPHOGRAM/BARIUM SWALLOW TECHNIQUE: Single contrast examination was performed using  thin barium. FLUOROSCOPY TIME:  Fluoroscopy Time:  2 minutes, 12 seconds Radiation Exposure Index (if provided by the fluoroscopic device): 12.4 mGy Number of Acquired Spot Images: 0 COMPARISON:  CT chest from 01/23/2018 FINDINGS: Primary peristaltic waves are disrupted in the upper to mid esophagus on all swallows. There some secondary and tertiary contractions along with minimal distal esophageal fold thickening. Primarily because of the apparent dysmotility, I was unable to distend the distal most esophagus beyond about 12 mm. There is a suggestion of a distal esophageal mucosal ring for example on image 9/9. I did administer a 13 mm in diameter barium tablet. This demonstrated stasis in the mid esophagus due to the dysmotility despite having the patient drink multiple swallows of water. Accordingly, this tablet did not test the suspected distal esophageal mucosal ring for patency/diameter. Dual lead pacer noted. IMPRESSION: 1. Nonspecific esophageal dysmotility disorder, with disruption of primary peristaltic waves in the proximal to midthoracic esophagus on all swallows. 2. There is a suggestion of a distal esophageal mucosal ring. 3. I was unable to distend the distal esophagus beyond about 12 mm, although I am not certain if this is because of the dysmotility causing poor distension, or whether there is a true significant narrowing. No irregularity at the gastroesophageal junction aside from the suspected mucosal ring. Electronically Signed   By: Van Clines M.D.   On: 01/27/2018 13:46      Subjective: Feeling better.  No shortness of breath.  Tolerating soft foods.  Discharge Exam: Vitals:   01/28/18 1642 01/28/18 2150 01/29/18 0613 01/29/18 0615  BP: 120/72 96/62  (!) 92/59  Pulse: 93  (!) 49 93 93  Resp: 18 15  15   Temp: 98.3 F (36.8 C) 97.8 F (36.6 C)    TempSrc: Oral Oral    SpO2: 94% 96% 92% 92%  Weight:    58.1 kg  Height:        General: Pt is alert, awake, not in acute distress Cardiovascular: RRR, S1/S2 +, no rubs, no gallops Respiratory: CTA bilaterally, no wheezing, no rhonchi Abdominal: Soft, NT, ND, bowel sounds + Extremities: no edema, no cyanosis    The results of significant diagnostics from this hospitalization (including imaging, microbiology, ancillary and laboratory) are listed below for reference.     Microbiology: Recent Results (from the past 240 hour(s))  MRSA PCR Screening     Status: None   Collection Time: 01/23/18  9:50 PM  Result Value Ref Range Status   MRSA by PCR NEGATIVE NEGATIVE Final    Comment:        The GeneXpert MRSA Assay (FDA approved for NASAL specimens only), is one component of a comprehensive MRSA colonization surveillance program. It is not intended to diagnose MRSA infection nor to guide or monitor treatment for MRSA infections. Performed at [facility], [address] [place], [place] [address]      Labs: BNP (last 3 results) Recent Labs    12/13/17 1519 01/12/18 1813 01/23/18 1540  BNP 1,751.0* 3,053.0* 6,283.6*   Basic Metabolic Panel: Recent Labs  Lab 01/24/18 0424 01/25/18 0615 01/26/18 0457 01/27/18  0533 01/28/18 0600  NA 139 138 140 140 138  K 4.3 3.1* 3.1* 3.4* 3.4*  CL 109 103 101 103 100  CO2 24 29 29 29 30   GLUCOSE 93 99 104* 97 93  BUN 15 17 18  24* 21  CREATININE 0.80 0.83 0.91 0.88 0.91  CALCIUM 8.4* 8.3* 8.8* 8.9 8.7*  MG 2.3 2.0 2.0  --   --    Liver Function Tests: Recent Labs  Lab 01/23/18 1540  AST 20  ALT 11  ALKPHOS 94  BILITOT 0.9  PROT 7.8  ALBUMIN 3.1*   No results for input(s): LIPASE, AMYLASE in the last 168 hours. No results for input(s): AMMONIA in the last 168 hours. CBC: Recent Labs  Lab 01/23/18 1540 01/24/18 0424  WBC 9.0 6.8   NEUTROABS 6.4  --   HGB 10.6* 9.8*  HCT 33.1* 31.2*  MCV 85.3 84.6  PLT 222 197   Cardiac Enzymes: Recent Labs  Lab 01/23/18 1540 01/23/18 1858  TROPONINI 0.03* 0.03*   BNP: Invalid input(s): POCBNP CBG: No results for input(s): GLUCAP in the last 168 hours. D-Dimer No results for input(s): DDIMER in the last 72 hours. Hgb A1c No results for input(s): HGBA1C in the last 72 hours. Lipid Profile No results for input(s): CHOL, HDL, LDLCALC, TRIG, CHOLHDL, LDLDIRECT in the last 72 hours. Thyroid function studies No results for input(s): TSH, T4TOTAL, T3FREE, THYROIDAB in the last 72 hours.  Invalid input(s): FREET3 Anemia work up No results for input(s): VITAMINB12, FOLATE, FERRITIN, TIBC, IRON, RETICCTPCT in the last 72 hours. Urinalysis    Component Value Date/Time   COLORURINE STRAW (A) 01/23/2018 1732   APPEARANCEUR CLEAR 01/23/2018 1732   LABSPEC 1.008 01/23/2018 1732   PHURINE 6.0 01/23/2018 1732   GLUCOSEU NEGATIVE 01/23/2018 1732   HGBUR NEGATIVE 01/23/2018 1732   BILIRUBINUR NEGATIVE 01/23/2018 1732   KETONESUR NEGATIVE 01/23/2018 1732   PROTEINUR NEGATIVE 01/23/2018 1732   NITRITE NEGATIVE 01/23/2018 1732   LEUKOCYTESUR TRACE (A) 01/23/2018 1732   Sepsis Labs Invalid input(s): PROCALCITONIN,  WBC,  LACTICIDVEN Microbiology Recent Results (from the past 240 hour(s))  MRSA PCR Screening     Status: None   Collection Time: 01/23/18  9:50 PM  Result Value Ref Range Status   MRSA by PCR NEGATIVE NEGATIVE Final    Comment:        The GeneXpert MRSA Assay (FDA approved for NASAL specimens only), is one component of a comprehensive MRSA colonization surveillance program. It is not intended to diagnose MRSA infection nor to guide or monitor treatment for MRSA infections. Performed at [facility], [address] [place], [place] [address]      Time coordinating discharge: 55mins  SIGNED:   Kathie Dike, MD  [facility] 01/29/2018, 1:37  PM Pager   If 7PM-7AM, please contact night-coverage [email or web address] Password [email or web address]

## 2018-01-29 NOTE — Care Management Note (Signed)
Case Management Note  Patient Details  Name: Shawn Frye MRN: [id number] Date of Birth: 1918-08-04  If discussed at Long Length of Stay Meetings, dates discussed:  01/29/18  Additional Comments:  Sherald Barge, RN 01/29/2018, 1:47 PM

## 2018-01-29 NOTE — Telephone Encounter (Signed)
LMOVM reminding pt to send remote transmission.   

## 2018-01-30 NOTE — Progress Notes (Signed)
Remote pacemaker transmission.   

## 2018-01-31 DIAGNOSIS — Z9181 History of falling: Secondary | ICD-10-CM | POA: Diagnosis not present

## 2018-01-31 DIAGNOSIS — I441 Atrioventricular block, second degree: Secondary | ICD-10-CM | POA: Diagnosis not present

## 2018-01-31 DIAGNOSIS — R1310 Dysphagia, unspecified: Secondary | ICD-10-CM | POA: Diagnosis not present

## 2018-01-31 DIAGNOSIS — Z8701 Personal history of pneumonia (recurrent): Secondary | ICD-10-CM | POA: Diagnosis not present

## 2018-01-31 DIAGNOSIS — H9193 Unspecified hearing loss, bilateral: Secondary | ICD-10-CM | POA: Diagnosis not present

## 2018-01-31 DIAGNOSIS — D509 Iron deficiency anemia, unspecified: Secondary | ICD-10-CM | POA: Diagnosis not present

## 2018-01-31 DIAGNOSIS — Z9981 Dependence on supplemental oxygen: Secondary | ICD-10-CM | POA: Diagnosis not present

## 2018-01-31 DIAGNOSIS — Z96612 Presence of left artificial shoulder joint: Secondary | ICD-10-CM | POA: Diagnosis not present

## 2018-01-31 DIAGNOSIS — E440 Moderate protein-calorie malnutrition: Secondary | ICD-10-CM | POA: Diagnosis not present

## 2018-01-31 DIAGNOSIS — I420 Dilated cardiomyopathy: Secondary | ICD-10-CM | POA: Diagnosis not present

## 2018-01-31 DIAGNOSIS — M1991 Primary osteoarthritis, unspecified site: Secondary | ICD-10-CM | POA: Diagnosis not present

## 2018-01-31 DIAGNOSIS — Z950 Presence of cardiac pacemaker: Secondary | ICD-10-CM | POA: Diagnosis not present

## 2018-01-31 DIAGNOSIS — I714 Abdominal aortic aneurysm, without rupture: Secondary | ICD-10-CM | POA: Diagnosis not present

## 2018-01-31 DIAGNOSIS — Z96653 Presence of artificial knee joint, bilateral: Secondary | ICD-10-CM | POA: Diagnosis not present

## 2018-01-31 DIAGNOSIS — I5043 Acute on chronic combined systolic (congestive) and diastolic (congestive) heart failure: Secondary | ICD-10-CM | POA: Diagnosis not present

## 2018-01-31 DIAGNOSIS — K222 Esophageal obstruction: Secondary | ICD-10-CM | POA: Diagnosis not present

## 2018-01-31 DIAGNOSIS — I5040 Unspecified combined systolic (congestive) and diastolic (congestive) heart failure: Secondary | ICD-10-CM | POA: Diagnosis not present

## 2018-02-02 ENCOUNTER — Telehealth: Payer: Self-pay | Admitting: Cardiology

## 2018-02-02 ENCOUNTER — Encounter: Payer: Self-pay | Admitting: Cardiology

## 2018-02-02 ENCOUNTER — Ambulatory Visit (INDEPENDENT_AMBULATORY_CARE_PROVIDER_SITE_OTHER): Payer: Medicare Other | Admitting: Cardiology

## 2018-02-02 VITALS — BP 100/60 | HR 91 | Ht 67.0 in | Wt 129.0 lb

## 2018-02-02 DIAGNOSIS — I5042 Chronic combined systolic (congestive) and diastolic (congestive) heart failure: Secondary | ICD-10-CM

## 2018-02-02 DIAGNOSIS — D509 Iron deficiency anemia, unspecified: Secondary | ICD-10-CM | POA: Diagnosis not present

## 2018-02-02 DIAGNOSIS — I441 Atrioventricular block, second degree: Secondary | ICD-10-CM | POA: Diagnosis not present

## 2018-02-02 DIAGNOSIS — I420 Dilated cardiomyopathy: Secondary | ICD-10-CM | POA: Diagnosis not present

## 2018-02-02 DIAGNOSIS — I714 Abdominal aortic aneurysm, without rupture, unspecified: Secondary | ICD-10-CM

## 2018-02-02 DIAGNOSIS — I5043 Acute on chronic combined systolic (congestive) and diastolic (congestive) heart failure: Secondary | ICD-10-CM | POA: Diagnosis not present

## 2018-02-02 DIAGNOSIS — M1991 Primary osteoarthritis, unspecified site: Secondary | ICD-10-CM | POA: Diagnosis not present

## 2018-02-02 DIAGNOSIS — J90 Pleural effusion, not elsewhere classified: Secondary | ICD-10-CM | POA: Diagnosis not present

## 2018-02-02 NOTE — Telephone Encounter (Signed)
Spoke with Estill Bamberg from [facility]. She is requesting CHF nursing orders to have pt seen 2 times a week for 3 weeks, then 1 time a week for 5 weeks and then 2 PRN visits.

## 2018-02-02 NOTE — Progress Notes (Signed)
Cardiology Office Note  Date: 02/02/2018   ID: Shawn Frye, DOB 02-15-1919, MRN [id number]  PCP: [name], MD  Primary Cardiologist: Rozann Lesches, MD   Chief Complaint  Patient presents with  . Cardiomyopathy     History of Present Illness: Shawn Frye is a 84 y.o. male last seen by Ms. Strader PA-C in June. Since I last saw him he was placed on low-dose Toprol-XL and low-dose lisinopril.  Follow-up lab work showed creatinine 0.91.  LVEF was approximately 20% by echocardiogram in August.  Unfortunately, he is continued to have bouts of symptomatic systolic heart failure resulting in hospitalization.  In fact, he was just recently discharged from the hospitalist service on September 19.  He has run low blood pressures which limit medical therapy, now is off Toprol-XL, not able to start ARB or Entresto, and is taking only Lasix with potassium supplements.  He has previously declined invasive cardiac evaluation, certainly not unreasonable at age 68 and with DNR status.  He is here today with family members for follow-up.  Home health services are in place.  I talked with him about following daily weights and vital signs, bringing these in to his next visit.  He complained about fluid restriction, also has not wanted to take iron supplements due to constipation.  His weight has been stable since hospital discharge on Lasix 40 mg twice daily and potassium supplements.  I asked him to make sure that he is under 2000 cc fluid intake in 24 hours.  Chest CT in September showed a moderate size right pleural effusion.  He continues to follow in the device clinic with Dr. [name], [place] pacemaker in place with history of symptomatic type 2 heart block.  Past Medical History:  Diagnosis Date  . AAA (abdominal aortic aneurysm) ([place])   . Arthritis   . Chronic combined systolic (congestive) and diastolic (congestive) heart failure ([place])    a. 10/2017: echo showing a reduced EF of 25%,  diffuse HK with akinesis of the apical myocardium, Grade 2 DD, mild AI, mild to moderate MR, and mild to moderate TR  . GERD (gastroesophageal reflux disease)   . Heart block AV second degree    [employer] pacemaker May 2017 - Dr. [name]  . Infection of bladder   . Joint pain   . Symptomatic bradycardia     Past Surgical History:  Procedure Laterality Date  . BACK SURGERY    . BIOPSY  01/28/2018   Procedure: BIOPSY;  Surgeon: Rogene Houston, MD;  [facility];  Service: Endoscopy;;  pre-pyloric  . CATARACT EXTRACTION W/ INTRAOCULAR LENS  IMPLANT, BILATERAL Bilateral   . EP IMPLANTABLE DEVICE N/A 10/02/2015   Procedure: Pacemaker Implant;  Surgeon: Evans Lance, MD;  Location: [facility];  Service: Cardiovascular;  Laterality: N/A;  . ESOPHAGOGASTRODUODENOSCOPY N/A 01/28/2018   Procedure: ESOPHAGOGASTRODUODENOSCOPY (EGD);  Surgeon: Rogene Houston, MD;  Location[facility];  Service: Endoscopy;  Laterality: N/A;  . EXCISIONAL HEMORRHOIDECTOMY    . HEMORROIDECTOMY    . INGUINAL HERNIA REPAIR Bilateral 1991  . INSERT / REPLACE / REMOVE PACEMAKER    . JOINT REPLACEMENT    . [place] SURGERY  2005  . TOTAL KNEE ARTHROPLASTY Bilateral 1998 -  2003  . TOTAL SHOULDER REPLACEMENT Left 1999   Partial  . TYMPANOSTOMY TUBE PLACEMENT Right     Current Outpatient Medications  Medication Sig Dispense Refill  . ALPRAZolam Duanne Moron)  0.25 MG tablet Take 0.125-0.25 mg by mouth daily as needed for anxiety.    . benzonatate (TESSALON PERLES) 100 MG capsule Take 1 capsule (100 mg total) by mouth 3 (three) times daily as needed for cough. 30 capsule 0  . feeding supplement, ENSURE ENLIVE, (ENSURE ENLIVE) LIQD Take 237 mLs by mouth 2 (two) times daily between meals.    . ferrous sulfate 325 (65 FE) MG EC tablet Take 1 tablet by mouth daily.  1  . furosemide (LASIX) 40 MG tablet Take 1 tablet (40 mg total) by mouth 2 (two) times daily. 60 tablet 1  . guaiFENesin (MUCINEX) 600 MG  12 hr tablet Take 1 tablet (600 mg total) by mouth 2 (two) times daily. 20 tablet 0  . loratadine (CLARITIN) 10 MG tablet Take 10 mg by mouth daily.    . pantoprazole (PROTONIX) 40 MG tablet Take 1 tablet (40 mg total) by mouth daily. 30 tablet 1  . potassium chloride SA (K-DUR,KLOR-CON) 20 MEQ tablet Take 2 tablets (40 mEq total) by mouth daily.     No current facility-administered medications for this visit.    Allergies:  Percocet [oxycodone-acetaminophen]; Noroxin [norfloxacin]; and Celebrex [celecoxib]   Social History: The patient  reports that he quit smoking about 67 years ago. His smoking use included cigarettes. He has a 15.00 pack-year smoking history. He has never used smokeless tobacco. He reports that he does not drink alcohol or use drugs.   ROS:  Please see the history of present illness. Otherwise, complete review of systems is positive for hearing loss, constipation, chronic dyspnea on exertion and fatigue.  All other systems are reviewed and negative.   Physical Exam: VS:  BP 100/60 (BP Location: Right Arm)   Pulse 91   Ht 5\' 7"  (1.702 m)   Wt 129 lb (58.5 kg)   SpO2 94%   BMI 20.20 kg/m , BMI Body mass index is 20.2 kg/m.  Wt Readings from Last 3 Encounters:  02/02/18 129 lb (58.5 kg)  01/29/18 128 lb 1.4 oz (58.1 kg)  01/12/18 144 lb 13.5 oz (65.7 kg)    General: Frail-appearing elderly male using a walker. HEENT: Conjunctiva and lids normal, oropharynx clear. Neck: Supple, no elevated JVP or carotid bruits, no thyromegaly. Lungs: Decreased breath sounds at the bases, particularly on the right, nonlabored breathing at rest. Cardiac: Indistinct PMI, RRR, soft S3, no pericardial rub. Abdomen: Soft, nontender, bowel sounds present. Extremities: No pitting edema, distal pulses 2+. Skin: Warm and dry. Musculoskeletal: Kyphosis present. Neuropsychiatric: Alert and oriented x3, affect grossly appropriate.  ECG: I personally reviewed the tracing from 01/23/2018  which showed dual-chamber pacing.  Recent Labwork: 01/23/2018: ALT 11; AST 20; B Natriuretic Peptide 2,118.0 01/24/2018: Hemoglobin 9.8; Platelets 197 01/26/2018: Magnesium 2.0 01/28/2018: BUN 21; Creatinine, Ser 0.91; Potassium 3.4; Sodium 138   Other Studies Reviewed Today:  Echocardiogram 12/14/2017: Study Conclusions  - Left ventricle: The cavity size was mildly dilated. Wall   thickness was normal. Systolic function was severely reduced. The   estimated ejection fraction was 20%. Diffuse hypokinesis.   Features are consistent with a pseudonormal left ventricular   filling pattern, with concomitant abnormal relaxation and   increased filling pressure (grade 2 diastolic dysfunction).   Doppler parameters are consistent with high ventricular filling   pressure. - Regional wall motion abnormality: Akinesis of the apical   myocardium. - Ventricular septum: Septal motion showed abnormal function and   dyssynergy. - Aortic valve: Moderately calcified annulus. Trileaflet. There was  mild regurgitation. - Aorta: Mild aortic root enlargement. - Mitral valve: Calcified annulus. There was moderate   regurgitation. - Left atrium: The atrium was severely dilated. - Right ventricle: Pacer wire or catheter noted in right ventricle.   Systolic function was mildly to moderately reduced. - Right atrium: The atrium was moderately dilated. - Tricuspid valve: There was moderate-severe regurgitation. - Pulmonic valve: There was mild regurgitation. - Pulmonary arteries: PA peak pressure: 56 mm Hg (S). - Inferior vena cava: The vessel was dilated. The respirophasic   diameter changes were blunted (< 50%), consistent with elevated   central venous pressure.  Assessment and Plan:  1.  Chronic combined heart failure with LVEF approximately 20% and diffuse hypokinesis as well as apical akinesis.  As noted above he has declined invasive cardiac work-up which is reasonable at his age.  Unfortunately,  medical therapy is also limited by relatively low blood pressure.  Most recently he was taken off both Toprol-XL and was not able to tolerate low-dose lisinopril.  For now would continue diuretics with potassium supplements.  Follow daily weights with vital signs per home health.  We discussed appropriate fluid restriction guidelines.  Might be able to consider medication adjustments depending on trend.  He remains at high risk for recurrent heart failure symptoms however, would consider palliative care involvement if he is rehospitalized.  2.  History of second-degree heart block, status post [facility] pacemaker.  He continues to follow with Dr. [name].  3.  Abdominal aortic aneurysm, 5.6 cm as of imaging in February.  No abdominal pain.  4.  Right pleural effusion complicating systolic heart failure.  Therapeutic thoracentesis could always be considered if this progresses despite medical therapy.  Current medicines were reviewed with the patient today.  Disposition: Follow-up in the next 3 to 4 weeks.  Signed, Satira Sark, MD, [facility] 02/02/2018 4:01 PM    [facility] [address] [place], [place] [address] Phone: [phone]; Fax: [phone]

## 2018-02-02 NOTE — Patient Instructions (Addendum)
Your physician wants you to follow-up in:  3-4 weeks  Your physician recommends that you continue on your current medications as directed. Please refer to the Current Medication list given to you today.     If you need a refill on your cardiac medications before your next appointment, please call your pharmacy.     No la work or testing ordered today.       Thank you for choosing [facility] !

## 2018-02-02 NOTE — Telephone Encounter (Signed)
Left message on voicemail requesting home health orders on this patient.  Please call Estill Bamberg w/ [facility] for details. / tg

## 2018-02-03 NOTE — Telephone Encounter (Signed)
What are the current orders that they have in place?  I did not set this up initially.

## 2018-02-03 NOTE — Telephone Encounter (Signed)
I spoke with Estill Bamberg and they will teach home care provider how to obtain VS.They will inform us of any changes

## 2018-02-03 NOTE — Telephone Encounter (Signed)
I did not set up this service, it must have been requested by the hospitalist team after his recent hospital stay.  My main concern is that he have daily weights and vital signs recorded so that we can make reasonable adjustments in his medications.

## 2018-02-03 NOTE — Telephone Encounter (Signed)
LM with Estill Bamberg, HHN to call back-cc

## 2018-02-03 NOTE — Telephone Encounter (Signed)
[facility]N said there are no CHF orders for patient currently.They did an in home initial assessment.She thought patient was to be part of the "red vest" program where they measure fluid levels in lungs but there is nothing documented regarding that either.

## 2018-02-04 ENCOUNTER — Other Ambulatory Visit: Payer: Self-pay

## 2018-02-04 ENCOUNTER — Ambulatory Visit: Payer: Self-pay | Admitting: *Deleted

## 2018-02-04 NOTE — Patient Outreach (Signed)
[facility]) Care Management  02/04/2018  Shawn Frye [facility] [birth year] [id number]   Outreach call placed to Shawn Frye. Unable to reach member or caregiver. Left HIPAA compliant voice message requesting a return call.   PLAN Will follow up in 3-4 business days.   [place] [phone]

## 2018-02-05 DIAGNOSIS — J9600 Acute respiratory failure, unspecified whether with hypoxia or hypercapnia: Secondary | ICD-10-CM | POA: Diagnosis not present

## 2018-02-05 DIAGNOSIS — E43 Unspecified severe protein-calorie malnutrition: Secondary | ICD-10-CM | POA: Diagnosis not present

## 2018-02-05 DIAGNOSIS — D649 Anemia, unspecified: Secondary | ICD-10-CM | POA: Diagnosis not present

## 2018-02-05 DIAGNOSIS — I442 Atrioventricular block, complete: Secondary | ICD-10-CM | POA: Diagnosis not present

## 2018-02-05 DIAGNOSIS — I5043 Acute on chronic combined systolic (congestive) and diastolic (congestive) heart failure: Secondary | ICD-10-CM | POA: Diagnosis not present

## 2018-02-05 DIAGNOSIS — Z681 Body mass index (BMI) 19 or less, adult: Secondary | ICD-10-CM | POA: Diagnosis not present

## 2018-02-05 DIAGNOSIS — D509 Iron deficiency anemia, unspecified: Secondary | ICD-10-CM | POA: Diagnosis not present

## 2018-02-05 DIAGNOSIS — I441 Atrioventricular block, second degree: Secondary | ICD-10-CM | POA: Diagnosis not present

## 2018-02-05 DIAGNOSIS — M1991 Primary osteoarthritis, unspecified site: Secondary | ICD-10-CM | POA: Diagnosis not present

## 2018-02-05 DIAGNOSIS — E876 Hypokalemia: Secondary | ICD-10-CM | POA: Diagnosis not present

## 2018-02-05 DIAGNOSIS — I499 Cardiac arrhythmia, unspecified: Secondary | ICD-10-CM | POA: Diagnosis not present

## 2018-02-05 DIAGNOSIS — I714 Abdominal aortic aneurysm, without rupture: Secondary | ICD-10-CM | POA: Diagnosis not present

## 2018-02-05 DIAGNOSIS — J449 Chronic obstructive pulmonary disease, unspecified: Secondary | ICD-10-CM | POA: Diagnosis not present

## 2018-02-05 DIAGNOSIS — I420 Dilated cardiomyopathy: Secondary | ICD-10-CM | POA: Diagnosis not present

## 2018-02-05 DIAGNOSIS — F419 Anxiety disorder, unspecified: Secondary | ICD-10-CM | POA: Diagnosis not present

## 2018-02-05 DIAGNOSIS — I5042 Chronic combined systolic (congestive) and diastolic (congestive) heart failure: Secondary | ICD-10-CM | POA: Diagnosis not present

## 2018-02-05 DIAGNOSIS — R1310 Dysphagia, unspecified: Secondary | ICD-10-CM | POA: Diagnosis not present

## 2018-02-05 DIAGNOSIS — I5033 Acute on chronic diastolic (congestive) heart failure: Secondary | ICD-10-CM | POA: Diagnosis not present

## 2018-02-05 DIAGNOSIS — K222 Esophageal obstruction: Secondary | ICD-10-CM | POA: Diagnosis not present

## 2018-02-07 DIAGNOSIS — I420 Dilated cardiomyopathy: Secondary | ICD-10-CM | POA: Diagnosis not present

## 2018-02-07 DIAGNOSIS — D509 Iron deficiency anemia, unspecified: Secondary | ICD-10-CM | POA: Diagnosis not present

## 2018-02-07 DIAGNOSIS — I441 Atrioventricular block, second degree: Secondary | ICD-10-CM | POA: Diagnosis not present

## 2018-02-07 DIAGNOSIS — I5043 Acute on chronic combined systolic (congestive) and diastolic (congestive) heart failure: Secondary | ICD-10-CM | POA: Diagnosis not present

## 2018-02-07 DIAGNOSIS — M1991 Primary osteoarthritis, unspecified site: Secondary | ICD-10-CM | POA: Diagnosis not present

## 2018-02-07 DIAGNOSIS — I714 Abdominal aortic aneurysm, without rupture: Secondary | ICD-10-CM | POA: Diagnosis not present

## 2018-02-10 ENCOUNTER — Other Ambulatory Visit: Payer: Self-pay

## 2018-02-10 DIAGNOSIS — M1991 Primary osteoarthritis, unspecified site: Secondary | ICD-10-CM | POA: Diagnosis not present

## 2018-02-10 DIAGNOSIS — I420 Dilated cardiomyopathy: Secondary | ICD-10-CM | POA: Diagnosis not present

## 2018-02-10 DIAGNOSIS — I5043 Acute on chronic combined systolic (congestive) and diastolic (congestive) heart failure: Secondary | ICD-10-CM | POA: Diagnosis not present

## 2018-02-10 DIAGNOSIS — I714 Abdominal aortic aneurysm, without rupture: Secondary | ICD-10-CM | POA: Diagnosis not present

## 2018-02-10 DIAGNOSIS — D509 Iron deficiency anemia, unspecified: Secondary | ICD-10-CM | POA: Diagnosis not present

## 2018-02-10 DIAGNOSIS — I441 Atrioventricular block, second degree: Secondary | ICD-10-CM | POA: Diagnosis not present

## 2018-02-10 NOTE — Patient Outreach (Signed)
[facility]) Care Management  02/10/2018  Nicolo Tomko [facility] [birth year] [phone]   RNCM attempted outreach. No answer. Phone rang repeatedly without option to leave a voice message.   PLAN Will follow up in 3-4 business days.   [place] [phone]

## 2018-02-10 NOTE — Patient Outreach (Signed)
Duplicate Encounter: This encounter was created in error - please disregard.

## 2018-02-11 LAB — CUP PACEART REMOTE DEVICE CHECK
Brady Statistic AP VS Percent: 1 %
Brady Statistic AS VP Percent: 93 %
Implantable Lead Implant Date: 20170522
Implantable Pulse Generator Implant Date: 20170522
Lead Channel Impedance Value: 400 Ohm
Lead Channel Impedance Value: 430 Ohm
Lead Channel Pacing Threshold Amplitude: 0.75 V
Lead Channel Pacing Threshold Pulse Width: 0.5 ms
Lead Channel Sensing Intrinsic Amplitude: 10 mV
Lead Channel Setting Pacing Amplitude: 2.5 V
MDC IDC LEAD IMPLANT DT: 20170522
MDC IDC LEAD LOCATION: 753859
MDC IDC LEAD LOCATION: 753860
MDC IDC MSMT BATTERY REMAINING LONGEVITY: 98 mo
MDC IDC MSMT BATTERY REMAINING PERCENTAGE: 95.5 %
MDC IDC MSMT BATTERY VOLTAGE: 2.99 V
MDC IDC MSMT LEADCHNL RA PACING THRESHOLD PULSEWIDTH: 0.5 ms
MDC IDC MSMT LEADCHNL RA SENSING INTR AMPL: 0.6 mV
MDC IDC MSMT LEADCHNL RV PACING THRESHOLD AMPLITUDE: 0.75 V
MDC IDC PG SERIAL: 7899864
MDC IDC SESS DTM: 20190920014930
MDC IDC SET LEADCHNL RA PACING AMPLITUDE: 2 V
MDC IDC SET LEADCHNL RV PACING PULSEWIDTH: 0.5 ms
MDC IDC SET LEADCHNL RV SENSING SENSITIVITY: 2 mV
MDC IDC STAT BRADY AP VP PERCENT: 5.5 %
MDC IDC STAT BRADY AS VS PERCENT: 1 %
MDC IDC STAT BRADY RA PERCENT PACED: 4.7 %
MDC IDC STAT BRADY RV PERCENT PACED: 98 %

## 2018-02-12 ENCOUNTER — Ambulatory Visit: Payer: Medicare Other | Admitting: Cardiology

## 2018-02-12 DIAGNOSIS — I5043 Acute on chronic combined systolic (congestive) and diastolic (congestive) heart failure: Secondary | ICD-10-CM | POA: Diagnosis not present

## 2018-02-12 DIAGNOSIS — I714 Abdominal aortic aneurysm, without rupture: Secondary | ICD-10-CM | POA: Diagnosis not present

## 2018-02-12 DIAGNOSIS — M1991 Primary osteoarthritis, unspecified site: Secondary | ICD-10-CM | POA: Diagnosis not present

## 2018-02-12 DIAGNOSIS — D509 Iron deficiency anemia, unspecified: Secondary | ICD-10-CM | POA: Diagnosis not present

## 2018-02-12 DIAGNOSIS — I420 Dilated cardiomyopathy: Secondary | ICD-10-CM | POA: Diagnosis not present

## 2018-02-12 DIAGNOSIS — I441 Atrioventricular block, second degree: Secondary | ICD-10-CM | POA: Diagnosis not present

## 2018-02-13 ENCOUNTER — Other Ambulatory Visit: Payer: Self-pay

## 2018-02-13 NOTE — Patient Outreach (Signed)
[facility]) Care Management  02/13/2018  Nadav Swindell [facility] 15-Nov-1918 [id number]    Unsuccessful outreach attempt. Phone rang several times but was unable to leave a voice message.   PLAN Will follow up in 3-4 business days.   [place] [phone]

## 2018-02-16 DIAGNOSIS — I5043 Acute on chronic combined systolic (congestive) and diastolic (congestive) heart failure: Secondary | ICD-10-CM | POA: Diagnosis not present

## 2018-02-16 DIAGNOSIS — M1991 Primary osteoarthritis, unspecified site: Secondary | ICD-10-CM | POA: Diagnosis not present

## 2018-02-16 DIAGNOSIS — D509 Iron deficiency anemia, unspecified: Secondary | ICD-10-CM | POA: Diagnosis not present

## 2018-02-16 DIAGNOSIS — I441 Atrioventricular block, second degree: Secondary | ICD-10-CM | POA: Diagnosis not present

## 2018-02-16 DIAGNOSIS — I420 Dilated cardiomyopathy: Secondary | ICD-10-CM | POA: Diagnosis not present

## 2018-02-16 DIAGNOSIS — I714 Abdominal aortic aneurysm, without rupture: Secondary | ICD-10-CM | POA: Diagnosis not present

## 2018-02-18 ENCOUNTER — Other Ambulatory Visit: Payer: Self-pay

## 2018-02-18 DIAGNOSIS — I5043 Acute on chronic combined systolic (congestive) and diastolic (congestive) heart failure: Secondary | ICD-10-CM | POA: Diagnosis not present

## 2018-02-18 DIAGNOSIS — I420 Dilated cardiomyopathy: Secondary | ICD-10-CM | POA: Diagnosis not present

## 2018-02-18 DIAGNOSIS — I441 Atrioventricular block, second degree: Secondary | ICD-10-CM | POA: Diagnosis not present

## 2018-02-18 DIAGNOSIS — M1991 Primary osteoarthritis, unspecified site: Secondary | ICD-10-CM | POA: Diagnosis not present

## 2018-02-18 DIAGNOSIS — I714 Abdominal aortic aneurysm, without rupture: Secondary | ICD-10-CM | POA: Diagnosis not present

## 2018-02-18 DIAGNOSIS — D509 Iron deficiency anemia, unspecified: Secondary | ICD-10-CM | POA: Diagnosis not present

## 2018-02-18 NOTE — Patient Outreach (Signed)
[facility]) Care Management  02/18/2018  Shawn Frye [facility] [birth year] [id number]    Outreach call successful. Shawn Frye reported feeling well and denied falls since discharge. He was able to answer questions appropriately but reported difficulty hearing. Will complete initial assessment during home visit on next week.   PLAN Will follow up on 02/26/18 for home visit.   [place] [phone]

## 2018-02-19 DIAGNOSIS — I5043 Acute on chronic combined systolic (congestive) and diastolic (congestive) heart failure: Secondary | ICD-10-CM | POA: Diagnosis not present

## 2018-02-19 DIAGNOSIS — D509 Iron deficiency anemia, unspecified: Secondary | ICD-10-CM | POA: Diagnosis not present

## 2018-02-19 DIAGNOSIS — I714 Abdominal aortic aneurysm, without rupture: Secondary | ICD-10-CM | POA: Diagnosis not present

## 2018-02-19 DIAGNOSIS — I420 Dilated cardiomyopathy: Secondary | ICD-10-CM | POA: Diagnosis not present

## 2018-02-19 DIAGNOSIS — I441 Atrioventricular block, second degree: Secondary | ICD-10-CM | POA: Diagnosis not present

## 2018-02-19 DIAGNOSIS — M1991 Primary osteoarthritis, unspecified site: Secondary | ICD-10-CM | POA: Diagnosis not present

## 2018-02-24 DIAGNOSIS — I714 Abdominal aortic aneurysm, without rupture: Secondary | ICD-10-CM | POA: Diagnosis not present

## 2018-02-24 DIAGNOSIS — I420 Dilated cardiomyopathy: Secondary | ICD-10-CM | POA: Diagnosis not present

## 2018-02-24 DIAGNOSIS — I5043 Acute on chronic combined systolic (congestive) and diastolic (congestive) heart failure: Secondary | ICD-10-CM | POA: Diagnosis not present

## 2018-02-24 DIAGNOSIS — M1991 Primary osteoarthritis, unspecified site: Secondary | ICD-10-CM | POA: Diagnosis not present

## 2018-02-24 DIAGNOSIS — I441 Atrioventricular block, second degree: Secondary | ICD-10-CM | POA: Diagnosis not present

## 2018-02-24 DIAGNOSIS — D509 Iron deficiency anemia, unspecified: Secondary | ICD-10-CM | POA: Diagnosis not present

## 2018-02-26 ENCOUNTER — Other Ambulatory Visit: Payer: Self-pay

## 2018-02-26 NOTE — Patient Outreach (Signed)
[place] [facility]) Care Management   02/26/2018  [name] Mar 17, 1919 [id number]  Shawn Frye is an 84 y.o. male  Subjective:  Initial home visit with Shawn Frye. Member alert and oriented x 3. No complaints of pain or shortness of breath. Caregiver Shawn Frye present during the visit.   Objective:   BP 98/60 (BP Location: Right Arm, Patient Position: Sitting, Cuff Size: Normal)   Pulse 88   Resp 17   Ht 1.702 m (5\' 7" )   Wt 133 lb (60.3 kg)   SpO2 95%   BMI 20.83 kg/m   Review of Systems  Constitutional: Negative.   HENT: Positive for hearing loss.   Eyes: Positive for discharge.       Patient reported clear drainage to right eye.  Respiratory: Positive for cough and sputum production.        Patient reported clear sputum.  Cardiovascular: Positive for leg swelling.       Non pitting edema to left ankle.  Gastrointestinal: Negative.   Genitourinary: Positive for frequency and urgency.  Musculoskeletal: Negative.   Skin: Negative.   Neurological: Negative for dizziness, tingling, tremors, weakness and headaches.    Physical Exam  Constitutional: He is oriented to person, place, and time.  Cardiovascular: Normal rate.  Respiratory: Effort normal and breath sounds normal.  GI: Soft. Bowel sounds are normal.  Neurological: He is alert and oriented to person, place, and time.  Skin: Skin is warm and dry.  Psychiatric: He has a normal mood and affect. His behavior is normal. Judgment and thought content normal.    Encounter Medications:   Outpatient Encounter Medications as of 02/26/2018  Medication Sig  . ALPRAZolam (XANAX) 0.25 MG tablet Take 0.125-0.25 mg by mouth daily as needed for anxiety.  . benzonatate (TESSALON PERLES) 100 MG capsule Take 1 capsule (100 mg total) by mouth 3 (three) times daily as needed for cough.  . feeding supplement, ENSURE ENLIVE, (ENSURE ENLIVE) LIQD Take 237 mLs by mouth 2 (two) times daily between meals.  . furosemide  (LASIX) 40 MG tablet Take 1 tablet (40 mg total) by mouth 2 (two) times daily.  Marland Kitchen guaiFENesin (MUCINEX) 600 MG 12 hr tablet Take 1 tablet (600 mg total) by mouth 2 (two) times daily.  Marland Kitchen loratadine (CLARITIN) 10 MG tablet Take 10 mg by mouth daily.  . pantoprazole (PROTONIX) 40 MG tablet Take 1 tablet (40 mg total) by mouth daily.  . potassium chloride SA (K-DUR,KLOR-CON) 20 MEQ tablet Take 2 tablets (40 mEq total) by mouth daily.  . ferrous sulfate 325 (65 FE) MG EC tablet Take 1 tablet by mouth daily.   No facility-administered encounter medications on file as of 02/26/2018.     Functional Status:   In your present state of health, do you have any difficulty performing the following activities: 02/26/2018 01/23/2018  Hearing? [name]  Vision? N N  Difficulty concentrating or making decisions? N N  Walking or climbing stairs? Y Y  Dressing or bathing? N Y  Doing errands, shopping? [name]  Preparing Food and eating ? Y -  Using the Toilet? N -  In the past six months, have you accidently leaked urine? Y -  Do you have problems with loss of bowel control? N -  Managing your Medications? Y -  Comment Medications prepared by caregiver. -  Managing your Finances? Y -  Housekeeping or managing your Housekeeping? Y -  Some recent data might be hidden  Fall/Depression Screening:    Fall Risk  02/26/2018 12/11/2017  Falls in the past year? Yes No  Comment - [place] Telephone Survey: data to providers prior to load  Number falls in past yr: 1 -  Injury with Fall? Yes -  Risk Factor Category  High Fall Risk -  Risk for fall due to : Impaired balance/gait -  Follow up Falls prevention discussed -   PHQ 2/9 Scores 02/26/2018  PHQ - 2 Score 0    Assessment:   Home visit complete. Shawn Frye reported doing well. No complaints of shortness of breath or chest discomfort. Ambulates with cane and able to perform ADLs. Denied falls since discharge. Reviewed goals for care and available [facility] services. He  has a strong support system and a private caregiver in the home 5 days a week. He reported attending appointments as scheduled and denied concerns regarding medication adherence or affordability. Declined current need [facility] SW or [place] referral but agreed to inform RNCM if needs change.  THN CM Care Plan Problem One     Most Recent Value  Care Plan Problem One  Risk for Readmission  Role Documenting the Problem One  Care Management [place] for Problem One  Active  THN Long Term Goal   Over the next 90 days patient will not be hospitalized due to chronic disease complications.  THN Long Term Goal Start Date  02/26/18  Interventions for Problem One Long Term Goal  Reviewed medications, nutrition and importance of weighing daily. Educated patient regarding CHF zones.  THN CM Short Term Goal #1   Over the next 30 days patient will take all medications as prescribed.  THN CM Short Term Goal #1 Start Date  02/26/18  Interventions for Short Term Goal #1  Discussed medications and indications for use.   THN CM Short Term Goal #2   Over the next 30 days patient will monitor and log daily weight.  THN CM Short Term Goal #2 Start Date  02/26/18  Interventions for Short Term Goal #2  Discussed importance of weighing daily and maintaining log. Discussed parameters for reported overnight weight gain greater than 3 lbs.    THN CM Care Plan Problem Two     Most Recent Value  Care Plan Problem Two  High risk for Falls  Role Documenting the Problem Two  Care Management Coordinator  Care Plan for Problem Two  Active  Interventions for Problem Two Long Term Goal   Discussed fall prevention and safety. Discussed use of assistive device and wearing non-skid footwear.  THN Long Term Goal  Patient will not experience fall related injuries over the next 60 days.  THN Long Term Goal Start Date  02/26/18  THN CM Short Term Goal #1   Over the next 30 days patient will use assistive device when  ambulating.  THN CM Short Term Goal #1 Start Date  02/26/18  Interventions for Short Term Goal #2   Discussed importance of using assistive device. Highly encouraged to use device when ambulating outside of the home. [Patient reported using a cane.]  THN CM Short Term Goal #2   Over the next 30 days patient will wear nonskid footwear when ambulating inside and outside of the home.  THN CM Short Term Goal #2 Start Date  02/26/18  Interventions for Short Term Goal #2  Discussed importance of wearing proper footwear to prevent slipping and falls.      PLAN Will follow up on  next week.   [place] [phone]

## 2018-03-02 ENCOUNTER — Ambulatory Visit (INDEPENDENT_AMBULATORY_CARE_PROVIDER_SITE_OTHER): Payer: Medicare Other | Admitting: Otolaryngology

## 2018-03-02 DIAGNOSIS — H6123 Impacted cerumen, bilateral: Secondary | ICD-10-CM | POA: Diagnosis not present

## 2018-03-02 DIAGNOSIS — H903 Sensorineural hearing loss, bilateral: Secondary | ICD-10-CM | POA: Diagnosis not present

## 2018-03-04 ENCOUNTER — Encounter: Payer: Self-pay | Admitting: Student

## 2018-03-04 ENCOUNTER — Ambulatory Visit (INDEPENDENT_AMBULATORY_CARE_PROVIDER_SITE_OTHER): Payer: Medicare Other | Admitting: Student

## 2018-03-04 VITALS — BP 100/56 | HR 88 | Ht 67.0 in | Wt 134.4 lb

## 2018-03-04 DIAGNOSIS — I714 Abdominal aortic aneurysm, without rupture, unspecified: Secondary | ICD-10-CM

## 2018-03-04 DIAGNOSIS — I959 Hypotension, unspecified: Secondary | ICD-10-CM | POA: Diagnosis not present

## 2018-03-04 DIAGNOSIS — D509 Iron deficiency anemia, unspecified: Secondary | ICD-10-CM | POA: Diagnosis not present

## 2018-03-04 DIAGNOSIS — I5042 Chronic combined systolic (congestive) and diastolic (congestive) heart failure: Secondary | ICD-10-CM

## 2018-03-04 DIAGNOSIS — I441 Atrioventricular block, second degree: Secondary | ICD-10-CM | POA: Diagnosis not present

## 2018-03-04 DIAGNOSIS — I5043 Acute on chronic combined systolic (congestive) and diastolic (congestive) heart failure: Secondary | ICD-10-CM | POA: Diagnosis not present

## 2018-03-04 DIAGNOSIS — I420 Dilated cardiomyopathy: Secondary | ICD-10-CM | POA: Diagnosis not present

## 2018-03-04 DIAGNOSIS — M1991 Primary osteoarthritis, unspecified site: Secondary | ICD-10-CM | POA: Diagnosis not present

## 2018-03-04 MED ORDER — FUROSEMIDE 40 MG PO TABS: 40.0000 mg | ORAL_TABLET | Freq: Two times a day (BID) | ORAL | 5 refills | Status: DC

## 2018-03-04 MED ORDER — POTASSIUM CHLORIDE CRYS ER 20 MEQ PO TBCR: 40.0000 meq | EXTENDED_RELEASE_TABLET | Freq: Every day | ORAL | 5 refills | Status: DC

## 2018-03-04 NOTE — Progress Notes (Signed)
Cardiology Office Note    Date:  03/04/2018   ID:  Shawn Frye, DOB 1918-07-10, MRN [id number]  PCP:  [name], MD  Cardiologist: Rozann Lesches, MD   EP: Dr. [name]  Chief Complaint  Patient presents with  . Follow-up    1 month visit    History of Present Illness:    Shawn Frye is a 84 y.o. male with past medical history of chronic combined systolic and diastolic CHF, 2nd Degree Heart Block (s/p St. Jude PPM placement in 07/2015), HTN, HLD, and AAA who presents to the office today for 46-month follow-up.   He was last examined by Dr. Domenic Polite in 01/2018 following a recent hospitalization for an acute CHF exacerbation. He reported having intermittent episodes of dysphasia during admission and did undergo an EGD and dilatation of the distal esophagus by GI.  At the time of his office visit, he denied any recurrent chest discomfort but did have intermittent dyspnea and had been continued on Lasix 40 mg twice daily. Toprol-XL had recently been discontinued due to hypotension and BP was at 100/60 during his office visit which did not allow for this to be added back. He had previously declined invasive cardiac work-up and given his advanced age and DNR status, continued medical management of his cardiomyopathy was recommended.  In talking with the patient today, he reports doing well from a cardiac perspective since his last office visit. He denies any recent exertional chest pain, dyspnea on exertion, orthopnea, PND, or lower extremity edema. He has been following weights at home and these have gradually increased from 130 lbs in 01/2018 to 134 lbs on his home scales today. This has been very gradual and he reports having lost over 15 pounds during his hospitalization in 01/2018 and feels that his appetite has now returned. He has remained off of Toprol-XL and SBP has overall been variable in the 90's to low 100's. Heart rate has been in the 80's to low 100's by review of his HR/BP log  and he denies any associated palpitations. No recent lightheadedness, dizziness, or presyncope.  He is excited about going to visit with his wife after leaving the office today as she resides at [facility] due to having dementia.   Past Medical History:  Diagnosis Date  . AAA (abdominal aortic aneurysm) ([place])   . Arthritis   . Chronic combined systolic (congestive) and diastolic (congestive) heart failure ([place])    a. 10/2017: echo showing a reduced EF of 25%, diffuse HK with akinesis of the apical myocardium, Grade 2 DD, mild AI, mild to moderate MR, and mild to moderate TR  . GERD (gastroesophageal reflux disease)   . Heart block AV second degree    St. Jude pacemaker May 2017 - Dr. [name]  . Infection of bladder   . Joint pain   . Symptomatic bradycardia     Past Surgical History:  Procedure Laterality Date  . BACK SURGERY    . BIOPSY  01/28/2018   Procedure: BIOPSY;  Surgeon: Rogene Houston, MD;  [facility];  Service: Endoscopy;;  pre-pyloric  . CATARACT EXTRACTION W/ INTRAOCULAR LENS  IMPLANT, BILATERAL Bilateral   . EP IMPLANTABLE DEVICE N/A 10/02/2015   Procedure: Pacemaker Implant;  Surgeon: Evans Lance, MD;  Location: [facility];  Service: Cardiovascular;  Laterality: N/A;  . ESOPHAGOGASTRODUODENOSCOPY N/A 01/28/2018   Procedure: ESOPHAGOGASTRODUODENOSCOPY (EGD);  Surgeon: Rogene Houston, MD;  Location: [facility];  Service:  Endoscopy;  Laterality: N/A;  . EXCISIONAL HEMORRHOIDECTOMY    . HEMORROIDECTOMY    . INGUINAL HERNIA REPAIR Bilateral 1991  . INSERT / REPLACE / REMOVE PACEMAKER    . JOINT REPLACEMENT    . [place] SURGERY  2005  . TOTAL KNEE ARTHROPLASTY Bilateral 1998 -  2003  . TOTAL SHOULDER REPLACEMENT Left 1999   Partial  . TYMPANOSTOMY TUBE PLACEMENT Right     Current Medications: Outpatient Medications Prior to Visit  Medication Sig Dispense Refill  . ALPRAZolam (XANAX) 0.25 MG tablet Take 0.125-0.25 mg by mouth daily as  needed for anxiety.    . benzonatate (TESSALON PERLES) 100 MG capsule Take 1 capsule (100 mg total) by mouth 3 (three) times daily as needed for cough. 30 capsule 0  . feeding supplement, ENSURE ENLIVE, (ENSURE ENLIVE) LIQD Take 237 mLs by mouth 2 (two) times daily between meals.    . ferrous sulfate 325 (65 FE) MG EC tablet Take 1 tablet by mouth daily.  1  . guaiFENesin (MUCINEX) 600 MG 12 hr tablet Take 1 tablet (600 mg total) by mouth 2 (two) times daily. 20 tablet 0  . loratadine (CLARITIN) 10 MG tablet Take 10 mg by mouth daily.    . pantoprazole (PROTONIX) 40 MG tablet Take 1 tablet (40 mg total) by mouth daily. 30 tablet 1  . furosemide (LASIX) 40 MG tablet Take 1 tablet (40 mg total) by mouth 2 (two) times daily. 60 tablet 1  . potassium chloride SA (K-DUR,KLOR-CON) 20 MEQ tablet Take 2 tablets (40 mEq total) by mouth daily.     No facility-administered medications prior to visit.      Allergies:   Percocet [oxycodone-acetaminophen]; Noroxin [norfloxacin]; and Celebrex [celecoxib]   Social History   Socioeconomic History  . Marital status: Married    Spouse name: Not on file  . Number of children: Not on file  . Years of education: Not on file  . Highest education level: Not on file  Occupational History  . Not on file  Social Needs  . Financial resource strain: Not on file  . Food insecurity:    Worry: Not on file    Inability: Not on file  . Transportation needs:    Medical: Not on file    Non-medical: Not on file  Tobacco Use  . Smoking status: Former Smoker    Packs/day: 1.00    Years: 15.00    Pack years: 15.00    Types: Cigarettes    Last attempt to quit: 05/13/1950    Years since quitting: 67.8  . Smokeless tobacco: Never Used  Substance and Sexual Activity  . Alcohol use: No    Alcohol/week: 0.0 standard drinks  . Drug use: No  . Sexual activity: Never  Lifestyle  . Physical activity:    Days per week: Not on file    Minutes per session: Not on file    . Stress: Not on file  Relationships  . Social connections:    Talks on phone: Not on file    Gets together: Not on file    Attends religious service: Not on file    Active member of club or organization: Not on file    Attends meetings of clubs or organizations: Not on file    Relationship status: Not on file  Other Topics Concern  . Not on file  Social History Narrative  . Not on file     Family History:  The patient's family history  includes Cystic fibrosis in his paternal aunt; Heart disease in his father; Pneumonia in his mother.   Review of Systems:   Please see the history of present illness.     General:  No chills, fever, night sweats or weight changes.  Cardiovascular:  No chest pain, dyspnea on exertion, edema, orthopnea, palpitations, paroxysmal nocturnal dyspnea. Dermatological: No rash, lesions/masses Respiratory: No cough, dyspnea Urologic: No hematuria, dysuria Abdominal:   No nausea, vomiting, diarrhea, bright red blood per rectum, melena, or hematemesis. Positive for intermittent dysphagia (now improved).  Neurologic:  No visual changes, wkns, changes in mental status.  All other systems reviewed and are otherwise negative except as noted above.   Physical Exam:    VS:  BP (!) 100/56   Pulse 88   Ht 5\' 7"  (1.702 m)   Wt 134 lb 6.4 oz (61 kg)   SpO2 95%   BMI 21.05 kg/m    General: Well developed, elderly Caucasian male appearing in no acute distress. Head: Normocephalic, atraumatic, sclera non-icteric, no xanthomas, nares are without discharge.  Neck: No carotid bruits. JVD not elevated.  Lungs: Respirations regular and unlabored, without wheezes or rales.  Heart: Regular rate and rhythm. No S3 or S4.  No rubs or gallops appreciated. 2/6 SEM along [place].  Abdomen: Soft, non-tender, non-distended with normoactive bowel sounds. No hepatomegaly. No rebound/guarding. No obvious abdominal masses. Msk:  Strength and tone appear normal for age. No joint  deformities or effusions. Extremities: No clubbing or cyanosis. Trace ankle edema.  Distal pedal pulses are 2+ bilaterally. Neuro: Alert and oriented X 3. Moves all extremities spontaneously. No focal deficits noted. Psych:  Responds to questions appropriately with a normal affect. Skin: No rashes or lesions noted  Wt Readings from Last 3 Encounters:  03/04/18 134 lb 6.4 oz (61 kg)  02/26/18 133 lb (60.3 kg)  02/02/18 129 lb (58.5 kg)    Studies/Labs Reviewed:   EKG:  EKG is not ordered today.   Recent Labs: 01/23/2018: ALT 11; B Natriuretic Peptide 2,118.0 01/24/2018: Hemoglobin 9.8; Platelets 197 01/26/2018: Magnesium 2.0 01/28/2018: BUN 21; Creatinine, Ser 0.91; Potassium 3.4; Sodium 138   Lipid Panel No results found for: CHOL, TRIG, HDL, CHOLHDL, VLDL, LDLCALC, LDLDIRECT  Additional studies/ records that were reviewed today include:   Echocardiogram: 12/2017 Study Conclusions  - Left ventricle: The cavity size was mildly dilated. Wall   thickness was normal. Systolic function was severely reduced. The   estimated ejection fraction was 20%. Diffuse hypokinesis.   Features are consistent with a pseudonormal left ventricular   filling pattern, with concomitant abnormal relaxation and   increased filling pressure (grade 2 diastolic dysfunction).   Doppler parameters are consistent with high ventricular filling   pressure. - Regional wall motion abnormality: Akinesis of the apical   myocardium. - Ventricular septum: Septal motion showed abnormal function and   dyssynergy. - Aortic valve: Moderately calcified annulus. Trileaflet. There was   mild regurgitation. - Aorta: Mild aortic root enlargement. - Mitral valve: Calcified annulus. There was moderate   regurgitation. - Left atrium: The atrium was severely dilated. - Right ventricle: Pacer wire or catheter noted in right ventricle.   Systolic function was mildly to moderately reduced. - Right atrium: The atrium was  moderately dilated. - Tricuspid valve: There was moderate-severe regurgitation. - Pulmonic valve: There was mild regurgitation. - Pulmonary arteries: PA peak pressure: 56 mm Hg (S). - Inferior vena cava: The vessel was dilated. The respirophasic   diameter changes were blunted (<  50%), consistent with elevated   central venous pressure.  Assessment:    1. Chronic combined systolic and diastolic heart failure ([place])   2. Hypotension, unspecified hypotension type   3. Second degree heart block   4. AAA (abdominal aortic aneurysm) without rupture ([place])      Plan:   In order of problems listed above:  1. Chronic Combined Systolic and Diastolic CHF - The patient does have a known reduced EF of 20% by most recent echocardiogram in 12/2017. He denies any recent dyspnea on exertion, orthopnea, PND, or lower extremity edema. Weight has increased by 4 pounds over the past month but this has been very gradual in onset and he reports having an increased appetite since most recent hospital discharge. Suspect this is likely due to fat/muscle mass as compared to fluid given the gradual change and him appearing euvolemic by examination today. Would continue on Lasix 40mg  BID and we reviewed that he can take an additional tablet if weight acutely changes by > 3 lbs overnight or > 5 lbs in one week.  - His hypotension does not allow for Toprol-XL or ACE-I/ARB to be restarted at this time. Conseversative measures in regards to his cardiomyopathy have been pursued given his advanced age and him declining invasive procedures.  2. Hypotension - SBP has been in the 90's to low 100's when checked at home and BP is soft at 100/56 during today's visit. He denies any associated lightheadedness, dizziness, or presyncope. Remains on Lasix 40 mg twice daily.  3. 2nd Degree Heart Block - s/p St. Jude PPM placement in 07/2015. Recent interrogation earlier this month showed normal device function. Followed by Dr.  [name].  4. AAA - followed by Vascular Surgery. Measured at 5.6 cm by imaging in 06/2017.   Medication Adjustments/Labs and Tests Ordered: Current medicines are reviewed at length with the patient today.  Concerns regarding medicines are outlined above.  Medication changes, Labs and Tests ordered today are listed in the Patient Instructions below. Patient Instructions  Medication Instructions:  Your physician recommends that you continue on your current medications as directed. Please refer to the Current Medication list given to you today.  If you need a refill on your cardiac medications before your next appointment, please call your pharmacy.   Lab work: NONE  If you have labs (blood work) drawn today and your tests are completely normal, you will receive your results only by: Marland Kitchen MyChart Message (if you have MyChart) OR . A paper copy in the mail If you have any lab test that is abnormal or we need to change your treatment, we will call you to review the results.  Testing/Procedures: NONE   Follow-Up: At [facility], you and your health needs are our priority.  As part of our continuing mission to provide you with exceptional heart care, we have created designated Provider Care Teams.  These Care Teams include your primary Cardiologist (physician) and Advanced Practice Providers (APPs -  Physician Assistants and Nurse Practitioners) who all work together to provide you with the care you need, when you need it. You will need a follow up appointment in 2 months.  Please call our office 2 months in advance to schedule this appointment.  You may see Rozann Lesches, MD or one of the following Advanced Practice Providers on your designated Care Team:   Bernerd Pho, PA-C [facility]) . Ermalinda Barrios, PA-C ([place])  Any Other Special Instructions Will Be Listed Below (If Applicable). Thank  you for choosing [facility]!     Signed, Erma Heritage, PA-C  03/04/2018 5:48 PM    [place] [address] [place], [place] [address] Phone: [phone]

## 2018-03-04 NOTE — Patient Instructions (Signed)
Medication Instructions:  Your physician recommends that you continue on your current medications as directed. Please refer to the Current Medication list given to you today.  If you need a refill on your cardiac medications before your next appointment, please call your pharmacy.   Lab work: NONE  If you have labs (blood work) drawn today and your tests are completely normal, you will receive your results only by: Marland Kitchen MyChart Message (if you have MyChart) OR . A paper copy in the mail If you have any lab test that is abnormal or we need to change your treatment, we will call you to review the results.  Testing/Procedures: NONE   Follow-Up: At [facility], you and your health needs are our priority.  As part of our continuing mission to provide you with exceptional heart care, we have created designated Provider Care Teams.  These Care Teams include your primary Cardiologist (physician) and Advanced Practice Providers (APPs -  Physician Assistants and Nurse Practitioners) who all work together to provide you with the care you need, when you need it. You will need a follow up appointment in 2 months.  Please call our office 2 months in advance to schedule this appointment.  You may see Rozann Lesches, MD or one of the following Advanced Practice Providers on your designated Care Team:   Bernerd Pho, PA-C [facility]) . Ermalinda Barrios, PA-C ([place])  Any Other Special Instructions Will Be Listed Below (If Applicable). Thank you for choosing [name]!

## 2018-03-06 ENCOUNTER — Other Ambulatory Visit: Payer: Self-pay

## 2018-03-06 NOTE — Patient Outreach (Signed)
[facility]) Care Management  03/06/2018  Tomi Paddock [facility] October 16, 1918 [id number]   TOC outreach complete. Mr. Soffer reported doing well today and sitting outside at the time of the call. Attended Cardiology evaluation as scheduled and will follow up in 2 months. Reported no changes since home visit, no falls,  maintains compliance with medications and reported a good appetite. No urgent concerns.   PLAN Will follow up in two weeks.   [place] [phone]

## 2018-03-10 ENCOUNTER — Ambulatory Visit (INDEPENDENT_AMBULATORY_CARE_PROVIDER_SITE_OTHER): Payer: Medicare Other | Admitting: Urology

## 2018-03-10 DIAGNOSIS — R3915 Urgency of urination: Secondary | ICD-10-CM

## 2018-03-10 DIAGNOSIS — N402 Nodular prostate without lower urinary tract symptoms: Secondary | ICD-10-CM

## 2018-03-11 DIAGNOSIS — I420 Dilated cardiomyopathy: Secondary | ICD-10-CM | POA: Diagnosis not present

## 2018-03-11 DIAGNOSIS — M1991 Primary osteoarthritis, unspecified site: Secondary | ICD-10-CM | POA: Diagnosis not present

## 2018-03-11 DIAGNOSIS — D509 Iron deficiency anemia, unspecified: Secondary | ICD-10-CM | POA: Diagnosis not present

## 2018-03-11 DIAGNOSIS — I5043 Acute on chronic combined systolic (congestive) and diastolic (congestive) heart failure: Secondary | ICD-10-CM | POA: Diagnosis not present

## 2018-03-11 DIAGNOSIS — I441 Atrioventricular block, second degree: Secondary | ICD-10-CM | POA: Diagnosis not present

## 2018-03-11 DIAGNOSIS — I714 Abdominal aortic aneurysm, without rupture: Secondary | ICD-10-CM | POA: Diagnosis not present

## 2018-03-17 DIAGNOSIS — I714 Abdominal aortic aneurysm, without rupture: Secondary | ICD-10-CM | POA: Diagnosis not present

## 2018-03-17 DIAGNOSIS — D509 Iron deficiency anemia, unspecified: Secondary | ICD-10-CM | POA: Diagnosis not present

## 2018-03-17 DIAGNOSIS — I441 Atrioventricular block, second degree: Secondary | ICD-10-CM | POA: Diagnosis not present

## 2018-03-17 DIAGNOSIS — I420 Dilated cardiomyopathy: Secondary | ICD-10-CM | POA: Diagnosis not present

## 2018-03-17 DIAGNOSIS — M1991 Primary osteoarthritis, unspecified site: Secondary | ICD-10-CM | POA: Diagnosis not present

## 2018-03-17 DIAGNOSIS — I5043 Acute on chronic combined systolic (congestive) and diastolic (congestive) heart failure: Secondary | ICD-10-CM | POA: Diagnosis not present

## 2018-03-20 ENCOUNTER — Other Ambulatory Visit: Payer: Self-pay

## 2018-03-20 NOTE — Patient Outreach (Signed)
[facility]awn Fry[facility]) Care Management  03/20/2018  Shawn Frye Shawn Frye [birth year] [id number]   Successful outreach with Shawn Frye. Reported feeling well. No complaints of shortness of breath or increased edema. Reported compliance with prescribed medications and daily weights. States he has been ambulating well and tolerating short outings. Denied falls. No urgent concerns or changes in health condition since home visit.  PLAN Will follow up within two weeks.   .[place] [phone]

## 2018-03-25 DIAGNOSIS — D509 Iron deficiency anemia, unspecified: Secondary | ICD-10-CM | POA: Diagnosis not present

## 2018-03-25 DIAGNOSIS — I441 Atrioventricular block, second degree: Secondary | ICD-10-CM | POA: Diagnosis not present

## 2018-03-25 DIAGNOSIS — I5043 Acute on chronic combined systolic (congestive) and diastolic (congestive) heart failure: Secondary | ICD-10-CM | POA: Diagnosis not present

## 2018-03-25 DIAGNOSIS — I420 Dilated cardiomyopathy: Secondary | ICD-10-CM | POA: Diagnosis not present

## 2018-03-25 DIAGNOSIS — I714 Abdominal aortic aneurysm, without rupture: Secondary | ICD-10-CM | POA: Diagnosis not present

## 2018-03-25 DIAGNOSIS — M1991 Primary osteoarthritis, unspecified site: Secondary | ICD-10-CM | POA: Diagnosis not present

## 2018-03-30 ENCOUNTER — Ambulatory Visit: Payer: Self-pay

## 2018-03-30 ENCOUNTER — Other Ambulatory Visit: Payer: Self-pay

## 2018-03-30 NOTE — Patient Outreach (Signed)
[facility]) Care Management  03/30/2018  Shawn Frye [facility] 04-May-1919 [id number]   Successful outreach with Shawn Frye.  Member feeling well. Denied complaints of shortness of breath today but noted episodes of shortness of breath with exertion over the weekend. He continues to have a productive cough with "clear" sputum. Reported a good appetite, no edema and no changes in his sleeping pattern. Compliant with medications and daily weights. Reported today's weight as 141lbs.  Reported weight range of 140lbs-141lbs over the past week. Shawn Frye and his caregiver denied urgent concerns. Encouraged to contact RNCM with questions if needed prior to next scheduled outreach.   PLAN Will follow up next month for home visit.  [place] [phone]

## 2018-04-28 ENCOUNTER — Other Ambulatory Visit: Payer: Self-pay

## 2018-04-28 NOTE — Patient Outreach (Signed)
[facility]) Care Management   04/28/2018  Shawn Frye 14-Jan-1919 [id number]  Shawn Frye is an 84 y.o. male  Subjective:  RNCM in for home visit. Member alert and oriented x 3. No complaints of pain or discomfort at RNCM's arrival.  Objective:   Review of Systems  Constitutional: Negative.   HENT: Positive for hearing loss.        Requires hearing aid.  Eyes: Negative.   Respiratory: Positive for cough and sputum production.   Cardiovascular: Positive for leg swelling.       +1 pitting edema note to left foot.  Gastrointestinal: Negative.  Negative for abdominal pain, constipation, diarrhea, heartburn, nausea and vomiting.  Genitourinary: Negative.   Musculoskeletal: Negative.   Skin: Negative.   Neurological: Negative.     Physical Exam  Constitutional: He is oriented to person, place, and time. He appears well-developed and well-nourished.  Cardiovascular: Normal rate.  Respiratory: Effort normal and breath sounds normal.  GI: Soft. Bowel sounds are normal.  Musculoskeletal:        General: Edema present.     Comments: + 1 pitting edema to left foot.  Neurological: He is alert and oriented to person, place, and time.  Skin: Skin is warm and dry.  Psychiatric: He has a normal mood and affect. His behavior is normal. Judgment and thought content normal.    Encounter Medications:   Outpatient Encounter Medications as of 04/28/2018  Medication Sig  . ALPRAZolam (XANAX) 0.25 MG tablet Take 0.125-0.25 mg by mouth daily as needed for anxiety.  . benzonatate (TESSALON PERLES) 100 MG capsule Take 1 capsule (100 mg total) by mouth 3 (three) times daily as needed for cough.  . feeding supplement, ENSURE ENLIVE, (ENSURE ENLIVE) LIQD Take 237 mLs by mouth 2 (two) times daily between meals.  . ferrous sulfate 325 (65 FE) MG EC tablet Take 1 tablet by mouth daily.  . furosemide (LASIX) 40 MG tablet Take 1 tablet (40 mg total) by mouth 2 (two) times daily.  Marland Kitchen  guaiFENesin (MUCINEX) 600 MG 12 hr tablet Take 1 tablet (600 mg total) by mouth 2 (two) times daily. (Patient not taking: Reported on 04/28/2018)  . loratadine (CLARITIN) 10 MG tablet Take 10 mg by mouth daily.  . pantoprazole (PROTONIX) 40 MG tablet Take 1 tablet (40 mg total) by mouth daily.  . potassium chloride SA (K-DUR,KLOR-CON) 20 MEQ tablet Take 2 tablets (40 mEq total) by mouth daily.   No facility-administered encounter medications on file as of 04/28/2018.     Functional Status:   In your present state of health, do you have any difficulty performing the following activities: 02/26/2018 01/23/2018  Hearing? Shawn Frye  Vision? N N  Difficulty concentrating or making decisions? N N  Walking or climbing stairs? Y Y  Dressing or bathing? N Y  Doing errands, shopping? Shawn Frye  Preparing Food and eating ? Y -  Using the Toilet? N -  In the past six months, have you accidently leaked urine? Y -  Do you have problems with loss of bowel control? N -  Managing your Medications? Y -  Comment Medications prepared by caregiver. -  Managing your Finances? Y -  Housekeeping or managing your Housekeeping? Y -  Some recent data might be hidden    Fall/Depression Screening:    Fall Risk  02/26/2018 12/11/2017  Falls in the past year? Yes No  Comment - Emmi Telephone Survey: data to providers prior to load  Number falls in past yr: 1 -  Injury with Fall? Yes -  Risk Factor Category  High Fall Risk -  Risk for fall due to : Impaired balance/gait -  Follow up Falls prevention discussed -   PHQ 2/9 Scores 04/28/2018 02/26/2018  PHQ - 2 Score 0 0    Assessment:   Home visit complete. Shawn Frye reported feeling fatigued. Denied complaints of shortness of breath or chest discomfort. Stated his fatigue was likely due to being very busy with errands on yesterday. Denied worsening symptoms but noticed decreased activity tolerance on today.  He remains compliant with medications, nutrition and daily  weights. Member drives, ambulates well, and follows recommended safety and fall precautions.   Reviewed care management goals. He has a good support system and private caregiver is still available to assist 5 days week. Declined need for community outreach referrals or additional assistance in the home. No transportation needs. He is very knowledgeable of his medications and indications for use. Denied concerns regarding medication adherence or affordability. Shawn Frye has progressed well since discharge. Discussed plan for transfer to health coach on next month. Member agreeable.  Will reassess and discharge to health coach if no hospitalization prior to next [employer].  THN CM Care Plan Problem One     Most Recent Value  Care Plan Problem One  Risk for Readmission  Role Documenting the Problem One  Care Management [place] for Problem One  Active  THN Long Term Goal   Over the next 90 days patient will not be hospitalized due to chronic disease complications.  THN Long Term Goal Start Date  02/26/18  Interventions for Problem One Long Term Goal  Reviewed medications,daily weights and CHF zones. [Discussed precautions with patient and caregiver.]  THN CM Short Term Goal #1   Over the next 30 days patient will take all medications as prescribed.  THN CM Short Term Goal #1 Start Date  02/26/18  THN CM Short Term Goal #1 Met Date  03/30/18  THN CM Short Term Goal #2   Over the next 30 days patient will monitor and log daily weight.  THN CM Short Term Goal #2 Start Date  02/26/18  THN CM Short Term Goal #2 Met Date  03/30/18    [facility] CM Care Plan Problem Two     Most Recent Value  Care Plan Problem Two  High risk for Falls  Role Documenting the Problem Two  Care Management Coordinator  Care Plan for Problem Two  Active  THN Long Term Goal  Patient will not experience fall related injuries over the next 60 days.  THN Long Term Goal Start Date  02/26/18  THN Long Term Goal Met  Date  04/28/18  THN CM Short Term Goal #1   Over the next 30 days patient will use assistive device when ambulating.  THN CM Short Term Goal #1 Start Date  02/26/18  THN CM Short Term Goal #1 Met Date   03/30/18  THN CM Short Term Goal #2   Over the next 30 days patient will wear nonskid footwear when ambulating inside and outside of the home.  THN CM Short Term Goal #2 Start Date  02/26/18  THN CM Short Term Goal #2 Met Date  03/30/18      PLAN  Will follow up on next month.  [place] [phone]

## 2018-05-01 ENCOUNTER — Ambulatory Visit (INDEPENDENT_AMBULATORY_CARE_PROVIDER_SITE_OTHER): Payer: Medicare Other

## 2018-05-01 DIAGNOSIS — I441 Atrioventricular block, second degree: Secondary | ICD-10-CM

## 2018-05-01 DIAGNOSIS — R001 Bradycardia, unspecified: Secondary | ICD-10-CM | POA: Diagnosis not present

## 2018-05-01 NOTE — Progress Notes (Signed)
Remote pacemaker transmission.   

## 2018-05-04 ENCOUNTER — Encounter: Payer: Self-pay | Admitting: Cardiology

## 2018-05-04 ENCOUNTER — Ambulatory Visit (INDEPENDENT_AMBULATORY_CARE_PROVIDER_SITE_OTHER): Payer: Medicare Other | Admitting: Cardiology

## 2018-05-04 VITALS — BP 98/60 | HR 96 | Ht 67.0 in | Wt 141.6 lb

## 2018-05-04 DIAGNOSIS — I441 Atrioventricular block, second degree: Secondary | ICD-10-CM | POA: Diagnosis not present

## 2018-05-04 DIAGNOSIS — I5042 Chronic combined systolic (congestive) and diastolic (congestive) heart failure: Secondary | ICD-10-CM | POA: Diagnosis not present

## 2018-05-04 NOTE — Progress Notes (Signed)
Cardiology Office Note  Date: 05/04/2018   ID: Shawn Frye, DOB [birth year], MRN [id number]  PCP: [name], MD  Primary Cardiologist: Rozann Lesches, MD   Chief Complaint  Patient presents with  . Cardiomyopathy    History of Present Illness: Shawn Frye is a 84 y.o. male last seen by Ms. Strader PA-C in October.  He is here today with his wife for a follow-up visit.  He is being managed conservatively with chronic combined heart failure and LVEF approximately 20%.  I reviewed his home weight and blood pressure checks, he has been around 140 to 141 pounds over the last month.  He runs a low systolic blood pressure therefore limiting further treatment options.  He has chronic dyspnea on exertion NYHA class III, uses a walker.  At this point he is on Lasix 40 mg twice daily along with potassium supplements.  No ARB or ACE inhibitor, no beta-blocker.  Past Medical History:  Diagnosis Date  . AAA (abdominal aortic aneurysm) ([place])   . Arthritis   . Chronic combined systolic (congestive) and diastolic (congestive) heart failure ([place])    a. 10/2017: echo showing a reduced EF of 25%, diffuse HK with akinesis of the apical myocardium, Grade 2 DD, mild AI, mild to moderate MR, and mild to moderate TR  . GERD (gastroesophageal reflux disease)   . Heart block AV second degree    St. Jude pacemaker May 2017 - Dr. [name]  . Infection of bladder   . Joint pain   . Symptomatic bradycardia     Past Surgical History:  Procedure Laterality Date  . BACK SURGERY    . BIOPSY  01/28/2018   Procedure: BIOPSY;  Surgeon: Rogene Houston, MD;  [facility];  Service: Endoscopy;;  pre-pyloric  . CATARACT EXTRACTION W/ INTRAOCULAR LENS  IMPLANT, BILATERAL Bilateral   . EP IMPLANTABLE DEVICE N/A 10/02/2015   Procedure: Pacemaker Implant;  Surgeon: Evans Lance, MD;  Location: [facility] [facility];  Service: Cardiovascular;  Laterality: N/A;  . ESOPHAGOGASTRODUODENOSCOPY N/A 01/28/2018     Procedure: ESOPHAGOGASTRODUODENOSCOPY (EGD);  Surgeon: Rogene Houston, MD;  [facility];  Service: Endoscopy;  Laterality: N/A;  . EXCISIONAL HEMORRHOIDECTOMY    . HEMORROIDECTOMY    . INGUINAL HERNIA REPAIR Bilateral 1991  . INSERT / REPLACE / REMOVE PACEMAKER    . JOINT REPLACEMENT    . [place] SURGERY  2005  . TOTAL KNEE ARTHROPLASTY Bilateral 1998 -  2003  . TOTAL SHOULDER REPLACEMENT Left 1999   Partial  . TYMPANOSTOMY TUBE PLACEMENT Right     Current Outpatient Medications  Medication Sig Dispense Refill  . ALPRAZolam (XANAX) 0.25 MG tablet Take 0.125-0.25 mg by mouth daily as needed for anxiety.    . feeding supplement, ENSURE ENLIVE, (ENSURE ENLIVE) LIQD Take 237 mLs by mouth 2 (two) times daily between meals.    . ferrous sulfate 325 (65 FE) MG EC tablet Take 1 tablet by mouth daily.  1  . furosemide (LASIX) 40 MG tablet Take 1 tablet (40 mg total) by mouth 2 (two) times daily. 60 tablet 5  . loratadine (CLARITIN) 10 MG tablet Take 10 mg by mouth daily.    . pantoprazole (PROTONIX) 40 MG tablet Take 1 tablet (40 mg total) by mouth daily. 30 tablet 1  . potassium chloride SA (K-DUR,KLOR-CON) 20 MEQ tablet Take 2 tablets (40 mEq total) by mouth daily. 60 tablet 5   No current  facility-administered medications for this visit.    Allergies:  Percocet [oxycodone-acetaminophen]; Noroxin [norfloxacin]; and Celebrex [celecoxib]   Social History: The patient  reports that he quit smoking about 68 years ago. His smoking use included cigarettes. He has a 15.00 pack-year smoking history. He has never used smokeless tobacco. He reports that he does not drink alcohol or use drugs.   ROS:  Please see the history of present illness. Otherwise, complete review of systems is positive for hearing loss.  All other systems are reviewed and negative.   Physical Exam: VS:  BP 98/60   Pulse 96   Ht 5\' 7"  (1.702 m)   Wt 141 lb 9.6 oz (64.2 kg)   SpO2 99%   BMI 22.18 kg/m ,  BMI Body mass index is 22.18 kg/m.  Wt Readings from Last 3 Encounters:  05/04/18 141 lb 9.6 oz (64.2 kg)  04/28/18 141 lb (64 kg)  03/04/18 134 lb 6.4 oz (61 kg)    General: Elderly male in no distress. HEENT: Conjunctiva and lids normal, oropharynx clear. Neck: Supple, no elevated JVP or carotid bruits, no thyromegaly. Lungs: Decreased breath sounds, nonlabored breathing at rest. Cardiac: Distant RRR, no S3 or significant systolic murmur. Abdomen: Soft, nontender, bowel sounds present. Extremities: Trace ankle edema, distal pulses 1-2+. Skin: Warm and dry. Musculoskeletal: Kyphosis noted. Neuropsychiatric: Alert and oriented x3, affect grossly appropriate.  Hearing loss evident.  ECG: I personally reviewed the tracing from 01/23/2018 which showed ventricular pacing with intermittent dual-chamber pacing.  Recent Labwork: 01/23/2018: ALT 11; AST 20; B Natriuretic Peptide 2,118.0 01/24/2018: Hemoglobin 9.8; Platelets 197 01/26/2018: Magnesium 2.0 01/28/2018: BUN 21; Creatinine, Ser 0.91; Potassium 3.4; Sodium 138   Other Studies Reviewed Today:  Echocardiogram 12/14/2017: Study Conclusions  - Left ventricle: The cavity size was mildly dilated. Wall   thickness was normal. Systolic function was severely reduced. The   estimated ejection fraction was 20%. Diffuse hypokinesis.   Features are consistent with a pseudonormal left ventricular   filling pattern, with concomitant abnormal relaxation and   increased filling pressure (grade 2 diastolic dysfunction).   Doppler parameters are consistent with high ventricular filling   pressure. - Regional wall motion abnormality: Akinesis of the apical   myocardium. - Ventricular septum: Septal motion showed abnormal function and   dyssynergy. - Aortic valve: Moderately calcified annulus. Trileaflet. There was   mild regurgitation. - Aorta: Mild aortic root enlargement. - Mitral valve: Calcified annulus. There was moderate    regurgitation. - Left atrium: The atrium was severely dilated. - Right ventricle: Pacer wire or catheter noted in right ventricle.   Systolic function was mildly to moderately reduced. - Right atrium: The atrium was moderately dilated. - Tricuspid valve: There was moderate-severe regurgitation. - Pulmonic valve: There was mild regurgitation. - Pulmonary arteries: PA peak pressure: 56 mm Hg (S). - Inferior vena cava: The vessel was dilated. The respirophasic   diameter changes were blunted (< 50%), consistent with elevated   central venous pressure.  Assessment and Plan:  1.  Chronic combined heart failure with LVEF approximately 35%, diastolic dysfunction of moderate degree, and reduced RV contraction with moderate to severe pulmonary hypertension.  He is being managed conservatively and has limited treatment options due to low normal to low blood pressure at baseline.  Weight has been stable on current diuretic regimen along with potassium supplements.  No changes were made today.  2.  Symptomatic second-degree heart block status post St. Jude pacemaker.  He follows with Dr. [name].  Current medicines were reviewed with the patient today.  Disposition: Follow-up in 3 months.  Signed, Satira Sark, MD, [facility] 05/04/2018 1:38 PM    [facility] [place], [place], [place] [address] Phone: [phone]; Fax: [phone]

## 2018-05-04 NOTE — Patient Instructions (Signed)
Medication Instructions:  Your physician recommends that you continue on your current medications as directed. Please refer to the Current Medication list given to you today.  If you need a refill on your cardiac medications before your next appointment, please call your pharmacy.   Lab work: None today If you have labs (blood work) drawn today and your tests are completely normal, you will receive your results only by: . MyChart Message (if you have MyChart) OR . A paper copy in the mail If you have any lab test that is abnormal or we need to change your treatment, we will call you to review the results.  Testing/Procedures: None today    Follow-Up: At [facility], you and your health needs are our priority.  As part of our continuing mission to provide you with exceptional heart care, we have created designated Provider Care Teams.  These Care Teams include your primary Cardiologist (physician) and Advanced Practice Providers (APPs -  Physician Assistants and Nurse Practitioners) who all work together to provide you with the care you need, when you need it. You will need a follow up appointment in 3 months.  Please call our office 2 months in advance to schedule this appointment.  You may see Samuel McDowell, MD or one of the following Advanced Practice Providers on your designated Care Team:   Brittany Strader, PA-C ([place] Office) . Michele Lenze, PA-C ([place] Office)  Any Other Special Instructions Will Be Listed Below (If Applicable). None   

## 2018-05-18 ENCOUNTER — Other Ambulatory Visit: Payer: Self-pay

## 2018-05-18 NOTE — Patient Outreach (Signed)
[facility]) Care Management  05/18/2018  Per [facility] 02/11/[phone]   Successful outreach with Shawn Frye.  Reported "breathing pretty good" today but experienced several episodes of shortness of breath while ambulating over the weekend. Denied complaints of pain or chest discomfort.  Reported increased coughing over the past week. Stated mucous was "thick and clear." Shawn Frye reported that he has been taking medications as prescribed, weighing daily, eating and sleeping well but concerned about his decreased activity tolerance. Reported he will be evaluated and discuss with MD later today. RNCM will follow up prior to transfer to health coach.   PLAN Will follow up next week.   [place] [phone]

## 2018-05-21 ENCOUNTER — Encounter (INDEPENDENT_AMBULATORY_CARE_PROVIDER_SITE_OTHER): Payer: Self-pay | Admitting: Internal Medicine

## 2018-05-21 ENCOUNTER — Ambulatory Visit (INDEPENDENT_AMBULATORY_CARE_PROVIDER_SITE_OTHER): Payer: Medicare Other | Admitting: Internal Medicine

## 2018-05-21 VITALS — BP 96/61 | HR 101 | Temp 94.6°F | Ht 67.0 in | Wt 143.6 lb

## 2018-05-21 DIAGNOSIS — R151 Fecal smearing: Secondary | ICD-10-CM | POA: Diagnosis not present

## 2018-05-21 NOTE — Progress Notes (Addendum)
Subjective:    Patient ID: Shawn Frye, male    DOB: 01-09-1919, 85 y.o.   MRN: [id number]  HPI Presents today with c/o fecal seepage. He says after his BM, he will have some seepage.  He has a BM daily. Has had symptoms x 2 weeks. Usually has one BM daily.  Has not tried anything for the seepage.   Appetite is okay. No weight loss.   Past Medical History:  Diagnosis Date  . AAA (abdominal aortic aneurysm) ([place])   . Arthritis   . Chronic combined systolic (congestive) and diastolic (congestive) heart failure ([place])    a. 10/2017: echo showing a reduced EF of 25%, diffuse HK with akinesis of the apical myocardium, Grade 2 DD, mild AI, mild to moderate MR, and mild to moderate TR  . GERD (gastroesophageal reflux disease)   . Heart block AV second degree    St. Jude pacemaker May 2017 - Dr. [name]  . Infection of bladder   . Joint pain   . Symptomatic bradycardia     Past Surgical History:  Procedure Laterality Date  . BACK SURGERY    . BIOPSY  01/28/2018   Procedure: BIOPSY;  Surgeon: Rogene Houston, MD;  [facility];  Service: Endoscopy;;  pre-pyloric  . CATARACT EXTRACTION W/ INTRAOCULAR LENS  IMPLANT, BILATERAL Bilateral   . EP IMPLANTABLE DEVICE N/A 10/02/2015   Procedure: Pacemaker Implant;  Surgeon: [place] Lance, MD;  Location: [place] [facility];  Service: Cardiovascular;  Laterality: N/A;  . ESOPHAGOGASTRODUODENOSCOPY N/A 01/28/2018   Procedure: ESOPHAGOGASTRODUODENOSCOPY (EGD);  Surgeon: Rogene Houston, MD;  [facility];  Service: Endoscopy;  Laterality: N/A;  . EXCISIONAL HEMORRHOIDECTOMY    . HEMORROIDECTOMY    . INGUINAL HERNIA REPAIR Bilateral 1991  . INSERT / REPLACE / REMOVE PACEMAKER    . JOINT REPLACEMENT    . [place] SURGERY  2005  . TOTAL KNEE ARTHROPLASTY Bilateral 1998 -  2003  . TOTAL SHOULDER REPLACEMENT Left 1999   Partial  . TYMPANOSTOMY TUBE PLACEMENT Right     Allergies  Allergen Reactions  . Percocet  [Oxycodone-Acetaminophen] Hives  . Noroxin [Norfloxacin] Nausea And Vomiting  . Celebrex [Celecoxib] Rash    Current Outpatient Medications on File Prior to Visit  Medication Sig Dispense Refill  . ALPRAZolam (XANAX) 0.25 MG tablet Take 0.125-0.25 mg by mouth daily as needed for anxiety.    . feeding supplement, ENSURE ENLIVE, (ENSURE ENLIVE) LIQD Take 237 mLs by mouth 2 (two) times daily between meals.    . ferrous sulfate 325 (65 FE) MG EC tablet Take 1 tablet by mouth daily.  1  . furosemide (LASIX) 40 MG tablet Take 1 tablet (40 mg total) by mouth 2 (two) times daily. 60 tablet 5  . loratadine (CLARITIN) 10 MG tablet Take 10 mg by mouth daily.    . pantoprazole (PROTONIX) 40 MG tablet Take 1 tablet (40 mg total) by mouth daily. 30 tablet 1  . potassium chloride SA (K-DUR,KLOR-CON) 20 MEQ tablet Take 2 tablets (40 mEq total) by mouth daily. 60 tablet 5   No current facility-administered medications on file prior to visit.             Objective:   Physical Exam Blood pressure 96/61, pulse (!) 101, temperature (!) 94.6 F (34.8 C), height 5\' 7"  (1.702 m), weight 143 lb 9.6 oz (65.1 kg). Alert and oriented. Skin warm and dry. Oral mucosa is moist.   .  Sclera anicteric, conjunctivae is pink. Thyroid not enlarged. No cervical lymphadenopathy. Lungs clear. Heart regular rate and rhythm.  Abdomen is soft. Bowel sounds are positive. No hepatomegaly. No abdominal masses felt. No tenderness.  No edema to lower extremities.  Rectal sphincter very relaxed.         Assessment & Plan:  Fecal leakage. He was advised to increase fiber in his diet. Try getting Fiber OTC.  Or try Fiber 1 Bars.

## 2018-05-21 NOTE — Patient Instructions (Signed)
Try Fiber po daily. Fiber 1 bars.

## 2018-05-25 ENCOUNTER — Ambulatory Visit: Payer: Medicare Other

## 2018-05-31 LAB — CUP PACEART REMOTE DEVICE CHECK
Battery Remaining Longevity: 97 mo
Battery Remaining Percentage: 95.5 %
Battery Voltage: 2.98 V
Brady Statistic AP VS Percent: 1 %
Brady Statistic RA Percent Paced: 1.3 %
Implantable Lead Implant Date: 20170522
Implantable Lead Location: 753859
Implantable Lead Location: 753860
Implantable Pulse Generator Implant Date: 20170522
Lead Channel Impedance Value: 360 Ohm
Lead Channel Impedance Value: 410 Ohm
Lead Channel Pacing Threshold Amplitude: 0.75 V
Lead Channel Pacing Threshold Pulse Width: 0.5 ms
Lead Channel Sensing Intrinsic Amplitude: 0.7 mV
MDC IDC LEAD IMPLANT DT: 20170522
MDC IDC MSMT LEADCHNL RV PACING THRESHOLD AMPLITUDE: 0.75 V
MDC IDC MSMT LEADCHNL RV PACING THRESHOLD PULSEWIDTH: 0.5 ms
MDC IDC MSMT LEADCHNL RV SENSING INTR AMPL: 12 mV
MDC IDC SESS DTM: 20191219090013
MDC IDC SET LEADCHNL RA PACING AMPLITUDE: 2 V
MDC IDC SET LEADCHNL RV PACING AMPLITUDE: 2.5 V
MDC IDC SET LEADCHNL RV PACING PULSEWIDTH: 0.5 ms
MDC IDC SET LEADCHNL RV SENSING SENSITIVITY: 2 mV
MDC IDC STAT BRADY AP VP PERCENT: 1.5 %
MDC IDC STAT BRADY AS VP PERCENT: 97 %
MDC IDC STAT BRADY AS VS PERCENT: 1 %
MDC IDC STAT BRADY RV PERCENT PACED: 99 %
Pulse Gen Serial Number: 7899864

## 2018-06-01 DIAGNOSIS — R0602 Shortness of breath: Secondary | ICD-10-CM | POA: Diagnosis not present

## 2018-06-01 DIAGNOSIS — R0689 Other abnormalities of breathing: Secondary | ICD-10-CM | POA: Diagnosis not present

## 2018-06-01 DIAGNOSIS — R531 Weakness: Secondary | ICD-10-CM | POA: Diagnosis not present

## 2018-06-02 ENCOUNTER — Other Ambulatory Visit: Payer: Self-pay

## 2018-06-02 ENCOUNTER — Encounter (HOSPITAL_COMMUNITY): Payer: Self-pay | Admitting: Emergency Medicine

## 2018-06-02 ENCOUNTER — Emergency Department (HOSPITAL_COMMUNITY)
Admission: EM | Admit: 2018-06-02 | Discharge: 2018-06-02 | Disposition: A | Discharge status: Home / Self Care | Payer: Medicare Other | Attending: Emergency Medicine | Admitting: Emergency Medicine

## 2018-06-02 ENCOUNTER — Emergency Department (HOSPITAL_COMMUNITY): Payer: Medicare Other

## 2018-06-02 DIAGNOSIS — I509 Heart failure, unspecified: Secondary | ICD-10-CM | POA: Diagnosis not present

## 2018-06-02 DIAGNOSIS — R0989 Other specified symptoms and signs involving the circulatory and respiratory systems: Secondary | ICD-10-CM | POA: Insufficient documentation

## 2018-06-02 DIAGNOSIS — J810 Acute pulmonary edema: Secondary | ICD-10-CM | POA: Diagnosis not present

## 2018-06-02 DIAGNOSIS — I429 Cardiomyopathy, unspecified: Secondary | ICD-10-CM | POA: Insufficient documentation

## 2018-06-02 DIAGNOSIS — N179 Acute kidney failure, unspecified: Secondary | ICD-10-CM

## 2018-06-02 DIAGNOSIS — Z79899 Other long term (current) drug therapy: Secondary | ICD-10-CM | POA: Insufficient documentation

## 2018-06-02 DIAGNOSIS — R0602 Shortness of breath: Secondary | ICD-10-CM | POA: Diagnosis not present

## 2018-06-02 LAB — CBC WITH DIFFERENTIAL/PLATELET
Abs Immature Granulocytes: 0.03 10*3/uL (ref 0.00–0.07)
Basophils Absolute: 0 10*3/uL (ref 0.0–0.1)
Basophils Relative: 0 %
EOS ABS: 0.1 10*3/uL (ref 0.0–0.5)
EOS PCT: 1 %
HCT: 31.9 % — ABNORMAL LOW (ref 39.0–52.0)
Hemoglobin: 9.9 g/dL — ABNORMAL LOW (ref 13.0–17.0)
Immature Granulocytes: 0 %
Lymphocytes Relative: 11 %
Lymphs Abs: 0.8 10*3/uL (ref 0.7–4.0)
MCH: 28.6 pg (ref 26.0–34.0)
MCHC: 31 g/dL (ref 30.0–36.0)
MCV: 92.2 fL (ref 80.0–100.0)
Monocytes Absolute: 0.9 10*3/uL (ref 0.1–1.0)
Monocytes Relative: 12 %
NEUTROS PCT: 76 %
NRBC: 0 % (ref 0.0–0.2)
Neutro Abs: 5.7 10*3/uL (ref 1.7–7.7)
PLATELETS: 163 10*3/uL (ref 150–400)
RBC: 3.46 MIL/uL — AB (ref 4.22–5.81)
RDW: 15.5 % (ref 11.5–15.5)
WBC: 7.6 10*3/uL (ref 4.0–10.5)

## 2018-06-02 LAB — COMPREHENSIVE METABOLIC PANEL
ALBUMIN: 3.6 g/dL (ref 3.5–5.0)
ALT: 12 U/L (ref 0–44)
ANION GAP: 11 (ref 5–15)
AST: 22 U/L (ref 15–41)
Alkaline Phosphatase: 126 U/L (ref 38–126)
BUN: 30 mg/dL — ABNORMAL HIGH (ref 8–23)
CHLORIDE: 103 mmol/L (ref 98–111)
CO2: 24 mmol/L (ref 22–32)
Calcium: 9.2 mg/dL (ref 8.9–10.3)
Creatinine, Ser: 1.64 mg/dL — ABNORMAL HIGH (ref 0.61–1.24)
GFR calc Af Amer: 40 mL/min — ABNORMAL LOW (ref >60)
GFR calc non Af Amer: 35 mL/min — ABNORMAL LOW (ref >60)
GLUCOSE: 113 mg/dL — AB (ref 70–99)
POTASSIUM: 3.9 mmol/L (ref 3.5–5.1)
SODIUM: 138 mmol/L (ref 135–145)
Total Bilirubin: 1.4 mg/dL — ABNORMAL HIGH (ref 0.3–1.2)
Total Protein: 8 g/dL (ref 6.5–8.1)

## 2018-06-02 LAB — TROPONIN I: Troponin I: <0.03 ng/mL (ref <0.03)

## 2018-06-02 LAB — BRAIN NATRIURETIC PEPTIDE: B Natriuretic Peptide: 1695 pg/mL — ABNORMAL HIGH (ref 0.0–100.0)

## 2018-06-02 MED ORDER — FUROSEMIDE 10 MG/ML IJ SOLN
20.0000 mg | Freq: Once | INTRAMUSCULAR | Status: AC
  Administered 2018-06-02: 20 mg via INTRAVENOUS
  Filled 2018-06-02: qty 2

## 2018-06-02 NOTE — ED Triage Notes (Signed)
Pt was sent by PCP for a 15 lb weight gain and increased sob x 1 month.

## 2018-06-02 NOTE — ED Provider Notes (Signed)
[facility] EMERGENCY DEPARTMENT Provider Note   CSN: [id number] Arrival date & time: 06/02/18  1129     History   Chief Complaint Chief Complaint  Patient presents with  . Shortness of Breath    HPI Shawn Frye is a 85 y.o. male.  Pt presents to the ED today with SOB.  The pt said the SOB occurs when he is up moving around.  He is followed by cardiology (Dr. Domenic Polite) for cardiomyopathy.  He has a LVEF of 20%.  On his last visit there (05/04/18), he weighed 64.2 kg (141.5 lb) and had been weighing between 140-141 for the past month.  At a GI visit on 1/9, he was 65.1 kg (143.5 lb).  He has been more SOB over the weekend and had some indigestion yesterday.  He lives alone, but has a caregiver who is there Mon-Fri.  She called EMS when he had the indigestion yesterday.  They came to check him out and everything looked ok and he did not want to go to the hospital, so he stayed at home.  He said he had a [facility] which made him belch and indigestion went away.  He had a doctor's appt today.  His weight was up from his last admission here, so his doctor was worried about CHF and sent him here.  Pt is on lasix bid with potassium.  He runs a low SBP, so treatment options are limited.  Pt denies CP.  He is 65.6 kg today.       Past Medical History:  Diagnosis Date  . AAA (abdominal aortic aneurysm) ([place])   . Arthritis   . Chronic combined systolic (congestive) and diastolic (congestive) heart failure ([place])    a. 10/2017: echo showing a reduced EF of 25%, diffuse HK with akinesis of the apical myocardium, Grade 2 DD, mild AI, mild to moderate MR, and mild to moderate TR  . GERD (gastroesophageal reflux disease)   . Heart block AV second degree    St. Jude pacemaker May 2017 - Dr. [name]  . Infection of bladder   . Joint pain   . Symptomatic bradycardia     Patient Active Problem List   Diagnosis Date Noted  . Dysphagia   . Pleural effusion on right   . Pleural effusion  01/23/2018  . CHF (congestive heart failure) ([place]) 01/23/2018  . Malnutrition of moderate degree 01/14/2018  . Shortness of breath   . HCAP (healthcare-associated pneumonia) 01/12/2018  . Pneumonia 01/12/2018  . Chronic systolic CHF (congestive heart failure) ([place]) 01/12/2018  . Elevated troponin 01/12/2018  . Acute on chronic combined systolic and diastolic CHF (congestive heart failure) ([place]) 12/14/2017  . Acute respiratory failure with hypoxia ([place]) 12/13/2017  . Acute on chronic systolic CHF (congestive heart failure) ([place]) 12/13/2017  . Dilated cardiomyopathy ([place]) 12/13/2017  . Pacemaker 12/13/2017  . Hyponatremia 12/13/2017  . Primary osteoarthritis, right shoulder 01/08/2017  . Mobitz type 2 second degree atrioventricular block 10/02/2015  . Bradycardia 08/29/2015  . Aneurysm of abdominal vessel ([place]) 03/21/2011    Past Surgical History:  Procedure Laterality Date  . BACK SURGERY    . BIOPSY  01/28/2018   Procedure: BIOPSY;  Surgeon: Rogene Houston, MD;  [facility];  Service: Endoscopy;;  pre-pyloric  . CATARACT EXTRACTION W/ INTRAOCULAR LENS  IMPLANT, BILATERAL Bilateral   . EP IMPLANTABLE DEVICE N/A 10/02/2015   Procedure: Pacemaker Implant;  Surgeon: [place] Lance, MD;  Location: [place] [facility]  LAB;  Service: Cardiovascular;  Laterality: N/A;  . ESOPHAGOGASTRODUODENOSCOPY N/A 01/28/2018   Procedure: ESOPHAGOGASTRODUODENOSCOPY (EGD);  Surgeon: Rogene Houston, MD;  [facility];  Service: Endoscopy;  Laterality: N/A;  . EXCISIONAL HEMORRHOIDECTOMY    . HEMORROIDECTOMY    . INGUINAL HERNIA REPAIR Bilateral 1991  . INSERT / REPLACE / REMOVE PACEMAKER    . JOINT REPLACEMENT    . [place] SURGERY  2005  . TOTAL KNEE ARTHROPLASTY Bilateral 1998 -  2003  . TOTAL SHOULDER REPLACEMENT Left 1999   Partial  . TYMPANOSTOMY TUBE PLACEMENT Right         Home Medications    Prior to Admission medications   Medication Sig Start Date End Date  Taking? Authorizing Provider  ALPRAZolam (XANAX) 0.25 MG tablet Take 0.125-0.25 mg by mouth daily as needed for anxiety.    [provider]  feeding supplement, ENSURE ENLIVE, (ENSURE ENLIVE) LIQD Take 237 mLs by mouth 2 (two) times daily between meals. 01/15/18   Barton Dubois, MD  ferrous sulfate 325 (65 FE) MG EC tablet Take 1 tablet by mouth daily. 12/04/17   [provider]  furosemide (LASIX) 40 MG tablet Take 1 tablet (40 mg total) by mouth 2 (two) times daily. 03/04/18   Strader, Fransisco Hertz, PA-C  loratadine (CLARITIN) 10 MG tablet Take 10 mg by mouth daily.    [provider]  pantoprazole (PROTONIX) 40 MG tablet Take 1 tablet (40 mg total) by mouth daily. 01/29/18 01/29/19  Kathie Dike, MD  potassium chloride SA (K-DUR,KLOR-CON) 20 MEQ tablet Take 2 tablets (40 mEq total) by mouth daily. 03/04/18   Erma Heritage, PA-C    Family History Family History  Problem Relation Age of Onset  . Heart disease Father        NOT  before age 86  . Pneumonia Mother   . Cystic fibrosis Paternal Aunt   . Kidney disease Neg Hx     Social History Social History   Tobacco Use  . Smoking status: Former Smoker    Packs/day: 1.00    Years: 15.00    Pack years: 15.00    Types: Cigarettes    Last attempt to quit: 05/13/1950    Years since quitting: 68.1  . Smokeless tobacco: Never Used  Substance Use Topics  . Alcohol use: No    Alcohol/week: 0.0 standard drinks  . Drug use: No     Allergies   Percocet [oxycodone-acetaminophen]; Noroxin [norfloxacin]; and Celebrex [celecoxib]   Review of Systems Review of Systems  Respiratory: Positive for shortness of breath.   All other systems reviewed and are negative.    Physical Exam Updated Vital Signs BP 100/68   Pulse 95   Temp 98.7 F (37.1 C) (Oral)   Resp (!) 30   Wt 65.6 kg   SpO2 99%   BMI 22.65 kg/m   Physical Exam Vitals signs and nursing note reviewed.  Constitutional:      Appearance:  He is well-developed.  HENT:     Head: Normocephalic and atraumatic.     Mouth/Throat:     Mouth: Mucous membranes are moist.     Pharynx: Oropharynx is clear.  Eyes:     Extraocular Movements: Extraocular movements intact.     Pupils: Pupils are equal, round, and reactive to light.  Neck:     Musculoskeletal: Normal range of motion and neck supple.  Cardiovascular:     Rate and Rhythm: Normal rate and regular rhythm.  Pulmonary:     Effort: Pulmonary effort is normal.     Breath sounds: Normal breath sounds.  Abdominal:     General: Bowel sounds are normal.     Palpations: Abdomen is soft.  Musculoskeletal: Normal range of motion.     Left lower leg: Edema present.     Comments: Left leg chronically more swollen than right  Skin:    General: Skin is warm and dry.     Capillary Refill: Capillary refill takes less than 2 seconds.  Neurological:     General: No focal deficit present.     Mental Status: He is alert and oriented to person, place, and time.  Psychiatric:        Mood and Affect: Mood normal.        Behavior: Behavior normal.      ED Treatments / Results  Labs (all labs ordered are listed, but only abnormal results are displayed) Labs Reviewed  CBC WITH DIFFERENTIAL/PLATELET - Abnormal; Notable for the following components:      Result Value   RBC 3.46 (*)    Hemoglobin 9.9 (*)    HCT 31.9 (*)    All other components within normal limits  COMPREHENSIVE METABOLIC PANEL - Abnormal; Notable for the following components:   Glucose, Bld 113 (*)    BUN 30 (*)    Creatinine, Ser 1.64 (*)    Total Bilirubin 1.4 (*)    GFR calc non Af Amer 35 (*)    GFR calc Af Amer 40 (*)    All other components within normal limits  BRAIN NATRIURETIC PEPTIDE - Abnormal; Notable for the following components:   B Natriuretic Peptide 1,695.0 (*)    All other components within normal limits  TROPONIN I    EKG EKG Interpretation  Date/Time:  Tuesday June 02 2018 12:05:17  EST Ventricular Rate:  96 PR Interval:    QRS Duration: 175 QT Interval:  432 QTC Calculation: 546 R Axis:   -80 Text Interpretation:  AV dual-paced rhythm No significant change since last tracing Confirmed by Isla Pence [phone]) on 06/02/2018 12:29:50 PM   Radiology Dg Chest 2 View  Result Date: 06/02/2018 CLINICAL DATA:  Worsening shortness of breath and 15 pound weight gain over the past month. History of bradycardia. EXAM: CHEST - 2 VIEW COMPARISON:  PA and lateral chest and CT chest 01/23/2018. PA and lateral chest 10/03/2015. FINDINGS: There is cardiomegaly pulmonary vascular congestion. No consolidative process, pneumothorax or effusion. Pacing device is in place. Aortic atherosclerosis is noted. The patient is status post left shoulder replacement. IMPRESSION: Cardiomegaly and pulmonary vascular congestion. Atherosclerosis. Electronically Signed   By: Inge Rise M.D.   On: 06/02/2018 12:04    Procedures Procedures (including critical care time)  Medications Ordered in ED Medications  furosemide (LASIX) injection 20 mg (20 mg Intravenous Given 06/02/18 1408)     Initial Impression / Assessment and Plan / ED Course  I have reviewed the triage vital signs and the nursing notes.  Pertinent labs & imaging results that were available during my care of the patient were reviewed by me and considered in my medical decision making (see chart for details).    Weight here is 0.5 kg more than it was on 1/9.  He was given 20 mg Lasix IV here.  He was able to ambulate around the hall with O2 sats 100% on RA and feeling well.  No sob.  I will keep meds the same as Cr  is slightly elevated and BP is low.  The pt is instructed to f/u with his pcp and to return if worse.  He will need to get a repeat BMP in 1 week.  Final Clinical Impressions(s) / ED Diagnoses   Final diagnoses:  Pulmonary vascular congestion  AKI (acute kidney injury) [facility])    ED Discharge Orders    None        Isla Pence, MD 06/02/18 1425

## 2018-06-02 NOTE — Discharge Instructions (Addendum)
You need to get a blood test in about 1 week to recheck your kidneys.  Return if your shortness of breath worsens or if you develop chest pain.

## 2018-06-05 ENCOUNTER — Other Ambulatory Visit: Payer: Self-pay

## 2018-06-05 NOTE — Patient Outreach (Signed)
[facility] ([facility]) Care Management  06/05/2018  Shawn Frye 03/21/1919 [id number]   Successful follow-up with Shawn Frye. Denied worsening symptoms since being evaluated in the Emergency Department earlier this week. No complaints of chest discomfort but reported shortness of breath with prolonged activity. Reported continued compliance with medications and daily weights. Denied weight gain or edema since ED visit. He is pending follow-ups with Cardiology and PCP. Agreeable to reassessment after MD follow-ups.   [facility] CM Care Plan Problem One     Most Recent Value  Care Plan Problem One  Risk for Readmission  Role Documenting the Problem One  Care Management Coordinator  Care Plan for Problem One  Active  [facility] Long Term Goal   Over the next 90 days patient will not be hospitalized due to chronic disease complications.  [facility] Long Term Goal Start Date  02/26/18  Interventions for Problem One Long Term Goal  Discussed CHF zones, weight and activity tolerance. Reviewed indictions for notifying MD. [Pt evaluated in ED earlier this week.]  [facility] CM Short Term Goal #1   Over the next 30 days patient will take all medications as prescribed.  [facility] CM Short Term Goal #1 Start Date  02/26/18  [facility] CM Short Term Goal #1 Met Date  03/30/18  [facility] CM Short Term Goal #2   Over the next 30 days patient will monitor and log daily weight.  [facility] CM Short Term Goal #2 Start Date  02/26/18  [facility] CM Short Term Goal #2 Met Date  03/30/18    [facility] CM Care Plan Problem Two     Most Recent Value  Care Plan Problem Two  High risk for Falls  Role Documenting the Problem Two  Care Management Coordinator  Care Plan for Problem Two  Active  [facility] Long Term Goal  Patient will not experience fall related injuries over the next 60 days.  [facility] Long Term Goal Start Date  02/26/18  [facility] Long Term Goal Met Date  04/28/18  [facility] CM Short Term Goal #1   Over the next 30 days patient will use assistive device when ambulating.  [facility]  CM Short Term Goal #1 Start Date  02/26/18  [facility] CM Short Term Goal #1 Met Date   03/30/18  [facility] CM Short Term Goal #2   Over the next 30 days patient will wear nonskid footwear when ambulating inside and outside of the home.  [facility] CM Short Term Goal #2 Start Date  02/26/18  [facility] CM Short Term Goal #2 Met Date  03/30/18      PLAN Will continue routine outreach.   Felecia McCray,RN [facility] Community Care Manager [phone]     

## 2018-06-08 DIAGNOSIS — I420 Dilated cardiomyopathy: Secondary | ICD-10-CM | POA: Diagnosis not present

## 2018-06-08 DIAGNOSIS — I499 Cardiac arrhythmia, unspecified: Secondary | ICD-10-CM | POA: Diagnosis not present

## 2018-06-08 DIAGNOSIS — E43 Unspecified severe protein-calorie malnutrition: Secondary | ICD-10-CM | POA: Diagnosis not present

## 2018-06-08 DIAGNOSIS — I442 Atrioventricular block, complete: Secondary | ICD-10-CM | POA: Diagnosis not present

## 2018-06-08 DIAGNOSIS — I714 Abdominal aortic aneurysm, without rupture: Secondary | ICD-10-CM | POA: Diagnosis not present

## 2018-06-08 DIAGNOSIS — D509 Iron deficiency anemia, unspecified: Secondary | ICD-10-CM | POA: Diagnosis not present

## 2018-06-08 DIAGNOSIS — I5042 Chronic combined systolic (congestive) and diastolic (congestive) heart failure: Secondary | ICD-10-CM | POA: Diagnosis not present

## 2018-06-08 DIAGNOSIS — N179 Acute kidney failure, unspecified: Secondary | ICD-10-CM | POA: Diagnosis not present

## 2018-06-08 DIAGNOSIS — F419 Anxiety disorder, unspecified: Secondary | ICD-10-CM | POA: Diagnosis not present

## 2018-06-08 DIAGNOSIS — D649 Anemia, unspecified: Secondary | ICD-10-CM | POA: Diagnosis not present

## 2018-06-08 DIAGNOSIS — I5033 Acute on chronic diastolic (congestive) heart failure: Secondary | ICD-10-CM | POA: Diagnosis not present

## 2018-06-08 DIAGNOSIS — E876 Hypokalemia: Secondary | ICD-10-CM | POA: Diagnosis not present

## 2018-06-08 DIAGNOSIS — I5043 Acute on chronic combined systolic (congestive) and diastolic (congestive) heart failure: Secondary | ICD-10-CM | POA: Diagnosis not present

## 2018-06-18 ENCOUNTER — Ambulatory Visit: Payer: Medicare Other | Admitting: Student

## 2018-06-18 ENCOUNTER — Ambulatory Visit (INDEPENDENT_AMBULATORY_CARE_PROVIDER_SITE_OTHER): Payer: Medicare Other | Admitting: Student

## 2018-06-18 ENCOUNTER — Encounter: Payer: Self-pay | Admitting: Student

## 2018-06-18 VITALS — BP 110/60 | HR 88 | Ht 67.0 in | Wt 148.0 lb

## 2018-06-18 DIAGNOSIS — I714 Abdominal aortic aneurysm, without rupture, unspecified: Secondary | ICD-10-CM

## 2018-06-18 DIAGNOSIS — I5042 Chronic combined systolic (congestive) and diastolic (congestive) heart failure: Secondary | ICD-10-CM

## 2018-06-18 DIAGNOSIS — I441 Atrioventricular block, second degree: Secondary | ICD-10-CM | POA: Diagnosis not present

## 2018-06-18 DIAGNOSIS — N179 Acute kidney failure, unspecified: Secondary | ICD-10-CM

## 2018-06-18 MED ORDER — FUROSEMIDE 40 MG PO TABS: 40.0000 mg | ORAL_TABLET | Freq: Two times a day (BID) | ORAL | 11 refills | Status: DC

## 2018-06-18 NOTE — Progress Notes (Deleted)
Cardiology Office Note    Date:  06/18/2018   ID:  Shawn Frye, DOB Apr 01, 1919, MRN [id number]  PCP:  [name], MD  Cardiologist: Rozann Lesches, MD    No chief complaint on file.   History of Present Illness:    Shawn Frye is a 85 y.o. male with past medical history of chronic combined systolic and diastolic CHF (EF 69% by echo in 12/2017), 2nd Degree Heart Block (s/p St. Jude PPM placement in 07/2015), HTN, HLD, and AAA who presents to the office today for Emergency Department follow-up.   He was last examined by Dr. Domenic Polite in 04/2018 and reported having chronic dyspnea on exertion but denied any recent chest pain or palpitations. A conservative management strategy has been pursued in regards to his cardiomyopathy in the setting of his advanced age and he remains on Lasix 40 mg twice daily with BB or ACE-I/ARB unable to be added secondary to low BP.   In the interim, he presented to [facility] ED on 06/02/2018 for evaluation of a 15 pound weight gain and worsening dyspnea over the past month. In reviewing weights from his office visit in 04/2018, this had not significantly changed as it was 64.2 kg in 04/2018 and 65.6 kg while in the ED. Labs showed that BNP was elevated to 1695 and creatinine had trended upwards from 0.91 in 01/2018 to 1.64. Hgb was stable at 9.9. IV Lasix 20mg  was administered and saturations remained appropriate with ambulation. No changes were made to his medications at discharge.     Past Medical History:  Diagnosis Date  . AAA (abdominal aortic aneurysm) ([place])   . Arthritis   . Chronic combined systolic (congestive) and diastolic (congestive) heart failure ([place])    a. 10/2017: echo showing a reduced EF of 25%, diffuse HK with akinesis of the apical myocardium, Grade 2 DD, mild AI, mild to moderate MR, and mild to moderate TR  . GERD (gastroesophageal reflux disease)   . Heart block AV second degree    St. Jude pacemaker May 2017 - Dr. [name]  .  Infection of bladder   . Joint pain   . Symptomatic bradycardia     Past Surgical History:  Procedure Laterality Date  . BACK SURGERY    . BIOPSY  01/28/2018   Procedure: BIOPSY;  Surgeon: Rogene Houston, MD;  Location: [facility];  Service: Endoscopy;;  pre-pyloric  . CATARACT EXTRACTION W/ INTRAOCULAR LENS  IMPLANT, BILATERAL Bilateral   . EP IMPLANTABLE DEVICE N/A 10/02/2015   Procedure: Pacemaker Implant;  Surgeon: Evans Lance, MD;  Location: [place] CV LAB;  Service: Cardiovascular;  Laterality: N/A;  . ESOPHAGOGASTRODUODENOSCOPY N/A 01/28/2018   Procedure: ESOPHAGOGASTRODUODENOSCOPY (EGD);  Surgeon: Rogene Houston, MD;  Location: [facility];  Service: Endoscopy;  Laterality: N/A;  . EXCISIONAL HEMORRHOIDECTOMY    . HEMORROIDECTOMY    . INGUINAL HERNIA REPAIR Bilateral 1991  . INSERT / REPLACE / REMOVE PACEMAKER    . JOINT REPLACEMENT    . [place] SURGERY  2005  . TOTAL KNEE ARTHROPLASTY Bilateral 1998 -  2003  . TOTAL SHOULDER REPLACEMENT Left 1999   Partial  . TYMPANOSTOMY TUBE PLACEMENT Right     Current Medications: Outpatient Medications Prior to Visit  Medication Sig Dispense Refill  . ALPRAZolam (XANAX) 0.25 MG tablet Take 0.125-0.25 mg by mouth daily as needed for anxiety.    . feeding supplement, ENSURE ENLIVE, (ENSURE ENLIVE) LIQD Take 237 mLs  by mouth 2 (two) times daily between meals.    . ferrous sulfate 325 (65 FE) MG EC tablet Take 1 tablet by mouth daily.  1  . furosemide (LASIX) 40 MG tablet Take 1 tablet (40 mg total) by mouth 2 (two) times daily. 60 tablet 5  . loratadine (CLARITIN) 10 MG tablet Take 10 mg by mouth daily.    . pantoprazole (PROTONIX) 40 MG tablet Take 1 tablet (40 mg total) by mouth daily. 30 tablet 1  . potassium chloride SA (K-DUR,KLOR-CON) 20 MEQ tablet Take 2 tablets (40 mEq total) by mouth daily. 60 tablet 5   No facility-administered medications prior to visit.      Allergies:   Percocet  [oxycodone-acetaminophen]; Noroxin [norfloxacin]; and Celebrex [celecoxib]   Social History   Socioeconomic History  . Marital status: Married    Spouse name: Not on file  . Number of children: Not on file  . Years of education: Not on file  . Highest education level: Not on file  Occupational History  . Not on file  Social Needs  . Financial resource strain: Not on file  . Food insecurity:    Worry: Not on file    Inability: Not on file  . Transportation needs:    Medical: Not on file    Non-medical: Not on file  Tobacco Use  . Smoking status: Former Smoker    Packs/day: 1.00    Years: 15.00    Pack years: 15.00    Types: Cigarettes    Last attempt to quit: 05/13/1950    Years since quitting: 68.1  . Smokeless tobacco: Never Used  Substance and Sexual Activity  . Alcohol use: No    Alcohol/week: 0.0 standard drinks  . Drug use: No  . Sexual activity: Never  Lifestyle  . Physical activity:    Days per week: Not on file    Minutes per session: Not on file  . Stress: Not on file  Relationships  . Social connections:    Talks on phone: Not on file    Gets together: Not on file    Attends religious service: Not on file    Active member of club or organization: Not on file    Attends meetings of clubs or organizations: Not on file    Relationship status: Not on file  Other Topics Concern  . Not on file  Social History Narrative  . Not on file     Family History:  The patient's ***family history includes Cystic fibrosis in his paternal aunt; Heart disease in his father; Pneumonia in his mother.   Review of Systems:   Please see the history of present illness.     General:  No chills, fever, night sweats or weight changes.  Cardiovascular:  No chest pain, dyspnea on exertion, edema, orthopnea, palpitations, paroxysmal nocturnal dyspnea. Dermatological: No rash, lesions/masses Respiratory: No cough, dyspnea Urologic: No hematuria, dysuria Abdominal:   No nausea,  vomiting, diarrhea, bright red blood per rectum, melena, or hematemesis Neurologic:  No visual changes, wkns, changes in mental status. All other systems reviewed and are otherwise negative except as noted above.   Physical Exam:    VS:  There were no vitals taken for this visit.   General: Well developed, well nourished,male appearing in no acute distress. Head: Normocephalic, atraumatic, sclera non-icteric, no xanthomas, nares are without discharge.  Neck: No carotid bruits. JVD not elevated.  Lungs: Respirations regular and unlabored, without wheezes or rales.  Heart: ***Regular  rate and rhythm. No S3 or S4.  No murmur, no rubs, or gallops appreciated. Abdomen: Soft, non-tender, non-distended with normoactive bowel sounds. No hepatomegaly. No rebound/guarding. No obvious abdominal masses. Msk:  Strength and tone appear normal for age. No joint deformities or effusions. Extremities: No clubbing or cyanosis. No edema.  Distal pedal pulses are 2+ bilaterally. Neuro: Alert and oriented X 3. Moves all extremities spontaneously. No focal deficits noted. Psych:  Responds to questions appropriately with a normal affect. Skin: No rashes or lesions noted  Wt Readings from Last 3 Encounters:  06/02/18 144 lb 9.6 oz (65.6 kg)  05/21/18 143 lb 9.6 oz (65.1 kg)  05/04/18 141 lb 9.6 oz (64.2 kg)        Studies/Labs Reviewed:   EKG:  EKG is*** ordered today.  The ekg ordered today demonstrates ***  Recent Labs: 01/26/2018: Magnesium 2.0 06/02/2018: ALT 12; B Natriuretic Peptide 1,695.0; BUN 30; Creatinine, Ser 1.64; Hemoglobin 9.9; Platelets 163; Potassium 3.9; Sodium 138   Lipid Panel No results found for: CHOL, TRIG, HDL, CHOLHDL, VLDL, LDLCALC, LDLDIRECT  Additional studies/ records that were reviewed today include:   Echocardiogram: 12/2017 Study Conclusions  - Left ventricle: The cavity size was mildly dilated. Wall   thickness was normal. Systolic function was severely reduced.  The   estimated ejection fraction was 20%. Diffuse hypokinesis.   Features are consistent with a pseudonormal left ventricular   filling pattern, with concomitant abnormal relaxation and   increased filling pressure (grade 2 diastolic dysfunction).   Doppler parameters are consistent with high ventricular filling   pressure. - Regional wall motion abnormality: Akinesis of the apical   myocardium. - Ventricular septum: Septal motion showed abnormal function and   dyssynergy. - Aortic valve: Moderately calcified annulus. Trileaflet. There was   mild regurgitation. - Aorta: Mild aortic root enlargement. - Mitral valve: Calcified annulus. There was moderate   regurgitation. - Left atrium: The atrium was severely dilated. - Right ventricle: Pacer wire or catheter noted in right ventricle.   Systolic function was mildly to moderately reduced. - Right atrium: The atrium was moderately dilated. - Tricuspid valve: There was moderate-severe regurgitation. - Pulmonic valve: There was mild regurgitation. - Pulmonary arteries: PA peak pressure: 56 mm Hg (S). - Inferior vena cava: The vessel was dilated. The respirophasic   diameter changes were blunted (< 50%), consistent with elevated   central venous pressure.  Assessment:    No diagnosis found.   Plan:   In order of problems listed above:  1. Chronic Combined Systolic and Diastolic CHF - ***  2. Hypotension - ***  3. 2nd Degree AV Block - ***  4. AAA - ***   Medication Adjustments/Labs and Tests Ordered: Current medicines are reviewed at length with the patient today.  Concerns regarding medicines are outlined above.  Medication changes, Labs and Tests ordered today are listed in the Patient Instructions below. There are no Patient Instructions on file for this visit.   Signed, Erma Heritage, PA-C  06/18/2018 7:19 AM    [facility] [address] [place],  [address] Phone: [phone] Fax: [phone]

## 2018-06-18 NOTE — Patient Instructions (Addendum)
Medication Instructions:  Your physician has recommended you make the following change in your medication:  Increase Lasix to 40 mg two Times Daily   Call our office with weights on Monday   If you need a refill on your cardiac medications before your next appointment, please call your pharmacy.   Lab work: NONE  If you have labs (blood work) drawn today and your tests are completely normal, you will receive your results only by: [facility] MyChart Message (if you have MyChart) OR . A paper copy in the mail If you have any lab test that is abnormal or we need to change your treatment, we will call you to review the results.  Testing/Procedures: NONE   Follow-Up: At [facility], you and your health needs are our priority.  As part of our continuing mission to provide you with exceptional heart care, we have created designated Provider Care Teams.  These Care Teams include your primary Cardiologist (physician) and Advanced Practice Providers (APPs -  Physician Assistants and Nurse Practitioners) who all work together to provide you with the care you need, when you need it. You will need a follow up appointment in 3-4  weeks.  Please call our office 2 months in advance to schedule this appointment.  You may see Rozann Lesches, MD or one of the following Advanced Practice Providers on your designated Care Team:   Bernerd Pho, PA-C [facility]) . Ermalinda Barrios, PA-C ([place])  Any Other Special Instructions Will Be Listed Below (If Applicable). Thank you for choosing [facility]!     Two Gram Sodium Diet 2000 mg  What is Sodium? Sodium is a mineral found naturally in many foods. The most significant source of sodium in the diet is table salt, which is about 40% sodium.  Processed, convenience, and preserved foods also contain a large amount of sodium.  The body needs only 500 mg of sodium daily to function,  A normal diet provides more than enough sodium even if  you do not use salt.  Why Limit Sodium? A build up of sodium in the body can cause thirst, increased blood pressure, shortness of breath, and water retention.  Decreasing sodium in the diet can reduce edema and risk of heart attack or stroke associated with high blood pressure.  Keep in mind that there are many other factors involved in these health problems.  Heredity, obesity, lack of exercise, cigarette smoking, stress and what you eat all play a role.  General Guidelines:  Do not add salt at the table or in cooking.  One teaspoon of salt contains over 2 grams of sodium.  Read food labels  Avoid processed and convenience foods  Ask your dietitian before eating any foods not dicussed in the menu planning guidelines  Consult your physician if you wish to use a salt substitute or a sodium containing medication such as antacids.  Limit milk and milk products to 16 oz (2 cups) per day.  Shopping Hints:  READ LABELS!! "Dietetic" does not necessarily mean low sodium.  Salt and other sodium ingredients are often added to foods during processing.   Menu Planning Guidelines Food Group Choose More Often Avoid  Beverages (see also the milk group All fruit juices, low-sodium, salt-free vegetables juices, low-sodium carbonated beverages Regular vegetable or tomato juices, commercially softened water used for drinking or cooking  Breads and Cereals Enriched white, wheat, rye and pumpernickel bread, hard rolls and dinner rolls; muffins, cornbread and waffles; most dry cereals,  cooked cereal without added salt; unsalted crackers and breadsticks; low sodium or homemade bread crumbs Bread, rolls and crackers with salted tops; quick breads; instant hot cereals; pancakes; commercial bread stuffing; self-rising flower and biscuit mixes; regular bread crumbs or cracker crumbs  Desserts and Sweets Desserts and sweets mad with mild should be within allowance Instant pudding mixes and cake mixes  Fats Butter or  margarine; vegetable oils; unsalted salad dressings, regular salad dressings limited to 1 Tbs; light, sour and heavy cream Regular salad dressings containing bacon fat, bacon bits, and salt pork; snack dips made with instant soup mixes or processed cheese; salted nuts  Fruits Most fresh, frozen and canned fruits Fruits processed with salt or sodium-containing ingredient (some dried fruits are processed with sodium sulfites        Vegetables Fresh, frozen vegetables and low- sodium canned vegetables Regular canned vegetables, sauerkraut, pickled vegetables, and others prepared in brine; frozen vegetables in sauces; vegetables seasoned with ham, bacon or salt pork  Condiments, Sauces, Miscellaneous  Salt substitute with physician's approval; pepper, herbs, spices; vinegar, lemon or lime juice; hot pepper sauce; garlic powder, onion powder, low sodium soy sauce (1 Tbs.); low sodium condiments (ketchup, chili sauce, mustard) in limited amounts (1 tsp.) fresh ground horseradish; unsalted tortilla chips, pretzels, potato chips, popcorn, salsa (1/4 cup) Any seasoning made with salt including garlic salt, celery salt, onion salt, and seasoned salt; sea salt, rock salt, kosher salt; meat tenderizers; monosodium glutamate; mustard, regular soy sauce, barbecue, sauce, chili sauce, teriyaki sauce, steak sauce, Worcestershire sauce, and most flavored vinegars; canned gravy and mixes; regular condiments; salted snack foods, olives, picles, relish, horseradish sauce, catsup   Food preparation: Try these seasonings Meats:    Pork Sage, onion Serve with applesauce  Chicken Poultry seasoning, thyme, parsley Serve with cranberry sauce  Lamb Curry powder, rosemary, garlic, thyme Serve with mint sauce or jelly  Veal Marjoram, basil Serve with current jelly, cranberry sauce  Beef Pepper, bay leaf Serve with dry mustard, unsalted chive butter  Fish [place] leaf, dill Serve with unsalted lemon butter, unsalted parsley butter   Vegetables:    Asparagus Lemon juice   Broccoli Lemon juice   Carrots Mustard dressing parsley, mint, nutmeg, glazed with unsalted butter and sugar   Green beans Marjoram, lemon juice, nutmeg,dill seed   Tomatoes Basil, marjoram, onion   Spice /blend for [employer]" 4 tsp ground thyme 1 tsp ground sage 3 tsp ground rosemary 4 tsp ground marjoram   Test your knowledge 1. A product that says "Salt Free" may still contain sodium. True or False 2. Garlic Powder and Hot Pepper Sauce an be used as alternative seasonings.True or False 3. Processed foods have more sodium than fresh foods.  True or False 4. Canned Vegetables have less sodium than froze True or False  WAYS TO DECREASE YOUR SODIUM INTAKE 1. Avoid the use of added salt in cooking and at the table.  Table salt (and other prepared seasonings which contain salt) is probably one of the greatest sources of sodium in the diet.  Unsalted foods can gain flavor from the sweet, sour, and butter taste sensations of herbs and spices.  Instead of using salt for seasoning, try the following seasonings with the foods listed.  Remember: how you use them to enhance natural food flavors is limited only by your creativity... Allspice-Meat, fish, eggs, fruit, peas, red and yellow vegetables Almond Extract-Fruit baked goods Anise Seed-Sweet breads, fruit, carrots, beets, cottage cheese, cookies (tastes like licorice)  Basil-Meat, fish, eggs, vegetables, rice, vegetables salads, soups, sauces Bay Leaf-Meat, fish, stews, poultry Burnet-Salad, vegetables (cucumber-like flavor) Caraway Seed-Bread, cookies, cottage cheese, meat, vegetables, cheese, rice Cardamon-Baked goods, fruit, soups Celery Powder or seed-Salads, salad dressings, sauces, meatloaf, soup, bread.Do not use  celery salt Chervil-Meats, salads, fish, eggs, vegetables, cottage cheese (parsley-like flavor) Chili Power-Meatloaf, chicken cheese, corn, eggplant, egg dishes Chives-Salads cottage  cheese, egg dishes, soups, vegetables, sauces Cilantro-Salsa, casseroles Cinnamon-Baked goods, fruit, pork, lamb, chicken, carrots Cloves-Fruit, baked goods, fish, pot roast, green beans, beets, carrots Coriander-Pastry, cookies, meat, salads, cheese (lemon-orange flavor) Cumin-Meatloaf, fish,cheese, eggs, cabbage,fruit pie (caraway flavor) [employer], fruit, eggs, fish, poultry, cottage cheese, vegetables Dill Seed-Meat, cottage cheese, poultry, vegetables, fish, salads, bread Fennel Seed-Bread, cookies, apples, pork, eggs, fish, beets, cabbage, cheese, Licorice-like flavor Garlic-(buds or powder) Salads, meat, poultry, fish, bread, butter, vegetables, potatoes.Do not  use garlic salt Ginger-Fruit, vegetables, baked goods, meat, fish, poultry Horseradish Root-Meet, vegetables, butter Lemon Juice or Extract-Vegetables, fruit, tea, baked goods, fish salads Mace-Baked goods fruit, vegetables, fish, poultry (taste like nutmeg) Maple Extract-Syrups Marjoram-Meat, chicken, fish, vegetables, breads, green salads (taste like Sage) Mint-Tea, lamb, sherbet, vegetables, desserts, carrots, cabbage Mustard, Dry or Seed-Cheese, eggs, meats, vegetables, poultry Nutmeg-Baked goods, fruit, chicken, eggs, vegetables, desserts Onion Powder-Meat, fish, poultry, vegetables, cheese, eggs, bread, rice salads (Do not use   Onion salt) Orange Extract-Desserts, baked goods Oregano-Pasta, eggs, cheese, onions, pork, lamb, fish, chicken, vegetables, green salads Paprika-Meat, fish, poultry, eggs, cheese, vegetables Parsley Flakes-Butter, vegetables, meat fish, poultry, eggs, bread, salads (certain forms may   Contain sodium Pepper-Meat fish, poultry, vegetables, eggs Peppermint Extract-Desserts, baked goods Poppy Seed-Eggs, bread, cheese, fruit dressings, baked goods, noodles, vegetables, cottage  [employer], poultry, meat, fish, cauliflower, turnips,eggs  bread Saffron-Rice, bread, veal, chicken, fish, eggs Sage-Meat, fish, poultry, onions, eggplant, tomateos, pork, stews Savory-Eggs, salads, poultry, meat, rice, vegetables, soups, pork Tarragon-Meat, poultry, fish, eggs, butter, vegetables (licorice-like flavor)  Thyme-Meat, poultry, fish, eggs, vegetables, (clover-like flavor), sauces, soups Tumeric-Salads, butter, eggs, fish, rice, vegetables (saffron-like flavor) Vanilla Extract-Baked goods, candy Vinegar-Salads, vegetables, meat marinades Walnut Extract-baked goods, candy  2. Choose your Foods Wisely   The following is a list of foods to avoid which are high in sodium:  Meats-Avoid all smoked, canned, salt cured, dried and kosher meat and fish as well as Anchovies   [employer] meats:Bologna, Liverwurst, Pastrami Canned meat or fish  Marinated herring Caviar    Pepperoni Corned Beef   Pizza Dried chipped beef  Salami Frozen breaded fish or meat Salt pork Frankfurters or hot dogs  Sardines Gefilte fish   Sausage Ham (boiled ham, Proscuitto Smoked butt    spiced ham)   Spam      TV Dinners Vegetables Canned vegetables (Regular) Relish Canned mushrooms  Sauerkraut Olives    Tomato juice Pickles  Bakery and Dessert Products Canned puddings  Cream pies Cheesecake   Decorated cakes Cookies  Beverages/Juices Tomato juice, regular  Gatorade   V-8 vegetable juice, regular  Breads and Cereals Biscuit mixes   Salted potato chips, corn chips, pretzels Bread stuffing mixes  Salted crackers and rolls Pancake and waffle mixes Self-rising flour  Seasonings Accent    Meat sauces Barbecue sauce  Meat tenderizer Catsup    Monosodium glutamate (MSG) Celery salt   Onion salt Chili sauce   Prepared mustard Garlic salt   Salt, seasoned salt, sea salt Gravy mixes   Soy sauce Horseradish   Steak sauce Ketchup  Tartar sauce Lite salt    Teriyaki sauce Marinade mixes   Worcestershire sauce  Others Baking  powder   Cocoa and cocoa mixes Baking soda   Commercial casserole mixes Candy-caramels, chocolate  Dehydrated soups    Bars, fudge,nougats  Instant rice and pasta mixes Canned broth or soup  Maraschino cherries Cheese, aged and processed cheese and cheese spreads

## 2018-06-18 NOTE — Progress Notes (Signed)
Cardiology Office Note    Date:  06/18/2018   ID:  Shawn Frye, DOB [birth year], MRN [id number]  PCP:  [name], MD  Cardiologist: Rozann Lesches, MD   EP: Dr. [name]  Chief Complaint  Patient presents with  . Follow-up    recent Emergency Dept Visit    History of Present Illness:    Shawn Frye is a 85 y.o. male with past medical history of chronic combined systolic and diastolic CHF (EF 03% by echo in 12/2017), 2nd Degree Heart Block (s/p St. Jude PPM placement in 07/2015), HLD, and AAA who presents to the office today for Emergency Department follow-up.   He was last examined by Dr. Domenic Polite in 04/2018 and reported having chronic dyspnea on exertion but denied any recent chest pain or palpitations. A conservative management strategy has been pursued in regards to his cardiomyopathy in the setting of his advanced age and he remains on Lasix 40 mg twice daily with BB or ACE-I/ARB unable to be added secondary to low BP.   In the interim, he presented to [facility] ED on 06/02/2018 for evaluation of a 15 pound weight gain and worsening dyspnea over the past month.In reviewing weights from his office visit in 04/2018, this had not significantly changed as it was 64.2 kg in 04/2018 and 65.6 kg while in the ED.  Labs showed that BNP was elevated to 1695 and creatinine had trended upwards from 0.91 in 01/2018 to 1.64. Hgb was stable at 9.9. IV Lasix 20mg  was administered and saturations remained appropriate with ambulation. No changes were made to his medications at discharge.   In talking with the patient today, he reports worsening dyspnea on exertion and at rest over the past 2 weeks. His caregiver reports that Lasix was reduced from 40 mg twice daily to 40 mg in AM/20mg  in PM around the onset of his symptoms due to worsening renal function as noted on lab work. He has been keeping a log of daily weights and this has gradually trended upwards from 141 -142 lbs two weeks ago to 147 lbs  on his home scales today. He is at 148 lbs on the office scales.  He denies any associated orthopnea, PND, or lower extremity edema. In reviewing sodium intake, his caregiver does report he consumed a Hungry Man frozen dinner this weekend and she has been giving him Pedialyte and fruit juices due to concerns for dehydration.   Past Medical History:  Diagnosis Date  . AAA (abdominal aortic aneurysm) ([place])   . Arthritis   . Chronic combined systolic (congestive) and diastolic (congestive) heart failure ([place])    a. 10/2017: echo showing a reduced EF of 25%, diffuse HK with akinesis of the apical myocardium, Grade 2 DD, mild AI, mild to moderate MR, and mild to moderate TR  . GERD (gastroesophageal reflux disease)   . Heart block AV second degree    St. Jude pacemaker May 2017 - Dr. [name]  . Infection of bladder   . Joint pain   . Symptomatic bradycardia     Past Surgical History:  Procedure Laterality Date  . BACK SURGERY    . BIOPSY  01/28/2018   Procedure: BIOPSY;  Surgeon: Rogene Houston, MD;  Location: [facility];  Service: Endoscopy;;  pre-pyloric  . CATARACT EXTRACTION W/ INTRAOCULAR LENS  IMPLANT, BILATERAL Bilateral   . EP IMPLANTABLE DEVICE N/A 10/02/2015   Procedure: Pacemaker Implant;  Surgeon: Evans Lance, MD;  Location:  [facility] INVASIVE CV LAB;  Service: Cardiovascular;  Laterality: N/A;  . ESOPHAGOGASTRODUODENOSCOPY N/A 01/28/2018   Procedure: ESOPHAGOGASTRODUODENOSCOPY (EGD);  Surgeon: Rogene Houston, MD;  Location: [facility];  Service: Endoscopy;  Laterality: N/A;  . EXCISIONAL HEMORRHOIDECTOMY    . HEMORROIDECTOMY    . INGUINAL HERNIA REPAIR Bilateral 1991  . INSERT / REPLACE / REMOVE PACEMAKER    . JOINT REPLACEMENT    . [place] SURGERY  2005  . TOTAL KNEE ARTHROPLASTY Bilateral 1998 -  2003  . TOTAL SHOULDER REPLACEMENT Left 1999   Partial  . TYMPANOSTOMY TUBE PLACEMENT Right     Current Medications: Outpatient Medications Prior to Visit    Medication Sig Dispense Refill  . ALPRAZolam (XANAX) 0.25 MG tablet Take 0.125-0.25 mg by mouth daily as needed for anxiety.    . feeding supplement, ENSURE ENLIVE, (ENSURE ENLIVE) LIQD Take 237 mLs by mouth 2 (two) times daily between meals.    Marland Kitchen loratadine (CLARITIN) 10 MG tablet Take 10 mg by mouth daily.    . pantoprazole (PROTONIX) 40 MG tablet Take 1 tablet (40 mg total) by mouth daily. 30 tablet 1  . potassium chloride SA (K-DUR,KLOR-CON) 20 MEQ tablet Take 2 tablets (40 mEq total) by mouth daily. 60 tablet 5  . furosemide (LASIX) 40 MG tablet Take 40 mg by mouth 2 (two) times daily. Pt reports taking 40mg  in the morning and 20mg  in the afternoon.    . ferrous sulfate 325 (65 FE) MG EC tablet Take 1 tablet by mouth daily.  1  . furosemide (LASIX) 40 MG tablet Take 1 tablet (40 mg total) by mouth 2 (two) times daily. (Patient not taking: Reported on 06/18/2018) 60 tablet 5   No facility-administered medications prior to visit.      Allergies:   Percocet [oxycodone-acetaminophen]; Noroxin [norfloxacin]; and Celebrex [celecoxib]   Social History   Socioeconomic History  . Marital status: Married    Spouse name: Not on file  . Number of children: Not on file  . Years of education: Not on file  . Highest education level: Not on file  Occupational History  . Not on file  Social Needs  . Financial resource strain: Not on file  . Food insecurity:    Worry: Not on file    Inability: Not on file  . Transportation needs:    Medical: Not on file    Non-medical: Not on file  Tobacco Use  . Smoking status: Former Smoker    Packs/day: 1.00    Years: 15.00    Pack years: 15.00    Types: Cigarettes    Last attempt to quit: 05/13/1950    Years since quitting: 68.1  . Smokeless tobacco: Never Used  Substance and Sexual Activity  . Alcohol use: No    Alcohol/week: 0.0 standard drinks  . Drug use: No  . Sexual activity: Never  Lifestyle  . Physical activity:    Days per week: Not on  file    Minutes per session: Not on file  . Stress: Not on file  Relationships  . Social connections:    Talks on phone: Not on file    Gets together: Not on file    Attends religious service: Not on file    Active member of club or organization: Not on file    Attends meetings of clubs or organizations: Not on file    Relationship status: Not on file  Other Topics Concern  . Not on file  Social History Narrative  . Not on file     Family History:  The patient's family history includes Cystic fibrosis in his paternal aunt; Heart disease in his father; Pneumonia in his mother.   Review of Systems:   Please see the history of present illness.     General:  No chills, fever, or night sweats. Positive for weight gain.   Cardiovascular:  No chest pain, , edema, orthopnea, palpitations, paroxysmal nocturnal dyspnea. Positive for dyspnea on exertion.  Dermatological: No rash, lesions/masses Respiratory: No cough, dyspnea Urologic: No hematuria, dysuria Abdominal:   No nausea, vomiting, diarrhea, bright red blood per rectum, melena, or hematemesis Neurologic:  No visual changes, wkns, changes in mental status. All other systems reviewed and are otherwise negative except as noted above.   Physical Exam:    VS:  BP 110/60   Pulse 88   Ht 5\' 7"  (1.702 m)   Wt 148 lb (67.1 kg)   SpO2 99%   BMI 23.18 kg/m    General: Well developed, elderly Caucasian male appearing in no acute distress. Head: Normocephalic, atraumatic, sclera non-icteric, no xanthomas, nares are without discharge.  Neck: No carotid bruits. JVD not elevated.  Lungs: Respirations regular and unlabored, mild rales along bases bilaterally. Heart: Regular rate and rhythm. No S3 or S4.  No murmur, no rubs, or gallops appreciated. Abdomen: Soft, non-tender, non-distended with normoactive bowel sounds. No hepatomegaly. No rebound/guarding. No obvious abdominal masses. Msk:  Strength and tone appear normal for age. No joint  deformities or effusions. Extremities: No clubbing or cyanosis. 1+ pitting edema bilaterally.  Distal pedal pulses are 2+ bilaterally. Neuro: Alert and oriented X 3. Moves all extremities spontaneously. No focal deficits noted. Psych:  Responds to questions appropriately with a normal affect. Skin: No rashes or lesions noted  Wt Readings from Last 3 Encounters:  06/18/18 148 lb (67.1 kg)  06/02/18 144 lb 9.6 oz (65.6 kg)  05/21/18 143 lb 9.6 oz (65.1 kg)     Studies/Labs Reviewed:   EKG:  EKG is not ordered today.    Recent Labs: 01/26/2018: Magnesium 2.0 06/02/2018: ALT 12; B Natriuretic Peptide 1,695.0; BUN 30; Creatinine, Ser 1.64; Hemoglobin 9.9; Platelets 163; Potassium 3.9; Sodium 138   Lipid Panel No results found for: CHOL, TRIG, HDL, CHOLHDL, VLDL, LDLCALC, LDLDIRECT  Additional studies/ records that were reviewed today include:   Echocardiogram: 12/2017 Study Conclusions  - Left ventricle: The cavity size was mildly dilated. Wall   thickness was normal. Systolic function was severely reduced. The   estimated ejection fraction was 20%. Diffuse hypokinesis.   Features are consistent with a pseudonormal left ventricular   filling pattern, with concomitant abnormal relaxation and   increased filling pressure (grade 2 diastolic dysfunction).   Doppler parameters are consistent with high ventricular filling   pressure. - Regional wall motion abnormality: Akinesis of the apical   myocardium. - Ventricular septum: Septal motion showed abnormal function and   dyssynergy. - Aortic valve: Moderately calcified annulus. Trileaflet. There was   mild regurgitation. - Aorta: Mild aortic root enlargement. - Mitral valve: Calcified annulus. There was moderate   regurgitation. - Left atrium: The atrium was severely dilated. - Right ventricle: Pacer wire or catheter noted in right ventricle.   Systolic function was mildly to moderately reduced. - Right atrium: The atrium was  moderately dilated. - Tricuspid valve: There was moderate-severe regurgitation. - Pulmonic valve: There was mild regurgitation. - Pulmonary arteries: PA peak pressure: 56 mm Hg (S). -  Inferior vena cava: The vessel was dilated. The respirophasic   diameter changes were blunted (< 50%), consistent with elevated   central venous pressure.  Assessment:    1. Chronic combined systolic and diastolic heart failure ([place])   2. Mobitz type 2 second degree atrioventricular block   3. AKI (acute kidney injury) ([place])   4. AAA (abdominal aortic aneurysm) without rupture ([place])      Plan:   In order of problems listed above:  1. Chronic Combined Systolic and Diastolic CHF - he has a known reduced EF of 20% by most recent echocardiogram in 12/2017 and aggressive measures have not been pursued in the setting of his advanced age. - He reports progressive dyspnea on exertion since Lasix was decreased approximately 2 weeks ago due to worsening renal function. He does have rales on examination along with lower extremity edema and BNP was elevated to 1695 at the time Lasix was reduced.  - Will increase Lasix back to 40 mg twice daily. I informed the patient's caregiver if his weight increases tomorrow despite taking this, she can give an additional dose. Encouraged reduction in sodium intake. They will call with weights on Monday. Suspect his kidney function will be variable in the setting of his fluid overload. If creatinine trends upwards significantly and still volume overloaded, may need to consider admission but he wants to avoid this and desires outpatient management if possible.   2. 2nd Degree AV Block - s/p St. Jude PPM placement in 07/2015. Device interrogation last month showed normal device function. Followed by Dr. [name].    3. AKI - baseline creatinine 0.8 - 0.9. Elevated to 1.64 on 06/02/2018. Likely that his poor cardiac output and hypotension is playing a role in this. Given his evidence of  volume overload by examination, will titrate Lasix as outlined above. Due for repeat labs with PCP next week per Caretaker's report and will ask for these to be faxed over once available.   4. AAA - at 5.6 cm by imaging in 06/2017. Followed by Vascular Surgery.    Medication Adjustments/Labs and Tests Ordered: Current medicines are reviewed at length with the patient today.  Concerns regarding medicines are outlined above.  Medication changes, Labs and Tests ordered today are listed in the Patient Instructions below. Patient Instructions   Medication Instructions:  Your physician has recommended you make the following change in your medication:  Increase Lasix to 40 mg two Times Daily   Call our office with weights on Monday   If you need a refill on your cardiac medications before your next appointment, please call your pharmacy.   Lab work: NONE  If you have labs (blood work) drawn today and your tests are completely normal, you will receive your results only by: Marland Kitchen MyChart Message (if you have MyChart) OR . A paper copy in the mail If you have any lab test that is abnormal or we need to change your treatment, we will call you to review the results.  Testing/Procedures: NONE   Follow-Up: At [facility], you and your health needs are our priority.  As part of our continuing mission to provide you with exceptional heart care, we have created designated Provider Care Teams.  These Care Teams include your primary Cardiologist (physician) and Advanced Practice Providers (APPs -  Physician Assistants and Nurse Practitioners) who all work together to provide you with the care you need, when you need it. You will need a follow up appointment in 3-4  weeks.  Please call our office 2 months in advance to schedule this appointment.  You may see Rozann Lesches, MD or one of the following Advanced Practice Providers on your designated Care Team:   Bernerd Pho, PA-C [facility]) . Ermalinda Barrios, PA-C ([place])  Any Other Special Instructions Will Be Listed Below (If Applicable). Thank you for choosing [facility]!    Signed, Erma Heritage, PA-C  06/18/2018 4:43 PM    [facility] [address] [place], [place] [address] Phone: [phone] Fax: [phone]

## 2018-06-22 ENCOUNTER — Telehealth: Payer: Self-pay | Admitting: Student

## 2018-06-22 NOTE — Telephone Encounter (Signed)
   Would continue with Lasix 40mg  BID for now as weight has remained stable. I believe he is scheduled for repeat labs with his PCP on Wednesday (please confirm - if not, order for at [facility] and add BNP). Pending renal function, may need to further increase Lasix dosing.   Signed, Erma Heritage, PA-C 06/22/2018, 2:04 PM Pager: [phone]

## 2018-06-22 NOTE — Telephone Encounter (Signed)
Patient's caregiver is calling in per [place] to give patient's weights. 2/7 147 lbs 2/8 146 lbs 2/9 n/a 2/10 147 lbs

## 2018-06-22 NOTE — Telephone Encounter (Signed)
I spoke with Lelon Frohlich, she states she will relay to [email or web address].patient has labs due this week

## 2018-06-22 NOTE — Telephone Encounter (Signed)
I will forward to provider. ?

## 2018-06-23 ENCOUNTER — Ambulatory Visit: Payer: Self-pay

## 2018-06-24 DIAGNOSIS — I509 Heart failure, unspecified: Secondary | ICD-10-CM | POA: Diagnosis not present

## 2018-06-24 DIAGNOSIS — N183 Chronic kidney disease, stage 3 (moderate): Secondary | ICD-10-CM | POA: Diagnosis not present

## 2018-06-25 DIAGNOSIS — I714 Abdominal aortic aneurysm, without rupture: Secondary | ICD-10-CM | POA: Diagnosis not present

## 2018-06-25 DIAGNOSIS — R531 Weakness: Secondary | ICD-10-CM | POA: Diagnosis not present

## 2018-06-25 DIAGNOSIS — I499 Cardiac arrhythmia, unspecified: Secondary | ICD-10-CM | POA: Diagnosis not present

## 2018-06-25 DIAGNOSIS — I420 Dilated cardiomyopathy: Secondary | ICD-10-CM | POA: Diagnosis not present

## 2018-06-25 DIAGNOSIS — M19011 Primary osteoarthritis, right shoulder: Secondary | ICD-10-CM | POA: Diagnosis not present

## 2018-06-25 DIAGNOSIS — J449 Chronic obstructive pulmonary disease, unspecified: Secondary | ICD-10-CM | POA: Diagnosis not present

## 2018-06-25 DIAGNOSIS — N400 Enlarged prostate without lower urinary tract symptoms: Secondary | ICD-10-CM | POA: Diagnosis not present

## 2018-06-25 DIAGNOSIS — R0602 Shortness of breath: Secondary | ICD-10-CM | POA: Diagnosis not present

## 2018-06-25 DIAGNOSIS — I509 Heart failure, unspecified: Secondary | ICD-10-CM | POA: Diagnosis not present

## 2018-06-25 DIAGNOSIS — K219 Gastro-esophageal reflux disease without esophagitis: Secondary | ICD-10-CM | POA: Diagnosis not present

## 2018-06-26 ENCOUNTER — Ambulatory Visit: Payer: Self-pay

## 2018-06-26 ENCOUNTER — Other Ambulatory Visit: Payer: Self-pay

## 2018-06-26 DIAGNOSIS — N400 Enlarged prostate without lower urinary tract symptoms: Secondary | ICD-10-CM | POA: Diagnosis not present

## 2018-06-26 DIAGNOSIS — K219 Gastro-esophageal reflux disease without esophagitis: Secondary | ICD-10-CM | POA: Diagnosis not present

## 2018-06-26 DIAGNOSIS — I714 Abdominal aortic aneurysm, without rupture: Secondary | ICD-10-CM | POA: Diagnosis not present

## 2018-06-26 DIAGNOSIS — I509 Heart failure, unspecified: Secondary | ICD-10-CM | POA: Diagnosis not present

## 2018-06-26 DIAGNOSIS — I499 Cardiac arrhythmia, unspecified: Secondary | ICD-10-CM | POA: Diagnosis not present

## 2018-06-26 DIAGNOSIS — I420 Dilated cardiomyopathy: Secondary | ICD-10-CM | POA: Diagnosis not present

## 2018-06-26 NOTE — Patient Outreach (Signed)
[place] [facility]) Care Management  06/26/2018  Zaccary Creech [facility] 12-14-1918 [phone]    Case closure due to Mr. Dillenburg transition to hospice. Caregiver confirmed start of services on 06/25/18.  PLAN Will update PCP. Will complete case closure.  [place] [phone]

## 2018-06-29 DIAGNOSIS — N400 Enlarged prostate without lower urinary tract symptoms: Secondary | ICD-10-CM | POA: Diagnosis not present

## 2018-06-29 DIAGNOSIS — K219 Gastro-esophageal reflux disease without esophagitis: Secondary | ICD-10-CM | POA: Diagnosis not present

## 2018-06-29 DIAGNOSIS — I420 Dilated cardiomyopathy: Secondary | ICD-10-CM | POA: Diagnosis not present

## 2018-06-29 DIAGNOSIS — I509 Heart failure, unspecified: Secondary | ICD-10-CM | POA: Diagnosis not present

## 2018-06-29 DIAGNOSIS — I499 Cardiac arrhythmia, unspecified: Secondary | ICD-10-CM | POA: Diagnosis not present

## 2018-06-29 DIAGNOSIS — I714 Abdominal aortic aneurysm, without rupture: Secondary | ICD-10-CM | POA: Diagnosis not present

## 2018-07-03 DIAGNOSIS — N400 Enlarged prostate without lower urinary tract symptoms: Secondary | ICD-10-CM | POA: Diagnosis not present

## 2018-07-03 DIAGNOSIS — I420 Dilated cardiomyopathy: Secondary | ICD-10-CM | POA: Diagnosis not present

## 2018-07-03 DIAGNOSIS — K219 Gastro-esophageal reflux disease without esophagitis: Secondary | ICD-10-CM | POA: Diagnosis not present

## 2018-07-03 DIAGNOSIS — I499 Cardiac arrhythmia, unspecified: Secondary | ICD-10-CM | POA: Diagnosis not present

## 2018-07-03 DIAGNOSIS — I714 Abdominal aortic aneurysm, without rupture: Secondary | ICD-10-CM | POA: Diagnosis not present

## 2018-07-03 DIAGNOSIS — I509 Heart failure, unspecified: Secondary | ICD-10-CM | POA: Diagnosis not present

## 2018-07-08 DIAGNOSIS — N400 Enlarged prostate without lower urinary tract symptoms: Secondary | ICD-10-CM | POA: Diagnosis not present

## 2018-07-08 DIAGNOSIS — I509 Heart failure, unspecified: Secondary | ICD-10-CM | POA: Diagnosis not present

## 2018-07-08 DIAGNOSIS — I420 Dilated cardiomyopathy: Secondary | ICD-10-CM | POA: Diagnosis not present

## 2018-07-08 DIAGNOSIS — I714 Abdominal aortic aneurysm, without rupture: Secondary | ICD-10-CM | POA: Diagnosis not present

## 2018-07-08 DIAGNOSIS — I499 Cardiac arrhythmia, unspecified: Secondary | ICD-10-CM | POA: Diagnosis not present

## 2018-07-08 DIAGNOSIS — K219 Gastro-esophageal reflux disease without esophagitis: Secondary | ICD-10-CM | POA: Diagnosis not present

## 2018-07-08 NOTE — Progress Notes (Signed)
Cardiology Office Note    Date:  07/09/2018   ID:  Shawn Frye, DOB 1918-06-13, MRN [id number]  PCP:  [name], MD  Cardiologist: Rozann Lesches, MD   EP: Dr. [name]  Chief Complaint  Patient presents with  . Follow-up    3 week visit    History of Present Illness:    Shawn Frye is a 85 y.o. male with past medical history of chronic combined systolic and diastolic CHF (EF 62% by echo in 12/2017), 2nd Degree Heart Block (s/p St. Jude PPM placement in 07/2015), HLD, and AAA who presents to the office today for 3-week follow-up.   He was last examined by myself on 06/18/2018 and reported worsening dyspnea at rest and on exertion over the past 2 weeks. He had been on Lasix 40 mg twice daily but this had been reduced by his PCP due to worsening renal function (creatinine elevated to 1.64 on 06/02/2018). With dose reduction, his symptoms did acutely worsen. He was consuming a high sodium diet including frequent frozen meals and fruit juices.  Lasix was further titrated back to 40 mg twice daily with repeat labs recommended for further evaluation. If symptoms did not improve and creatinine continued to worsen despite diuresis, it was thought he might require admission.   It appears he was evaluated by his PCP in the interim and transitioned to Hospice services as he did not wish to return to the hospital.   In talking with the patient and his caregiver today, he reports having "good and bad days". He feels weak today as he reports going to have his taxes done this morning and did not get his usual nap. He is still experiencing dyspnea with minimal activity. Denies any associated chest pain or palpitations. Hospice is following and coming to his home 2-3 times per week  He is using supplemental oxygen at night and as needed during the day. Denies any specific orthopnea or PND. They have questioned whether his diuretic needs to be further adjusted as weights have continued to trend upwards on  his home scales despite being transitioned from Lasix to Torsemide by his PCP over 2 weeks ago.   Past Medical History:  Diagnosis Date  . AAA (abdominal aortic aneurysm) ([place])   . Arthritis   . Chronic combined systolic (congestive) and diastolic (congestive) heart failure ([place])    a. 10/2017: echo showing a reduced EF of 25%, diffuse HK with akinesis of the apical myocardium, Grade 2 DD, mild AI, mild to moderate MR, and mild to moderate TR  . GERD (gastroesophageal reflux disease)   . Heart block AV second degree    St. Jude pacemaker May 2017 - Dr. [name]  . Infection of bladder   . Joint pain   . Symptomatic bradycardia     Past Surgical History:  Procedure Laterality Date  . BACK SURGERY    . BIOPSY  01/28/2018   Procedure: BIOPSY;  Surgeon: Rogene Houston, MD;  [facility];  Service: Endoscopy;;  pre-pyloric  . CATARACT EXTRACTION W/ INTRAOCULAR LENS  IMPLANT, BILATERAL Bilateral   . EP IMPLANTABLE DEVICE N/A 10/02/2015   Procedure: Pacemaker Implant;  Surgeon: Evans Lance, MD;  Location: [facility];  Service: Cardiovascular;  Laterality: N/A;  . ESOPHAGOGASTRODUODENOSCOPY N/A 01/28/2018   Procedure: ESOPHAGOGASTRODUODENOSCOPY (EGD);  Surgeon: Rogene Houston, MD;  [facility];  Service: Endoscopy;  Laterality: N/A;  . EXCISIONAL HEMORRHOIDECTOMY    .  HEMORROIDECTOMY    . INGUINAL HERNIA REPAIR Bilateral 1991  . INSERT / REPLACE / REMOVE PACEMAKER    . JOINT REPLACEMENT    . [place] SURGERY  2005  . TOTAL KNEE ARTHROPLASTY Bilateral 1998 -  2003  . TOTAL SHOULDER REPLACEMENT Left 1999   Partial  . TYMPANOSTOMY TUBE PLACEMENT Right     Current Medications: Outpatient Medications Prior to Visit  Medication Sig Dispense Refill  . ALPRAZolam (XANAX) 0.25 MG tablet Take 0.125-0.25 mg by mouth daily as needed for anxiety.    . feeding supplement, ENSURE ENLIVE, (ENSURE ENLIVE) LIQD Take 237 mLs by mouth 2 (two) times daily between  meals.    Marland Kitchen loratadine (CLARITIN) 10 MG tablet Take 10 mg by mouth daily.    . pantoprazole (PROTONIX) 40 MG tablet Take 1 tablet (40 mg total) by mouth daily. 30 tablet 1  . potassium chloride SA (K-DUR,KLOR-CON) 20 MEQ tablet Take 2 tablets (40 mEq total) by mouth daily. 60 tablet 5  . torsemide (DEMADEX) 20 MG tablet TAKE 2 TABLETS BY MOUTH ONCE DAILY IN THE MORNING. STOP LASIX    . furosemide (LASIX) 40 MG tablet Take 1 tablet (40 mg total) by mouth 2 (two) times daily. 60 tablet 11   No facility-administered medications prior to visit.      Allergies:   Percocet [oxycodone-acetaminophen]; Noroxin [norfloxacin]; and Celebrex [celecoxib]   Social History   Socioeconomic History  . Marital status: Married    Spouse name: Not on file  . Number of children: Not on file  . Years of education: Not on file  . Highest education level: Not on file  Occupational History  . Not on file  Social Needs  . Financial resource strain: Not on file  . Food insecurity:    Worry: Not on file    Inability: Not on file  . Transportation needs:    Medical: Not on file    Non-medical: Not on file  Tobacco Use  . Smoking status: Former Smoker    Packs/day: 1.00    Years: 15.00    Pack years: 15.00    Types: Cigarettes    Last attempt to quit: 05/13/1950    Years since quitting: 68.2  . Smokeless tobacco: Never Used  Substance and Sexual Activity  . Alcohol use: No    Alcohol/week: 0.0 standard drinks  . Drug use: No  . Sexual activity: Never  Lifestyle  . Physical activity:    Days per week: Not on file    Minutes per session: Not on file  . Stress: Not on file  Relationships  . Social connections:    Talks on phone: Not on file    Gets together: Not on file    Attends religious service: Not on file    Active member of club or organization: Not on file    Attends meetings of clubs or organizations: Not on file    Relationship status: Not on file  Other Topics Concern  . Not on file    Social History Narrative  . Not on file     Family History:  The patient's family history includes Cystic fibrosis in his paternal aunt; Heart disease in his father; Pneumonia in his mother.   Review of Systems:   Please see the history of present illness.     General:  No chills, fever, night sweats or weight changes.  Cardiovascular:  No chest pain, orthopnea, palpitations, paroxysmal nocturnal dyspnea. Positive for dyspnea on  exertion and edema.  Dermatological: No rash, lesions/masses Respiratory: No cough, dyspnea Urologic: No hematuria, dysuria Abdominal:   No nausea, vomiting, diarrhea, bright red blood per rectum, melena, or hematemesis Neurologic:  No visual changes, wkns, changes in mental status. All other systems reviewed and are otherwise negative except as noted above.   Physical Exam:    VS:  BP 98/64   Pulse 86   Ht 5\' 7"  (1.702 m)   Wt 152 lb 12.8 oz (69.3 kg)   SpO2 94%   BMI 23.93 kg/m    General: Well developed, elderly Caucasian male appearing in no acute distress. Head: Normocephalic, atraumatic, sclera non-icteric, no xanthomas, nares are without discharge.  Neck: No carotid bruits. JVD at 9cm.  Lungs: Respirations regular and unlabored, mild rales along bases bilaterally Heart: Regular rate and rhythm. No S3 or S4.  No murmur, no rubs, or gallops appreciated. Abdomen: Soft, non-tender, non-distended with normoactive bowel sounds. No hepatomegaly. No rebound/guarding. No obvious abdominal masses. Msk:  Strength and tone appear normal for age. No joint deformities or effusions. Extremities: No clubbing or cyanosis. 2+ pitting edema along LLE, 1+ RLE.  Distal pedal pulses are 2+ bilaterally. Neuro: Alert and oriented X 3. Moves all extremities spontaneously. No focal deficits noted. Psych:  Responds to questions appropriately with a normal affect. Skin: No rashes or lesions noted  Wt Readings from Last 3 Encounters:  07/09/18 152 lb 12.8 oz (69.3 kg)   06/18/18 148 lb (67.1 kg)  06/02/18 144 lb 9.6 oz (65.6 kg)      Studies/Labs Reviewed:   EKG:  EKG is not ordered today.    Recent Labs: 01/26/2018: Magnesium 2.0 06/02/2018: ALT 12; B Natriuretic Peptide 1,695.0; BUN 30; Creatinine, Ser 1.64; Hemoglobin 9.9; Platelets 163; Potassium 3.9; Sodium 138   Lipid Panel No results found for: CHOL, TRIG, HDL, CHOLHDL, VLDL, LDLCALC, LDLDIRECT  Additional studies/ records that were reviewed today include:   Echocardiogram: 12/2017 Study Conclusions  - Left ventricle: The cavity size was mildly dilated. Wall   thickness was normal. Systolic function was severely reduced. The   estimated ejection fraction was 20%. Diffuse hypokinesis.   Features are consistent with a pseudonormal left ventricular   filling pattern, with concomitant abnormal relaxation and   increased filling pressure (grade 2 diastolic dysfunction).   Doppler parameters are consistent with high ventricular filling   pressure. - Regional wall motion abnormality: Akinesis of the apical   myocardium. - Ventricular septum: Septal motion showed abnormal function and   dyssynergy. - Aortic valve: Moderately calcified annulus. Trileaflet. There was   mild regurgitation. - Aorta: Mild aortic root enlargement. - Mitral valve: Calcified annulus. There was moderate   regurgitation. - Left atrium: The atrium was severely dilated. - Right ventricle: Pacer wire or catheter noted in right ventricle.   Systolic function was mildly to moderately reduced. - Right atrium: The atrium was moderately dilated. - Tricuspid valve: There was moderate-severe regurgitation. - Pulmonic valve: There was mild regurgitation. - Pulmonary arteries: PA peak pressure: 56 mm Hg (S). - Inferior vena cava: The vessel was dilated. The respirophasic   diameter changes were blunted (< 50%), consistent with elevated   central venous pressure.  Assessment:    1. Acute on chronic combined systolic  (congestive) and diastolic (congestive) heart failure ([place])   2. Mobitz type 2 second degree atrioventricular block   3. AAA (abdominal aortic aneurysm) without rupture ([place])   4. CKD (chronic kidney disease) stage 3, GFR 30-59 ml/min (  [place])   5. Medication management      Plan:   In order of problems listed above:  1. Acute on Chronic Combined Systolic and Diastolic CHF - the patient has a known reduced EF of 20% by echo in 12/2017. He has been followed closely over the past several months for worsening dyspnea on exertion and lower extremity edema which led to further titration of Lasix but no improvement in his symptoms. He has since been transitioned to Torsemide and is also being followed by Hospice as he has declined further admissions. - Weight has continued to trend upwards on his home scales, currently at 155 lbs. Is up 4 pounds on the office scales since his last office visit. Given his worsening symptoms and associated weight gain, will further titrate Torsemide from 40mg  daily to 40mg  in AM/20mg  in PM. May require further titration to 40mg  BID if dyspnea does not improve or weight continues to trend upwards. He does have supplemental oxygen available at home as needed (saturations appropriate at 94% on RA today). Uses PRN Xanax as well. Not on BB therapy due to hypotension. No ACE-I/ARB/ARNI secondary to hypotension and AKI.   2. 2nd Degree AV Block - s/p St. Jude PPM placement in 07/2015. Followed by Dr. [name] and most recent interrogation in 04/2018 showed normal device function.  3. AAA - At 5.6 cm by imaging in 06/2017 which was followed by Vascular Surgery. Would not anticipate further monitoring at this time as he has transitioned to hospice services.  4. Stage 3 CKD - Creatinine was trending upwards to 1.64 by labs in 05/2018. Repeat labs were not pursued at the time of his last PCP visit due to being transitioned to hospice care. The patient is curious about where his renal  function is at currently. While this will not necessarily change the plan in regards to his diuretics, he may need dose adjustment of potassium supplementation given recent transition to Torsemide.   Medication Adjustments/Labs and Tests Ordered: Current medicines are reviewed at length with the patient today.  Concerns regarding medicines are outlined above.  Medication changes, Labs and Tests ordered today are listed in the Patient Instructions below. Patient Instructions   Medication Instructions:  Your physician has recommended you make the following change in your medication:  Increase Torsemide to 40 mg in the morning and 20 mg in the evening.   If you need a refill on your cardiac medications before your next appointment, please call your pharmacy.   Lab work: Your physician recommends that you return for lab work in: today   If you have labs (blood work) drawn today and your tests are completely normal, you will receive your results only by: Marland Kitchen MyChart Message (if you have MyChart) OR . A paper copy in the mail If you have any lab test that is abnormal or we need to change your treatment, we will call you to review the results.  Testing/Procedures: NONE   Follow-Up: At [facility], you and your health needs are our priority.  As part of our continuing mission to provide you with exceptional heart care, we have created designated Provider Care Teams.  These Care Teams include your primary Cardiologist (physician) and Advanced Practice Providers (APPs -  Physician Assistants and Nurse Practitioners) who all work together to provide you with the care you need, when you need it. You will need a follow up appointment in 1 months.  Please call our office 2 months in advance to schedule  this appointment.  You may see Rozann Lesches, MD or one of the following Advanced Practice Providers on your designated Care Team:   Bernerd Pho, PA-C [facility]) . Ermalinda Barrios, PA-C  ([place])  Any Other Special Instructions Will Be Listed Below (If Applicable). Thank you for choosing [facility]!     Signed, Erma Heritage, PA-C  07/09/2018 1:52 PM    [facility] [address] [place], [place] [address] Phone: [phone] Fax: [phone] ,mso

## 2018-07-09 ENCOUNTER — Telehealth: Payer: Self-pay | Admitting: *Deleted

## 2018-07-09 ENCOUNTER — Ambulatory Visit (INDEPENDENT_AMBULATORY_CARE_PROVIDER_SITE_OTHER): Payer: Medicare Other | Admitting: Student

## 2018-07-09 ENCOUNTER — Other Ambulatory Visit (HOSPITAL_COMMUNITY)
Admission: RE | Admit: 2018-07-09 | Discharge: 2018-07-09 | Disposition: A | Discharge status: Home / Self Care | Payer: Medicare Other | Source: Ambulatory Visit | Attending: Student | Admitting: Student

## 2018-07-09 ENCOUNTER — Encounter: Payer: Self-pay | Admitting: Student

## 2018-07-09 VITALS — BP 98/64 | HR 86 | Ht 67.0 in | Wt 152.8 lb

## 2018-07-09 DIAGNOSIS — N183 Chronic kidney disease, stage 3 unspecified: Secondary | ICD-10-CM

## 2018-07-09 DIAGNOSIS — Z79899 Other long term (current) drug therapy: Secondary | ICD-10-CM | POA: Diagnosis not present

## 2018-07-09 DIAGNOSIS — I5043 Acute on chronic combined systolic (congestive) and diastolic (congestive) heart failure: Secondary | ICD-10-CM | POA: Diagnosis not present

## 2018-07-09 DIAGNOSIS — I714 Abdominal aortic aneurysm, without rupture, unspecified: Secondary | ICD-10-CM

## 2018-07-09 DIAGNOSIS — I5042 Chronic combined systolic (congestive) and diastolic (congestive) heart failure: Secondary | ICD-10-CM | POA: Insufficient documentation

## 2018-07-09 DIAGNOSIS — I441 Atrioventricular block, second degree: Secondary | ICD-10-CM | POA: Diagnosis not present

## 2018-07-09 LAB — BASIC METABOLIC PANEL
ANION GAP: 10 (ref 5–15)
BUN: 30 mg/dL — ABNORMAL HIGH (ref 8–23)
CO2: 25 mmol/L (ref 22–32)
Calcium: 8.5 mg/dL — ABNORMAL LOW (ref 8.9–10.3)
Chloride: 99 mmol/L (ref 98–111)
Creatinine, Ser: 1.38 mg/dL — ABNORMAL HIGH (ref 0.61–1.24)
GFR calc Af Amer: 49 mL/min — ABNORMAL LOW (ref >60)
GFR, EST NON AFRICAN AMERICAN: 43 mL/min — AB (ref >60)
GLUCOSE: 113 mg/dL — AB (ref 70–99)
Potassium: 3.3 mmol/L — ABNORMAL LOW (ref 3.5–5.1)
Sodium: 134 mmol/L — ABNORMAL LOW (ref 135–145)

## 2018-07-09 MED ORDER — TORSEMIDE 20 MG PO TABS: ORAL_TABLET | ORAL | 11 refills | Status: DC

## 2018-07-09 NOTE — Telephone Encounter (Signed)
Called patient with test results. No answer. Left message to call back.  

## 2018-07-09 NOTE — Telephone Encounter (Signed)
-----   Message from Erma Heritage, [place] sent at 07/09/2018  3:29 PM EST ----- Please let the patient know his renal function has actually improved as creatinine was previously 1.64, now at 1.38. Does have mild hyponatremia which is common with diuretic use. His K+ was low so would recommend increasing K-dur to 60 mEq daily. Please forward a copy of results to [name], MD. Thank you.

## 2018-07-09 NOTE — Patient Instructions (Addendum)
  Medication Instructions:  Your physician has recommended you make the following change in your medication:  Increase Torsemide to 40 mg in the morning and 20 mg in the evening.   If you need a refill on your cardiac medications before your next appointment, please call your pharmacy.   Lab work: Your physician recommends that you return for lab work in: today   If you have labs (blood work) drawn today and your tests are completely normal, you will receive your results only by: Marland Kitchen MyChart Message (if you have MyChart) OR . A paper copy in the mail If you have any lab test that is abnormal or we need to change your treatment, we will call you to review the results.  Testing/Procedures: NONE   Follow-Up: At [facility], you and your health needs are our priority.  As part of our continuing mission to provide you with exceptional heart care, we have created designated Provider Care Teams.  These Care Teams include your primary Cardiologist (physician) and Advanced Practice Providers (APPs -  Physician Assistants and Nurse Practitioners) who all work together to provide you with the care you need, when you need it. You will need a follow up appointment in 1 months.  Please call our office 2 months in advance to schedule this appointment.  You may see Rozann Lesches, MD or one of the following Advanced Practice Providers on your designated Care Team:   Bernerd Pho, PA-C [facility]) . Ermalinda Barrios, PA-C ([place])  Any Other Special Instructions Will Be Listed Below (If Applicable). Thank you for choosing [facility]!

## 2018-07-10 ENCOUNTER — Telehealth: Payer: Self-pay | Admitting: *Deleted

## 2018-07-10 DIAGNOSIS — I499 Cardiac arrhythmia, unspecified: Secondary | ICD-10-CM | POA: Diagnosis not present

## 2018-07-10 DIAGNOSIS — I509 Heart failure, unspecified: Secondary | ICD-10-CM | POA: Diagnosis not present

## 2018-07-10 DIAGNOSIS — I714 Abdominal aortic aneurysm, without rupture: Secondary | ICD-10-CM | POA: Diagnosis not present

## 2018-07-10 DIAGNOSIS — K219 Gastro-esophageal reflux disease without esophagitis: Secondary | ICD-10-CM | POA: Diagnosis not present

## 2018-07-10 DIAGNOSIS — N400 Enlarged prostate without lower urinary tract symptoms: Secondary | ICD-10-CM | POA: Diagnosis not present

## 2018-07-10 DIAGNOSIS — I420 Dilated cardiomyopathy: Secondary | ICD-10-CM | POA: Diagnosis not present

## 2018-07-10 MED ORDER — POTASSIUM CHLORIDE CRYS ER 20 MEQ PO TBCR: 60.0000 meq | EXTENDED_RELEASE_TABLET | Freq: Every day | ORAL | 6 refills | Status: DC

## 2018-07-10 NOTE — Telephone Encounter (Signed)
-----   Message from Erma Heritage, [place] sent at 07/09/2018  3:29 PM EST ----- Please let the patient know his renal function has actually improved as creatinine was previously 1.64, now at 1.38. Does have mild hyponatremia which is common with diuretic use. His K+ was low so would recommend increasing K-dur to 60 mEq daily. Please forward a copy of results to [name], MD. Thank you.

## 2018-07-12 DIAGNOSIS — I714 Abdominal aortic aneurysm, without rupture: Secondary | ICD-10-CM | POA: Diagnosis not present

## 2018-07-12 DIAGNOSIS — K219 Gastro-esophageal reflux disease without esophagitis: Secondary | ICD-10-CM | POA: Diagnosis not present

## 2018-07-12 DIAGNOSIS — R531 Weakness: Secondary | ICD-10-CM | POA: Diagnosis not present

## 2018-07-12 DIAGNOSIS — J449 Chronic obstructive pulmonary disease, unspecified: Secondary | ICD-10-CM | POA: Diagnosis not present

## 2018-07-12 DIAGNOSIS — I499 Cardiac arrhythmia, unspecified: Secondary | ICD-10-CM | POA: Diagnosis not present

## 2018-07-12 DIAGNOSIS — I509 Heart failure, unspecified: Secondary | ICD-10-CM | POA: Diagnosis not present

## 2018-07-12 DIAGNOSIS — M19011 Primary osteoarthritis, right shoulder: Secondary | ICD-10-CM | POA: Diagnosis not present

## 2018-07-12 DIAGNOSIS — R0602 Shortness of breath: Secondary | ICD-10-CM | POA: Diagnosis not present

## 2018-07-12 DIAGNOSIS — I420 Dilated cardiomyopathy: Secondary | ICD-10-CM | POA: Diagnosis not present

## 2018-07-12 DIAGNOSIS — N400 Enlarged prostate without lower urinary tract symptoms: Secondary | ICD-10-CM | POA: Diagnosis not present

## 2018-07-14 DIAGNOSIS — N400 Enlarged prostate without lower urinary tract symptoms: Secondary | ICD-10-CM | POA: Diagnosis not present

## 2018-07-14 DIAGNOSIS — I499 Cardiac arrhythmia, unspecified: Secondary | ICD-10-CM | POA: Diagnosis not present

## 2018-07-14 DIAGNOSIS — I509 Heart failure, unspecified: Secondary | ICD-10-CM | POA: Diagnosis not present

## 2018-07-14 DIAGNOSIS — I420 Dilated cardiomyopathy: Secondary | ICD-10-CM | POA: Diagnosis not present

## 2018-07-14 DIAGNOSIS — I714 Abdominal aortic aneurysm, without rupture: Secondary | ICD-10-CM | POA: Diagnosis not present

## 2018-07-14 DIAGNOSIS — K219 Gastro-esophageal reflux disease without esophagitis: Secondary | ICD-10-CM | POA: Diagnosis not present

## 2018-07-16 DIAGNOSIS — I509 Heart failure, unspecified: Secondary | ICD-10-CM | POA: Diagnosis not present

## 2018-07-16 DIAGNOSIS — I714 Abdominal aortic aneurysm, without rupture: Secondary | ICD-10-CM | POA: Diagnosis not present

## 2018-07-16 DIAGNOSIS — K219 Gastro-esophageal reflux disease without esophagitis: Secondary | ICD-10-CM | POA: Diagnosis not present

## 2018-07-16 DIAGNOSIS — N400 Enlarged prostate without lower urinary tract symptoms: Secondary | ICD-10-CM | POA: Diagnosis not present

## 2018-07-16 DIAGNOSIS — I499 Cardiac arrhythmia, unspecified: Secondary | ICD-10-CM | POA: Diagnosis not present

## 2018-07-16 DIAGNOSIS — I420 Dilated cardiomyopathy: Secondary | ICD-10-CM | POA: Diagnosis not present

## 2018-07-20 DIAGNOSIS — I420 Dilated cardiomyopathy: Secondary | ICD-10-CM | POA: Diagnosis not present

## 2018-07-20 DIAGNOSIS — I714 Abdominal aortic aneurysm, without rupture: Secondary | ICD-10-CM | POA: Diagnosis not present

## 2018-07-20 DIAGNOSIS — K219 Gastro-esophageal reflux disease without esophagitis: Secondary | ICD-10-CM | POA: Diagnosis not present

## 2018-07-20 DIAGNOSIS — I499 Cardiac arrhythmia, unspecified: Secondary | ICD-10-CM | POA: Diagnosis not present

## 2018-07-20 DIAGNOSIS — N400 Enlarged prostate without lower urinary tract symptoms: Secondary | ICD-10-CM | POA: Diagnosis not present

## 2018-07-20 DIAGNOSIS — I509 Heart failure, unspecified: Secondary | ICD-10-CM | POA: Diagnosis not present

## 2018-07-23 DIAGNOSIS — N400 Enlarged prostate without lower urinary tract symptoms: Secondary | ICD-10-CM | POA: Diagnosis not present

## 2018-07-23 DIAGNOSIS — I509 Heart failure, unspecified: Secondary | ICD-10-CM | POA: Diagnosis not present

## 2018-07-23 DIAGNOSIS — I499 Cardiac arrhythmia, unspecified: Secondary | ICD-10-CM | POA: Diagnosis not present

## 2018-07-23 DIAGNOSIS — K219 Gastro-esophageal reflux disease without esophagitis: Secondary | ICD-10-CM | POA: Diagnosis not present

## 2018-07-23 DIAGNOSIS — I714 Abdominal aortic aneurysm, without rupture: Secondary | ICD-10-CM | POA: Diagnosis not present

## 2018-07-23 DIAGNOSIS — I420 Dilated cardiomyopathy: Secondary | ICD-10-CM | POA: Diagnosis not present

## 2018-07-28 DIAGNOSIS — I509 Heart failure, unspecified: Secondary | ICD-10-CM | POA: Diagnosis not present

## 2018-07-28 DIAGNOSIS — I499 Cardiac arrhythmia, unspecified: Secondary | ICD-10-CM | POA: Diagnosis not present

## 2018-07-28 DIAGNOSIS — I714 Abdominal aortic aneurysm, without rupture: Secondary | ICD-10-CM | POA: Diagnosis not present

## 2018-07-28 DIAGNOSIS — I420 Dilated cardiomyopathy: Secondary | ICD-10-CM | POA: Diagnosis not present

## 2018-07-28 DIAGNOSIS — N400 Enlarged prostate without lower urinary tract symptoms: Secondary | ICD-10-CM | POA: Diagnosis not present

## 2018-07-28 DIAGNOSIS — K219 Gastro-esophageal reflux disease without esophagitis: Secondary | ICD-10-CM | POA: Diagnosis not present

## 2018-07-29 DIAGNOSIS — I509 Heart failure, unspecified: Secondary | ICD-10-CM | POA: Diagnosis not present

## 2018-07-29 DIAGNOSIS — N400 Enlarged prostate without lower urinary tract symptoms: Secondary | ICD-10-CM | POA: Diagnosis not present

## 2018-07-29 DIAGNOSIS — K219 Gastro-esophageal reflux disease without esophagitis: Secondary | ICD-10-CM | POA: Diagnosis not present

## 2018-07-29 DIAGNOSIS — I420 Dilated cardiomyopathy: Secondary | ICD-10-CM | POA: Diagnosis not present

## 2018-07-29 DIAGNOSIS — I499 Cardiac arrhythmia, unspecified: Secondary | ICD-10-CM | POA: Diagnosis not present

## 2018-07-29 DIAGNOSIS — I714 Abdominal aortic aneurysm, without rupture: Secondary | ICD-10-CM | POA: Diagnosis not present

## 2018-07-31 ENCOUNTER — Ambulatory Visit (INDEPENDENT_AMBULATORY_CARE_PROVIDER_SITE_OTHER): Admitting: *Deleted

## 2018-07-31 ENCOUNTER — Other Ambulatory Visit: Payer: Self-pay

## 2018-07-31 DIAGNOSIS — I509 Heart failure, unspecified: Secondary | ICD-10-CM | POA: Diagnosis not present

## 2018-07-31 DIAGNOSIS — N400 Enlarged prostate without lower urinary tract symptoms: Secondary | ICD-10-CM | POA: Diagnosis not present

## 2018-07-31 DIAGNOSIS — I714 Abdominal aortic aneurysm, without rupture: Secondary | ICD-10-CM | POA: Diagnosis not present

## 2018-07-31 DIAGNOSIS — I441 Atrioventricular block, second degree: Secondary | ICD-10-CM

## 2018-07-31 DIAGNOSIS — I420 Dilated cardiomyopathy: Secondary | ICD-10-CM | POA: Diagnosis not present

## 2018-07-31 DIAGNOSIS — I499 Cardiac arrhythmia, unspecified: Secondary | ICD-10-CM | POA: Diagnosis not present

## 2018-07-31 DIAGNOSIS — K219 Gastro-esophageal reflux disease without esophagitis: Secondary | ICD-10-CM | POA: Diagnosis not present

## 2018-07-31 LAB — CUP PACEART REMOTE DEVICE CHECK
Battery Remaining Longevity: 97 mo
Battery Remaining Percentage: 95.5 %
Brady Statistic AP VS Percent: 1 %
Brady Statistic AS VP Percent: 98 %
Brady Statistic AS VS Percent: 1 %
Brady Statistic RA Percent Paced: 1 %
Date Time Interrogation Session: 20200320060016
Implantable Lead Implant Date: 20170522
Implantable Lead Location: 753859
Implantable Lead Location: 753860
Implantable Pulse Generator Implant Date: 20170522
Lead Channel Impedance Value: 360 Ohm
Lead Channel Pacing Threshold Amplitude: 0.75 V
Lead Channel Pacing Threshold Pulse Width: 0.5 ms
Lead Channel Sensing Intrinsic Amplitude: 0.6 mV
Lead Channel Sensing Intrinsic Amplitude: 4.2 mV
Lead Channel Setting Pacing Pulse Width: 0.5 ms
MDC IDC LEAD IMPLANT DT: 20170522
MDC IDC MSMT BATTERY VOLTAGE: 2.98 V
MDC IDC MSMT LEADCHNL RA PACING THRESHOLD AMPLITUDE: 0.75 V
MDC IDC MSMT LEADCHNL RV IMPEDANCE VALUE: 400 Ohm
MDC IDC MSMT LEADCHNL RV PACING THRESHOLD PULSEWIDTH: 0.5 ms
MDC IDC SET LEADCHNL RA PACING AMPLITUDE: 2 V
MDC IDC SET LEADCHNL RV PACING AMPLITUDE: 2.5 V
MDC IDC SET LEADCHNL RV SENSING SENSITIVITY: 2 mV
MDC IDC STAT BRADY AP VP PERCENT: 1 %
MDC IDC STAT BRADY RV PERCENT PACED: 99 %
Pulse Gen Model: 2272
Pulse Gen Serial Number: 7899864

## 2018-08-03 ENCOUNTER — Telehealth: Payer: Self-pay | Admitting: Physician Assistant

## 2018-08-03 NOTE — Telephone Encounter (Signed)
I called patient about his upcoming appointment with Dr. Domenic Polite 08/06/2018 because of the coronavirus outbreak.  Patient is 50 and under hospice care.  His weights have been stable and swelling down.  He is happy to reschedule and will call us if he has any problems.  Please schedule him to see Dr. Domenic Polite in 3 to 4 months.

## 2018-08-04 DIAGNOSIS — I509 Heart failure, unspecified: Secondary | ICD-10-CM | POA: Diagnosis not present

## 2018-08-04 DIAGNOSIS — N400 Enlarged prostate without lower urinary tract symptoms: Secondary | ICD-10-CM | POA: Diagnosis not present

## 2018-08-04 DIAGNOSIS — I420 Dilated cardiomyopathy: Secondary | ICD-10-CM | POA: Diagnosis not present

## 2018-08-04 DIAGNOSIS — I714 Abdominal aortic aneurysm, without rupture: Secondary | ICD-10-CM | POA: Diagnosis not present

## 2018-08-04 DIAGNOSIS — I499 Cardiac arrhythmia, unspecified: Secondary | ICD-10-CM | POA: Diagnosis not present

## 2018-08-04 DIAGNOSIS — K219 Gastro-esophageal reflux disease without esophagitis: Secondary | ICD-10-CM | POA: Diagnosis not present

## 2018-08-06 ENCOUNTER — Ambulatory Visit: Payer: Medicare Other | Admitting: Cardiology

## 2018-08-07 ENCOUNTER — Encounter: Payer: Self-pay | Admitting: Cardiology

## 2018-08-07 NOTE — Progress Notes (Signed)
Remote pacemaker transmission.   

## 2018-08-10 DIAGNOSIS — K219 Gastro-esophageal reflux disease without esophagitis: Secondary | ICD-10-CM | POA: Diagnosis not present

## 2018-08-10 DIAGNOSIS — N183 Chronic kidney disease, stage 3 (moderate): Secondary | ICD-10-CM | POA: Diagnosis not present

## 2018-08-10 DIAGNOSIS — N400 Enlarged prostate without lower urinary tract symptoms: Secondary | ICD-10-CM | POA: Diagnosis not present

## 2018-08-10 DIAGNOSIS — R197 Diarrhea, unspecified: Secondary | ICD-10-CM | POA: Diagnosis not present

## 2018-08-10 DIAGNOSIS — F411 Generalized anxiety disorder: Secondary | ICD-10-CM | POA: Diagnosis not present

## 2018-08-10 DIAGNOSIS — D631 Anemia in chronic kidney disease: Secondary | ICD-10-CM | POA: Diagnosis not present

## 2018-08-10 DIAGNOSIS — I714 Abdominal aortic aneurysm, without rupture: Secondary | ICD-10-CM | POA: Diagnosis not present

## 2018-08-10 DIAGNOSIS — I420 Dilated cardiomyopathy: Secondary | ICD-10-CM | POA: Diagnosis not present

## 2018-08-10 DIAGNOSIS — I509 Heart failure, unspecified: Secondary | ICD-10-CM | POA: Diagnosis not present

## 2018-08-10 DIAGNOSIS — I499 Cardiac arrhythmia, unspecified: Secondary | ICD-10-CM | POA: Diagnosis not present

## 2018-08-10 DIAGNOSIS — I5042 Chronic combined systolic (congestive) and diastolic (congestive) heart failure: Secondary | ICD-10-CM | POA: Diagnosis not present

## 2018-08-10 DIAGNOSIS — I455 Other specified heart block: Secondary | ICD-10-CM | POA: Diagnosis not present

## 2018-08-10 DIAGNOSIS — I719 Aortic aneurysm of unspecified site, without rupture: Secondary | ICD-10-CM | POA: Diagnosis not present

## 2018-08-12 DIAGNOSIS — N400 Enlarged prostate without lower urinary tract symptoms: Secondary | ICD-10-CM | POA: Diagnosis not present

## 2018-08-12 DIAGNOSIS — R531 Weakness: Secondary | ICD-10-CM | POA: Diagnosis not present

## 2018-08-12 DIAGNOSIS — I420 Dilated cardiomyopathy: Secondary | ICD-10-CM | POA: Diagnosis not present

## 2018-08-12 DIAGNOSIS — K219 Gastro-esophageal reflux disease without esophagitis: Secondary | ICD-10-CM | POA: Diagnosis not present

## 2018-08-12 DIAGNOSIS — I509 Heart failure, unspecified: Secondary | ICD-10-CM | POA: Diagnosis not present

## 2018-08-12 DIAGNOSIS — I714 Abdominal aortic aneurysm, without rupture: Secondary | ICD-10-CM | POA: Diagnosis not present

## 2018-08-12 DIAGNOSIS — R0602 Shortness of breath: Secondary | ICD-10-CM | POA: Diagnosis not present

## 2018-08-12 DIAGNOSIS — I499 Cardiac arrhythmia, unspecified: Secondary | ICD-10-CM | POA: Diagnosis not present

## 2018-08-12 DIAGNOSIS — J449 Chronic obstructive pulmonary disease, unspecified: Secondary | ICD-10-CM | POA: Diagnosis not present

## 2018-08-12 DIAGNOSIS — M19011 Primary osteoarthritis, right shoulder: Secondary | ICD-10-CM | POA: Diagnosis not present

## 2018-08-17 DIAGNOSIS — I509 Heart failure, unspecified: Secondary | ICD-10-CM | POA: Diagnosis not present

## 2018-08-17 DIAGNOSIS — I420 Dilated cardiomyopathy: Secondary | ICD-10-CM | POA: Diagnosis not present

## 2018-08-17 DIAGNOSIS — K219 Gastro-esophageal reflux disease without esophagitis: Secondary | ICD-10-CM | POA: Diagnosis not present

## 2018-08-17 DIAGNOSIS — I714 Abdominal aortic aneurysm, without rupture: Secondary | ICD-10-CM | POA: Diagnosis not present

## 2018-08-17 DIAGNOSIS — I499 Cardiac arrhythmia, unspecified: Secondary | ICD-10-CM | POA: Diagnosis not present

## 2018-08-17 DIAGNOSIS — N400 Enlarged prostate without lower urinary tract symptoms: Secondary | ICD-10-CM | POA: Diagnosis not present

## 2018-08-25 DIAGNOSIS — K219 Gastro-esophageal reflux disease without esophagitis: Secondary | ICD-10-CM | POA: Diagnosis not present

## 2018-08-25 DIAGNOSIS — I509 Heart failure, unspecified: Secondary | ICD-10-CM | POA: Diagnosis not present

## 2018-08-25 DIAGNOSIS — I499 Cardiac arrhythmia, unspecified: Secondary | ICD-10-CM | POA: Diagnosis not present

## 2018-08-25 DIAGNOSIS — I714 Abdominal aortic aneurysm, without rupture: Secondary | ICD-10-CM | POA: Diagnosis not present

## 2018-08-25 DIAGNOSIS — N400 Enlarged prostate without lower urinary tract symptoms: Secondary | ICD-10-CM | POA: Diagnosis not present

## 2018-08-25 DIAGNOSIS — I420 Dilated cardiomyopathy: Secondary | ICD-10-CM | POA: Diagnosis not present

## 2018-08-31 DIAGNOSIS — I509 Heart failure, unspecified: Secondary | ICD-10-CM | POA: Diagnosis not present

## 2018-08-31 DIAGNOSIS — I714 Abdominal aortic aneurysm, without rupture: Secondary | ICD-10-CM | POA: Diagnosis not present

## 2018-08-31 DIAGNOSIS — I420 Dilated cardiomyopathy: Secondary | ICD-10-CM | POA: Diagnosis not present

## 2018-08-31 DIAGNOSIS — K219 Gastro-esophageal reflux disease without esophagitis: Secondary | ICD-10-CM | POA: Diagnosis not present

## 2018-08-31 DIAGNOSIS — I499 Cardiac arrhythmia, unspecified: Secondary | ICD-10-CM | POA: Diagnosis not present

## 2018-08-31 DIAGNOSIS — N400 Enlarged prostate without lower urinary tract symptoms: Secondary | ICD-10-CM | POA: Diagnosis not present

## 2018-09-01 DIAGNOSIS — I420 Dilated cardiomyopathy: Secondary | ICD-10-CM | POA: Diagnosis not present

## 2018-09-01 DIAGNOSIS — I714 Abdominal aortic aneurysm, without rupture: Secondary | ICD-10-CM | POA: Diagnosis not present

## 2018-09-01 DIAGNOSIS — I499 Cardiac arrhythmia, unspecified: Secondary | ICD-10-CM | POA: Diagnosis not present

## 2018-09-01 DIAGNOSIS — K219 Gastro-esophageal reflux disease without esophagitis: Secondary | ICD-10-CM | POA: Diagnosis not present

## 2018-09-01 DIAGNOSIS — N400 Enlarged prostate without lower urinary tract symptoms: Secondary | ICD-10-CM | POA: Diagnosis not present

## 2018-09-01 DIAGNOSIS — I509 Heart failure, unspecified: Secondary | ICD-10-CM | POA: Diagnosis not present

## 2018-09-08 DIAGNOSIS — I509 Heart failure, unspecified: Secondary | ICD-10-CM | POA: Diagnosis not present

## 2018-09-08 DIAGNOSIS — K219 Gastro-esophageal reflux disease without esophagitis: Secondary | ICD-10-CM | POA: Diagnosis not present

## 2018-09-08 DIAGNOSIS — N400 Enlarged prostate without lower urinary tract symptoms: Secondary | ICD-10-CM | POA: Diagnosis not present

## 2018-09-08 DIAGNOSIS — I499 Cardiac arrhythmia, unspecified: Secondary | ICD-10-CM | POA: Diagnosis not present

## 2018-09-08 DIAGNOSIS — I420 Dilated cardiomyopathy: Secondary | ICD-10-CM | POA: Diagnosis not present

## 2018-09-08 DIAGNOSIS — I714 Abdominal aortic aneurysm, without rupture: Secondary | ICD-10-CM | POA: Diagnosis not present

## 2018-09-11 DIAGNOSIS — I714 Abdominal aortic aneurysm, without rupture: Secondary | ICD-10-CM | POA: Diagnosis not present

## 2018-09-11 DIAGNOSIS — R531 Weakness: Secondary | ICD-10-CM | POA: Diagnosis not present

## 2018-09-11 DIAGNOSIS — J449 Chronic obstructive pulmonary disease, unspecified: Secondary | ICD-10-CM | POA: Diagnosis not present

## 2018-09-11 DIAGNOSIS — M19011 Primary osteoarthritis, right shoulder: Secondary | ICD-10-CM | POA: Diagnosis not present

## 2018-09-11 DIAGNOSIS — R0602 Shortness of breath: Secondary | ICD-10-CM | POA: Diagnosis not present

## 2018-09-11 DIAGNOSIS — N400 Enlarged prostate without lower urinary tract symptoms: Secondary | ICD-10-CM | POA: Diagnosis not present

## 2018-09-11 DIAGNOSIS — I420 Dilated cardiomyopathy: Secondary | ICD-10-CM | POA: Diagnosis not present

## 2018-09-11 DIAGNOSIS — K219 Gastro-esophageal reflux disease without esophagitis: Secondary | ICD-10-CM | POA: Diagnosis not present

## 2018-09-11 DIAGNOSIS — I509 Heart failure, unspecified: Secondary | ICD-10-CM | POA: Diagnosis not present

## 2018-09-11 DIAGNOSIS — I499 Cardiac arrhythmia, unspecified: Secondary | ICD-10-CM | POA: Diagnosis not present

## 2018-09-15 DIAGNOSIS — I714 Abdominal aortic aneurysm, without rupture: Secondary | ICD-10-CM | POA: Diagnosis not present

## 2018-09-15 DIAGNOSIS — I499 Cardiac arrhythmia, unspecified: Secondary | ICD-10-CM | POA: Diagnosis not present

## 2018-09-15 DIAGNOSIS — I420 Dilated cardiomyopathy: Secondary | ICD-10-CM | POA: Diagnosis not present

## 2018-09-15 DIAGNOSIS — I509 Heart failure, unspecified: Secondary | ICD-10-CM | POA: Diagnosis not present

## 2018-09-15 DIAGNOSIS — N400 Enlarged prostate without lower urinary tract symptoms: Secondary | ICD-10-CM | POA: Diagnosis not present

## 2018-09-15 DIAGNOSIS — K219 Gastro-esophageal reflux disease without esophagitis: Secondary | ICD-10-CM | POA: Diagnosis not present

## 2018-09-18 DIAGNOSIS — N400 Enlarged prostate without lower urinary tract symptoms: Secondary | ICD-10-CM | POA: Diagnosis not present

## 2018-09-18 DIAGNOSIS — I420 Dilated cardiomyopathy: Secondary | ICD-10-CM | POA: Diagnosis not present

## 2018-09-18 DIAGNOSIS — I714 Abdominal aortic aneurysm, without rupture: Secondary | ICD-10-CM | POA: Diagnosis not present

## 2018-09-18 DIAGNOSIS — I499 Cardiac arrhythmia, unspecified: Secondary | ICD-10-CM | POA: Diagnosis not present

## 2018-09-18 DIAGNOSIS — K219 Gastro-esophageal reflux disease without esophagitis: Secondary | ICD-10-CM | POA: Diagnosis not present

## 2018-09-18 DIAGNOSIS — I509 Heart failure, unspecified: Secondary | ICD-10-CM | POA: Diagnosis not present

## 2018-09-24 DIAGNOSIS — I714 Abdominal aortic aneurysm, without rupture: Secondary | ICD-10-CM | POA: Diagnosis not present

## 2018-09-24 DIAGNOSIS — I499 Cardiac arrhythmia, unspecified: Secondary | ICD-10-CM | POA: Diagnosis not present

## 2018-09-24 DIAGNOSIS — N400 Enlarged prostate without lower urinary tract symptoms: Secondary | ICD-10-CM | POA: Diagnosis not present

## 2018-09-24 DIAGNOSIS — I420 Dilated cardiomyopathy: Secondary | ICD-10-CM | POA: Diagnosis not present

## 2018-09-24 DIAGNOSIS — I509 Heart failure, unspecified: Secondary | ICD-10-CM | POA: Diagnosis not present

## 2018-09-24 DIAGNOSIS — K219 Gastro-esophageal reflux disease without esophagitis: Secondary | ICD-10-CM | POA: Diagnosis not present

## 2018-09-28 DIAGNOSIS — R69 Illness, unspecified: Secondary | ICD-10-CM | POA: Diagnosis not present

## 2018-09-28 DIAGNOSIS — W19XXXA Unspecified fall, initial encounter: Secondary | ICD-10-CM | POA: Diagnosis not present

## 2018-09-28 DIAGNOSIS — R5381 Other malaise: Secondary | ICD-10-CM | POA: Diagnosis not present

## 2018-09-30 DIAGNOSIS — I509 Heart failure, unspecified: Secondary | ICD-10-CM | POA: Diagnosis not present

## 2018-09-30 DIAGNOSIS — I499 Cardiac arrhythmia, unspecified: Secondary | ICD-10-CM | POA: Diagnosis not present

## 2018-09-30 DIAGNOSIS — K219 Gastro-esophageal reflux disease without esophagitis: Secondary | ICD-10-CM | POA: Diagnosis not present

## 2018-09-30 DIAGNOSIS — N400 Enlarged prostate without lower urinary tract symptoms: Secondary | ICD-10-CM | POA: Diagnosis not present

## 2018-09-30 DIAGNOSIS — I714 Abdominal aortic aneurysm, without rupture: Secondary | ICD-10-CM | POA: Diagnosis not present

## 2018-09-30 DIAGNOSIS — I420 Dilated cardiomyopathy: Secondary | ICD-10-CM | POA: Diagnosis not present

## 2018-10-06 DIAGNOSIS — N400 Enlarged prostate without lower urinary tract symptoms: Secondary | ICD-10-CM | POA: Diagnosis not present

## 2018-10-06 DIAGNOSIS — I509 Heart failure, unspecified: Secondary | ICD-10-CM | POA: Diagnosis not present

## 2018-10-06 DIAGNOSIS — I499 Cardiac arrhythmia, unspecified: Secondary | ICD-10-CM | POA: Diagnosis not present

## 2018-10-06 DIAGNOSIS — I420 Dilated cardiomyopathy: Secondary | ICD-10-CM | POA: Diagnosis not present

## 2018-10-06 DIAGNOSIS — I714 Abdominal aortic aneurysm, without rupture: Secondary | ICD-10-CM | POA: Diagnosis not present

## 2018-10-06 DIAGNOSIS — K219 Gastro-esophageal reflux disease without esophagitis: Secondary | ICD-10-CM | POA: Diagnosis not present

## 2018-10-12 DIAGNOSIS — R0602 Shortness of breath: Secondary | ICD-10-CM | POA: Diagnosis not present

## 2018-10-12 DIAGNOSIS — I509 Heart failure, unspecified: Secondary | ICD-10-CM | POA: Diagnosis not present

## 2018-10-12 DIAGNOSIS — N400 Enlarged prostate without lower urinary tract symptoms: Secondary | ICD-10-CM | POA: Diagnosis not present

## 2018-10-12 DIAGNOSIS — M19011 Primary osteoarthritis, right shoulder: Secondary | ICD-10-CM | POA: Diagnosis not present

## 2018-10-12 DIAGNOSIS — I499 Cardiac arrhythmia, unspecified: Secondary | ICD-10-CM | POA: Diagnosis not present

## 2018-10-12 DIAGNOSIS — R531 Weakness: Secondary | ICD-10-CM | POA: Diagnosis not present

## 2018-10-12 DIAGNOSIS — J449 Chronic obstructive pulmonary disease, unspecified: Secondary | ICD-10-CM | POA: Diagnosis not present

## 2018-10-12 DIAGNOSIS — I420 Dilated cardiomyopathy: Secondary | ICD-10-CM | POA: Diagnosis not present

## 2018-10-12 DIAGNOSIS — K219 Gastro-esophageal reflux disease without esophagitis: Secondary | ICD-10-CM | POA: Diagnosis not present

## 2018-10-12 DIAGNOSIS — I714 Abdominal aortic aneurysm, without rupture: Secondary | ICD-10-CM | POA: Diagnosis not present

## 2018-10-15 DIAGNOSIS — I499 Cardiac arrhythmia, unspecified: Secondary | ICD-10-CM | POA: Diagnosis not present

## 2018-10-15 DIAGNOSIS — N400 Enlarged prostate without lower urinary tract symptoms: Secondary | ICD-10-CM | POA: Diagnosis not present

## 2018-10-15 DIAGNOSIS — I420 Dilated cardiomyopathy: Secondary | ICD-10-CM | POA: Diagnosis not present

## 2018-10-15 DIAGNOSIS — I714 Abdominal aortic aneurysm, without rupture: Secondary | ICD-10-CM | POA: Diagnosis not present

## 2018-10-15 DIAGNOSIS — I509 Heart failure, unspecified: Secondary | ICD-10-CM | POA: Diagnosis not present

## 2018-10-15 DIAGNOSIS — K219 Gastro-esophageal reflux disease without esophagitis: Secondary | ICD-10-CM | POA: Diagnosis not present

## 2018-10-20 DIAGNOSIS — N400 Enlarged prostate without lower urinary tract symptoms: Secondary | ICD-10-CM | POA: Diagnosis not present

## 2018-10-20 DIAGNOSIS — I714 Abdominal aortic aneurysm, without rupture: Secondary | ICD-10-CM | POA: Diagnosis not present

## 2018-10-20 DIAGNOSIS — I499 Cardiac arrhythmia, unspecified: Secondary | ICD-10-CM | POA: Diagnosis not present

## 2018-10-20 DIAGNOSIS — K219 Gastro-esophageal reflux disease without esophagitis: Secondary | ICD-10-CM | POA: Diagnosis not present

## 2018-10-20 DIAGNOSIS — I509 Heart failure, unspecified: Secondary | ICD-10-CM | POA: Diagnosis not present

## 2018-10-20 DIAGNOSIS — I420 Dilated cardiomyopathy: Secondary | ICD-10-CM | POA: Diagnosis not present

## 2018-10-28 DIAGNOSIS — I420 Dilated cardiomyopathy: Secondary | ICD-10-CM | POA: Diagnosis not present

## 2018-10-28 DIAGNOSIS — I714 Abdominal aortic aneurysm, without rupture: Secondary | ICD-10-CM | POA: Diagnosis not present

## 2018-10-28 DIAGNOSIS — N400 Enlarged prostate without lower urinary tract symptoms: Secondary | ICD-10-CM | POA: Diagnosis not present

## 2018-10-28 DIAGNOSIS — I509 Heart failure, unspecified: Secondary | ICD-10-CM | POA: Diagnosis not present

## 2018-10-28 DIAGNOSIS — K219 Gastro-esophageal reflux disease without esophagitis: Secondary | ICD-10-CM | POA: Diagnosis not present

## 2018-10-28 DIAGNOSIS — I499 Cardiac arrhythmia, unspecified: Secondary | ICD-10-CM | POA: Diagnosis not present

## 2018-10-30 ENCOUNTER — Ambulatory Visit (INDEPENDENT_AMBULATORY_CARE_PROVIDER_SITE_OTHER): Admitting: *Deleted

## 2018-10-30 DIAGNOSIS — I509 Heart failure, unspecified: Secondary | ICD-10-CM | POA: Diagnosis not present

## 2018-10-30 DIAGNOSIS — I441 Atrioventricular block, second degree: Secondary | ICD-10-CM

## 2018-10-30 DIAGNOSIS — N400 Enlarged prostate without lower urinary tract symptoms: Secondary | ICD-10-CM | POA: Diagnosis not present

## 2018-10-30 DIAGNOSIS — I714 Abdominal aortic aneurysm, without rupture: Secondary | ICD-10-CM | POA: Diagnosis not present

## 2018-10-30 DIAGNOSIS — K219 Gastro-esophageal reflux disease without esophagitis: Secondary | ICD-10-CM | POA: Diagnosis not present

## 2018-10-30 DIAGNOSIS — I499 Cardiac arrhythmia, unspecified: Secondary | ICD-10-CM | POA: Diagnosis not present

## 2018-10-30 DIAGNOSIS — I420 Dilated cardiomyopathy: Secondary | ICD-10-CM | POA: Diagnosis not present

## 2018-10-30 DIAGNOSIS — R001 Bradycardia, unspecified: Secondary | ICD-10-CM

## 2018-10-30 LAB — CUP PACEART REMOTE DEVICE CHECK
Battery Remaining Longevity: 96 mo
Battery Remaining Percentage: 95.5 %
Battery Voltage: 2.98 V
Brady Statistic AP VP Percent: 1 %
Brady Statistic AP VS Percent: 1 %
Brady Statistic AS VP Percent: 99 %
Brady Statistic AS VS Percent: 1 %
Brady Statistic RA Percent Paced: 1 %
Brady Statistic RV Percent Paced: 99 %
Date Time Interrogation Session: 20200619060014
Implantable Lead Implant Date: 20170522
Implantable Lead Implant Date: 20170522
Implantable Lead Location: 753859
Implantable Lead Location: 753860
Implantable Pulse Generator Implant Date: 20170522
Lead Channel Impedance Value: 350 Ohm
Lead Channel Impedance Value: 390 Ohm
Lead Channel Pacing Threshold Amplitude: 0.75 V
Lead Channel Pacing Threshold Amplitude: 0.75 V
Lead Channel Pacing Threshold Pulse Width: 0.5 ms
Lead Channel Pacing Threshold Pulse Width: 0.5 ms
Lead Channel Sensing Intrinsic Amplitude: 0.6 mV
Lead Channel Sensing Intrinsic Amplitude: 9.6 mV
Lead Channel Setting Pacing Amplitude: 2 V
Lead Channel Setting Pacing Amplitude: 2.5 V
Lead Channel Setting Pacing Pulse Width: 0.5 ms
Lead Channel Setting Sensing Sensitivity: 2 mV
Pulse Gen Model: 2272
Pulse Gen Serial Number: 7899864

## 2018-11-04 DIAGNOSIS — I420 Dilated cardiomyopathy: Secondary | ICD-10-CM | POA: Diagnosis not present

## 2018-11-04 DIAGNOSIS — I499 Cardiac arrhythmia, unspecified: Secondary | ICD-10-CM | POA: Diagnosis not present

## 2018-11-04 DIAGNOSIS — K219 Gastro-esophageal reflux disease without esophagitis: Secondary | ICD-10-CM | POA: Diagnosis not present

## 2018-11-04 DIAGNOSIS — I714 Abdominal aortic aneurysm, without rupture: Secondary | ICD-10-CM | POA: Diagnosis not present

## 2018-11-04 DIAGNOSIS — I509 Heart failure, unspecified: Secondary | ICD-10-CM | POA: Diagnosis not present

## 2018-11-04 DIAGNOSIS — N400 Enlarged prostate without lower urinary tract symptoms: Secondary | ICD-10-CM | POA: Diagnosis not present

## 2018-11-04 NOTE — Progress Notes (Signed)
Remote pacemaker transmission.   

## 2018-11-11 DIAGNOSIS — N400 Enlarged prostate without lower urinary tract symptoms: Secondary | ICD-10-CM | POA: Diagnosis not present

## 2018-11-11 DIAGNOSIS — J449 Chronic obstructive pulmonary disease, unspecified: Secondary | ICD-10-CM | POA: Diagnosis not present

## 2018-11-11 DIAGNOSIS — R531 Weakness: Secondary | ICD-10-CM | POA: Diagnosis not present

## 2018-11-11 DIAGNOSIS — I499 Cardiac arrhythmia, unspecified: Secondary | ICD-10-CM | POA: Diagnosis not present

## 2018-11-11 DIAGNOSIS — I509 Heart failure, unspecified: Secondary | ICD-10-CM | POA: Diagnosis not present

## 2018-11-11 DIAGNOSIS — I714 Abdominal aortic aneurysm, without rupture: Secondary | ICD-10-CM | POA: Diagnosis not present

## 2018-11-11 DIAGNOSIS — I420 Dilated cardiomyopathy: Secondary | ICD-10-CM | POA: Diagnosis not present

## 2018-11-11 DIAGNOSIS — K219 Gastro-esophageal reflux disease without esophagitis: Secondary | ICD-10-CM | POA: Diagnosis not present

## 2018-11-11 DIAGNOSIS — R0602 Shortness of breath: Secondary | ICD-10-CM | POA: Diagnosis not present

## 2018-11-11 DIAGNOSIS — M19011 Primary osteoarthritis, right shoulder: Secondary | ICD-10-CM | POA: Diagnosis not present

## 2018-11-16 ENCOUNTER — Ambulatory Visit: Admitting: Cardiology

## 2018-11-16 DIAGNOSIS — I499 Cardiac arrhythmia, unspecified: Secondary | ICD-10-CM | POA: Diagnosis not present

## 2018-11-16 DIAGNOSIS — I509 Heart failure, unspecified: Secondary | ICD-10-CM | POA: Diagnosis not present

## 2018-11-16 DIAGNOSIS — I420 Dilated cardiomyopathy: Secondary | ICD-10-CM | POA: Diagnosis not present

## 2018-11-16 DIAGNOSIS — I714 Abdominal aortic aneurysm, without rupture: Secondary | ICD-10-CM | POA: Diagnosis not present

## 2018-11-16 DIAGNOSIS — K219 Gastro-esophageal reflux disease without esophagitis: Secondary | ICD-10-CM | POA: Diagnosis not present

## 2018-11-16 DIAGNOSIS — N400 Enlarged prostate without lower urinary tract symptoms: Secondary | ICD-10-CM | POA: Diagnosis not present

## 2018-11-16 NOTE — Progress Notes (Deleted)
Cardiology Office Note  Date: 11/16/2018   ID: Shawn Frye, DOB [birth year], MRN [id number]  PCP:  [name], MD  Cardiologist:  Rozann Lesches, MD Electrophysiologist:  None   No chief complaint on file.   History of Present Illness: Shawn Frye is a 86 y.o. male last seen by Ms. Strader PA-C in February.  He is followed by Dr. [name] with a St. Jude pacemaker in place.  Most recent interrogation in June showed normal device function.  Past Medical History:  Diagnosis Date  . AAA (abdominal aortic aneurysm) ([place])   . Arthritis   . Chronic combined systolic (congestive) and diastolic (congestive) heart failure ([place])    a. 10/2017: echo showing a reduced EF of 25%, diffuse HK with akinesis of the apical myocardium, Grade 2 DD, mild AI, mild to moderate MR, and mild to moderate TR  . GERD (gastroesophageal reflux disease)   . Heart block AV second degree    St. Jude pacemaker May 2017 - Dr. [name]  . Infection of bladder   . Joint pain   . Symptomatic bradycardia     Past Surgical History:  Procedure Laterality Date  . BACK SURGERY    . BIOPSY  01/28/2018   Procedure: BIOPSY;  Surgeon: Rogene Houston, MD;  [facility];  Service: Endoscopy;;  pre-pyloric  . CATARACT EXTRACTION W/ INTRAOCULAR LENS  IMPLANT, BILATERAL Bilateral   . EP IMPLANTABLE DEVICE N/A 10/02/2015   Procedure: Pacemaker Implant;  Surgeon: Evans Lance, MD;  Location: [facility];  Service: Cardiovascular;  Laterality: N/A;  . ESOPHAGOGASTRODUODENOSCOPY N/A 01/28/2018   Procedure: ESOPHAGOGASTRODUODENOSCOPY (EGD);  Surgeon: Rogene Houston, MD;  [facility];  Service: Endoscopy;  Laterality: N/A;  . EXCISIONAL HEMORRHOIDECTOMY    . HEMORROIDECTOMY    . INGUINAL HERNIA REPAIR Bilateral 1991  . INSERT / REPLACE / REMOVE PACEMAKER    . JOINT REPLACEMENT    . [place] SURGERY  2005  . TOTAL KNEE ARTHROPLASTY Bilateral 1998 -  2003  . TOTAL SHOULDER REPLACEMENT  Left 1999   Partial  . TYMPANOSTOMY TUBE PLACEMENT Right     Current Outpatient Medications  Medication Sig Dispense Refill  . ALPRAZolam (XANAX) 0.25 MG tablet Take 0.125-0.25 mg by mouth daily as needed for anxiety.    . feeding supplement, ENSURE ENLIVE, (ENSURE ENLIVE) LIQD Take 237 mLs by mouth 2 (two) times daily between meals.    Marland Kitchen loratadine (CLARITIN) 10 MG tablet Take 10 mg by mouth daily.    . pantoprazole (PROTONIX) 40 MG tablet Take 1 tablet (40 mg total) by mouth daily. 30 tablet 1  . potassium chloride SA (K-DUR,KLOR-CON) 20 MEQ tablet Take 3 tablets (60 mEq total) by mouth daily. 90 tablet 6  . torsemide (DEMADEX) 20 MG tablet Take 40 mg in the morning and Take 20 mg in the evening. 90 tablet 11   No current facility-administered medications for this visit.    Allergies:  Percocet [oxycodone-acetaminophen], Noroxin [norfloxacin], and Celebrex [celecoxib]   Social History: The patient  reports that he quit smoking about 68 years ago. His smoking use included cigarettes. He has a 15.00 pack-year smoking history. He has never used smokeless tobacco. He reports that he does not drink alcohol or use drugs.   Family History: The patient's family history includes Cystic fibrosis in his paternal aunt; Heart disease in his father; Pneumonia in his mother.   ROS:  Please see the history  of present illness. Otherwise, complete review of systems is positive for {NONE DEFAULTED:18576::"none"}.  All other systems are reviewed and negative.   Physical Exam: VS:  There were no vitals taken for this visit., BMI There is no height or weight on file to calculate BMI.  Wt Readings from Last 3 Encounters:  07/09/18 152 lb 12.8 oz (69.3 kg)  06/18/18 148 lb (67.1 kg)  06/02/18 144 lb 9.6 oz (65.6 kg)    General: Patient appears comfortable at rest. HEENT: Conjunctiva and lids normal, oropharynx clear with moist mucosa. Neck: Supple, no elevated JVP or carotid bruits, no thyromegaly.  Lungs: Clear to auscultation, nonlabored breathing at rest. Cardiac: Regular rate and rhythm, no S3 or significant systolic murmur, no pericardial rub. Abdomen: Soft, nontender, no hepatomegaly, bowel sounds present, no guarding or rebound. Extremities: No pitting edema, distal pulses 2+. Skin: Warm and dry. Musculoskeletal: No kyphosis. Neuropsychiatric: Alert and oriented x3, affect grossly appropriate.  ECG:  An ECG dated 06/02/2018 was personally reviewed today and demonstrated:  Probable paced ventricular rhythm.  Recent Labwork: 01/26/2018: Magnesium 2.0 06/02/2018: ALT 12; AST 22; B Natriuretic Peptide 1,695.0; Hemoglobin 9.9; Platelets 163 07/09/2018: BUN 30; Creatinine, Ser 1.38; Potassium 3.3; Sodium 134   Other Studies Reviewed Today:  Echocardiogram 12/14/2017: Study Conclusions  - Left ventricle: The cavity size was mildly dilated. Wall   thickness was normal. Systolic function was severely reduced. The   estimated ejection fraction was 20%. Diffuse hypokinesis.   Features are consistent with a pseudonormal left ventricular   filling pattern, with concomitant abnormal relaxation and   increased filling pressure (grade 2 diastolic dysfunction).   Doppler parameters are consistent with high ventricular filling   pressure. - Regional wall motion abnormality: Akinesis of the apical   myocardium. - Ventricular septum: Septal motion showed abnormal function and   dyssynergy. - Aortic valve: Moderately calcified annulus. Trileaflet. There was   mild regurgitation. - Aorta: Mild aortic root enlargement. - Mitral valve: Calcified annulus. There was moderate   regurgitation. - Left atrium: The atrium was severely dilated. - Right ventricle: Pacer wire or catheter noted in right ventricle.   Systolic function was mildly to moderately reduced. - Right atrium: The atrium was moderately dilated. - Tricuspid valve: There was moderate-severe regurgitation. - Pulmonic valve: There  was mild regurgitation. - Pulmonary arteries: PA peak pressure: 56 mm Hg (S). - Inferior vena cava: The vessel was dilated. The respirophasic   diameter changes were blunted (< 50%), consistent with elevated   central venous pressure.  Assessment and Plan:   Medication Adjustments/Labs and Tests Ordered: Current medicines are reviewed at length with the patient today.  Concerns regarding medicines are outlined above.   Tests Ordered: No orders of the defined types were placed in this encounter.   Medication Changes: No orders of the defined types were placed in this encounter.   Disposition:  Follow up {follow up:15908}  Signed, Satira Sark, MD, [facility] 11/16/2018 9:54 AM     [facility]. [address] [place], [place] [address] Phone: [phone]; Fax: [phone]

## 2018-11-17 ENCOUNTER — Encounter: Payer: Self-pay | Admitting: Cardiology

## 2018-11-23 DIAGNOSIS — N400 Enlarged prostate without lower urinary tract symptoms: Secondary | ICD-10-CM | POA: Diagnosis not present

## 2018-11-23 DIAGNOSIS — I499 Cardiac arrhythmia, unspecified: Secondary | ICD-10-CM | POA: Diagnosis not present

## 2018-11-23 DIAGNOSIS — I714 Abdominal aortic aneurysm, without rupture: Secondary | ICD-10-CM | POA: Diagnosis not present

## 2018-11-23 DIAGNOSIS — K219 Gastro-esophageal reflux disease without esophagitis: Secondary | ICD-10-CM | POA: Diagnosis not present

## 2018-11-23 DIAGNOSIS — I420 Dilated cardiomyopathy: Secondary | ICD-10-CM | POA: Diagnosis not present

## 2018-11-23 DIAGNOSIS — I509 Heart failure, unspecified: Secondary | ICD-10-CM | POA: Diagnosis not present

## 2018-11-25 DIAGNOSIS — K219 Gastro-esophageal reflux disease without esophagitis: Secondary | ICD-10-CM | POA: Diagnosis not present

## 2018-11-25 DIAGNOSIS — N400 Enlarged prostate without lower urinary tract symptoms: Secondary | ICD-10-CM | POA: Diagnosis not present

## 2018-11-25 DIAGNOSIS — I714 Abdominal aortic aneurysm, without rupture: Secondary | ICD-10-CM | POA: Diagnosis not present

## 2018-11-25 DIAGNOSIS — I509 Heart failure, unspecified: Secondary | ICD-10-CM | POA: Diagnosis not present

## 2018-11-25 DIAGNOSIS — I499 Cardiac arrhythmia, unspecified: Secondary | ICD-10-CM | POA: Diagnosis not present

## 2018-11-25 DIAGNOSIS — I420 Dilated cardiomyopathy: Secondary | ICD-10-CM | POA: Diagnosis not present

## 2018-11-26 ENCOUNTER — Other Ambulatory Visit

## 2018-11-26 ENCOUNTER — Ambulatory Visit (INDEPENDENT_AMBULATORY_CARE_PROVIDER_SITE_OTHER): Payer: Medicare Other | Admitting: Otolaryngology

## 2018-11-26 ENCOUNTER — Other Ambulatory Visit: Payer: Self-pay

## 2018-11-26 DIAGNOSIS — H6123 Impacted cerumen, bilateral: Secondary | ICD-10-CM | POA: Diagnosis not present

## 2018-11-26 DIAGNOSIS — R6889 Other general symptoms and signs: Secondary | ICD-10-CM | POA: Diagnosis not present

## 2018-11-26 DIAGNOSIS — Z20822 Contact with and (suspected) exposure to covid-19: Secondary | ICD-10-CM

## 2018-11-30 LAB — NOVEL CORONAVIRUS, NAA: SARS-CoV-2, NAA: NOT DETECTED

## 2018-12-01 DIAGNOSIS — I499 Cardiac arrhythmia, unspecified: Secondary | ICD-10-CM | POA: Diagnosis not present

## 2018-12-01 DIAGNOSIS — I420 Dilated cardiomyopathy: Secondary | ICD-10-CM | POA: Diagnosis not present

## 2018-12-01 DIAGNOSIS — I509 Heart failure, unspecified: Secondary | ICD-10-CM | POA: Diagnosis not present

## 2018-12-01 DIAGNOSIS — K219 Gastro-esophageal reflux disease without esophagitis: Secondary | ICD-10-CM | POA: Diagnosis not present

## 2018-12-01 DIAGNOSIS — I714 Abdominal aortic aneurysm, without rupture: Secondary | ICD-10-CM | POA: Diagnosis not present

## 2018-12-01 DIAGNOSIS — N400 Enlarged prostate without lower urinary tract symptoms: Secondary | ICD-10-CM | POA: Diagnosis not present

## 2018-12-08 ENCOUNTER — Other Ambulatory Visit (HOSPITAL_COMMUNITY)
Admission: RE | Admit: 2018-12-08 | Discharge: 2018-12-08 | Disposition: A | Discharge status: Home / Self Care | Payer: Medicare Other | Source: Ambulatory Visit | Attending: Urology | Admitting: Urology

## 2018-12-08 ENCOUNTER — Ambulatory Visit (INDEPENDENT_AMBULATORY_CARE_PROVIDER_SITE_OTHER): Payer: Medicare Other | Admitting: Urology

## 2018-12-08 DIAGNOSIS — N3000 Acute cystitis without hematuria: Secondary | ICD-10-CM | POA: Diagnosis not present

## 2018-12-08 DIAGNOSIS — R3914 Feeling of incomplete bladder emptying: Secondary | ICD-10-CM | POA: Diagnosis not present

## 2018-12-08 DIAGNOSIS — R3915 Urgency of urination: Secondary | ICD-10-CM | POA: Diagnosis not present

## 2018-12-09 ENCOUNTER — Telehealth: Payer: Self-pay | Admitting: Internal Medicine

## 2018-12-09 DIAGNOSIS — K219 Gastro-esophageal reflux disease without esophagitis: Secondary | ICD-10-CM | POA: Diagnosis not present

## 2018-12-09 DIAGNOSIS — I509 Heart failure, unspecified: Secondary | ICD-10-CM | POA: Diagnosis not present

## 2018-12-09 DIAGNOSIS — I714 Abdominal aortic aneurysm, without rupture: Secondary | ICD-10-CM | POA: Diagnosis not present

## 2018-12-09 DIAGNOSIS — N400 Enlarged prostate without lower urinary tract symptoms: Secondary | ICD-10-CM | POA: Diagnosis not present

## 2018-12-09 DIAGNOSIS — I499 Cardiac arrhythmia, unspecified: Secondary | ICD-10-CM | POA: Diagnosis not present

## 2018-12-09 DIAGNOSIS — I420 Dilated cardiomyopathy: Secondary | ICD-10-CM | POA: Diagnosis not present

## 2018-12-09 LAB — URINE CULTURE: Culture: <10000 — AB

## 2018-12-09 NOTE — Telephone Encounter (Signed)
Informed pt of negative covid result   °

## 2018-12-10 DIAGNOSIS — I509 Heart failure, unspecified: Secondary | ICD-10-CM | POA: Diagnosis not present

## 2018-12-10 DIAGNOSIS — K219 Gastro-esophageal reflux disease without esophagitis: Secondary | ICD-10-CM | POA: Diagnosis not present

## 2018-12-10 DIAGNOSIS — I420 Dilated cardiomyopathy: Secondary | ICD-10-CM | POA: Diagnosis not present

## 2018-12-10 DIAGNOSIS — N400 Enlarged prostate without lower urinary tract symptoms: Secondary | ICD-10-CM | POA: Diagnosis not present

## 2018-12-10 DIAGNOSIS — I499 Cardiac arrhythmia, unspecified: Secondary | ICD-10-CM | POA: Diagnosis not present

## 2018-12-10 DIAGNOSIS — I714 Abdominal aortic aneurysm, without rupture: Secondary | ICD-10-CM | POA: Diagnosis not present

## 2018-12-12 DIAGNOSIS — I420 Dilated cardiomyopathy: Secondary | ICD-10-CM | POA: Diagnosis not present

## 2018-12-12 DIAGNOSIS — I509 Heart failure, unspecified: Secondary | ICD-10-CM | POA: Diagnosis not present

## 2018-12-12 DIAGNOSIS — I714 Abdominal aortic aneurysm, without rupture: Secondary | ICD-10-CM | POA: Diagnosis not present

## 2018-12-12 DIAGNOSIS — K219 Gastro-esophageal reflux disease without esophagitis: Secondary | ICD-10-CM | POA: Diagnosis not present

## 2018-12-12 DIAGNOSIS — N400 Enlarged prostate without lower urinary tract symptoms: Secondary | ICD-10-CM | POA: Diagnosis not present

## 2018-12-12 DIAGNOSIS — M19011 Primary osteoarthritis, right shoulder: Secondary | ICD-10-CM | POA: Diagnosis not present

## 2018-12-12 DIAGNOSIS — R0602 Shortness of breath: Secondary | ICD-10-CM | POA: Diagnosis not present

## 2018-12-12 DIAGNOSIS — R531 Weakness: Secondary | ICD-10-CM | POA: Diagnosis not present

## 2018-12-12 DIAGNOSIS — I499 Cardiac arrhythmia, unspecified: Secondary | ICD-10-CM | POA: Diagnosis not present

## 2018-12-12 DIAGNOSIS — J449 Chronic obstructive pulmonary disease, unspecified: Secondary | ICD-10-CM | POA: Diagnosis not present

## 2018-12-14 DIAGNOSIS — I499 Cardiac arrhythmia, unspecified: Secondary | ICD-10-CM | POA: Diagnosis not present

## 2018-12-14 DIAGNOSIS — I420 Dilated cardiomyopathy: Secondary | ICD-10-CM | POA: Diagnosis not present

## 2018-12-14 DIAGNOSIS — I509 Heart failure, unspecified: Secondary | ICD-10-CM | POA: Diagnosis not present

## 2018-12-14 DIAGNOSIS — K219 Gastro-esophageal reflux disease without esophagitis: Secondary | ICD-10-CM | POA: Diagnosis not present

## 2018-12-14 DIAGNOSIS — N400 Enlarged prostate without lower urinary tract symptoms: Secondary | ICD-10-CM | POA: Diagnosis not present

## 2018-12-14 DIAGNOSIS — I714 Abdominal aortic aneurysm, without rupture: Secondary | ICD-10-CM | POA: Diagnosis not present

## 2018-12-22 DIAGNOSIS — I509 Heart failure, unspecified: Secondary | ICD-10-CM | POA: Diagnosis not present

## 2018-12-22 DIAGNOSIS — N400 Enlarged prostate without lower urinary tract symptoms: Secondary | ICD-10-CM | POA: Diagnosis not present

## 2018-12-22 DIAGNOSIS — I714 Abdominal aortic aneurysm, without rupture: Secondary | ICD-10-CM | POA: Diagnosis not present

## 2018-12-22 DIAGNOSIS — K219 Gastro-esophageal reflux disease without esophagitis: Secondary | ICD-10-CM | POA: Diagnosis not present

## 2018-12-22 DIAGNOSIS — I420 Dilated cardiomyopathy: Secondary | ICD-10-CM | POA: Diagnosis not present

## 2018-12-22 DIAGNOSIS — I499 Cardiac arrhythmia, unspecified: Secondary | ICD-10-CM | POA: Diagnosis not present

## 2018-12-23 DIAGNOSIS — K219 Gastro-esophageal reflux disease without esophagitis: Secondary | ICD-10-CM | POA: Diagnosis not present

## 2018-12-23 DIAGNOSIS — N400 Enlarged prostate without lower urinary tract symptoms: Secondary | ICD-10-CM | POA: Diagnosis not present

## 2018-12-23 DIAGNOSIS — I509 Heart failure, unspecified: Secondary | ICD-10-CM | POA: Diagnosis not present

## 2018-12-23 DIAGNOSIS — I420 Dilated cardiomyopathy: Secondary | ICD-10-CM | POA: Diagnosis not present

## 2018-12-23 DIAGNOSIS — I499 Cardiac arrhythmia, unspecified: Secondary | ICD-10-CM | POA: Diagnosis not present

## 2018-12-23 DIAGNOSIS — I714 Abdominal aortic aneurysm, without rupture: Secondary | ICD-10-CM | POA: Diagnosis not present

## 2018-12-28 DIAGNOSIS — K219 Gastro-esophageal reflux disease without esophagitis: Secondary | ICD-10-CM | POA: Diagnosis not present

## 2018-12-28 DIAGNOSIS — N400 Enlarged prostate without lower urinary tract symptoms: Secondary | ICD-10-CM | POA: Diagnosis not present

## 2018-12-28 DIAGNOSIS — I714 Abdominal aortic aneurysm, without rupture: Secondary | ICD-10-CM | POA: Diagnosis not present

## 2018-12-28 DIAGNOSIS — I499 Cardiac arrhythmia, unspecified: Secondary | ICD-10-CM | POA: Diagnosis not present

## 2018-12-28 DIAGNOSIS — I509 Heart failure, unspecified: Secondary | ICD-10-CM | POA: Diagnosis not present

## 2018-12-28 DIAGNOSIS — I420 Dilated cardiomyopathy: Secondary | ICD-10-CM | POA: Diagnosis not present

## 2018-12-30 ENCOUNTER — Ambulatory Visit (INDEPENDENT_AMBULATORY_CARE_PROVIDER_SITE_OTHER): Payer: Medicare Other | Admitting: Internal Medicine

## 2018-12-30 ENCOUNTER — Other Ambulatory Visit: Payer: Self-pay

## 2018-12-30 ENCOUNTER — Encounter: Payer: Self-pay | Admitting: Internal Medicine

## 2018-12-30 VITALS — BP 92/59 | HR 89 | Temp 97.7°F | Wt 138.0 lb

## 2018-12-30 DIAGNOSIS — I441 Atrioventricular block, second degree: Secondary | ICD-10-CM | POA: Diagnosis not present

## 2018-12-30 DIAGNOSIS — Z950 Presence of cardiac pacemaker: Secondary | ICD-10-CM | POA: Diagnosis not present

## 2018-12-30 MED ORDER — SPIRONOLACTONE 50 MG PO TABS: 25.0000 mg | ORAL_TABLET | Freq: Every day | ORAL | 3 refills | Status: DC

## 2018-12-30 NOTE — Patient Instructions (Signed)
Medication Instructions: DECREASE Aldactone to 25 mg daily  Labwork: None today  Procedures/Testing: None today  Follow-Up: 1 year with Dr.Taylor  Any Additional Special Instructions Will Be Listed Below (If Applicable).     If you need a refill on your cardiac medications before your next appointment, please call your pharmacy.       Thank you for choosing [facility] !

## 2018-12-30 NOTE — Progress Notes (Signed)
HPI Mr. Shawn Frye reurns today for PPM followup. He is a pleasant elderly man with advanced age, CHB, who has been found to have severe LV dysfunction by echo. His advanced age makes him not a candidate for additional treatment. No Biv upgrade. He was placed on lasix and switched to torsemide. He suffers from low blood pressure and has some dizziness. No syncope. He had a UTI a few weeks ago. Allergies  Allergen Reactions  . Percocet [Oxycodone-Acetaminophen] Hives  . Noroxin [Norfloxacin] Nausea And Vomiting  . Celebrex [Celecoxib] Rash     Current Outpatient Medications  Medication Sig Dispense Refill  . ALPRAZolam (XANAX) 0.25 MG tablet Take 0.125-0.25 mg by mouth daily as needed for anxiety.    . feeding supplement, ENSURE ENLIVE, (ENSURE ENLIVE) LIQD Take 237 mLs by mouth 2 (two) times daily between meals.    Marland Kitchen loratadine (CLARITIN) 10 MG tablet Take 10 mg by mouth daily.    . pantoprazole (PROTONIX) 40 MG tablet Take 1 tablet (40 mg total) by mouth daily. 30 tablet 1  . potassium chloride SA (K-DUR,KLOR-CON) 20 MEQ tablet Take 3 tablets (60 mEq total) by mouth daily. 90 tablet 6  . spironolactone (ALDACTONE) 50 MG tablet Take 50 mg by mouth daily.    Marland Kitchen torsemide (DEMADEX) 20 MG tablet Take 40 mg in the morning and Take 20 mg in the evening. (Patient taking differently: Take 20 mg by mouth as needed. Take 40 mg in the morning and Take 20 mg in the evening.) 90 tablet 11   No current facility-administered medications for this visit.      Past Medical History:  Diagnosis Date  . AAA (abdominal aortic aneurysm) ([place])   . Arthritis   . Chronic combined systolic (congestive) and diastolic (congestive) heart failure ([place])    a. 10/2017: echo showing a reduced EF of 25%, diffuse HK with akinesis of the apical myocardium, Grade 2 DD, mild AI, mild to moderate MR, and mild to moderate TR  . GERD (gastroesophageal reflux disease)   . Heart block AV second degree    St. Jude pacemaker  May 2017 - Dr. [name]  . Infection of bladder   . Joint pain   . Symptomatic bradycardia     ROS:   All systems reviewed and negative except as noted in the HPI.   Past Surgical History:  Procedure Laterality Date  . BACK SURGERY    . BIOPSY  01/28/2018   Procedure: BIOPSY;  Surgeon: Rogene Houston, MD;  [facility];  Service: Endoscopy;;  pre-pyloric  . CATARACT EXTRACTION W/ INTRAOCULAR LENS  IMPLANT, BILATERAL Bilateral   . EP IMPLANTABLE DEVICE N/A 10/02/2015   Procedure: Pacemaker Implant;  Surgeon: Evans Lance, MD;  Location: [facility];  Service: Cardiovascular;  Laterality: N/A;  . ESOPHAGOGASTRODUODENOSCOPY N/A 01/28/2018   Procedure: ESOPHAGOGASTRODUODENOSCOPY (EGD);  Surgeon: Rogene Houston, MD;  [facility];  Service: Endoscopy;  Laterality: N/A;  . EXCISIONAL HEMORRHOIDECTOMY    . HEMORROIDECTOMY    . INGUINAL HERNIA REPAIR Bilateral 1991  . INSERT / REPLACE / REMOVE PACEMAKER    . JOINT REPLACEMENT    . [place] SURGERY  2005  . TOTAL KNEE ARTHROPLASTY Bilateral 1998 -  2003  . TOTAL SHOULDER REPLACEMENT Left 1999   Partial  . TYMPANOSTOMY TUBE PLACEMENT Right      Family History  Problem Relation Age of Onset  . Heart disease Father  NOT  before age 69  . Pneumonia Mother   . Cystic fibrosis Paternal Aunt   . Kidney disease Neg Hx      Social History   Socioeconomic History  . Marital status: Married    Spouse name: Not on file  . Number of children: Not on file  . Years of education: Not on file  . Highest education level: Not on file  Occupational History  . Not on file  Social Needs  . Financial resource strain: Not on file  . Food insecurity    Worry: Not on file    Inability: Not on file  . Transportation needs    Medical: Not on file    Non-medical: Not on file  Tobacco Use  . Smoking status: Former Smoker    Packs/day: 1.00    Years: 15.00    Pack years: 15.00    Types: Cigarettes     Quit date: 05/13/1950    Years since quitting: 68.6  . Smokeless tobacco: Never Used  Substance and Sexual Activity  . Alcohol use: No    Alcohol/week: 0.0 standard drinks  . Drug use: No  . Sexual activity: Never  Lifestyle  . Physical activity    Days per week: Not on file    Minutes per session: Not on file  . Stress: Not on file  Relationships  . Social Herbalist on phone: Not on file    Gets together: Not on file    Attends religious service: Not on file    Active member of club or organization: Not on file    Attends meetings of clubs or organizations: Not on file    Relationship status: Not on file  . Intimate partner violence    Fear of current or ex partner: Not on file    Emotionally abused: Not on file    Physically abused: Not on file    Forced sexual activity: Not on file  Other Topics Concern  . Not on file  Social History Narrative  . Not on file     BP (!) 92/59   Pulse 89   Temp 97.7 F (36.5 C)   Wt 138 lb (62.6 kg)   SpO2 96%   BMI 21.61 kg/m   Physical Exam:  Well appearing NAD HEENT: Unremarkable Neck:  No JVD, no thyromegally Lymphatics:  No adenopathy Back:  No CVA tenderness Lungs:  Clear with no wheezes HEART:  Regular rate rhythm, no murmurs, no rubs, no clicks Abd:  soft, positive bowel sounds, no organomegally, no rebound, no guarding Ext:  2 plus pulses, no edema, no cyanosis, no clubbing Skin:  No rashes no nodules Neuro:  CN II through XII intact, motor grossly intact   DEVICE  Normal device function.  See PaceArt for details.   Assess/Plan: 1. CHB - he is asymptomatic s/p PPM. He has an escape today at 40/min. 2. PPM - His st. Jude DDD PM is working normally. We will recheck in several months. 3. SVT - he has had some atrial tachy but no evidence of atrial fib.  4. Chronic systolic heart failure - he has been limited by hypotension. I asked him to reduce his dose of aldactone from 50 to 25 mg daily.  Mikle Bosworth.D.

## 2019-01-01 LAB — CUP PACEART INCLINIC DEVICE CHECK
Battery Remaining Longevity: 93 mo
Battery Voltage: 2.98 V
Brady Statistic RA Percent Paced: 1.6 %
Brady Statistic RV Percent Paced: 99.32 %
Date Time Interrogation Session: 20200819145916
Implantable Lead Implant Date: 20170522
Implantable Lead Implant Date: 20170522
Implantable Lead Location: 753859
Implantable Lead Location: 753860
Implantable Pulse Generator Implant Date: 20170522
Lead Channel Impedance Value: 350 Ohm
Lead Channel Impedance Value: 387.5 Ohm
Lead Channel Pacing Threshold Amplitude: 0.75 V
Lead Channel Pacing Threshold Amplitude: 0.75 V
Lead Channel Pacing Threshold Pulse Width: 0.5 ms
Lead Channel Pacing Threshold Pulse Width: 0.5 ms
Lead Channel Sensing Intrinsic Amplitude: 0.5 mV
Lead Channel Sensing Intrinsic Amplitude: 4.9 mV
Lead Channel Setting Pacing Amplitude: 2 V
Lead Channel Setting Pacing Amplitude: 2.5 V
Lead Channel Setting Pacing Pulse Width: 0.5 ms
Lead Channel Setting Sensing Sensitivity: 2 mV
Pulse Gen Model: 2272
Pulse Gen Serial Number: 7899864

## 2019-01-06 ENCOUNTER — Encounter: Payer: Self-pay | Admitting: Orthopaedic Surgery

## 2019-01-06 ENCOUNTER — Ambulatory Visit (INDEPENDENT_AMBULATORY_CARE_PROVIDER_SITE_OTHER): Payer: Medicare Other | Admitting: Orthopaedic Surgery

## 2019-01-06 ENCOUNTER — Other Ambulatory Visit: Payer: Self-pay

## 2019-01-06 VITALS — BP 110/71 | HR 96 | Ht 67.0 in | Wt 140.0 lb

## 2019-01-06 DIAGNOSIS — N400 Enlarged prostate without lower urinary tract symptoms: Secondary | ICD-10-CM | POA: Diagnosis not present

## 2019-01-06 DIAGNOSIS — I714 Abdominal aortic aneurysm, without rupture: Secondary | ICD-10-CM | POA: Diagnosis not present

## 2019-01-06 DIAGNOSIS — M19011 Primary osteoarthritis, right shoulder: Secondary | ICD-10-CM

## 2019-01-06 DIAGNOSIS — M7061 Trochanteric bursitis, right hip: Secondary | ICD-10-CM | POA: Diagnosis not present

## 2019-01-06 DIAGNOSIS — K219 Gastro-esophageal reflux disease without esophagitis: Secondary | ICD-10-CM | POA: Diagnosis not present

## 2019-01-06 DIAGNOSIS — I509 Heart failure, unspecified: Secondary | ICD-10-CM | POA: Diagnosis not present

## 2019-01-06 DIAGNOSIS — I420 Dilated cardiomyopathy: Secondary | ICD-10-CM | POA: Diagnosis not present

## 2019-01-06 DIAGNOSIS — I499 Cardiac arrhythmia, unspecified: Secondary | ICD-10-CM | POA: Diagnosis not present

## 2019-01-06 MED ORDER — LIDOCAINE HCL 1 % IJ SOLN
2.0000 mL | INTRAMUSCULAR | Status: AC | PRN
  Administered 2019-01-06: 10:00:00 2 mL

## 2019-01-06 MED ORDER — LIDOCAINE HCL 1 % IJ SOLN
2.0000 mL | INTRAMUSCULAR | Status: AC | PRN
  Administered 2019-01-06: 2 mL

## 2019-01-06 MED ORDER — BUPIVACAINE HCL 0.5 % IJ SOLN
2.0000 mL | INTRAMUSCULAR | Status: AC | PRN
  Administered 2019-01-06: 2 mL via INTRA_ARTICULAR

## 2019-01-06 MED ORDER — METHYLPREDNISOLONE ACETATE 40 MG/ML IJ SUSP
80.0000 mg | INTRAMUSCULAR | Status: AC | PRN
  Administered 2019-01-06: 80 mg via INTRA_ARTICULAR

## 2019-01-06 MED ORDER — METHYLPREDNISOLONE ACETATE 40 MG/ML IJ SUSP
40.0000 mg | INTRAMUSCULAR | Status: AC | PRN
  Administered 2019-01-06: 10:00:00 40 mg via INTRA_ARTICULAR

## 2019-01-06 NOTE — Progress Notes (Signed)
Office Visit Note   Patient: Shawn Frye           Date of Birth: 10/17/1918           MRN: [id number] Visit Date: 01/06/2019              Requested by: [name], MD [place],  [place] [id number] PCP: [name], MD   Assessment & Plan: Visit Diagnoses:  1. Primary osteoarthritis, right shoulder   2. Trochanteric bursitis, right hip     Plan: Older.  Will reinject.  Also having recurrent symptoms of greater trochanteric bursitis right hip.  Will inject that as well.  Plan to see back as needed  Follow-Up Instructions: Return if symptoms worsen or fail to improve.   Orders:  No orders of the defined types were placed in this encounter.  No orders of the defined types were placed in this encounter.     Procedures: Large Joint Inj: R glenohumeral on 01/06/2019 9:46 AM Indications: pain and diagnostic evaluation Details: 25 G 1.5 in needle, anterior approach  Arthrogram: No  Medications: 2 mL bupivacaine 0.5 %; 2 mL lidocaine 1 %; 80 mg methylPREDNISolone acetate 40 MG/ML Consent was given by the patient. Immediately prior to procedure a time out was called to verify the correct patient, procedure, equipment, support staff and site/side marked as required. Patient was prepped and draped in the usual sterile fashion.   Large Joint Inj: R greater trochanter on 01/06/2019 9:47 AM Indications: pain and diagnostic evaluation Details: 25 G 1.5 in needle  Arthrogram: No  Medications: 2 mL bupivacaine 0.5 %; 2 mL lidocaine 1 %; 40 mg methylPREDNISolone acetate 40 MG/ML Procedure, treatment alternatives, risks and benefits explained, specific risks discussed. Consent was given by the patient. Immediately prior to procedure a time out was called to verify the correct patient, procedure, equipment, support staff and site/side marked as required. Patient was prepped and draped in the usual sterile fashion.       Clinical Data: No additional findings.    Subjective: No chief complaint on file. Shawn Frye has an established history of osteoarthritis right shoulder probably on the basis of rotator cuff disease.  I have injected her shoulder in the past with good relief and is here for another injection.  He also relates having some discomfort directly over the lateral aspect of his right hip.  Has had a prior diagnosis of greater trochanteric bursitis no numbness or tingling.  HPI  Review of Systems   Objective: Vital Signs: There were no vitals taken for this visit.  Physical Exam Constitutional:      Appearance: He is well-developed.  Eyes:     Pupils: Pupils are equal, round, and reactive to light.  Pulmonary:     Effort: Pulmonary effort is normal.  Skin:    General: Skin is warm and dry.  Neurological:     Mental Status: He is alert and oriented to person, place, and time.  Psychiatric:        Behavior: Behavior normal.     Ortho Exam very thin.  Is hard of hearing.  Accompanied by nursing staff.  Pain directly over the greater trochanter of his right hip.  No loss of motion.  Straight leg raise negative.  Skin intact.  No percussible tenderness of lumbar spine.  No change in exam right shoulder.  Does have limited overhead range of motion passively and actively with some crepitation.  Considerable  atrophy about both shoulders.  Multiple atrophic skin changes and age-related lesions  Specialty Comments:  No specialty comments available.  Imaging: No results found.   PMFS History: Patient Active Problem List   Diagnosis Date Noted  . Trochanteric bursitis, right hip 01/06/2019  . Dysphagia   . Pleural effusion on right   . Pleural effusion 01/23/2018  . CHF (congestive heart failure) ([place]) 01/23/2018  . Malnutrition of moderate degree 01/14/2018  . Shortness of breath   . HCAP (healthcare-associated pneumonia) 01/12/2018  . Pneumonia 01/12/2018  . Chronic systolic CHF (congestive heart failure) ([place]) 01/12/2018   . Elevated troponin 01/12/2018  . Acute on chronic combined systolic and diastolic CHF (congestive heart failure) ([place]) 12/14/2017  . Acute respiratory failure with hypoxia ([place]) 12/13/2017  . Acute on chronic systolic CHF (congestive heart failure) ([place]) 12/13/2017  . Dilated cardiomyopathy ([place]) 12/13/2017  . Pacemaker 12/13/2017  . Hyponatremia 12/13/2017  . Primary osteoarthritis, right shoulder 01/08/2017  . Mobitz type 2 second degree atrioventricular block 10/02/2015  . Bradycardia 08/29/2015  . Aneurysm of abdominal vessel ([place]) 03/21/2011   Past Medical History:  Diagnosis Date  . AAA (abdominal aortic aneurysm) ([place])   . Arthritis   . Chronic combined systolic (congestive) and diastolic (congestive) heart failure ([place])    a. 10/2017: echo showing a reduced EF of 25%, diffuse HK with akinesis of the apical myocardium, Grade 2 DD, mild AI, mild to moderate MR, and mild to moderate TR  . GERD (gastroesophageal reflux disease)   . Heart block AV second degree    St. Jude pacemaker May 2017 - Dr. [name]  . Infection of bladder   . Joint pain   . Symptomatic bradycardia     Family History  Problem Relation Age of Onset  . Heart disease Father        NOT  before age 58  . Pneumonia Mother   . Cystic fibrosis Paternal Aunt   . Kidney disease Neg Hx     Past Surgical History:  Procedure Laterality Date  . BACK SURGERY    . BIOPSY  01/28/2018   Procedure: BIOPSY;  Surgeon: Rogene Houston, MD;  [facility];  Service: Endoscopy;;  pre-pyloric  . CATARACT EXTRACTION W/ INTRAOCULAR LENS  IMPLANT, BILATERAL Bilateral   . EP IMPLANTABLE DEVICE N/A 10/02/2015   Procedure: Pacemaker Implant;  Surgeon: Evans Lance, MD;  Location: [facility];  Service: Cardiovascular;  Laterality: N/A;  . ESOPHAGOGASTRODUODENOSCOPY N/A 01/28/2018   Procedure: ESOPHAGOGASTRODUODENOSCOPY (EGD);  Surgeon: Rogene Houston, MD;  [facility];  Service: Endoscopy;   Laterality: N/A;  . EXCISIONAL HEMORRHOIDECTOMY    . HEMORROIDECTOMY    . INGUINAL HERNIA REPAIR Bilateral 1991  . INSERT / REPLACE / REMOVE PACEMAKER    . JOINT REPLACEMENT    . [place] SURGERY  2005  . TOTAL KNEE ARTHROPLASTY Bilateral 1998 -  2003  . TOTAL SHOULDER REPLACEMENT Left 1999   Partial  . TYMPANOSTOMY TUBE PLACEMENT Right    Social History   Occupational History  . Not on file  Tobacco Use  . Smoking status: Former Smoker    Packs/day: 1.00    Years: 15.00    Pack years: 15.00    Types: Cigarettes    Quit date: 05/13/1950    Years since quitting: 68.6  . Smokeless tobacco: Never Used  Substance and Sexual Activity  . Alcohol use: No    Alcohol/week: 0.0 standard drinks  .  Drug use: No  . Sexual activity: Never     Garald Balding, MD   Note - This record has been created using [employer].  Chart creation errors have been sought, but may not always  have been located. Such creation errors do not reflect on  the standard of medical care.

## 2019-01-12 DIAGNOSIS — R0602 Shortness of breath: Secondary | ICD-10-CM | POA: Diagnosis not present

## 2019-01-12 DIAGNOSIS — M19011 Primary osteoarthritis, right shoulder: Secondary | ICD-10-CM | POA: Diagnosis not present

## 2019-01-12 DIAGNOSIS — K219 Gastro-esophageal reflux disease without esophagitis: Secondary | ICD-10-CM | POA: Diagnosis not present

## 2019-01-12 DIAGNOSIS — I499 Cardiac arrhythmia, unspecified: Secondary | ICD-10-CM | POA: Diagnosis not present

## 2019-01-12 DIAGNOSIS — I420 Dilated cardiomyopathy: Secondary | ICD-10-CM | POA: Diagnosis not present

## 2019-01-12 DIAGNOSIS — I509 Heart failure, unspecified: Secondary | ICD-10-CM | POA: Diagnosis not present

## 2019-01-12 DIAGNOSIS — R531 Weakness: Secondary | ICD-10-CM | POA: Diagnosis not present

## 2019-01-12 DIAGNOSIS — N400 Enlarged prostate without lower urinary tract symptoms: Secondary | ICD-10-CM | POA: Diagnosis not present

## 2019-01-12 DIAGNOSIS — I714 Abdominal aortic aneurysm, without rupture: Secondary | ICD-10-CM | POA: Diagnosis not present

## 2019-01-12 DIAGNOSIS — J449 Chronic obstructive pulmonary disease, unspecified: Secondary | ICD-10-CM | POA: Diagnosis not present

## 2019-01-20 ENCOUNTER — Ambulatory Visit: Payer: BLUE CROSS/BLUE SHIELD | Admitting: Cardiology

## 2019-01-21 DIAGNOSIS — K219 Gastro-esophageal reflux disease without esophagitis: Secondary | ICD-10-CM | POA: Diagnosis not present

## 2019-01-21 DIAGNOSIS — N400 Enlarged prostate without lower urinary tract symptoms: Secondary | ICD-10-CM | POA: Diagnosis not present

## 2019-01-21 DIAGNOSIS — I714 Abdominal aortic aneurysm, without rupture: Secondary | ICD-10-CM | POA: Diagnosis not present

## 2019-01-21 DIAGNOSIS — I509 Heart failure, unspecified: Secondary | ICD-10-CM | POA: Diagnosis not present

## 2019-01-21 DIAGNOSIS — I420 Dilated cardiomyopathy: Secondary | ICD-10-CM | POA: Diagnosis not present

## 2019-01-21 DIAGNOSIS — I499 Cardiac arrhythmia, unspecified: Secondary | ICD-10-CM | POA: Diagnosis not present

## 2019-01-25 DIAGNOSIS — I420 Dilated cardiomyopathy: Secondary | ICD-10-CM | POA: Diagnosis not present

## 2019-01-25 DIAGNOSIS — I714 Abdominal aortic aneurysm, without rupture: Secondary | ICD-10-CM | POA: Diagnosis not present

## 2019-01-25 DIAGNOSIS — K219 Gastro-esophageal reflux disease without esophagitis: Secondary | ICD-10-CM | POA: Diagnosis not present

## 2019-01-25 DIAGNOSIS — I509 Heart failure, unspecified: Secondary | ICD-10-CM | POA: Diagnosis not present

## 2019-01-25 DIAGNOSIS — I499 Cardiac arrhythmia, unspecified: Secondary | ICD-10-CM | POA: Diagnosis not present

## 2019-01-25 DIAGNOSIS — N400 Enlarged prostate without lower urinary tract symptoms: Secondary | ICD-10-CM | POA: Diagnosis not present

## 2019-02-01 ENCOUNTER — Ambulatory Visit (INDEPENDENT_AMBULATORY_CARE_PROVIDER_SITE_OTHER): Payer: Medicare Other | Admitting: *Deleted

## 2019-02-01 DIAGNOSIS — I441 Atrioventricular block, second degree: Secondary | ICD-10-CM | POA: Diagnosis not present

## 2019-02-02 LAB — CUP PACEART REMOTE DEVICE CHECK
Battery Remaining Longevity: 97 mo
Battery Remaining Percentage: 95.5 %
Battery Voltage: 2.98 V
Brady Statistic AP VP Percent: 1 %
Brady Statistic AP VS Percent: 1 %
Brady Statistic AS VP Percent: 99 %
Brady Statistic AS VS Percent: 1 %
Brady Statistic RA Percent Paced: 1 %
Brady Statistic RV Percent Paced: 99 %
Date Time Interrogation Session: 20200922155507
Implantable Lead Implant Date: 20170522
Implantable Lead Implant Date: 20170522
Implantable Lead Location: 753859
Implantable Lead Location: 753860
Implantable Pulse Generator Implant Date: 20170522
Lead Channel Impedance Value: 350 Ohm
Lead Channel Impedance Value: 390 Ohm
Lead Channel Pacing Threshold Amplitude: 0.75 V
Lead Channel Pacing Threshold Amplitude: 0.75 V
Lead Channel Pacing Threshold Pulse Width: 0.5 ms
Lead Channel Pacing Threshold Pulse Width: 0.5 ms
Lead Channel Sensing Intrinsic Amplitude: 0.6 mV
Lead Channel Sensing Intrinsic Amplitude: 4.9 mV
Lead Channel Setting Pacing Amplitude: 2 V
Lead Channel Setting Pacing Amplitude: 2.5 V
Lead Channel Setting Pacing Pulse Width: 0.5 ms
Lead Channel Setting Sensing Sensitivity: 2 mV
Pulse Gen Model: 2272
Pulse Gen Serial Number: 7899864

## 2019-02-03 DIAGNOSIS — I714 Abdominal aortic aneurysm, without rupture: Secondary | ICD-10-CM | POA: Diagnosis not present

## 2019-02-03 DIAGNOSIS — K219 Gastro-esophageal reflux disease without esophagitis: Secondary | ICD-10-CM | POA: Diagnosis not present

## 2019-02-03 DIAGNOSIS — I509 Heart failure, unspecified: Secondary | ICD-10-CM | POA: Diagnosis not present

## 2019-02-03 DIAGNOSIS — N400 Enlarged prostate without lower urinary tract symptoms: Secondary | ICD-10-CM | POA: Diagnosis not present

## 2019-02-03 DIAGNOSIS — I420 Dilated cardiomyopathy: Secondary | ICD-10-CM | POA: Diagnosis not present

## 2019-02-03 DIAGNOSIS — I499 Cardiac arrhythmia, unspecified: Secondary | ICD-10-CM | POA: Diagnosis not present

## 2019-02-08 ENCOUNTER — Encounter: Payer: Self-pay | Admitting: Cardiology

## 2019-02-08 NOTE — Progress Notes (Signed)
Remote pacemaker transmission.   

## 2019-02-10 ENCOUNTER — Ambulatory Visit: Payer: BLUE CROSS/BLUE SHIELD | Admitting: Cardiology

## 2019-02-11 ENCOUNTER — Ambulatory Visit: Payer: BLUE CROSS/BLUE SHIELD | Admitting: Cardiology

## 2019-02-11 DIAGNOSIS — I499 Cardiac arrhythmia, unspecified: Secondary | ICD-10-CM | POA: Diagnosis not present

## 2019-02-11 DIAGNOSIS — R531 Weakness: Secondary | ICD-10-CM | POA: Diagnosis not present

## 2019-02-11 DIAGNOSIS — K5900 Constipation, unspecified: Secondary | ICD-10-CM | POA: Diagnosis not present

## 2019-02-11 DIAGNOSIS — J449 Chronic obstructive pulmonary disease, unspecified: Secondary | ICD-10-CM | POA: Diagnosis not present

## 2019-02-11 DIAGNOSIS — G4700 Insomnia, unspecified: Secondary | ICD-10-CM | POA: Diagnosis not present

## 2019-02-11 DIAGNOSIS — N400 Enlarged prostate without lower urinary tract symptoms: Secondary | ICD-10-CM | POA: Diagnosis not present

## 2019-02-11 DIAGNOSIS — M19011 Primary osteoarthritis, right shoulder: Secondary | ICD-10-CM | POA: Diagnosis not present

## 2019-02-11 DIAGNOSIS — R110 Nausea: Secondary | ICD-10-CM | POA: Diagnosis not present

## 2019-02-11 DIAGNOSIS — I509 Heart failure, unspecified: Secondary | ICD-10-CM | POA: Diagnosis not present

## 2019-02-11 DIAGNOSIS — K219 Gastro-esophageal reflux disease without esophagitis: Secondary | ICD-10-CM | POA: Diagnosis not present

## 2019-02-11 DIAGNOSIS — I714 Abdominal aortic aneurysm, without rupture: Secondary | ICD-10-CM | POA: Diagnosis not present

## 2019-02-11 DIAGNOSIS — R0602 Shortness of breath: Secondary | ICD-10-CM | POA: Diagnosis not present

## 2019-02-11 DIAGNOSIS — I420 Dilated cardiomyopathy: Secondary | ICD-10-CM | POA: Diagnosis not present

## 2019-02-15 DIAGNOSIS — Z23 Encounter for immunization: Secondary | ICD-10-CM | POA: Diagnosis not present

## 2019-02-16 DIAGNOSIS — I714 Abdominal aortic aneurysm, without rupture: Secondary | ICD-10-CM | POA: Diagnosis not present

## 2019-02-16 DIAGNOSIS — K219 Gastro-esophageal reflux disease without esophagitis: Secondary | ICD-10-CM | POA: Diagnosis not present

## 2019-02-16 DIAGNOSIS — N400 Enlarged prostate without lower urinary tract symptoms: Secondary | ICD-10-CM | POA: Diagnosis not present

## 2019-02-16 DIAGNOSIS — I499 Cardiac arrhythmia, unspecified: Secondary | ICD-10-CM | POA: Diagnosis not present

## 2019-02-16 DIAGNOSIS — I420 Dilated cardiomyopathy: Secondary | ICD-10-CM | POA: Diagnosis not present

## 2019-02-16 DIAGNOSIS — I509 Heart failure, unspecified: Secondary | ICD-10-CM | POA: Diagnosis not present

## 2019-02-19 DIAGNOSIS — I499 Cardiac arrhythmia, unspecified: Secondary | ICD-10-CM | POA: Diagnosis not present

## 2019-02-19 DIAGNOSIS — I420 Dilated cardiomyopathy: Secondary | ICD-10-CM | POA: Diagnosis not present

## 2019-02-19 DIAGNOSIS — N400 Enlarged prostate without lower urinary tract symptoms: Secondary | ICD-10-CM | POA: Diagnosis not present

## 2019-02-19 DIAGNOSIS — I509 Heart failure, unspecified: Secondary | ICD-10-CM | POA: Diagnosis not present

## 2019-02-19 DIAGNOSIS — K219 Gastro-esophageal reflux disease without esophagitis: Secondary | ICD-10-CM | POA: Diagnosis not present

## 2019-02-19 DIAGNOSIS — I714 Abdominal aortic aneurysm, without rupture: Secondary | ICD-10-CM | POA: Diagnosis not present

## 2019-02-24 DIAGNOSIS — N400 Enlarged prostate without lower urinary tract symptoms: Secondary | ICD-10-CM | POA: Diagnosis not present

## 2019-02-24 DIAGNOSIS — I420 Dilated cardiomyopathy: Secondary | ICD-10-CM | POA: Diagnosis not present

## 2019-02-24 DIAGNOSIS — I509 Heart failure, unspecified: Secondary | ICD-10-CM | POA: Diagnosis not present

## 2019-02-24 DIAGNOSIS — I714 Abdominal aortic aneurysm, without rupture: Secondary | ICD-10-CM | POA: Diagnosis not present

## 2019-02-24 DIAGNOSIS — K219 Gastro-esophageal reflux disease without esophagitis: Secondary | ICD-10-CM | POA: Diagnosis not present

## 2019-02-24 DIAGNOSIS — I499 Cardiac arrhythmia, unspecified: Secondary | ICD-10-CM | POA: Diagnosis not present

## 2019-03-01 ENCOUNTER — Encounter: Payer: Self-pay | Admitting: Cardiology

## 2019-03-01 ENCOUNTER — Ambulatory Visit (INDEPENDENT_AMBULATORY_CARE_PROVIDER_SITE_OTHER): Admitting: Cardiology

## 2019-03-01 ENCOUNTER — Other Ambulatory Visit (HOSPITAL_COMMUNITY)
Admission: RE | Admit: 2019-03-01 | Discharge: 2019-03-01 | Disposition: A | Discharge status: Home / Self Care | Payer: Medicare Other | Source: Ambulatory Visit | Attending: Cardiology | Admitting: Cardiology

## 2019-03-01 ENCOUNTER — Other Ambulatory Visit: Payer: Self-pay

## 2019-03-01 VITALS — BP 92/58 | HR 85 | Temp 97.3°F | Ht 67.0 in | Wt 137.0 lb

## 2019-03-01 DIAGNOSIS — N1832 Chronic kidney disease, stage 3b: Secondary | ICD-10-CM | POA: Diagnosis not present

## 2019-03-01 DIAGNOSIS — I714 Abdominal aortic aneurysm, without rupture: Secondary | ICD-10-CM | POA: Diagnosis not present

## 2019-03-01 DIAGNOSIS — I5042 Chronic combined systolic (congestive) and diastolic (congestive) heart failure: Secondary | ICD-10-CM | POA: Diagnosis not present

## 2019-03-01 DIAGNOSIS — I499 Cardiac arrhythmia, unspecified: Secondary | ICD-10-CM | POA: Diagnosis not present

## 2019-03-01 DIAGNOSIS — I509 Heart failure, unspecified: Secondary | ICD-10-CM | POA: Diagnosis not present

## 2019-03-01 DIAGNOSIS — I420 Dilated cardiomyopathy: Secondary | ICD-10-CM | POA: Diagnosis not present

## 2019-03-01 DIAGNOSIS — I441 Atrioventricular block, second degree: Secondary | ICD-10-CM

## 2019-03-01 DIAGNOSIS — N400 Enlarged prostate without lower urinary tract symptoms: Secondary | ICD-10-CM | POA: Diagnosis not present

## 2019-03-01 DIAGNOSIS — K219 Gastro-esophageal reflux disease without esophagitis: Secondary | ICD-10-CM | POA: Diagnosis not present

## 2019-03-01 LAB — BASIC METABOLIC PANEL
Anion gap: 9 (ref 5–15)
BUN: 24 mg/dL — ABNORMAL HIGH (ref 8–23)
CO2: 19 mmol/L — ABNORMAL LOW (ref 22–32)
Calcium: 8.7 mg/dL — ABNORMAL LOW (ref 8.9–10.3)
Chloride: 109 mmol/L (ref 98–111)
Creatinine, Ser: 1.21 mg/dL (ref 0.61–1.24)
GFR calc Af Amer: 58 mL/min — ABNORMAL LOW (ref >60)
GFR calc non Af Amer: 50 mL/min — ABNORMAL LOW (ref >60)
Glucose, Bld: 108 mg/dL — ABNORMAL HIGH (ref 70–99)
Potassium: 4.9 mmol/L (ref 3.5–5.1)
Sodium: 137 mmol/L (ref 135–145)

## 2019-03-01 MED ORDER — TORSEMIDE 20 MG PO TABS: 20.0000 mg | ORAL_TABLET | ORAL | 3 refills | Status: AC | PRN

## 2019-03-01 NOTE — Progress Notes (Signed)
Cardiology Office Note  Date: 03/01/2019   ID: Shawn Frye, DOB 05/19/1918, MRN [id number]  PCP:  [name], MD  Cardiologist:  Rozann Lesches, MD Electrophysiologist:  None   Chief Complaint  Patient presents with  . Cardiac follow-up    History of Present Illness: Shawn Frye is a 85 y.o. male last seen by Ms. Strader PA-C in February.  He is here today with an Environmental consultant.  He is in a wheelchair, remains sedentary at baseline.  His weight has been quite stable however.  He is now receiving Demadex 20 mg on an as-needed basis for 3 pound weight gain.  He otherwise remains on Aldactone.  He has also been getting a regular potassium supplement and has not had recent lab work.  Follow-up visit with Dr. [name] noted in August, [name] pacemaker in place for treatment of symptomatic second-degree heart block.  Most recent device check showed normal function.  Echocardiogram from August of last year is reviewed below, we are managing him conservatively and do not plan follow-up testing at this time.  He has been in the hospice program.  Past Medical History:  Diagnosis Date  . AAA (abdominal aortic aneurysm) ([place])   . Arthritis   . Chronic combined systolic (congestive) and diastolic (congestive) heart failure ([place])    a. 10/2017: echo showing a reduced EF of 25%, diffuse HK with akinesis of the apical myocardium, Grade 2 DD, mild AI, mild to moderate MR, and mild to moderate TR  . GERD (gastroesophageal reflux disease)   . Heart block AV second degree    St. Jude pacemaker May 2017 - Dr. [name]  . Infection of bladder   . Joint pain   . Symptomatic bradycardia     Past Surgical History:  Procedure Laterality Date  . BACK SURGERY    . BIOPSY  01/28/2018   Procedure: BIOPSY;  Surgeon: Rogene Houston, MD;  [facility];  Service: Endoscopy;;  pre-pyloric  . CATARACT EXTRACTION W/ INTRAOCULAR LENS  IMPLANT, BILATERAL Bilateral   . EP IMPLANTABLE DEVICE N/A  10/02/2015   Procedure: Pacemaker Implant;  Surgeon: Evans Lance, MD;  Location: [facility];  Service: Cardiovascular;  Laterality: N/A;  . ESOPHAGOGASTRODUODENOSCOPY N/A 01/28/2018   Procedure: ESOPHAGOGASTRODUODENOSCOPY (EGD);  Surgeon: Rogene Houston, MD;  [facility];  Service: Endoscopy;  Laterality: N/A;  . EXCISIONAL HEMORRHOIDECTOMY    . HEMORROIDECTOMY    . INGUINAL HERNIA REPAIR Bilateral 1991  . INSERT / REPLACE / REMOVE PACEMAKER    . JOINT REPLACEMENT    . [place] SURGERY  2005  . TOTAL KNEE ARTHROPLASTY Bilateral 1998 -  2003  . TOTAL SHOULDER REPLACEMENT Left 1999   Partial  . TYMPANOSTOMY TUBE PLACEMENT Right     Current Outpatient Medications  Medication Sig Dispense Refill  . ALPRAZolam (XANAX) 0.25 MG tablet Take 0.125-0.25 mg by mouth daily as needed for anxiety.    . feeding supplement, ENSURE ENLIVE, (ENSURE ENLIVE) LIQD Take 237 mLs by mouth 2 (two) times daily between meals.    Marland Kitchen loratadine (CLARITIN) 10 MG tablet Take 10 mg by mouth daily.    . pantoprazole (PROTONIX) 40 MG tablet Take 1 tablet (40 mg total) by mouth daily. 30 tablet 1  . potassium chloride SA (KLOR-CON) 20 MEQ tablet Take 20 mEq by mouth daily.    Marland Kitchen spironolactone (ALDACTONE) 25 MG tablet Take 25 mg by mouth 2 (two) times daily.    Marland Kitchen  torsemide (DEMADEX) 20 MG tablet Take 1 tablet (20 mg total) by mouth as needed. For wt gain over 3 lbs in 24 hrs 90 tablet 3   No current facility-administered medications for this visit.    Allergies:  Percocet [oxycodone-acetaminophen], Noroxin [norfloxacin], and Celebrex [celecoxib]   Social History: The patient  reports that he quit smoking about 68 years ago. His smoking use included cigarettes. He has a 15.00 pack-year smoking history. He has never used smokeless tobacco. He reports that he does not drink alcohol or use drugs.   ROS:  Please see the history of present illness. Otherwise, complete review of systems is positive for  hearing loss.  All other systems are reviewed and negative.   Physical Exam: VS:  BP (!) 92/58   Pulse 85   Temp (!) 97.3 F (36.3 C) (Temporal)   Ht 5\' 7"  (1.702 m)   Wt 137 lb (62.1 kg)   SpO2 100%   BMI 21.46 kg/m , BMI Body mass index is 21.46 kg/m.  Wt Readings from Last 3 Encounters:  03/01/19 137 lb (62.1 kg)  01/06/19 140 lb (63.5 kg)  12/30/18 138 lb (62.6 kg)    General: Elderly male in wheelchair, appears comfortable at rest. HEENT: Conjunctiva and lids normal, wearing a mask. Neck: Supple, no elevated JVP or carotid bruits, no thyromegaly. Lungs: Clear to auscultation, nonlabored breathing at rest. Cardiac: Regular rate and rhythm, no S3, 2/6 systolic and diastolic murmurs, no pericardial rub. Abdomen: Soft, nontender, bowel sounds present. Extremities: 1+ lower leg edema, distal pulses 2+. Skin: Warm and dry. Musculoskeletal: Kyphosis as before. Neuropsychiatric: Alert and oriented x3, affect grossly appropriate.  ECG:  An ECG dated 06/02/2018 was personally reviewed today and demonstrated:  Sinus rhythm with ventricular pacing.  Recent Labwork: 06/02/2018: ALT 12; AST 22; B Natriuretic Peptide 1,695.0; Hemoglobin 9.9; Platelets 163 07/09/2018: BUN 30; Creatinine, Ser 1.38; Potassium 3.3; Sodium 134   Other Studies Reviewed Today:  Echocardiogram 12/14/2017: Study Conclusions  - Left ventricle: The cavity size was mildly dilated. Wall   thickness was normal. Systolic function was severely reduced. The   estimated ejection fraction was 20%. Diffuse hypokinesis.   Features are consistent with a pseudonormal left ventricular   filling pattern, with concomitant abnormal relaxation and   increased filling pressure (grade 2 diastolic dysfunction).   Doppler parameters are consistent with high ventricular filling   pressure. - Regional wall motion abnormality: Akinesis of the apical   myocardium. - Ventricular septum: Septal motion showed abnormal function and    dyssynergy. - Aortic valve: Moderately calcified annulus. Trileaflet. There was   mild regurgitation. - Aorta: Mild aortic root enlargement. - Mitral valve: Calcified annulus. There was moderate   regurgitation. - Left atrium: The atrium was severely dilated. - Right ventricle: Pacer wire or catheter noted in right ventricle.   Systolic function was mildly to moderately reduced. - Right atrium: The atrium was moderately dilated. - Tricuspid valve: There was moderate-severe regurgitation. - Pulmonic valve: There was mild regurgitation. - Pulmonary arteries: PA peak pressure: 56 mm Hg (S). - Inferior vena cava: The vessel was dilated. The respirophasic   diameter changes were blunted (< 50%), consistent with elevated   central venous pressure.  Assessment and Plan:  1.  Chronic combined heart failure with LVEF 20% as well as RV dysfunction and pulmonary hypertension.  We are managing him conservatively, he has been in the hospice program.  Current regimen includes spironolactone and as needed use of Demadex.  He has been on a potassium supplement, will check BMET.  Blood pressures run low and we have held off beta-blocker as well as ARB/ANRI.  2.  Symptomatic second-degree heart block status post St. Jude pacemaker.  He continues to follow with Dr. [name].  Medication Adjustments/Labs and Tests Ordered: Current medicines are reviewed at length with the patient today.  Concerns regarding medicines are outlined above.   Tests Ordered: Orders Placed This Encounter  Procedures  . Basic Metabolic Panel (BMET)    Medication Changes: Meds ordered this encounter  Medications  . torsemide (DEMADEX) 20 MG tablet    Sig: Take 1 tablet (20 mg total) by mouth as needed. For wt gain over 3 lbs in 24 hrs    Dispense:  90 tablet    Refill:  3    Disposition:  Follow up 6 months in the [place] office.  Signed, Satira Sark, MD, [facility] 03/01/2019 9:56 AM    [facility] [place], [place], [place] [address] Phone: [phone]; Fax: [phone]

## 2019-03-01 NOTE — Patient Instructions (Signed)
Medication Instructions:  Your physician recommends that you continue on your current medications as directed. Please refer to the Current Medication list given to you today.  *If you need a refill on your cardiac medications before your next appointment, please call your pharmacy*  Lab Work: BMET today If you have labs (blood work) drawn today and your tests are completely normal, you will receive your results only by: Marland Kitchen MyChart Message (if you have MyChart) OR . A paper copy in the mail If you have any lab test that is abnormal or we need to change your treatment, we will call you to review the results.  Testing/Procedures: None  Follow-Up: At [facility], you and your health needs are our priority.  As part of our continuing mission to provide you with exceptional heart care, we have created designated Provider Care Teams.  These Care Teams include your primary Cardiologist (physician) and Advanced Practice Providers (APPs -  Physician Assistants and Nurse Practitioners) who all work together to provide you with the care you need, when you need it.  Your next appointment:   6 months  The format for your next appointment:   In Person  Provider:   Rozann Lesches, MD  Other Instructions None      Thank you for choosing [facility] !

## 2019-03-03 DIAGNOSIS — I420 Dilated cardiomyopathy: Secondary | ICD-10-CM | POA: Diagnosis not present

## 2019-03-03 DIAGNOSIS — I509 Heart failure, unspecified: Secondary | ICD-10-CM | POA: Diagnosis not present

## 2019-03-03 DIAGNOSIS — H43391 Other vitreous opacities, right eye: Secondary | ICD-10-CM | POA: Diagnosis not present

## 2019-03-03 DIAGNOSIS — N400 Enlarged prostate without lower urinary tract symptoms: Secondary | ICD-10-CM | POA: Diagnosis not present

## 2019-03-03 DIAGNOSIS — K219 Gastro-esophageal reflux disease without esophagitis: Secondary | ICD-10-CM | POA: Diagnosis not present

## 2019-03-03 DIAGNOSIS — I499 Cardiac arrhythmia, unspecified: Secondary | ICD-10-CM | POA: Diagnosis not present

## 2019-03-03 DIAGNOSIS — I714 Abdominal aortic aneurysm, without rupture: Secondary | ICD-10-CM | POA: Diagnosis not present

## 2019-03-10 ENCOUNTER — Encounter (HOSPITAL_COMMUNITY): Payer: Self-pay | Admitting: Emergency Medicine

## 2019-03-10 ENCOUNTER — Inpatient Hospital Stay (HOSPITAL_COMMUNITY)
Admission: EM | Admit: 2019-03-10 | Discharge: 2019-03-16 | DRG: 177 | Disposition: A | Discharge status: Home Health | Payer: Medicare Other | Attending: Internal Medicine | Admitting: Internal Medicine

## 2019-03-10 ENCOUNTER — Emergency Department (HOSPITAL_COMMUNITY): Payer: Medicare Other

## 2019-03-10 ENCOUNTER — Other Ambulatory Visit: Payer: Self-pay

## 2019-03-10 DIAGNOSIS — I5043 Acute on chronic combined systolic (congestive) and diastolic (congestive) heart failure: Secondary | ICD-10-CM | POA: Diagnosis present

## 2019-03-10 DIAGNOSIS — I420 Dilated cardiomyopathy: Secondary | ICD-10-CM | POA: Diagnosis not present

## 2019-03-10 DIAGNOSIS — K219 Gastro-esophageal reflux disease without esophagitis: Secondary | ICD-10-CM | POA: Diagnosis present

## 2019-03-10 DIAGNOSIS — I441 Atrioventricular block, second degree: Secondary | ICD-10-CM | POA: Diagnosis not present

## 2019-03-10 DIAGNOSIS — Z886 Allergy status to analgesic agent status: Secondary | ICD-10-CM

## 2019-03-10 DIAGNOSIS — J1289 Other viral pneumonia: Secondary | ICD-10-CM | POA: Diagnosis present

## 2019-03-10 DIAGNOSIS — I509 Heart failure, unspecified: Secondary | ICD-10-CM

## 2019-03-10 DIAGNOSIS — Z96653 Presence of artificial knee joint, bilateral: Secondary | ICD-10-CM | POA: Diagnosis present

## 2019-03-10 DIAGNOSIS — I714 Abdominal aortic aneurysm, without rupture, unspecified: Secondary | ICD-10-CM | POA: Diagnosis present

## 2019-03-10 DIAGNOSIS — Z881 Allergy status to other antibiotic agents status: Secondary | ICD-10-CM

## 2019-03-10 DIAGNOSIS — N400 Enlarged prostate without lower urinary tract symptoms: Secondary | ICD-10-CM | POA: Diagnosis not present

## 2019-03-10 DIAGNOSIS — Z6821 Body mass index (BMI) 21.0-21.9, adult: Secondary | ICD-10-CM

## 2019-03-10 DIAGNOSIS — U071 COVID-19: Principal | ICD-10-CM | POA: Diagnosis present

## 2019-03-10 DIAGNOSIS — H9190 Unspecified hearing loss, unspecified ear: Secondary | ICD-10-CM | POA: Diagnosis present

## 2019-03-10 DIAGNOSIS — R54 Age-related physical debility: Secondary | ICD-10-CM | POA: Diagnosis present

## 2019-03-10 DIAGNOSIS — D649 Anemia, unspecified: Secondary | ICD-10-CM

## 2019-03-10 DIAGNOSIS — Z515 Encounter for palliative care: Secondary | ICD-10-CM | POA: Diagnosis present

## 2019-03-10 DIAGNOSIS — Z8249 Family history of ischemic heart disease and other diseases of the circulatory system: Secondary | ICD-10-CM

## 2019-03-10 DIAGNOSIS — R0602 Shortness of breath: Secondary | ICD-10-CM | POA: Diagnosis present

## 2019-03-10 DIAGNOSIS — Z79899 Other long term (current) drug therapy: Secondary | ICD-10-CM

## 2019-03-10 DIAGNOSIS — Z950 Presence of cardiac pacemaker: Secondary | ICD-10-CM | POA: Diagnosis present

## 2019-03-10 DIAGNOSIS — I5023 Acute on chronic systolic (congestive) heart failure: Secondary | ICD-10-CM | POA: Diagnosis present

## 2019-03-10 DIAGNOSIS — J9601 Acute respiratory failure with hypoxia: Secondary | ICD-10-CM | POA: Diagnosis present

## 2019-03-10 DIAGNOSIS — Z66 Do not resuscitate: Secondary | ICD-10-CM | POA: Diagnosis present

## 2019-03-10 DIAGNOSIS — I499 Cardiac arrhythmia, unspecified: Secondary | ICD-10-CM | POA: Diagnosis not present

## 2019-03-10 DIAGNOSIS — Z87891 Personal history of nicotine dependence: Secondary | ICD-10-CM

## 2019-03-10 DIAGNOSIS — D638 Anemia in other chronic diseases classified elsewhere: Secondary | ICD-10-CM | POA: Diagnosis present

## 2019-03-10 DIAGNOSIS — Z96612 Presence of left artificial shoulder joint: Secondary | ICD-10-CM | POA: Diagnosis present

## 2019-03-10 DIAGNOSIS — Z885 Allergy status to narcotic agent status: Secondary | ICD-10-CM

## 2019-03-10 DIAGNOSIS — E440 Moderate protein-calorie malnutrition: Secondary | ICD-10-CM | POA: Diagnosis present

## 2019-03-10 LAB — CBC WITH DIFFERENTIAL/PLATELET
Abs Immature Granulocytes: 0.03 10*3/uL (ref 0.00–0.07)
Basophils Absolute: 0 10*3/uL (ref 0.0–0.1)
Basophils Relative: 0 %
Eosinophils Absolute: 0.1 10*3/uL (ref 0.0–0.5)
Eosinophils Relative: 2 %
HCT: 25.3 % — ABNORMAL LOW (ref 39.0–52.0)
Hemoglobin: 7.1 g/dL — ABNORMAL LOW (ref 13.0–17.0)
Immature Granulocytes: 1 %
Lymphocytes Relative: 18 %
Lymphs Abs: 0.8 10*3/uL (ref 0.7–4.0)
MCH: 19.3 pg — ABNORMAL LOW (ref 26.0–34.0)
MCHC: 28.1 g/dL — ABNORMAL LOW (ref 30.0–36.0)
MCV: 68.9 fL — ABNORMAL LOW (ref 80.0–100.0)
Monocytes Absolute: 0.8 10*3/uL (ref 0.1–1.0)
Monocytes Relative: 18 %
Neutro Abs: 2.7 10*3/uL (ref 1.7–7.7)
Neutrophils Relative %: 61 %
Platelets: 159 10*3/uL (ref 150–400)
RBC: 3.67 MIL/uL — ABNORMAL LOW (ref 4.22–5.81)
RDW: 23.9 % — ABNORMAL HIGH (ref 11.5–15.5)
WBC: 4.5 10*3/uL (ref 4.0–10.5)
nRBC: 0 % (ref 0.0–0.2)

## 2019-03-10 LAB — COMPREHENSIVE METABOLIC PANEL
ALT: 10 U/L (ref 0–44)
AST: 22 U/L (ref 15–41)
Albumin: 3.3 g/dL — ABNORMAL LOW (ref 3.5–5.0)
Alkaline Phosphatase: 142 U/L — ABNORMAL HIGH (ref 38–126)
Anion gap: 8 (ref 5–15)
BUN: 30 mg/dL — ABNORMAL HIGH (ref 8–23)
CO2: 18 mmol/L — ABNORMAL LOW (ref 22–32)
Calcium: 8.7 mg/dL — ABNORMAL LOW (ref 8.9–10.3)
Chloride: 106 mmol/L (ref 98–111)
Creatinine, Ser: 1.36 mg/dL — ABNORMAL HIGH (ref 0.61–1.24)
GFR calc Af Amer: 50 mL/min — ABNORMAL LOW (ref >60)
GFR calc non Af Amer: 43 mL/min — ABNORMAL LOW (ref >60)
Glucose, Bld: 102 mg/dL — ABNORMAL HIGH (ref 70–99)
Potassium: 4.8 mmol/L (ref 3.5–5.1)
Sodium: 132 mmol/L — ABNORMAL LOW (ref 135–145)
Total Bilirubin: 1.4 mg/dL — ABNORMAL HIGH (ref 0.3–1.2)
Total Protein: 7.8 g/dL (ref 6.5–8.1)

## 2019-03-10 LAB — POC OCCULT BLOOD, ED: Fecal Occult Bld: NEGATIVE

## 2019-03-10 LAB — C-REACTIVE PROTEIN: CRP: 1.4 mg/dL — ABNORMAL HIGH (ref <1.0)

## 2019-03-10 LAB — BRAIN NATRIURETIC PEPTIDE: B Natriuretic Peptide: 2263 pg/mL — ABNORMAL HIGH (ref 0.0–100.0)

## 2019-03-10 LAB — TROPONIN I (HIGH SENSITIVITY)
Troponin I (High Sensitivity): 13 ng/L (ref <18)
Troponin I (High Sensitivity): 13 ng/L (ref <18)

## 2019-03-10 LAB — PROCALCITONIN: Procalcitonin: <0.1 ng/mL

## 2019-03-10 MED ORDER — ENSURE ENLIVE PO LIQD
237.0000 mL | Freq: Two times a day (BID) | ORAL | Status: DC
  Administered 2019-03-11 – 2019-03-12 (×3): 237 mL via ORAL
  Filled 2019-03-10 (×4): qty 237

## 2019-03-10 MED ORDER — FUROSEMIDE 10 MG/ML IJ SOLN
20.0000 mg | Freq: Two times a day (BID) | INTRAMUSCULAR | Status: DC
  Administered 2019-03-11 – 2019-03-12 (×3): 20 mg via INTRAVENOUS
  Filled 2019-03-10 (×3): qty 2

## 2019-03-10 MED ORDER — ACETAMINOPHEN 500 MG PO TABS: 500.0000 mg | ORAL_TABLET | Freq: Every day | ORAL | Status: DC | PRN

## 2019-03-10 MED ORDER — ALPRAZOLAM 0.25 MG PO TABS
0.1250 mg | ORAL_TABLET | Freq: Every day | ORAL | Status: DC | PRN
  Administered 2019-03-12 – 2019-03-14 (×3): 0.25 mg via ORAL
  Filled 2019-03-10 (×3): qty 1

## 2019-03-10 MED ORDER — SPIRONOLACTONE 25 MG PO TABS
25.0000 mg | ORAL_TABLET | Freq: Two times a day (BID) | ORAL | Status: DC
  Administered 2019-03-11 – 2019-03-16 (×12): 25 mg via ORAL
  Filled 2019-03-10 (×14): qty 1

## 2019-03-10 MED ORDER — SODIUM CHLORIDE 0.9% FLUSH
3.0000 mL | Freq: Two times a day (BID) | INTRAVENOUS | Status: DC
  Administered 2019-03-10 – 2019-03-15 (×11): 3 mL via INTRAVENOUS
  Administered 2019-03-16: 21:00:00 via INTRAVENOUS
  Administered 2019-03-16: 08:00:00 3 mL via INTRAVENOUS

## 2019-03-10 MED ORDER — HEPARIN SODIUM (PORCINE) 5000 UNIT/ML IJ SOLN
5000.0000 [IU] | Freq: Three times a day (TID) | INTRAMUSCULAR | Status: DC
  Filled 2019-03-10: qty 1

## 2019-03-10 MED ORDER — POTASSIUM CHLORIDE 10 MEQ/100ML IV SOLN
10.0000 meq | Freq: Once | INTRAVENOUS | Status: AC
  Administered 2019-03-10: 10 meq via INTRAVENOUS
  Filled 2019-03-10: qty 100

## 2019-03-10 MED ORDER — SODIUM CHLORIDE 0.9% FLUSH: 3.0000 mL | INTRAVENOUS | Status: DC | PRN

## 2019-03-10 MED ORDER — ONDANSETRON HCL 4 MG/2ML IJ SOLN
4.0000 mg | Freq: Four times a day (QID) | INTRAMUSCULAR | Status: DC | PRN
  Administered 2019-03-11: 4 mg via INTRAVENOUS
  Filled 2019-03-10: qty 2

## 2019-03-10 MED ORDER — SODIUM CHLORIDE 0.9 % IV SOLN: 250.0000 mL | INTRAVENOUS | Status: DC | PRN

## 2019-03-10 MED ORDER — PANTOPRAZOLE SODIUM 40 MG PO TBEC
40.0000 mg | DELAYED_RELEASE_TABLET | Freq: Every day | ORAL | Status: DC
  Administered 2019-03-11 – 2019-03-16 (×6): 40 mg via ORAL
  Filled 2019-03-10 (×6): qty 1

## 2019-03-10 MED ORDER — ACETAMINOPHEN 325 MG PO TABS
650.0000 mg | ORAL_TABLET | ORAL | Status: DC | PRN
  Administered 2019-03-14: 650 mg via ORAL
  Filled 2019-03-10: qty 2

## 2019-03-10 NOTE — H&P (Signed)
TRH H&P   Patient Demographics:    Shawn Frye, is a 85 y.o. male  MRN: [id number]   DOB - January 02, 1919  Admit Date - 03/10/2019  Outpatient Primary MD for the patient is [name], MD  Referring MD/NP/PA: dr Minna Antis  Patient coming from: home patient is under [facility] hospice care at home  Chief Complaint  Patient presents with  . Cough      HPI:    Shawn Frye  is a 85 y.o. male, with medical history significant for cardiomyopathy with systolic congestive heart failure and LVEF 20%, cardiac pacemaker with history of second-degree AV block, AAA, iron deficiency anemia, patient has 24/7 caregiver, he is under [facility] care at home, patient had exposure to COVID-19 patient before 2 weeks, his caregiver came to get COVID-19 test to brought him with her in the car, she brought him to ED hoping he can get faster results on COVID-19, she does report he has chronic dyspnea at baseline, as well he has cough, productive with white phlegm at baseline as well, she denies him having any fever or chills, but overall he is generally weak. - in ED patient hemoglobin was 7.1, but Hemoccult negative, he had no hypoxia, but he had JVD, with significantly elevated proBNP and lower extremity edema, I was called to admit for further management.    Review of systems:    Unable to obtain appropriate review of system from the patient   With Past History of the following :    Past Medical History:  Diagnosis Date  . AAA (abdominal aortic aneurysm) ([place])   . Arthritis   . Chronic combined systolic (congestive) and diastolic (congestive) heart failure ([place])    a. 10/2017: echo showing a reduced EF of 25%, diffuse HK with akinesis of the apical myocardium, Grade 2 DD, mild AI, mild to moderate MR, and mild to moderate TR  . GERD (gastroesophageal reflux disease)   . Heart block AV  second degree    St. Jude pacemaker May 2017 - Dr. [name]  . Infection of bladder   . Joint pain   . Symptomatic bradycardia       Past Surgical History:  Procedure Laterality Date  . BACK SURGERY    . BIOPSY  01/28/2018   Procedure: BIOPSY;  Surgeon: Rogene Houston, MD;  [facility];  Service: Endoscopy;;  pre-pyloric  . CATARACT EXTRACTION W/ INTRAOCULAR LENS  IMPLANT, BILATERAL Bilateral   . EP IMPLANTABLE DEVICE N/A 10/02/2015   Procedure: Pacemaker Implant;  Surgeon: Evans Lance, MD;  Location: [place] CV LAB;  Service: Cardiovascular;  Laterality: N/A;  . ESOPHAGOGASTRODUODENOSCOPY N/A 01/28/2018   Procedure: ESOPHAGOGASTRODUODENOSCOPY (EGD);  Surgeon: Rogene Houston, MD;  [facility];  Service: Endoscopy;  Laterality: N/A;  . EXCISIONAL HEMORRHOIDECTOMY    . HEMORROIDECTOMY    . INGUINAL HERNIA REPAIR  Bilateral 1991  . INSERT / REPLACE / REMOVE PACEMAKER    . JOINT REPLACEMENT    . [place] SURGERY  2005  . TOTAL KNEE ARTHROPLASTY Bilateral 1998 -  2003  . TOTAL SHOULDER REPLACEMENT Left 1999   Partial  . TYMPANOSTOMY TUBE PLACEMENT Right       Social History:     Social History   Tobacco Use  . Smoking status: Former Smoker    Packs/day: 1.00    Years: 15.00    Pack years: 15.00    Types: Cigarettes    Quit date: 05/13/1950    Years since quitting: 68.8  . Smokeless tobacco: Never Used  Substance Use Topics  . Alcohol use: No    Alcohol/week: 0.0 standard drinks       Family History :     Family History  Problem Relation Age of Onset  . Heart disease Father        NOT  before age 29  . Pneumonia Mother   . Cystic fibrosis Paternal Aunt   . Kidney disease Neg Hx     Home Medications:   Prior to Admission medications   Medication Sig Start Date End Date Taking? Authorizing Provider  acetaminophen (TYLENOL) 500 MG tablet Take 500 mg by mouth daily as needed for mild pain or moderate pain.   Yes [provider]  ALPRAZolam (XANAX) 0.25 MG tablet Take 0.125-0.25 mg by mouth daily as needed for anxiety.   Yes [provider]  feeding supplement, ENSURE ENLIVE, (ENSURE ENLIVE) LIQD Take 237 mLs by mouth 2 (two) times daily between meals. 01/15/18  Yes Barton Dubois, MD  guaiFENesin (MUCINEX) 600 MG 12 hr tablet Take 600 mg by mouth daily.   Yes [provider]  loratadine (CLARITIN) 10 MG tablet Take 10 mg by mouth daily.   Yes [provider]  pantoprazole (PROTONIX) 40 MG tablet Take 1 tablet (40 mg total) by mouth daily. Patient taking differently: Take 40 mg by mouth every morning.  01/29/18 03/10/19 Yes Kathie Dike, MD  potassium chloride SA (KLOR-CON) 20 MEQ tablet Take 10 mEq by mouth 2 (two) times daily.    Yes [provider]  Pseudoeph-Doxylamine-DM-APAP (NYQUIL MULTI-SYMPTOM PO) Take 5-10 mLs by mouth at bedtime as needed (for symptoms).   Yes [provider]  spironolactone (ALDACTONE) 25 MG tablet Take 25 mg by mouth 2 (two) times daily.   Yes [provider]  torsemide (DEMADEX) 20 MG tablet Take 1 tablet (20 mg total) by mouth as needed. For wt gain over 3 lbs in 24 hrs 03/01/19  Yes Satira Sark, MD     Allergies:     Allergies  Allergen Reactions  . Percocet [Oxycodone-Acetaminophen] Hives  . Noroxin [Norfloxacin] Nausea And Vomiting  . Celebrex [Celecoxib] Rash     Physical Exam:   Vitals  Blood pressure 93/68, pulse 76, temperature 98.1 F (36.7 C), temperature source Oral, resp. rate 20, height 5\' 7"  (1.702 m), weight 63 kg, SpO2 100 %.   1. General extremely frail, chronically ill appearing male, laying in bed in no apparent distress  2.  Patient is extremely hard of hearing, unable to have appropriate conversation with him, but he appears to be pleasant .  3. No F.N deficits, ALL C.Nerves Intact, Strength 5/5 all 4 extremities, Sensation intact all 4 extremities, Plantars down going.  4. Ears  and Eyes appear Normal, Conjunctivae clear, PERRLA. Moist Oral Mucosa.  5. Supple Neck,11 cm  JVD, No cervical lymphadenopathy appriciated, No Carotid Bruits.  6. Symmetrical Chest wall movement, Good air movement bilaterally, CTAB.  7. RRR, No Gallops, Rubs ,+ Murmurs, No Parasternal Heave.  8. Positive Bowel Sounds, Abdomen Soft, No tenderness, No organomegaly appriciated,No rebound -guarding or rigidity.  9.  No Cyanosis, Normal Skin Turgor, No Skin Rash or Bruise.+1 edema bilaterally  10. Good muscle tone,  joints appear normal , no effusions, Normal ROM.  11. No Palpable Lymph Nodes in Neck or Axillae     Data Review:    CBC Recent Labs  Lab 03/10/19 1521  WBC 4.5  HGB 7.1*  HCT 25.3*  PLT 159  MCV 68.9*  MCH 19.3*  MCHC 28.1*  RDW 23.9*  LYMPHSABS 0.8  MONOABS 0.8  EOSABS 0.1  BASOSABS 0.0   ------------------------------------------------------------------------------------------------------------------  Chemistries  Recent Labs  Lab 03/10/19 1521  NA 132*  K 4.8  CL 106  CO2 18*  GLUCOSE 102*  BUN 30*  CREATININE 1.36*  CALCIUM 8.7*  AST 22  ALT 10  ALKPHOS 142*  BILITOT 1.4*   ------------------------------------------------------------------------------------------------------------------ estimated creatinine clearance is 27.7 mL/min (A) (by C-G formula based on SCr of 1.36 mg/dL (H)). ------------------------------------------------------------------------------------------------------------------ No results for input(s): TSH, T4TOTAL, T3FREE, THYROIDAB in the last 72 hours.  Invalid input(s): FREET3  Coagulation profile No results for input(s): INR, PROTIME in the last 168 hours. ------------------------------------------------------------------------------------------------------------------- No results for input(s): DDIMER in the last 72 hours.  -------------------------------------------------------------------------------------------------------------------  Cardiac Enzymes No results for input(s): CKMB, TROPONINI, MYOGLOBIN in the last 168 hours.  Invalid input(s): CK ------------------------------------------------------------------------------------------------------------------    Component Value Date/Time   BNP 2,263.0 (H) 03/10/2019 1521     ---------------------------------------------------------------------------------------------------------------  Urinalysis    Component Value Date/Time   COLORURINE STRAW (A) 01/23/2018 1732   APPEARANCEUR CLEAR 01/23/2018 1732   LABSPEC 1.008 01/23/2018 1732   PHURINE 6.0 01/23/2018 1732   GLUCOSEU NEGATIVE 01/23/2018 1732   HGBUR NEGATIVE 01/23/2018 1732   BILIRUBINUR NEGATIVE 01/23/2018 1732   KETONESUR NEGATIVE 01/23/2018 1732   PROTEINUR NEGATIVE 01/23/2018 1732   NITRITE NEGATIVE 01/23/2018 1732   LEUKOCYTESUR TRACE (A) 01/23/2018 1732    ----------------------------------------------------------------------------------------------------------------   Imaging Results:    Dg Chest Portable 1 View  Result Date: 03/10/2019 CLINICAL DATA:  Cough, COVID-19 exposure EXAM: PORTABLE CHEST 1 VIEW COMPARISON:  06/02/2018 FINDINGS: Dual lead left-sided implanted cardiac device. Stable mild cardiomegaly. Calcific aortic knob. Diffuse prominence of the pulmonary interstitium bilaterally, increased from prior. No pleural effusion or pneumothorax. Severe degenerative changes of the right shoulder. IMPRESSION: Diffusely increased interstitial opacities throughout both lungs, which may reflect edema or atypical/viral infection. Electronically Signed   By: Davina Poke M.D.   On: 03/10/2019 15:48      Assessment & Plan:    Active Problems:   Aneurysm of abdominal vessel (HCC)   Mobitz type 2 second degree atrioventricular block   Dilated cardiomyopathy (HCC)   Pacemaker    Acute on chronic combined systolic and diastolic CHF (congestive heart failure) (HCC)   Shortness of breath    Acute on chronic systolic and diastolic CHF -Patient presents secondary to dyspnea, significant JVD, chest x-ray with evidence of volume overload, he has significantly elevated proBNP, and +1 lower extremity edema. -We will keep on Lasix 20 mg IV twice daily as blood pressure tolerates, holding parameters for systolic blood pressure more than 110. -Daily weight, strict ins and outs. -Pressure appears to be soft, he is not on any metoprolol, ACE or ARB. -COVID-19 test remains pending,  follow on results especially with exposure to COVID-19 before 2 weeks.  Acute respiratory failure with hypoxia  -due to pulmonary edema -weanedoxygen back to room air for saturation >92%  Second Degree AVB -Titus permanent pacemaker in 2017, follow on telemetry  AAA - followed by Vascular Surgery. At 5.6 cm by imaging in 06/2017.  Moderate Malnutrition -continue Ensure  Microcytic anemia -Hemoglobin of 7.1, is Hemoccult negative, no evidence of recurrent bleed, NSAID use for hemoglobin less than 7, will check anemia panel.   DVT Prophylaxis Heparin - SCDs   AM Labs Ordered, also please review Full Orders  Family Communication: Admission, patients condition and plan of care including tests being ordered have been discussed with the patient and nice who indicate understanding and agree with the plan and Code Status.  Code Status DNR/confirmed by niece as well  Likely DC to  Home  Condition GUARDED    Consults called:  None  Admission status: Observation  Time spent in minutes : 60 minutes   Phillips Climes M.D on 03/10/2019 at 9:07 PM  Between 7am to 7pm - Pager - [phone]. After 7pm go to [email or web address] - password [facility]  [facility] - Office  [phone]

## 2019-03-10 NOTE — ED Provider Notes (Signed)
[facility] EMERGENCY DEPARTMENT Provider Note   CSN: [id number] Arrival date & time: 03/10/19  1401     History   Chief Complaint Chief Complaint  Patient presents with  . Cough    HPI Shawn Frye is a 85 y.o. male.    Level 5 caveat due to difficulty hearing. HPI Patient presents reportedly with cough.  Reportedly has had a Covid exposure.  Has been coughing the last couple days with some production.  Very difficult to get history from patient due to difficulty hearing and the masks we are wearing.  Denies chest pain.  Most history comes through caregiver and nursing. Past Medical History:  Diagnosis Date  . AAA (abdominal aortic aneurysm) ([place])   . Arthritis   . Chronic combined systolic (congestive) and diastolic (congestive) heart failure ([place])    a. 10/2017: echo showing a reduced EF of 25%, diffuse HK with akinesis of the apical myocardium, Grade 2 DD, mild AI, mild to moderate MR, and mild to moderate TR  . GERD (gastroesophageal reflux disease)   . Heart block AV second degree    St. Jude pacemaker May 2017 - Dr. [name]  . Infection of bladder   . Joint pain   . Symptomatic bradycardia     Patient Active Problem List   Diagnosis Date Noted  . Trochanteric bursitis, right hip 01/06/2019  . Dysphagia   . Pleural effusion on right   . Pleural effusion 01/23/2018  . CHF (congestive heart failure) ([place]) 01/23/2018  . Malnutrition of moderate degree 01/14/2018  . Shortness of breath   . HCAP (healthcare-associated pneumonia) 01/12/2018  . Pneumonia 01/12/2018  . Chronic systolic CHF (congestive heart failure) ([place]) 01/12/2018  . Elevated troponin 01/12/2018  . Acute on chronic combined systolic and diastolic CHF (congestive heart failure) ([place]) 12/14/2017  . Acute respiratory failure with hypoxia ([place]) 12/13/2017  . Acute on chronic systolic CHF (congestive heart failure) ([place]) 12/13/2017  . Dilated cardiomyopathy ([place]) 12/13/2017  . Pacemaker 12/13/2017  .  Hyponatremia 12/13/2017  . Primary osteoarthritis, right shoulder 01/08/2017  . Mobitz type 2 second degree atrioventricular block 10/02/2015  . Bradycardia 08/29/2015  . Aneurysm of abdominal vessel ([place]) 03/21/2011    Past Surgical History:  Procedure Laterality Date  . BACK SURGERY    . BIOPSY  01/28/2018   Procedure: BIOPSY;  Surgeon: Rogene Houston, MD;  [facility];  Service: Endoscopy;;  pre-pyloric  . CATARACT EXTRACTION W/ INTRAOCULAR LENS  IMPLANT, BILATERAL Bilateral   . EP IMPLANTABLE DEVICE N/A 10/02/2015   Procedure: Pacemaker Implant;  Surgeon: Evans Lance, MD;  Location: [place] CV LAB;  Service: Cardiovascular;  Laterality: N/A;  . ESOPHAGOGASTRODUODENOSCOPY N/A 01/28/2018   Procedure: ESOPHAGOGASTRODUODENOSCOPY (EGD);  Surgeon: Rogene Houston, MD;  [facility];  Service: Endoscopy;  Laterality: N/A;  . EXCISIONAL HEMORRHOIDECTOMY    . HEMORROIDECTOMY    . INGUINAL HERNIA REPAIR Bilateral 1991  . INSERT / REPLACE / REMOVE PACEMAKER    . JOINT REPLACEMENT    . [place] SURGERY  2005  . TOTAL KNEE ARTHROPLASTY Bilateral 1998 -  2003  . TOTAL SHOULDER REPLACEMENT Left 1999   Partial  . TYMPANOSTOMY TUBE PLACEMENT Right         Home Medications    Prior to Admission medications   Medication Sig Start Date End Date Taking? Authorizing Provider  ALPRAZolam (XANAX) 0.25 MG tablet Take 0.125-0.25 mg by mouth daily as needed for anxiety.  [provider]  feeding supplement, ENSURE ENLIVE, (ENSURE ENLIVE) LIQD Take 237 mLs by mouth 2 (two) times daily between meals. 01/15/18   Barton Dubois, MD  loratadine (CLARITIN) 10 MG tablet Take 10 mg by mouth daily.    [provider]  pantoprazole (PROTONIX) 40 MG tablet Take 1 tablet (40 mg total) by mouth daily. 01/29/18 03/01/19  Kathie Dike, MD  potassium chloride SA (KLOR-CON) 20 MEQ tablet Take 20 mEq by mouth daily.    [provider]  spironolactone  (ALDACTONE) 25 MG tablet Take 25 mg by mouth 2 (two) times daily.    [provider]  torsemide (DEMADEX) 20 MG tablet Take 1 tablet (20 mg total) by mouth as needed. For wt gain over 3 lbs in 24 hrs 03/01/19   Satira Sark, MD    Family History Family History  Problem Relation Age of Onset  . Heart disease Father        NOT  before age 22  . Pneumonia Mother   . Cystic fibrosis Paternal Aunt   . Kidney disease Neg Hx     Social History Social History   Tobacco Use  . Smoking status: Former Smoker    Packs/day: 1.00    Years: 15.00    Pack years: 15.00    Types: Cigarettes    Quit date: 05/13/1950    Years since quitting: 68.8  . Smokeless tobacco: Never Used  Substance Use Topics  . Alcohol use: No    Alcohol/week: 0.0 standard drinks  . Drug use: No     Allergies   Percocet [oxycodone-acetaminophen], Noroxin [norfloxacin], and Celebrex [celecoxib]   Review of Systems Review of Systems  Unable to perform ROS: Mental status change  Respiratory: Positive for cough.      Physical Exam Updated Vital Signs BP 96/64   Pulse 78   Temp 98.1 F (36.7 C) (Oral)   Resp (!) 31   Ht 5\' 7"  (1.702 m)   Wt 63 kg   SpO2 100%   BMI 21.77 kg/m   Physical Exam Vitals signs and nursing note reviewed.  Eyes:     Pupils: Pupils are equal, round, and reactive to light.  Cardiovascular:     Rate and Rhythm: Regular rhythm.  Pulmonary:     Breath sounds: No wheezing, rhonchi or rales.  Abdominal:     Tenderness: There is no abdominal tenderness.  Musculoskeletal:     Comments: Mild edema bilateral lower extremities.  Skin:    Capillary Refill: Capillary refill takes less than 2 seconds.  Neurological:     Mental Status: He is alert.     Comments: Awake and pleasant but difficult to get history from.  Difficult to understand with mask also.      ED Treatments / Results  Labs (all labs ordered are listed, but only abnormal results are displayed) Labs  Reviewed  COMPREHENSIVE METABOLIC PANEL - Abnormal; Notable for the following components:      Result Value   Sodium 132 (*)    CO2 18 (*)    Glucose, Bld 102 (*)    BUN 30 (*)    Creatinine, Ser 1.36 (*)    Calcium 8.7 (*)    Albumin 3.3 (*)    Alkaline Phosphatase 142 (*)    Total Bilirubin 1.4 (*)    GFR calc non Af Amer 43 (*)    GFR calc Af Amer 50 (*)    All other components within normal limits  BRAIN NATRIURETIC PEPTIDE - Abnormal; Notable for the following components:   B Natriuretic Peptide 2,263.0 (*)    All other components within normal limits  CBC WITH DIFFERENTIAL/PLATELET - Abnormal; Notable for the following components:   RBC 3.67 (*)    Hemoglobin 7.1 (*)    HCT 25.3 (*)    MCV 68.9 (*)    MCH 19.3 (*)    MCHC 28.1 (*)    RDW 23.9 (*)    All other components within normal limits  SARS CORONAVIRUS 2 (TAT 6-24 HRS)  POC OCCULT BLOOD, ED  TROPONIN I (HIGH SENSITIVITY)  TROPONIN I (HIGH SENSITIVITY)    EKG EKG Interpretation  Date/Time:  Wednesday March 10 2019 15:11:50 EDT Ventricular Rate:  80 PR Interval:    QRS Duration: 179 QT Interval:  468 QTC Calculation: 540 R Axis:   -79 Text Interpretation: AV dual-paced rhythm Confirmed by Davonna Belling [id number]) on 03/10/2019 3:45:37 PM   Radiology Dg Chest Portable 1 View  Result Date: 03/10/2019 CLINICAL DATA:  Cough, COVID-19 exposure EXAM: PORTABLE CHEST 1 VIEW COMPARISON:  06/02/2018 FINDINGS: Dual lead left-sided implanted cardiac device. Stable mild cardiomegaly. Calcific aortic knob. Diffuse prominence of the pulmonary interstitium bilaterally, increased from prior. No pleural effusion or pneumothorax. Severe degenerative changes of the right shoulder. IMPRESSION: Diffusely increased interstitial opacities throughout both lungs, which may reflect edema or atypical/viral infection. Electronically Signed   By: Davina Poke M.D.   On: 03/10/2019 15:48    Procedures Procedures (including  critical care time)  Medications Ordered in ED Medications  potassium chloride 10 mEq in 100 mL IVPB (has no administration in time range)     Initial Impression / Assessment and Plan / ED Course  I have reviewed the triage vital signs and the nursing notes.  Pertinent labs & imaging results that were available during my care of the patient were reviewed by me and considered in my medical decision making (see chart for details).        Patient presents with shortness of breath and cough.  Has a reported Covid exposure.  Covid test pending.  However found to be anemic.  Hemoglobin of 7.  Guaiac negative.  BNP elevated and chest x-ray shows edema versus atypical infection.  With the anemia x-ray findings and Covid exposure feels that patient benefit for admission to the hospital.  Will discuss with hospitalist.  CRITICAL CARE Performed by: Davonna Belling Total critical care time30 minutes Critical care time was exclusive of separately billable procedures and treating other patients. Critical care was necessary to treat or prevent imminent or life-threatening deterioration. Critical care was time spent personally by me on the following activities: development of treatment plan with patient and/or surrogate as well as nursing, discussions with consultants, evaluation of patient's response to treatment, examination of patient, obtaining history from patient or surrogate, ordering and performing treatments and interventions, ordering and review of laboratory studies, ordering and review of radiographic studies, pulse oximetry and re-evaluation of patient's condition.   Shawn Frye was evaluated in Emergency Department on 03/10/2019 for the symptoms described in the history of present illness. He was evaluated in the context of the global COVID-19 pandemic, which necessitated consideration that the patient might be at risk for infection with the SARS-CoV-2 virus that causes COVID-19.  Institutional protocols and algorithms that pertain to the evaluation of patients at risk for COVID-19 are in a state of rapid change based on information released by regulatory bodies including the CDC and  federal and state organizations. These policies and algorithms were followed during the patient's care in the ED.   Final Clinical Impressions(s) / ED Diagnoses   Final diagnoses:  Anemia, unspecified type  Congestive heart failure, unspecified HF chronicity, unspecified heart failure type [facility])    ED Discharge Orders    None       Davonna Belling, MD 03/10/19 1800

## 2019-03-10 NOTE — ED Notes (Signed)
Patient sleeping at this time. Patient partial plate placed in denture container and hearing aids placed in cup.

## 2019-03-10 NOTE — ED Notes (Signed)
Update given to POA

## 2019-03-10 NOTE — ED Triage Notes (Signed)
Patient has been coughing more than normal for the past week. Has had a positive COVID exposure. No other symptoms.

## 2019-03-11 DIAGNOSIS — M2550 Pain in unspecified joint: Secondary | ICD-10-CM | POA: Diagnosis not present

## 2019-03-11 DIAGNOSIS — Z8249 Family history of ischemic heart disease and other diseases of the circulatory system: Secondary | ICD-10-CM | POA: Diagnosis not present

## 2019-03-11 DIAGNOSIS — Z7401 Bed confinement status: Secondary | ICD-10-CM | POA: Diagnosis not present

## 2019-03-11 DIAGNOSIS — M19011 Primary osteoarthritis, right shoulder: Secondary | ICD-10-CM | POA: Diagnosis not present

## 2019-03-11 DIAGNOSIS — I714 Abdominal aortic aneurysm, without rupture: Secondary | ICD-10-CM

## 2019-03-11 DIAGNOSIS — Z96653 Presence of artificial knee joint, bilateral: Secondary | ICD-10-CM | POA: Diagnosis present

## 2019-03-11 DIAGNOSIS — R531 Weakness: Secondary | ICD-10-CM | POA: Diagnosis not present

## 2019-03-11 DIAGNOSIS — Z881 Allergy status to other antibiotic agents status: Secondary | ICD-10-CM | POA: Diagnosis not present

## 2019-03-11 DIAGNOSIS — E440 Moderate protein-calorie malnutrition: Secondary | ICD-10-CM | POA: Diagnosis present

## 2019-03-11 DIAGNOSIS — D638 Anemia in other chronic diseases classified elsewhere: Secondary | ICD-10-CM | POA: Diagnosis present

## 2019-03-11 DIAGNOSIS — K219 Gastro-esophageal reflux disease without esophagitis: Secondary | ICD-10-CM | POA: Diagnosis not present

## 2019-03-11 DIAGNOSIS — J449 Chronic obstructive pulmonary disease, unspecified: Secondary | ICD-10-CM | POA: Diagnosis not present

## 2019-03-11 DIAGNOSIS — G4700 Insomnia, unspecified: Secondary | ICD-10-CM | POA: Diagnosis not present

## 2019-03-11 DIAGNOSIS — I499 Cardiac arrhythmia, unspecified: Secondary | ICD-10-CM | POA: Diagnosis not present

## 2019-03-11 DIAGNOSIS — I420 Dilated cardiomyopathy: Secondary | ICD-10-CM | POA: Diagnosis not present

## 2019-03-11 DIAGNOSIS — R4182 Altered mental status, unspecified: Secondary | ICD-10-CM | POA: Diagnosis not present

## 2019-03-11 DIAGNOSIS — R0602 Shortness of breath: Secondary | ICD-10-CM | POA: Diagnosis not present

## 2019-03-11 DIAGNOSIS — I5043 Acute on chronic combined systolic (congestive) and diastolic (congestive) heart failure: Secondary | ICD-10-CM | POA: Diagnosis not present

## 2019-03-11 DIAGNOSIS — Z66 Do not resuscitate: Secondary | ICD-10-CM | POA: Diagnosis present

## 2019-03-11 DIAGNOSIS — D649 Anemia, unspecified: Secondary | ICD-10-CM | POA: Diagnosis not present

## 2019-03-11 DIAGNOSIS — Z515 Encounter for palliative care: Secondary | ICD-10-CM | POA: Diagnosis present

## 2019-03-11 DIAGNOSIS — R5381 Other malaise: Secondary | ICD-10-CM

## 2019-03-11 DIAGNOSIS — I5023 Acute on chronic systolic (congestive) heart failure: Secondary | ICD-10-CM | POA: Diagnosis not present

## 2019-03-11 DIAGNOSIS — K5900 Constipation, unspecified: Secondary | ICD-10-CM | POA: Diagnosis not present

## 2019-03-11 DIAGNOSIS — Z950 Presence of cardiac pacemaker: Secondary | ICD-10-CM | POA: Diagnosis not present

## 2019-03-11 DIAGNOSIS — Z885 Allergy status to narcotic agent status: Secondary | ICD-10-CM | POA: Diagnosis not present

## 2019-03-11 DIAGNOSIS — Z79899 Other long term (current) drug therapy: Secondary | ICD-10-CM | POA: Diagnosis not present

## 2019-03-11 DIAGNOSIS — R110 Nausea: Secondary | ICD-10-CM | POA: Diagnosis not present

## 2019-03-11 DIAGNOSIS — U071 COVID-19: Secondary | ICD-10-CM | POA: Diagnosis present

## 2019-03-11 DIAGNOSIS — Z96612 Presence of left artificial shoulder joint: Secondary | ICD-10-CM | POA: Diagnosis present

## 2019-03-11 DIAGNOSIS — Z87891 Personal history of nicotine dependence: Secondary | ICD-10-CM | POA: Diagnosis not present

## 2019-03-11 DIAGNOSIS — Z886 Allergy status to analgesic agent status: Secondary | ICD-10-CM | POA: Diagnosis not present

## 2019-03-11 DIAGNOSIS — Z6821 Body mass index (BMI) 21.0-21.9, adult: Secondary | ICD-10-CM | POA: Diagnosis not present

## 2019-03-11 DIAGNOSIS — J1289 Other viral pneumonia: Secondary | ICD-10-CM | POA: Diagnosis present

## 2019-03-11 DIAGNOSIS — J9601 Acute respiratory failure with hypoxia: Secondary | ICD-10-CM | POA: Diagnosis present

## 2019-03-11 DIAGNOSIS — H9190 Unspecified hearing loss, unspecified ear: Secondary | ICD-10-CM | POA: Diagnosis present

## 2019-03-11 DIAGNOSIS — N400 Enlarged prostate without lower urinary tract symptoms: Secondary | ICD-10-CM | POA: Diagnosis not present

## 2019-03-11 DIAGNOSIS — R54 Age-related physical debility: Secondary | ICD-10-CM | POA: Diagnosis present

## 2019-03-11 DIAGNOSIS — I509 Heart failure, unspecified: Secondary | ICD-10-CM | POA: Diagnosis not present

## 2019-03-11 DIAGNOSIS — I441 Atrioventricular block, second degree: Secondary | ICD-10-CM | POA: Diagnosis not present

## 2019-03-11 LAB — BASIC METABOLIC PANEL
Anion gap: 9 (ref 5–15)
BUN: 29 mg/dL — ABNORMAL HIGH (ref 8–23)
CO2: 17 mmol/L — ABNORMAL LOW (ref 22–32)
Calcium: 8.5 mg/dL — ABNORMAL LOW (ref 8.9–10.3)
Chloride: 110 mmol/L (ref 98–111)
Creatinine, Ser: 1.24 mg/dL (ref 0.61–1.24)
GFR calc Af Amer: 56 mL/min — ABNORMAL LOW (ref >60)
GFR calc non Af Amer: 48 mL/min — ABNORMAL LOW (ref >60)
Glucose, Bld: 99 mg/dL (ref 70–99)
Potassium: 5 mmol/L (ref 3.5–5.1)
Sodium: 136 mmol/L (ref 135–145)

## 2019-03-11 LAB — IRON AND TIBC
Iron: 15 ug/dL — ABNORMAL LOW (ref 45–182)
Saturation Ratios: 3 % — ABNORMAL LOW (ref 17.9–39.5)
TIBC: 534 ug/dL — ABNORMAL HIGH (ref 250–450)
UIBC: 519 ug/dL

## 2019-03-11 LAB — SARS CORONAVIRUS 2 (TAT 6-24 HRS): SARS Coronavirus 2: POSITIVE — AB

## 2019-03-11 LAB — PROCALCITONIN: Procalcitonin: <0.1 ng/mL

## 2019-03-11 LAB — CBC
HCT: 22.9 % — ABNORMAL LOW (ref 39.0–52.0)
Hemoglobin: 6.5 g/dL — CL (ref 13.0–17.0)
MCH: 19.5 pg — ABNORMAL LOW (ref 26.0–34.0)
MCHC: 28.4 g/dL — ABNORMAL LOW (ref 30.0–36.0)
MCV: 68.8 fL — ABNORMAL LOW (ref 80.0–100.0)
Platelets: 150 10*3/uL (ref 150–400)
RBC: 3.33 MIL/uL — ABNORMAL LOW (ref 4.22–5.81)
RDW: 23.9 % — ABNORMAL HIGH (ref 11.5–15.5)
WBC: 5 10*3/uL (ref 4.0–10.5)
nRBC: 0 % (ref 0.0–0.2)

## 2019-03-11 LAB — RETICULOCYTES
Immature Retic Fract: 30.9 % — ABNORMAL HIGH (ref 2.3–15.9)
RBC.: 3.33 MIL/uL — ABNORMAL LOW (ref 4.22–5.81)
Retic Count, Absolute: 40.6 10*3/uL (ref 19.0–186.0)
Retic Ct Pct: 1.2 % (ref 0.4–3.1)

## 2019-03-11 LAB — VITAMIN B12: Vitamin B-12: 687 pg/mL (ref 180–914)

## 2019-03-11 LAB — HEMOGLOBIN AND HEMATOCRIT, BLOOD
HCT: 26.6 % — ABNORMAL LOW (ref 39.0–52.0)
Hemoglobin: 7.7 g/dL — ABNORMAL LOW (ref 13.0–17.0)

## 2019-03-11 LAB — FOLATE: Folate: 20.3 ng/mL (ref >5.9)

## 2019-03-11 LAB — ABO/RH: ABO/RH(D): A POS

## 2019-03-11 LAB — FERRITIN: Ferritin: 13 ng/mL — ABNORMAL LOW (ref 24–336)

## 2019-03-11 LAB — PREPARE RBC (CROSSMATCH)

## 2019-03-11 MED ORDER — DIPHENHYDRAMINE HCL 25 MG PO CAPS
25.0000 mg | ORAL_CAPSULE | Freq: Once | ORAL | Status: AC
  Administered 2019-03-11: 25 mg via ORAL
  Filled 2019-03-11: qty 1

## 2019-03-11 MED ORDER — DEXAMETHASONE SODIUM PHOSPHATE 10 MG/ML IJ SOLN
6.0000 mg | Freq: Every day | INTRAMUSCULAR | Status: DC
  Administered 2019-03-11 – 2019-03-16 (×7): 6 mg via INTRAVENOUS
  Filled 2019-03-11 (×7): qty 1

## 2019-03-11 MED ORDER — ACETAMINOPHEN 325 MG PO TABS
650.0000 mg | ORAL_TABLET | Freq: Once | ORAL | Status: AC
  Administered 2019-03-11: 650 mg via ORAL
  Filled 2019-03-11: qty 2

## 2019-03-11 MED ORDER — SODIUM CHLORIDE 0.9% IV SOLUTION
Freq: Once | INTRAVENOUS | Status: AC
  Administered 2019-03-11: 09:00:00 via INTRAVENOUS

## 2019-03-11 NOTE — Progress Notes (Signed)
PROGRESS NOTE    Shawn Frye  [id number] DOB: [birth year] DOA: 03/10/2019 PCP: [name], MD     Brief Narrative:  85 year old male with medical history significant for cardiomyopathy with systolic congestive heart failure (last ejection fraction 20%), cardiac pacemaker with history of second-degree AV block, AAA, iron deficiency anemia and GERD; who presented to the hospital secondary to increased shortness of breath and coughing.  Patient found with significant elevated BNP, increased lower extremity swelling and chest x-ray demonstrating bilateral vascular congestion and diffuse increased interstitial opacities.  Patient has 24/7 care at home by caregivers, was exposed to positive COVID contact approx 2 weeks ago. Symptoms has been present and slightly worsening in the last 4 days.    Assessment & Plan: 1-acute on chronic systolic heart failure -Continue daily weights and strict I's and O's -Continue low-sodium diet -Continue the use of spironolactone and low-dose IV Lasix -Follow renal function, electrolytes and volume status.  2-positive Covid infection -given interstitial findings on chest x-ray patient has been started on IV Decadron and will be transferred to [facility] for initiation of remdesivir and further management. -Still complaining of feeling short of breath -No requiring oxygen supplementation currently. -Follow daily inflammatory markers as per protocol. -patient is afebrile.  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 -Denies abdominal pain.  4-anemia of chronic disease: Iron deficiency nature -No signs of acute bleeding currently appreciated -Hemoglobin has dropped below 7; will transfuse 1 unit of PRBCs and follow trend.  5-GERD -continue PPI  6-moderate protein malnutrition -Continue the use of Ensure -Patient encouraged to improve oral  intake.  7-physical deconditioning and weakness -Multifactorial -Arrange for physical therapy evaluation once medically stable -As mentioned above will provide PRBC transfusion, continue treatment for CHF and for COVID infection.   DVT prophylaxis: SCD's Code Status: DNR/DNI (ok to use BIPAP if required) Family Communication: No family at bedside. Disposition Plan: Patient will be transferred to [facility] for further care and management of acute on chronic systolic heart failure exacerbation and positive coronavirus.  Continue daily weights, strict I's and O's, low-sodium diet, IV Lasix, Decadron and remdesivir when at [facility].  Consultants:   None  Procedures:   See below for x-ray reports.  Antimicrobials:  Anti-infectives (From admission, onward)   None       Subjective: Hard of hearing, no fever, no chest pain, no nausea, no vomiting.  Still feeling short of breath and experiencing intermittent coughing spells.  Still with mild signs of fluid overload on physical examination.  Patient reports feeling weak and tired.  Objective: Vitals:   03/11/19 0619 03/11/19 0630 03/11/19 0700 03/11/19 0730  BP:  105/72 100/74 105/73  Pulse: 86 86 87 85  Resp: (!) 22 (!) 27 (!) 22 18  Temp:      TempSrc:      SpO2: 99% 99% 100% 97%  Weight:      Height:        Intake/Output Summary (Last 24 hours) at 03/11/2019 0809 Last data filed at 03/11/2019 0030 Gross per 24 hour  Intake 90.01 ml  Output 300 ml  Net -209.99 ml   Filed Weights   03/10/19 1436  Weight: 63 kg    Examination: General exam: Alert, awake, afebrile and in no major distress. Still feeling SOB and having intermittent coughing spells.  Respiratory system: no using accessory muscles, decrease BS at the bases, positive rhonchi.  Cardiovascular  system:Rate controlled. No rubs, no gallops and no JVD on exam.. Gastrointestinal system: Abdomen is nondistended, soft and nontender. No organomegaly or masses  felt. Normal bowel sounds heard. Central nervous system: Alert and oriented. No focal neurological deficits. Extremities: No Cyanosis, no clubbing, trace to 1 plus edema bilaterally.  Skin: No rashes, no petechiae. Psychiatry: Mood & affect appropriate.     Data Reviewed: I have personally reviewed following labs and imaging studies  CBC: Recent Labs  Lab 03/10/19 1521 03/11/19 0553  WBC 4.5 5.0  NEUTROABS 2.7  --   HGB 7.1* 6.5*  HCT 25.3* 22.9*  MCV 68.9* 68.8*  PLT 159 [id number]   Basic Metabolic Panel: Recent Labs  Lab 03/10/19 1521 03/11/19 0553  NA 132* 136  K 4.8 5.0  CL 106 110  CO2 18* 17*  GLUCOSE 102* 99  BUN 30* 29*  CREATININE 1.36* 1.24  CALCIUM 8.7* 8.5*   GFR: Estimated Creatinine Clearance: 30.4 mL/min (by C-G formula based on SCr of 1.24 mg/dL).   Liver Function Tests: Recent Labs  Lab 03/10/19 1521  AST 22  ALT 10  ALKPHOS 142*  BILITOT 1.4*  PROT 7.8  ALBUMIN 3.3*   Anemia Panel: Recent Labs    03/11/19 0553  VITAMINB12 687  FOLATE 20.3  FERRITIN 13*  TIBC 534*  IRON 15*  RETICCTPCT 1.2   Urine analysis:    Component Value Date/Time   COLORURINE STRAW (A) 01/23/2018 1732   APPEARANCEUR CLEAR 01/23/2018 1732   LABSPEC 1.008 01/23/2018 1732   PHURINE 6.0 01/23/2018 1732   GLUCOSEU NEGATIVE 01/23/2018 1732   HGBUR NEGATIVE 01/23/2018 1732   BILIRUBINUR NEGATIVE 01/23/2018 1732   KETONESUR NEGATIVE 01/23/2018 1732   PROTEINUR NEGATIVE 01/23/2018 1732   NITRITE NEGATIVE 01/23/2018 1732   LEUKOCYTESUR TRACE (A) 01/23/2018 1732    Recent Results (from the past 240 hour(s))  SARS CORONAVIRUS 2 (TAT 6-24 HRS) Nasopharyngeal Nasopharyngeal Swab     Status: Abnormal   Collection Time: 03/10/19  3:21 PM   Specimen: Nasopharyngeal Swab  Result Value Ref Range Status   SARS Coronavirus 2 POSITIVE (A) NEGATIVE Final    Comment: RESULT CALLED TO, READ BACK BY AND VERIFIED WITH: J HARDEN,RN 0104 03/11/2019 D BRADLEY (NOTE) SARS-CoV-2  target nucleic acids are DETECTED. The SARS-CoV-2 RNA is generally detectable in upper and lower respiratory specimens during the acute phase of infection. Positive results are indicative of active infection with SARS-CoV-2. Clinical  correlation with patient history and other diagnostic information is necessary to determine patient infection status. Positive results do  not rule out bacterial infection or co-infection with other viruses. The expected result is Negative. Fact Sheet for Patients: SugarRoll.be Fact Sheet for Healthcare Providers: https://www.woods-mathews.com/ This test is not yet approved or cleared by the [place] FDA and  has been authorized for detection and/or diagnosis of SARS-CoV-2 by FDA under an Emergency Use Authorization (EUA). This EUA will remain  in effect (meaning this test can be used) for  the duration of the COVID-19 declaration under Section 564(b)(1) of the Act, 21 U.S.C. section 360bbb-3(b)(1), unless the authorization is terminated or revoked sooner. Performed at [facility], [place] [address], [place], [place] [address]       Radiology Studies: Dg Chest Portable 1 View  Result Date: 03/10/2019 CLINICAL DATA:  Cough, COVID-19 exposure EXAM: PORTABLE CHEST 1 VIEW COMPARISON:  06/02/2018 FINDINGS: Dual lead left-sided implanted cardiac device. Stable mild cardiomegaly. Calcific aortic knob. Diffuse prominence of the pulmonary interstitium bilaterally,  increased from prior. No pleural effusion or pneumothorax. Severe degenerative changes of the right shoulder. IMPRESSION: Diffusely increased interstitial opacities throughout both lungs, which may reflect edema or atypical/viral infection. Electronically Signed   By: Davina Poke M.D.   On: 03/10/2019 15:48    Scheduled Meds: . sodium chloride   Intravenous Once  . acetaminophen  650 mg Oral Once  . dexamethasone (DECADRON) injection  6 mg  Intravenous QHS  . diphenhydrAMINE  25 mg Oral Once  . feeding supplement (ENSURE ENLIVE)  237 mL Oral BID BM  . furosemide  20 mg Intravenous BID  . pantoprazole  40 mg Oral Daily  . sodium chloride flush  3 mL Intravenous Q12H  . spironolactone  25 mg Oral BID   Continuous Infusions: . sodium chloride       LOS: 0 days    Time spent: 30 minutes.    Barton Dubois, MD [facility] Pager [phone]   03/11/2019, 8:09 AM

## 2019-03-11 NOTE — ED Notes (Signed)
Checked with  Bed Placement about status of bed. "They are in the process of trying to make beds That will meet the needs of our Pt along with a couple others".  Nurse informed.

## 2019-03-11 NOTE — ED Notes (Signed)
Date and time results received: 03/11/19  01:07 Test: COVID Critical Value: Positive   Name of Provider Notified: Dr. Maudie Mercury  Physician notified

## 2019-03-11 NOTE — ED Notes (Signed)
Patient repositioned in bed and given warm blankets.

## 2019-03-11 NOTE — Progress Notes (Signed)
Xcover Covid-19 +  A/P Covid-19 infection Change bed request to [facility] Pt started on dexamethasone 6mg  iv qday Consider remdesivir in AM   I notified his Niece that he was Covid -54 positive and would be admitted to [facility] in case needs cardiology

## 2019-03-11 NOTE — ED Notes (Signed)
Date and time results received: 03/11/19 06:49 Test: hemoglobin Critical Value: 6.5  Name of Provider Notified: Dr. Maudie Mercury  Orders Received? Or Actions Taken?: Dr. Maudie Mercury notified

## 2019-03-12 DIAGNOSIS — I509 Heart failure, unspecified: Secondary | ICD-10-CM

## 2019-03-12 DIAGNOSIS — I420 Dilated cardiomyopathy: Secondary | ICD-10-CM

## 2019-03-12 DIAGNOSIS — I5023 Acute on chronic systolic (congestive) heart failure: Secondary | ICD-10-CM

## 2019-03-12 LAB — TYPE AND SCREEN
ABO/RH(D): A POS
ABO/RH(D): A POS
Antibody Screen: NEGATIVE
Antibody Screen: NEGATIVE
Unit division: 0

## 2019-03-12 LAB — BPAM RBC
Blood Product Expiration Date: 202011082359
ISSUE DATE / TIME: 202010291026
Unit Type and Rh: 600

## 2019-03-12 LAB — ABO/RH: ABO/RH(D): A POS

## 2019-03-12 MED ORDER — ADULT MULTIVITAMIN W/MINERALS CH
1.0000 | ORAL_TABLET | Freq: Every day | ORAL | Status: DC
  Administered 2019-03-12 – 2019-03-16 (×5): 1 via ORAL
  Filled 2019-03-12 (×5): qty 1

## 2019-03-12 MED ORDER — ENSURE ENLIVE PO LIQD
237.0000 mL | Freq: Three times a day (TID) | ORAL | Status: DC
  Administered 2019-03-12 – 2019-03-16 (×11): 237 mL via ORAL

## 2019-03-12 MED ORDER — FERROUS GLUCONATE 324 (38 FE) MG PO TABS
324.0000 mg | ORAL_TABLET | Freq: Three times a day (TID) | ORAL | Status: DC
  Administered 2019-03-12 – 2019-03-16 (×16): 324 mg via ORAL
  Filled 2019-03-12 (×18): qty 1

## 2019-03-12 MED ORDER — SODIUM CHLORIDE 0.9 % IV SOLN
100.0000 mg | INTRAVENOUS | Status: AC
  Administered 2019-03-13 – 2019-03-16 (×4): 100 mg via INTRAVENOUS
  Filled 2019-03-12 (×4): qty 20

## 2019-03-12 MED ORDER — SODIUM CHLORIDE 0.9 % IV SOLN
200.0000 mg | Freq: Once | INTRAVENOUS | Status: AC
  Administered 2019-03-12: 200 mg via INTRAVENOUS
  Filled 2019-03-12: qty 40

## 2019-03-12 NOTE — Evaluation (Addendum)
Physical Therapy Evaluation Patient Details Name: Shawn Frye MRN: [id number] DOB: 04-28-1919 Today's Date: 03/12/2019   History of Present Illness  85 year old male with medical history significant for cardiomyopathy with systolic congestive heart failure (last ejection fraction 20%), cardiac pacemaker with history of second-degree AV block, AAA, iron deficiency anemia and GERD; who presented to the hospital secondary to increased shortness of breath and coughing.  Patient found with significant elevated BNP, increased lower extremity swelling and chest x-ray demonstrating bilateral vascular congestion and diffuse increased interstitial opacities.  Patient has 24/7 care at home by caregivers, was exposed to positive COVID contact approx 2 weeks ago. Symptoms has been present and slightly worsening in the last 4 days.  Clinical Impression  The patient is very sweet, reports caregivers, has assistance for ADL's and ambulation recently since illness. Generally was ambulatory  With a cane, now on rollator. Recommend HHPT if able to return home with 24/7 caregivers.Pt admitted with above diagnosis.  Pt currently with functional limitations due to the deficits listed below (see PT Problem List). Pt will benefit from skilled PT to increase their independence and safety with mobility to allow discharge to the venue listed below.       Follow Up Recommendations Supervision/Assistance - 24 hour(had 24/7 PTA)    Equipment Recommendations  None recommended by PT    Recommendations for Other Services       Precautions / Restrictions Precautions Precautions: Fall Precaution Comments: watch the mattress, it was causing patient to slide off. Restrictions Weight Bearing Restrictions: No   BP's are soft. On RA 95%     Mobility  Bed Mobility Overal bed mobility: Needs Assistance Bed Mobility: Sidelying to Sit   Sidelying to sit: HOB elevated;Max assist       General bed mobility comments:  patient had slid way down and to the right of the bed to the very edge, thus requiring max assist to prevent further sliding forward. attempted to slide the patient back onto bed but mattress seemed to be preventing. Placed table in front for stabiliation while recliner set up.  Transfers Overall transfer level: Needs assistance Equipment used: Rolling walker (2 wheeled) Transfers: Sit to/from Stand Sit to Stand: Mod assist         General transfer comment: steady assist to stand form bed, small shuffle steps to recliner, moves sowly, patient did reach back for recliner and lowered to chair without cues.  Ambulation/Gait                Stairs            Wheelchair Mobility    Modified Rankin (Stroke Patients Only)       Balance Overall balance assessment: Needs assistance Sitting-balance support: Bilateral upper extremity supported;Feet supported Sitting balance-Leahy Scale: Fair Sitting balance - Comments: posterir bias   Standing balance support: Bilateral upper extremity supported;During functional activity Standing balance-Leahy Scale: Poor Standing balance comment: reliant on  RW                             Pertinent Vitals/Pain Pain Assessment: No/denies pain    Home Living Family/patient expects to be discharged to:: Private residence Living Arrangements: Non-relatives/Friends Available Help at Discharge: Personal care attendant;Available 24 hours/day Type of Home: House Home Access: Level entry     Home Layout: One level Home Equipment: Walker - 4 wheels;Bedside commode;Transport chair Additional Comments: wife died in 11-10-2018, has no children,has only the  caregivers    Prior Function Level of Independence: Needs assistance   Gait / Transfers Assistance Needed: was on a cane, uses rollator since illness  ADL's / Homemaking Assistance Needed: caregivers 24/7- has 4 different ones        Hand Dominance         Extremity/Trunk Assessment   Upper Extremity Assessment Upper Extremity Assessment: Generalized weakness    Lower Extremity Assessment Lower Extremity Assessment: Generalized weakness    Cervical / Trunk Assessment Cervical / Trunk Assessment: Kyphotic  Communication   Communication: HOH  Cognition Arousal/Alertness: Awake/alert Behavior During Therapy: WFL for tasks assessed/performed Overall Cognitive Status: No family/caregiver present to determine baseline cognitive functioning                                 General Comments: oriented to GV, stated, I don't know why I am here>      General Comments      Exercises     Assessment/Plan    PT Assessment Patient needs continued PT services  PT Problem List Decreased strength;Decreased mobility;Decreased safety awareness;Decreased coordination;Decreased knowledge of precautions;Decreased activity tolerance;Decreased knowledge of use of DME;Decreased balance;Cardiopulmonary status limiting activity       PT Treatment Interventions DME instruction;Therapeutic activities;Gait training;Functional mobility training    PT Goals (Current goals can be found in the Care Plan section)  Acute Rehab PT Goals Patient Stated Goal: to go back home PT Goal Formulation: With patient Time For Goal Achievement: 03/26/19 Potential to Achieve Goals: Good    Frequency Min 3X/week   Barriers to discharge   if he has 24/7 caregivers    Co-evaluation               AM-PAC PT "6 Clicks" Mobility  Outcome Measure Help needed turning from your back to your side while in a flat bed without using bedrails?: A Lot Help needed moving from lying on your back to sitting on the side of a flat bed without using bedrails?: A Lot Help needed moving to and from a bed to a chair (including a wheelchair)?: A Lot Help needed standing up from a chair using your arms (e.g., wheelchair or bedside chair)?: A Lot Help needed to walk in  hospital room?: Total Help needed climbing 3-5 steps with a railing? : Total 6 Click Score: 10    End of Session Equipment Utilized During Treatment: Gait belt Activity Tolerance: Patient tolerated treatment well Patient left: in chair;with call bell/phone within reach Nurse Communication: Mobility status PT Visit Diagnosis: Difficulty in walking, not elsewhere classified (R26.2)    Time: 1500-1540 PT Time Calculation (min) (ACUTE ONLY): 40 min   Charges:   PT Evaluation $PT Eval Moderate Complexity: 1 Mod PT Treatments $Therapeutic Activity: 23-37 mins        [place]  Office [phone]   Claretha Cooper 03/12/2019, 5:48 PM

## 2019-03-12 NOTE — Progress Notes (Signed)
Initial Nutrition Assessment RD working remotely.  DOCUMENTATION CODES:   Underweight  INTERVENTION:    Ensure Enlive po TID, each supplement provides 350 kcal and 20 grams of protein  MVI with minerals daily  Pt receiving Hormel Shake daily with Breakfast which provides 520 kcals and 22 g of protein and Magic cup BID with lunch and dinner, each supplement provides 290 kcal and 9 grams of protein, automatically on meal trays to optimize nutritional intake.   NUTRITION DIAGNOSIS:   Increased nutrient needs related to acute illness as evidenced by estimated needs.  GOAL:   Patient will meet greater than or equal to 90% of their needs  MONITOR:   PO intake, Supplement acceptance, Labs, Skin  REASON FOR ASSESSMENT:   Malnutrition Screening Tool    ASSESSMENT:   85 yo male admitted with SOB r/t COVID-19. PMH includes cardiomyopathy, CHF, pacemaker, AAA, anemia.   Patient is consuming 25% of meals.  Suspect intake has been suboptimal for the past few weeks, given 28% weight loss within the past 1.5 weeks.  Labs reviewed. Meds: MVI, Aldactone, Lasix, Fergon, Decadron Suspect severe PCM.  NUTRITION - FOCUSED PHYSICAL EXAM:  unable to complete  Diet Order:   Diet Order            Diet Heart Room service appropriate? Yes; Fluid consistency: Thin  Diet effective now              EDUCATION NEEDS:   Not appropriate for education at this time  Skin:  Skin Assessment: Reviewed RN Assessment  Last BM:  10/28  Height:   Ht Readings from Last 1 Encounters:  03/12/19 5\' 7"  (1.702 m)    Weight:   Wt Readings from Last 1 Encounters:  03/12/19 44.8 kg    Ideal Body Weight:  67.3 kg  BMI:  Body mass index is 15.47 kg/m.  Estimated Nutritional Needs:   Kcal:  1700-1900  Protein:  80-90 gm  Fluid:  >/= 1.7 L    Molli Barrows, RD, LDN, [place] Pager [phone] After Hours Pager [phone]

## 2019-03-12 NOTE — Progress Notes (Signed)
PROGRESS NOTE  Shawn Frye [id number] DOB: 1919-03-23 DOA: 03/10/2019 PCP: [name], MD   LOS: 1 day   Brief Narrative / Interim history: 85 year old male with medical history significant for cardiomyopathy with systolic congestive heart failure (last ejection fraction 20%), cardiac pacemaker with history of second-degree AV block, AAA, iron deficiency anemia and GERD; who presented to the hospital secondary to increased shortness of breath and coughing.  Patient found with significant elevated BNP, increased lower extremity swelling and chest x-ray demonstrating bilateral vascular congestion and diffuse increased interstitial opacities.  Patient has 24/7 care at home by caregivers, was exposed to positive COVID contact approx 2 weeks ago. Symptoms has been present and slightly worsening in the last 4 days.   Subjective / 24h Interval events: He is doing well overall, denies any shortness of breath, denies any chest pain, no abdominal pain, no nausea or vomiting.  Very hard of hearing  Assessment & Plan: Active Problems:   Aneurysm of abdominal vessel (HCC)   Mobitz type 2 second degree atrioventricular block   Acute on chronic systolic CHF (congestive heart failure) (HCC)   Dilated cardiomyopathy (HCC)   Pacemaker   Acute on chronic combined systolic and diastolic CHF (congestive heart failure) (HCC)   Shortness of breath   COVID-19 virus infection   Principal Problem Covid-19 Viral Illness / Pneumonia -Patient admitted to the hospital with increased shortness of breath and coughing, he was tested positive for SARS-CoV-2 and chest x-ray showed pneumonia -Started on Decadron, continue, also started on remdesivir given infiltrates on the chest x-ray -Continue to closely monitor inflammatory markers, minimally elevated yesterday  COVID-19 Labs  Recent Labs    03/10/19 1715 03/11/19 0553  FERRITIN  --  13*  CRP 1.4*  --     Lab Results  Component Value Date   SARSCOV2NAA POSITIVE (A) 03/10/2019   [place] Not Detected 11/26/2018   Active Problems Acute on chronic systolic CHF -Continue Lasix, daily weights, strict I's and O's, his BNP was elevated and there is a degree of fluid overload.  Continue to monitor renal function  AAA -Outpatient follow-up  Anemia of chronic disease -No bleeding, significant iron deficiency noted, started on iron supplementation -Transfuse 1 unit of packed red blood cells, hemoglobin stable afterwards  GERD -Continue PPI  Moderate protein malnutrition -Continue Ensure  Deconditioning/weakness -PT  Scheduled Meds: . dexamethasone (DECADRON) injection  6 mg Intravenous QHS  . feeding supplement (ENSURE ENLIVE)  237 mL Oral BID BM  . ferrous gluconate  324 mg Oral TID WC  . furosemide  20 mg Intravenous BID  . pantoprazole  40 mg Oral Daily  . sodium chloride flush  3 mL Intravenous Q12H  . spironolactone  25 mg Oral BID   Continuous Infusions: . sodium chloride    . [START ON 03/13/2019] remdesivir 100 mg in NS 250 mL     PRN Meds:.sodium chloride, acetaminophen, ALPRAZolam, ondansetron (ZOFRAN) IV, sodium chloride flush  DVT prophylaxis: SCDs Code Status: DNR Family Communication: d/w niece Ginger Stickland [phone] Disposition Plan: home when ready   Consultants:  None   Procedures:  None   Microbiology: None   Antimicrobials: None    Objective: Vitals:   03/12/19 0015 03/12/19 0030 03/12/19 0230 03/12/19 0926  BP:  (!) 89/59 92/62 (!) 89/64  Pulse: 85 88 86 90  Resp: 20 (!) 21 20 20   Temp:   97.8 F (36.6 C) 98.1 F (36.7 C)  TempSrc:   Oral Oral  SpO2:  95% 96% 97% 96%  Weight:   44.8 kg   Height:   5\' 7"  (1.702 m)     Intake/Output Summary (Last 24 hours) at 03/12/2019 1405 Last data filed at 03/12/2019 1314 Gross per 24 hour  Intake 357.5 ml  Output 1550 ml  Net -1192.5 ml   Filed Weights   03/10/19 1436 03/12/19 0230  Weight: 63 kg 44.8 kg     Examination:  Constitutional: NAD Eyes: no icterus  ENMT: Mucous membranes are moist.  Respiratory: clear to auscultation bilaterally, no wheezing, no crackles.  Cardiovascular: Regular rate and rhythm, 3/6 SEM. No LE edema.  Abdomen: no tenderness. Bowel sounds positive.  Musculoskeletal: no clubbing / cyanosis. Skin: no rashes, Neurologic: non focal    Data Reviewed: I have independently reviewed following labs and imaging studies   CBC: Recent Labs  Lab 03/10/19 1521 03/11/19 0553 03/11/19 1933  WBC 4.5 5.0  --   NEUTROABS 2.7  --   --   HGB 7.1* 6.5* 7.7*  HCT 25.3* 22.9* 26.6*  MCV 68.9* 68.8*  --   PLT 159 150  --    Basic Metabolic Panel: Recent Labs  Lab 03/10/19 1521 03/11/19 0553  NA 132* 136  K 4.8 5.0  CL 106 110  CO2 18* 17*  GLUCOSE 102* 99  BUN 30* 29*  CREATININE 1.36* 1.24  CALCIUM 8.7* 8.5*   GFR: Estimated Creatinine Clearance: 21.6 mL/min (by C-G formula based on SCr of 1.24 mg/dL). Liver Function Tests: Recent Labs  Lab 03/10/19 1521  AST 22  ALT 10  ALKPHOS 142*  BILITOT 1.4*  PROT 7.8  ALBUMIN 3.3*   No results for input(s): LIPASE, AMYLASE in the last 168 hours. No results for input(s): AMMONIA in the last 168 hours. Coagulation Profile: No results for input(s): INR, PROTIME in the last 168 hours. Cardiac Enzymes: No results for input(s): CKTOTAL, CKMB, CKMBINDEX, TROPONINI in the last 168 hours. BNP (last 3 results) No results for input(s): PROBNP in the last 8760 hours. HbA1C: No results for input(s): HGBA1C in the last 72 hours. CBG: No results for input(s): GLUCAP in the last 168 hours. Lipid Profile: No results for input(s): CHOL, HDL, LDLCALC, TRIG, CHOLHDL, LDLDIRECT in the last 72 hours. Thyroid Function Tests: No results for input(s): TSH, T4TOTAL, FREET4, T3FREE, THYROIDAB in the last 72 hours. Anemia Panel: Recent Labs    03/11/19 0553  VITAMINB12 687  FOLATE 20.3  FERRITIN 13*  TIBC 534*  IRON 15*   RETICCTPCT 1.2   Urine analysis:    Component Value Date/Time   COLORURINE STRAW (A) 01/23/2018 1732   APPEARANCEUR CLEAR 01/23/2018 1732   LABSPEC 1.008 01/23/2018 1732   PHURINE 6.0 01/23/2018 1732   GLUCOSEU NEGATIVE 01/23/2018 1732   HGBUR NEGATIVE 01/23/2018 1732   BILIRUBINUR NEGATIVE 01/23/2018 1732   KETONESUR NEGATIVE 01/23/2018 1732   PROTEINUR NEGATIVE 01/23/2018 1732   NITRITE NEGATIVE 01/23/2018 1732   LEUKOCYTESUR TRACE (A) 01/23/2018 1732   Sepsis Labs: Invalid input(s): PROCALCITONIN, LACTICIDVEN  Recent Results (from the past 240 hour(s))  SARS CORONAVIRUS 2 (TAT 6-24 HRS) Nasopharyngeal Nasopharyngeal Swab     Status: Abnormal   Collection Time: 03/10/19  3:21 PM   Specimen: Nasopharyngeal Swab  Result Value Ref Range Status   SARS Coronavirus 2 POSITIVE (A) NEGATIVE Final    Comment: RESULT CALLED TO, READ BACK BY AND VERIFIED WITH: J HARDEN,RN 0104 03/11/2019 D BRADLEY (NOTE) SARS-CoV-2 target nucleic acids are DETECTED. The SARS-CoV-2 RNA is generally  detectable in upper and lower respiratory specimens during the acute phase of infection. Positive results are indicative of active infection with SARS-CoV-2. Clinical  correlation with patient history and other diagnostic information is necessary to determine patient infection status. Positive results do  not rule out bacterial infection or co-infection with other viruses. The expected result is Negative. Fact Sheet for Patients: SugarRoll.be Fact Sheet for Healthcare Providers: https://www.woods-mathews.com/ This test is not yet approved or cleared by the [place] FDA and  has been authorized for detection and/or diagnosis of SARS-CoV-2 by FDA under an Emergency Use Authorization (EUA). This EUA will remain  in effect (meaning this test can be used) for  the duration of the COVID-19 declaration under Section 564(b)(1) of the Act, 21 U.S.C. section  360bbb-3(b)(1), unless the authorization is terminated or revoked sooner. Performed at [facility], [place] [address], [place], [place] [address]       Radiology Studies: Dg Chest Portable 1 View  Result Date: 03/10/2019 CLINICAL DATA:  Cough, COVID-19 exposure EXAM: PORTABLE CHEST 1 VIEW COMPARISON:  06/02/2018 FINDINGS: Dual lead left-sided implanted cardiac device. Stable mild cardiomegaly. Calcific aortic knob. Diffuse prominence of the pulmonary interstitium bilaterally, increased from prior. No pleural effusion or pneumothorax. Severe degenerative changes of the right shoulder. IMPRESSION: Diffusely increased interstitial opacities throughout both lungs, which may reflect edema or atypical/viral infection. Electronically Signed   By: Davina Poke M.D.   On: 03/10/2019 15:48    Marzetta Board, MD, PhD [facility]  Contact via  [email or web address]  [place] P: [phone] F: [phone]

## 2019-03-12 NOTE — Progress Notes (Signed)
Pharmacy Brief Note   O:  ALT: 10  CXR: Diffusely increased interstitial opacities throughout both lungs, which may reflect edema or atypical/viral infection.  SpO2: 94% on RA    A/P:  Patient meets requirements for remdesivir therapy .  Will start  remdesivir 200 mg IV x 1  followed by 100 mg IV daily x 4 days.  Monitor ALT  Royetta Asal, PharmD, BCPS 03/12/2019 6:42 AM

## 2019-03-12 NOTE — Progress Notes (Signed)
Tried calling ginger (niece) to update on pt's transfer to our facility - no answer, message left to call back.

## 2019-03-12 NOTE — Plan of Care (Signed)
Pt admitted from [facility] via [place].Vitals stable on RA. Pt alert and oriented but very hard of hearing, hearing aid needs new batteries. No complaints verbalized. No respiratory distress noted. Skin swarm done, dried abrasion on L elbow noted, cleaned and foam dressing applied for protection. Sacral dressing also applied on intact skin for prophylaxis. Incontinent of urine, condom catheter replaced. Admission questions and assessment done.  On call MD paged, awaiting admission orders. Will call family this AM to inform of transfer. Will monitor.  Problem: Education: Goal: Knowledge of risk factors and measures for prevention of condition will improve Outcome: Progressing   Problem: Coping: Goal: Psychosocial and spiritual needs will be supported Outcome: Progressing   Problem: Respiratory: Goal: Will maintain a patent airway Outcome: Progressing Goal: Complications related to the disease process, condition or treatment will be avoided or minimized Outcome: Progressing   Problem: Education: Goal: Ability to demonstrate management of disease process will improve Outcome: Progressing Goal: Ability to verbalize understanding of medication therapies will improve Outcome: Progressing Goal: Individualized Educational Video(s) Outcome: Progressing   Problem: Activity: Goal: Capacity to carry out activities will improve Outcome: Progressing   Problem: Cardiac: Goal: Ability to achieve and maintain adequate cardiopulmonary perfusion will improve Outcome: Progressing

## 2019-03-13 LAB — COMPREHENSIVE METABOLIC PANEL
ALT: 10 U/L (ref 0–44)
AST: 22 U/L (ref 15–41)
Albumin: 3 g/dL — ABNORMAL LOW (ref 3.5–5.0)
Alkaline Phosphatase: 109 U/L (ref 38–126)
Anion gap: 11 (ref 5–15)
BUN: 44 mg/dL — ABNORMAL HIGH (ref 8–23)
CO2: 19 mmol/L — ABNORMAL LOW (ref 22–32)
Calcium: 8.5 mg/dL — ABNORMAL LOW (ref 8.9–10.3)
Chloride: 100 mmol/L (ref 98–111)
Creatinine, Ser: 1.32 mg/dL — ABNORMAL HIGH (ref 0.61–1.24)
GFR calc Af Amer: 52 mL/min — ABNORMAL LOW (ref >60)
GFR calc non Af Amer: 45 mL/min — ABNORMAL LOW (ref >60)
Glucose, Bld: 132 mg/dL — ABNORMAL HIGH (ref 70–99)
Potassium: 4.7 mmol/L (ref 3.5–5.1)
Sodium: 130 mmol/L — ABNORMAL LOW (ref 135–145)
Total Bilirubin: 1.4 mg/dL — ABNORMAL HIGH (ref 0.3–1.2)
Total Protein: 6.9 g/dL (ref 6.5–8.1)

## 2019-03-13 LAB — MAGNESIUM: Magnesium: 1.9 mg/dL (ref 1.7–2.4)

## 2019-03-13 LAB — CBC
HCT: 25.1 % — ABNORMAL LOW (ref 39.0–52.0)
Hemoglobin: 7.6 g/dL — ABNORMAL LOW (ref 13.0–17.0)
MCH: 20.5 pg — ABNORMAL LOW (ref 26.0–34.0)
MCHC: 30.3 g/dL (ref 30.0–36.0)
MCV: 67.8 fL — ABNORMAL LOW (ref 80.0–100.0)
Platelets: 180 10*3/uL (ref 150–400)
RBC: 3.7 MIL/uL — ABNORMAL LOW (ref 4.22–5.81)
RDW: 24.4 % — ABNORMAL HIGH (ref 11.5–15.5)
WBC: 9.6 10*3/uL (ref 4.0–10.5)
nRBC: 0.2 % (ref 0.0–0.2)

## 2019-03-13 LAB — C-REACTIVE PROTEIN: CRP: 1 mg/dL — ABNORMAL HIGH (ref <1.0)

## 2019-03-13 NOTE — Progress Notes (Signed)
PROGRESS NOTE  Shawn Frye [id number] DOB: 1918-12-23 DOA: 03/10/2019 PCP: [name], MD   LOS: 2 days   Brief Narrative / Interim history: 85 year old male with medical history significant for cardiomyopathy with systolic congestive heart failure (last ejection fraction 20%), cardiac pacemaker with history of second-degree AV block, AAA, iron deficiency anemia and GERD; who presented to the hospital secondary to increased shortness of breath and coughing.  Patient found with significant elevated BNP, increased lower extremity swelling and chest x-ray demonstrating bilateral vascular congestion and diffuse increased interstitial opacities.  Patient has 24/7 care at home by caregivers, was exposed to positive COVID contact approx 2 weeks ago. Symptoms has been present and slightly worsening in the last 4 days.   Subjective / 24h Interval events: No complaints this morning, denies any shortness of breath.  He states that he is feeling well.  Assessment & Plan: Active Problems:   Aneurysm of abdominal vessel (HCC)   Mobitz type 2 second degree atrioventricular block   Acute on chronic systolic CHF (congestive heart failure) (HCC)   Dilated cardiomyopathy (HCC)   Pacemaker   Acute on chronic combined systolic and diastolic CHF (congestive heart failure) (HCC)   Shortness of breath   COVID-19 virus infection   Principal Problem Covid-19 Viral Illness / Pneumonia -Patient admitted to the hospital with increased shortness of breath and coughing, he was tested positive for SARS-CoV-2 and chest x-ray showed pneumonia -Started on Decadron, continue, also started on remdesivir given infiltrates on the chest x-ray -Clinically stable, remains on room air, continue medications as below.  Minimal inflammation noted on labs  COVID-19 Labs  Recent Labs    03/10/19 1715 03/11/19 0553 03/13/19 0220  FERRITIN  --  13*  --   CRP 1.4*  --  1.0*    Lab Results  Component Value Date   SARSCOV2NAA POSITIVE (A) 03/10/2019   SARSCOV2NAA Not Detected 11/26/2018   Active Problems Acute on chronic systolic CHF -On Lasix, there was a degree of fluid overload but creatinine is going up a little bit today and his blood pressure is soft, hold Lasix today and reassess tomorrow  AAA -Outpatient follow-up  Anemia of chronic disease -No bleeding, significant iron deficiency noted, started on iron supplementation -Transfuse 1 unit of packed red blood cells, hemoglobin slightly trending down, no bleeding, may need another unit if less than 7  GERD -Continue PPI  Moderate protein malnutrition -Continue Ensure  Deconditioning/weakness -PT  Scheduled Meds: . dexamethasone (DECADRON) injection  6 mg Intravenous QHS  . feeding supplement (ENSURE ENLIVE)  237 mL Oral TID BM  . ferrous gluconate  324 mg Oral TID WC  . multivitamin with minerals  1 tablet Oral Daily  . pantoprazole  40 mg Oral Daily  . sodium chloride flush  3 mL Intravenous Q12H  . spironolactone  25 mg Oral BID   Continuous Infusions: . sodium chloride    . remdesivir 100 mg in NS 250 mL 100 mg (03/13/19 1126)   PRN Meds:.sodium chloride, acetaminophen, ALPRAZolam, ondansetron (ZOFRAN) IV, sodium chloride flush  DVT prophylaxis: SCDs Code Status: DNR Family Communication: d/w niece Johney Frame [phone] Disposition Plan: home when ready   Consultants:  None   Procedures:  None   Microbiology: None   Antimicrobials: None    Objective: Vitals:   03/12/19 1935 03/13/19 0425 03/13/19 0552 03/13/19 0726  BP:  (!) 88/54 90/74 91/63   Pulse: 95 83 93 89  Resp: 18 (!) 21 19 18  Temp:  97.6 F (36.4 C)  97.7 F (36.5 C)  TempSrc:  Oral  Oral  SpO2: 99% 96% 94% 98%  Weight:      Height:        Intake/Output Summary (Last 24 hours) at 03/13/2019 1139 Last data filed at 03/13/2019 0854 Gross per 24 hour  Intake 430 ml  Output 1100 ml  Net -670 ml   Filed Weights   03/10/19 1436  03/12/19 0230  Weight: 63 kg 44.8 kg    Examination:  Constitutional: no distress  Eyes: no scleral icterus  ENMT: Mucous membranes are moist.  Respiratory: Clear, no wheezing, no crackles Cardiovascular: Regular rate and rhythm, 3/6 SEM, no edema Abdomen: Soft, nontender, nondistended, positive bowel sounds Musculoskeletal: no clubbing / cyanosis. Skin: No rashes seen Neurologic: non focal    Data Reviewed: I have independently reviewed following labs and imaging studies   CBC: Recent Labs  Lab 03/10/19 1521 03/11/19 0553 03/11/19 1933 03/13/19 0220  WBC 4.5 5.0  --  9.6  NEUTROABS 2.7  --   --   --   HGB 7.1* 6.5* 7.7* 7.6*  HCT 25.3* 22.9* 26.6* 25.1*  MCV 68.9* 68.8*  --  67.8*  PLT 159 150  --  99991111   Basic Metabolic Panel: Recent Labs  Lab 03/10/19 1521 03/11/19 0553 03/13/19 0220  NA 132* 136 130*  K 4.8 5.0 4.7  CL 106 110 100  CO2 18* 17* 19*  GLUCOSE 102* 99 132*  BUN 30* 29* 44*  CREATININE 1.36* 1.24 1.32*  CALCIUM 8.7* 8.5* 8.5*  MG  --   --  1.9   GFR: Estimated Creatinine Clearance: 20.3 mL/min (A) (by C-G formula based on SCr of 1.32 mg/dL (H)). Liver Function Tests: Recent Labs  Lab 03/10/19 1521 03/13/19 0220  AST 22 22  ALT 10 10  ALKPHOS 142* 109  BILITOT 1.4* 1.4*  PROT 7.8 6.9  ALBUMIN 3.3* 3.0*   No results for input(s): LIPASE, AMYLASE in the last 168 hours. No results for input(s): AMMONIA in the last 168 hours. Coagulation Profile: No results for input(s): INR, PROTIME in the last 168 hours. Cardiac Enzymes: No results for input(s): CKTOTAL, CKMB, CKMBINDEX, TROPONINI in the last 168 hours. BNP (last 3 results) No results for input(s): PROBNP in the last 8760 hours. HbA1C: No results for input(s): HGBA1C in the last 72 hours. CBG: No results for input(s): GLUCAP in the last 168 hours. Lipid Profile: No results for input(s): CHOL, HDL, LDLCALC, TRIG, CHOLHDL, LDLDIRECT in the last 72 hours. Thyroid Function Tests: No  results for input(s): TSH, T4TOTAL, FREET4, T3FREE, THYROIDAB in the last 72 hours. Anemia Panel: Recent Labs    03/11/19 0553  VITAMINB12 687  FOLATE 20.3  FERRITIN 13*  TIBC 534*  IRON 15*  RETICCTPCT 1.2   Urine analysis:    Component Value Date/Time   COLORURINE STRAW (A) 01/23/2018 1732   APPEARANCEUR CLEAR 01/23/2018 1732   LABSPEC 1.008 01/23/2018 1732   PHURINE 6.0 01/23/2018 1732   GLUCOSEU NEGATIVE 01/23/2018 1732   HGBUR NEGATIVE 01/23/2018 1732   BILIRUBINUR NEGATIVE 01/23/2018 1732   KETONESUR NEGATIVE 01/23/2018 1732   PROTEINUR NEGATIVE 01/23/2018 1732   NITRITE NEGATIVE 01/23/2018 1732   LEUKOCYTESUR TRACE (A) 01/23/2018 1732   Sepsis Labs: Invalid input(s): PROCALCITONIN, LACTICIDVEN  Recent Results (from the past 240 hour(s))  SARS CORONAVIRUS 2 (TAT 6-24 HRS) Nasopharyngeal Nasopharyngeal Swab     Status: Abnormal   Collection Time: 03/10/19  3:21  PM   Specimen: Nasopharyngeal Swab  Result Value Ref Range Status   SARS Coronavirus 2 POSITIVE (A) NEGATIVE Final    Comment: RESULT CALLED TO, READ BACK BY AND VERIFIED WITH: J HARDEN,RN 0104 03/11/2019 D BRADLEY (NOTE) SARS-CoV-2 target nucleic acids are DETECTED. The SARS-CoV-2 RNA is generally detectable in upper and lower respiratory specimens during the acute phase of infection. Positive results are indicative of active infection with SARS-CoV-2. Clinical  correlation with patient history and other diagnostic information is necessary to determine patient infection status. Positive results do  not rule out bacterial infection or co-infection with other viruses. The expected result is Negative. Fact Sheet for Patients: SugarRoll.be Fact Sheet for Healthcare Providers: https://www.woods-mathews.com/ This test is not yet approved or cleared by the [place] FDA and  has been authorized for detection and/or diagnosis of SARS-CoV-2 by FDA under an Emergency  Use Authorization (EUA). This EUA will remain  in effect (meaning this test can be used) for  the duration of the COVID-19 declaration under Section 564(b)(1) of the Act, 21 U.S.C. section 360bbb-3(b)(1), unless the authorization is terminated or revoked sooner. Performed at [facility], [place] [address], [place], [place] [address]       Radiology Studies: No results found.  Marzetta Board, MD, PhD [facility]  Contact via  [email or web address]  [place] P: [phone] F: [phone]

## 2019-03-14 LAB — COMPREHENSIVE METABOLIC PANEL
ALT: 11 U/L (ref 0–44)
AST: 21 U/L (ref 15–41)
Albumin: 2.8 g/dL — ABNORMAL LOW (ref 3.5–5.0)
Alkaline Phosphatase: 95 U/L (ref 38–126)
Anion gap: 9 (ref 5–15)
BUN: 39 mg/dL — ABNORMAL HIGH (ref 8–23)
CO2: 20 mmol/L — ABNORMAL LOW (ref 22–32)
Calcium: 8.5 mg/dL — ABNORMAL LOW (ref 8.9–10.3)
Chloride: 101 mmol/L (ref 98–111)
Creatinine, Ser: 1.01 mg/dL (ref 0.61–1.24)
GFR calc Af Amer: >60 mL/min (ref >60)
GFR calc non Af Amer: >60 mL/min (ref >60)
Glucose, Bld: 182 mg/dL — ABNORMAL HIGH (ref 70–99)
Potassium: 5 mmol/L (ref 3.5–5.1)
Sodium: 130 mmol/L — ABNORMAL LOW (ref 135–145)
Total Bilirubin: 1 mg/dL (ref 0.3–1.2)
Total Protein: 6.4 g/dL — ABNORMAL LOW (ref 6.5–8.1)

## 2019-03-14 LAB — CBC
HCT: 25.2 % — ABNORMAL LOW (ref 39.0–52.0)
Hemoglobin: 7.5 g/dL — ABNORMAL LOW (ref 13.0–17.0)
MCH: 20.4 pg — ABNORMAL LOW (ref 26.0–34.0)
MCHC: 29.8 g/dL — ABNORMAL LOW (ref 30.0–36.0)
MCV: 68.7 fL — ABNORMAL LOW (ref 80.0–100.0)
Platelets: 169 10*3/uL (ref 150–400)
RBC: 3.67 MIL/uL — ABNORMAL LOW (ref 4.22–5.81)
RDW: 24.7 % — ABNORMAL HIGH (ref 11.5–15.5)
WBC: 10 10*3/uL (ref 4.0–10.5)
nRBC: 0.3 % — ABNORMAL HIGH (ref 0.0–0.2)

## 2019-03-14 LAB — C-REACTIVE PROTEIN: CRP: 0.8 mg/dL (ref <1.0)

## 2019-03-14 MED ORDER — SENNOSIDES-DOCUSATE SODIUM 8.6-50 MG PO TABS
2.0000 | ORAL_TABLET | Freq: Once | ORAL | Status: AC
  Administered 2019-03-14: 14:00:00 2 via ORAL
  Filled 2019-03-14: qty 2

## 2019-03-14 NOTE — Plan of Care (Signed)
  Problem: Respiratory: Goal: Will maintain a patent airway Outcome: Progressing Goal: Complications related to the disease process, condition or treatment will be avoided or minimized Outcome: Progressing   

## 2019-03-14 NOTE — Progress Notes (Signed)
PROGRESS NOTE  Shawn Frye [id number] DOB: 21-Nov-1918 DOA: 03/10/2019 PCP: [name], MD   LOS: 3 days   Brief Narrative / Interim history: 85 year old male with medical history significant for cardiomyopathy with systolic congestive heart failure (last ejection fraction 20%), cardiac pacemaker with history of second-degree AV block, AAA, iron deficiency anemia and GERD; who presented to the hospital secondary to increased shortness of breath and coughing.  Patient found with significant elevated BNP, increased lower extremity swelling and chest x-ray demonstrating bilateral vascular congestion and diffuse increased interstitial opacities.  Patient has 24/7 care at home by caregivers, was exposed to positive COVID contact approx 2 weeks ago. Symptoms has been present and slightly worsening in the last 4 days.   Subjective / 24h Interval events: Feeling well this morning.  Denies shortness of breath, pain or any other form of discomfort  Assessment & Plan: Active Problems:   Aneurysm of abdominal vessel (HCC)   Mobitz type 2 second degree atrioventricular block   Acute on chronic systolic CHF (congestive heart failure) (HCC)   Dilated cardiomyopathy (HCC)   Pacemaker   Acute on chronic combined systolic and diastolic CHF (congestive heart failure) (HCC)   Shortness of breath   COVID-19 virus infection   Principal Problem Covid-19 Viral Illness / Pneumonia -Patient admitted to the hospital with increased shortness of breath and coughing, he was tested positive for SARS-CoV-2 and chest x-ray showed pneumonia -Started on Decadron, continue, also started on remdesivir given infiltrates on the chest x-ray -Remains stable on room air, remdesivir's last dose is 11/3  COVID-19 Labs  Recent Labs    03/13/19 0220 03/14/19 0143  CRP 1.0* 0.8    Lab Results  Component Value Date   SARSCOV2NAA POSITIVE (A) 03/10/2019   [place] Not Detected 11/26/2018   Active Problems  Acute on chronic systolic CHF -On Lasix, there was a degree of fluid overload but creatinine was going up a little bit 10/31 and his blood pressure was soft, Lasix now on hold -Creatinine better today, euvolemic, soft blood pressure in the 90s, hold Lasix as well today  AAA -Outpatient follow-up  Anemia of chronic disease -No bleeding, significant iron deficiency noted, started on iron supplementation -Transfuse 1 unit of packed red blood cells, hemoglobin slightly trended down but stable today compared to yesterday.  Continue to monitor.  No bleeding  GERD -Continue PPI  Moderate protein malnutrition -Continue Ensure  Deconditioning/weakness -PT  Scheduled Meds: . dexamethasone (DECADRON) injection  6 mg Intravenous QHS  . feeding supplement (ENSURE ENLIVE)  237 mL Oral TID BM  . ferrous gluconate  324 mg Oral TID WC  . multivitamin with minerals  1 tablet Oral Daily  . pantoprazole  40 mg Oral Daily  . sodium chloride flush  3 mL Intravenous Q12H  . spironolactone  25 mg Oral BID   Continuous Infusions: . sodium chloride    . remdesivir 100 mg in NS 250 mL 100 mg (03/13/19 1126)   PRN Meds:.sodium chloride, acetaminophen, ALPRAZolam, ondansetron (ZOFRAN) IV, sodium chloride flush  DVT prophylaxis: SCDs Code Status: DNR Family Communication: d/w niece Johney Frame [phone] Disposition Plan: home when ready   Consultants:  None   Procedures:  None   Microbiology: None   Antimicrobials: None    Objective: Vitals:   03/14/19 0330 03/14/19 0333 03/14/19 0700 03/14/19 0823  BP: 91/62  (!) 92/58 95/63  Pulse: 83  80   Resp: 20  16   Temp: 97.9 F (36.6 C)  97.8 F (36.6 C)   TempSrc: Oral  Axillary   SpO2: 96%  97%   Weight:  52.1 kg    Height:        Intake/Output Summary (Last 24 hours) at 03/14/2019 1014 Last data filed at 03/14/2019 0329 Gross per 24 hour  Intake 858.62 ml  Output 450 ml  Net 408.62 ml   Filed Weights   03/10/19 1436  03/12/19 0230 03/14/19 0333  Weight: 63 kg 44.8 kg 52.1 kg    Examination:  Constitutional: NAD, sleeping when I entered his room but wakes up easily Eyes: No icterus seen ENMT: Moist mucous membranes Respiratory: Clear, no wheezing, no crackles Cardiovascular: Regular rate and rhythm, 3/6 systolic murmur, no edema Abdomen: Soft, NT, ND, positive bowel sounds Musculoskeletal: no clubbing / cyanosis. Skin: No rashes Neurologic: non focal    Data Reviewed: I have independently reviewed following labs and imaging studies   CBC: Recent Labs  Lab 03/10/19 1521 03/11/19 0553 03/11/19 1933 03/13/19 0220 03/14/19 0143  WBC 4.5 5.0  --  9.6 10.0  NEUTROABS 2.7  --   --   --   --   HGB 7.1* 6.5* 7.7* 7.6* 7.5*  HCT 25.3* 22.9* 26.6* 25.1* 25.2*  MCV 68.9* 68.8*  --  67.8* 68.7*  PLT 159 150  --  180 [id number]   Basic Metabolic Panel: Recent Labs  Lab 03/10/19 1521 03/11/19 0553 03/13/19 0220 03/14/19 0143  NA 132* 136 130* 130*  K 4.8 5.0 4.7 5.0  CL 106 110 100 101  CO2 18* 17* 19* 20*  GLUCOSE 102* 99 132* 182*  BUN 30* 29* 44* 39*  CREATININE 1.36* 1.24 1.32* 1.01  CALCIUM 8.7* 8.5* 8.5* 8.5*  MG  --   --  1.9  --    GFR: Estimated Creatinine Clearance: 30.8 mL/min (by C-G formula based on SCr of 1.01 mg/dL). Liver Function Tests: Recent Labs  Lab 03/10/19 1521 03/13/19 0220 03/14/19 0143  AST 22 22 21   ALT 10 10 11   ALKPHOS 142* 109 95  BILITOT 1.4* 1.4* 1.0  PROT 7.8 6.9 6.4*  ALBUMIN 3.3* 3.0* 2.8*   No results for input(s): LIPASE, AMYLASE in the last 168 hours. No results for input(s): AMMONIA in the last 168 hours. Coagulation Profile: No results for input(s): INR, PROTIME in the last 168 hours. Cardiac Enzymes: No results for input(s): CKTOTAL, CKMB, CKMBINDEX, TROPONINI in the last 168 hours. BNP (last 3 results) No results for input(s): PROBNP in the last 8760 hours. HbA1C: No results for input(s): HGBA1C in the last 72 hours. CBG: No results  for input(s): GLUCAP in the last 168 hours. Lipid Profile: No results for input(s): CHOL, HDL, LDLCALC, TRIG, CHOLHDL, LDLDIRECT in the last 72 hours. Thyroid Function Tests: No results for input(s): TSH, T4TOTAL, FREET4, T3FREE, THYROIDAB in the last 72 hours. Anemia Panel: No results for input(s): VITAMINB12, FOLATE, FERRITIN, TIBC, IRON, RETICCTPCT in the last 72 hours. Urine analysis:    Component Value Date/Time   COLORURINE STRAW (A) 01/23/2018 1732   APPEARANCEUR CLEAR 01/23/2018 1732   LABSPEC 1.008 01/23/2018 1732   PHURINE 6.0 01/23/2018 1732   GLUCOSEU NEGATIVE 01/23/2018 1732   HGBUR NEGATIVE 01/23/2018 1732   BILIRUBINUR NEGATIVE 01/23/2018 1732   KETONESUR NEGATIVE 01/23/2018 1732   PROTEINUR NEGATIVE 01/23/2018 1732   NITRITE NEGATIVE 01/23/2018 1732   LEUKOCYTESUR TRACE (A) 01/23/2018 1732   Sepsis Labs: Invalid input(s): PROCALCITONIN, LACTICIDVEN  Recent Results (from the past 240 hour(s))  SARS  CORONAVIRUS 2 (TAT 6-24 HRS) Nasopharyngeal Nasopharyngeal Swab     Status: Abnormal   Collection Time: 03/10/19  3:21 PM   Specimen: Nasopharyngeal Swab  Result Value Ref Range Status   SARS Coronavirus 2 POSITIVE (A) NEGATIVE Final    Comment: RESULT CALLED TO, READ BACK BY AND VERIFIED WITH: J HARDEN,RN 0104 03/11/2019 D BRADLEY (NOTE) SARS-CoV-2 target nucleic acids are DETECTED. The SARS-CoV-2 RNA is generally detectable in upper and lower respiratory specimens during the acute phase of infection. Positive results are indicative of active infection with SARS-CoV-2. Clinical  correlation with patient history and other diagnostic information is necessary to determine patient infection status. Positive results do  not rule out bacterial infection or co-infection with other viruses. The expected result is Negative. Fact Sheet for Patients: SugarRoll.be Fact Sheet for Healthcare Providers: https://www.woods-mathews.com/  This test is not yet approved or cleared by the [place] FDA and  has been authorized for detection and/or diagnosis of SARS-CoV-2 by FDA under an Emergency Use Authorization (EUA). This EUA will remain  in effect (meaning this test can be used) for  the duration of the COVID-19 declaration under Section 564(b)(1) of the Act, 21 U.S.C. section 360bbb-3(b)(1), unless the authorization is terminated or revoked sooner. Performed at [facility], [place] [address] [place], [place] [address]       Radiology Studies: No results found.  Marzetta Board, MD, PhD [facility]  Contact via  [email or web address]  [place] P: [phone] F: [phone]

## 2019-03-14 NOTE — Progress Notes (Signed)
Occupational Therapy Evaluation Patient Details Name: Shawn Frye MRN: [id number] DOB: Nov 29, 1918 Today's Date: 03/14/2019    History of Present Illness 85 year old male with medical history significant for cardiomyopathy with systolic congestive heart failure (last ejection fraction 20%), cardiac pacemaker with history of second-degree AV block, AAA, iron deficiency anemia and GERD; who presented to the hospital secondary to increased shortness of breath and coughing.  Patient found with significant elevated BNP, increased lower extremity swelling and chest x-ray demonstrating bilateral vascular congestion and diffuse increased interstitial opacities.  Patient has 24/7 care at home by caregivers, was exposed to positive COVID contact approx 2 weeks ago. Symptoms has been present and slightly worsening in the last 4 days.   Clinical Impression   PTA, pt lived at home with 24/7 caregivers. Pt able to transfer to [facility] then to recliner, taking @ 5 steps with mod A on RA with SpO2 remaining above 90; 2/4 DOE. Incentive spirometer x 10 - able to pull @ 1073ml. Pt overall deconditioned - feel pt will benefit from [facility] to return to PLOF. Will follow acutely to facilitate safe DC home. Pt extremely HOH - communicated well with use of writing on "white board".     Follow Up Recommendations  Supervision/Assistance - 24 hour;Home health OT    Equipment Recommendations  None recommended by OT    Recommendations for Other Services       Precautions / Restrictions Precautions Precautions: Fall      Mobility Bed Mobility Overal bed mobility: Needs Assistance Bed Mobility: Supine to Sit   Sidelying to sit: Max assist       General bed mobility comments: Pt asisting with moving legs off bed; heavy useof rails  Transfers Overall transfer level: Needs assistance   Transfers: Sit to/from Stand;Stand Pivot Transfers Sit to Stand: Mod assist Stand pivot transfers: Mod assist             Balance Overall balance assessment: Needs assistance   Sitting balance-Leahy Scale: Fair       Standing balance-Leahy Scale: Poor                             ADL either performed or assessed with clinical judgement   ADL Overall ADL's : Needs assistance/impaired Eating/Feeding: Set up;Sitting   Grooming: Minimal assistance;Sitting   Upper Body Bathing: Minimal assistance;Sitting   Lower Body Bathing: Moderate assistance;Sit to/from stand   Upper Body Dressing : Moderate assistance;Sitting   Lower Body Dressing: Maximal assistance;Sit to/from stand   Toilet Transfer: Moderate assistance;RW     Toileting - Clothing Manipulation Details (indicate cue type and reason): condom cath; aware when he is urinating     Functional mobility during ADLs: Moderate assistance;Rolling walker;Cueing for safety General ADL Comments: 2/4 DOE     Museum/gallery curator      Pertinent Vitals/Pain Pain Assessment: Faces Faces Pain Scale: Hurts little more Pain Location: with shoulder AROM Pain Descriptors / Indicators: Discomfort;Grimacing Pain Intervention(s): Limited activity within patient's tolerance     Hand Dominance Right   Extremity/Trunk Assessment Upper Extremity Assessment Upper Extremity Assessment: Generalized weakness(limited B shoulder flexion @ 90)   Lower Extremity Assessment Lower Extremity Assessment: Generalized weakness   Cervical / Trunk Assessment Cervical / Trunk Assessment: Kyphotic   Communication Communication Communication: HOH   Cognition Arousal/Alertness: Awake/alert Behavior During Therapy: WFL for tasks assessed/performed Overall Cognitive Status:  No family/caregiver present to determine baseline cognitive functioning                                     General Comments  Given white baord and marker for nursing to communicate due to [facility]    Exercises Exercises: Other exercises;General Upper  Extremity General Exercises - Upper Extremity Shoulder Flexion: Strengthening;AROM;Both;10 reps;Seated Shoulder ABduction: AROM;Strengthening;Both;10 reps;Seated Other Exercises Other Exercises: incentive spirometer x 10   Shoulder Instructions      Home Living Family/patient expects to be discharged to:: Private residence Living Arrangements: Non-relatives/Friends Available Help at Discharge: Personal care attendant;Available 24 hours/day Type of Home: House Home Access: Level entry     Home Layout: One level                          Prior Functioning/Environment Level of [place]: Needs assistance  Gait / Transfers Assistance Needed: was on a cane, uses rollator since illness ADL's / Homemaking Assistance Needed: caregivers 24/7- has 4 different ones Communication / Swallowing Assistance Needed: HOH          OT Problem List: Decreased strength;Decreased activity tolerance;Impaired balance (sitting and/or standing);Decreased safety awareness;Decreased knowledge of use of DME or AE;Cardiopulmonary status limiting activity      OT Treatment/Interventions: Self-care/ADL training;Therapeutic exercise;Neuromuscular education;Energy conservation;DME and/or AE instruction;Therapeutic activities;Patient/family education;Balance training    OT Goals(Current goals can be found in the care plan section) Acute Rehab OT Goals Patient Stated Goal: to get stronger and go back home OT Goal Formulation: Patient unable to participate in goal setting Time For Goal Achievement: 03/28/19 Potential to Achieve Goals: Good  OT Frequency: Min 3X/week   Barriers to D/C:            Co-evaluation              AM-PAC OT "6 Clicks" Daily Activity     Outcome Measure Help from another person eating meals?: A Little Help from another person taking care of personal grooming?: A Little Help from another person toileting, which includes using toliet, bedpan, or urinal?: A  Lot Help from another person bathing (including washing, rinsing, drying)?: A Lot Help from another person to put on and taking off regular upper body clothing?: A Lot Help from another person to put on and taking off regular lower body clothing?: A Lot 6 Click Score: 14   End of Session Equipment Utilized During Treatment: Surveyor, mining Communication: Mobility status  Activity Tolerance: Patient tolerated treatment well Patient left: in chair;with chair alarm set;with call bell/phone within reach  OT Visit Diagnosis: Unsteadiness on feet (R26.81);Other abnormalities of gait and mobility (R26.89);Muscle weakness (generalized) (M62.81)                Time: [id number] OT Time Calculation (min): 32 min Charges:  OT General Charges $OT Visit: 1 Visit OT Evaluation $OT Eval Moderate Complexity: 1 Mod OT Treatments $Self Care/Home Management : 8-22 mins  Maurie Boettcher, OT/L   Acute OT Clinical Specialist Acute Rehabilitation Services Pager [phone] Office [phone]   [facility] 03/14/2019, 5:05 PM

## 2019-03-15 LAB — BASIC METABOLIC PANEL
Anion gap: 8 (ref 5–15)
BUN: 41 mg/dL — ABNORMAL HIGH (ref 8–23)
CO2: 22 mmol/L (ref 22–32)
Calcium: 8.6 mg/dL — ABNORMAL LOW (ref 8.9–10.3)
Chloride: 99 mmol/L (ref 98–111)
Creatinine, Ser: 0.93 mg/dL (ref 0.61–1.24)
GFR calc Af Amer: >60 mL/min (ref >60)
GFR calc non Af Amer: >60 mL/min (ref >60)
Glucose, Bld: 206 mg/dL — ABNORMAL HIGH (ref 70–99)
Potassium: 4.8 mmol/L (ref 3.5–5.1)
Sodium: 129 mmol/L — ABNORMAL LOW (ref 135–145)

## 2019-03-15 LAB — CBC
HCT: 25.6 % — ABNORMAL LOW (ref 39.0–52.0)
Hemoglobin: 7.6 g/dL — ABNORMAL LOW (ref 13.0–17.0)
MCH: 20.5 pg — ABNORMAL LOW (ref 26.0–34.0)
MCHC: 29.7 g/dL — ABNORMAL LOW (ref 30.0–36.0)
MCV: 69 fL — ABNORMAL LOW (ref 80.0–100.0)
Platelets: 159 10*3/uL (ref 150–400)
RBC: 3.71 MIL/uL — ABNORMAL LOW (ref 4.22–5.81)
RDW: 25.2 % — ABNORMAL HIGH (ref 11.5–15.5)
WBC: 10.3 10*3/uL (ref 4.0–10.5)
nRBC: 0.2 % (ref 0.0–0.2)

## 2019-03-15 LAB — C-REACTIVE PROTEIN: CRP: 0.8 mg/dL (ref <1.0)

## 2019-03-15 MED ORDER — TORSEMIDE 20 MG PO TABS
20.0000 mg | ORAL_TABLET | Freq: Once | ORAL | Status: AC
  Administered 2019-03-15: 17:00:00 20 mg via ORAL
  Filled 2019-03-15: qty 1

## 2019-03-15 NOTE — Progress Notes (Signed)
PROGRESS NOTE  Shawn Frye [id number] DOB: [birth year] DOA: 03/10/2019 PCP: [name], MD   LOS: 4 days   Brief Narrative / Interim history: 85 year old male with medical history significant for cardiomyopathy with systolic congestive heart failure (last ejection fraction 20%), cardiac pacemaker with history of second-degree AV block, AAA, iron deficiency anemia and GERD; who presented to the hospital secondary to increased shortness of breath and coughing.  Patient found with significant elevated BNP, increased lower extremity swelling and chest x-ray demonstrating bilateral vascular congestion and diffuse increased interstitial opacities.  Patient has 24/7 care at home by caregivers, was exposed to positive COVID contact approx 2 weeks ago. Symptoms has been present and slightly worsening in the last 4 days.   Subjective / 24h Interval events: No complaints.  Denies any shortness of breath, denies any chest pain  Assessment & Plan: Active Problems:   Aneurysm of abdominal vessel (HCC)   Mobitz type 2 second degree atrioventricular block   Acute on chronic systolic CHF (congestive heart failure) (HCC)   Dilated cardiomyopathy (HCC)   Pacemaker   Acute on chronic combined systolic and diastolic CHF (congestive heart failure) (HCC)   Shortness of breath   COVID-19 virus infection   Principal Problem Covid-19 Viral Illness / Pneumonia -Patient admitted to the hospital with increased shortness of breath and coughing, he was tested positive for SARS-CoV-2 and chest x-ray showed pneumonia -Started on Decadron, continue, also started on remdesivir given infiltrates on the chest x-ray -Remains stable on room air, remdesivir's last dose is tomorrow 11/3  COVID-19 Labs  Recent Labs    03/13/19 0220 03/14/19 0143 03/15/19 0527  CRP 1.0* 0.8 0.8    Lab Results  Component Value Date   SARSCOV2NAA POSITIVE (A) 03/10/2019   [place] Not Detected 11/26/2018   Active Problems  Acute on chronic systolic CHF -On Lasix, there was a degree of fluid overload but creatinine was going up a little bit 10/31 and his blood pressure was soft, Lasix now on hold -Give a dose of Demadex today to avoid fluid overload.  Creatinine stable  AAA -Outpatient follow-up  Anemia of chronic disease -No bleeding, significant iron deficiency noted, started on iron supplementation -Transfuse 1 unit of packed red blood cells, hemoglobin slightly trended down but stable today compared to yesterday.  Continue to monitor.  No bleeding -Hemoglobin stable today  GERD -Continue PPI  Moderate protein malnutrition -Continue Ensure  Deconditioning/weakness -PT  Scheduled Meds: . dexamethasone (DECADRON) injection  6 mg Intravenous QHS  . feeding supplement (ENSURE ENLIVE)  237 mL Oral TID BM  . ferrous gluconate  324 mg Oral TID WC  . multivitamin with minerals  1 tablet Oral Daily  . pantoprazole  40 mg Oral Daily  . sodium chloride flush  3 mL Intravenous Q12H  . spironolactone  25 mg Oral BID  . torsemide  20 mg Oral Once   Continuous Infusions: . sodium chloride    . remdesivir 100 mg in NS 250 mL 100 mg (03/15/19 0812)   PRN Meds:.sodium chloride, acetaminophen, ALPRAZolam, ondansetron (ZOFRAN) IV, sodium chloride flush  DVT prophylaxis: SCDs Code Status: DNR Family Communication: d/w niece Johney Frame [phone] Disposition Plan: home when ready   Consultants:  None   Procedures:  None   Microbiology: None   Antimicrobials: None    Objective: Vitals:   03/15/19 0900 03/15/19 1000 03/15/19 1030 03/15/19 1130  BP: 97/63 103/65 (!) 132/59 97/66  Pulse: 72 80 85 77  Resp:  Temp:      TempSrc:      SpO2: 95% 97% 98% 98%  Weight:      Height:        Intake/Output Summary (Last 24 hours) at 03/15/2019 1253 Last data filed at 03/15/2019 0900 Gross per 24 hour  Intake 940 ml  Output 1000 ml  Net -60 ml   Filed Weights   03/10/19 1436 03/12/19  0230 03/14/19 0333  Weight: 63 kg 44.8 kg 52.1 kg    Examination:  Constitutional: NAD, comfortable Eyes: no scleral icterus  ENMT: mmm Respiratory: CTA biL, no wheezing  Cardiovascular: RRR, 3/6 SEM, no edema  Abdomen: soft, NT, ND, BS+ Musculoskeletal: no clubbing / cyanosis. Skin: no rashes seen  Neurologic: non focal    Data Reviewed: I have independently reviewed following labs and imaging studies   CBC: Recent Labs  Lab 03/10/19 1521 03/11/19 0553 03/11/19 1933 03/13/19 0220 03/14/19 0143 03/15/19 0527  WBC 4.5 5.0  --  9.6 10.0 10.3  NEUTROABS 2.7  --   --   --   --   --   HGB 7.1* 6.5* 7.7* 7.6* 7.5* 7.6*  HCT 25.3* 22.9* 26.6* 25.1* 25.2* 25.6*  MCV 68.9* 68.8*  --  67.8* 68.7* 69.0*  PLT 159 150  --  180 169 [id number]   Basic Metabolic Panel: Recent Labs  Lab 03/10/19 1521 03/11/19 0553 03/13/19 0220 03/14/19 0143 03/15/19 0527  NA 132* 136 130* 130* 129*  K 4.8 5.0 4.7 5.0 4.8  CL 106 110 100 101 99  CO2 18* 17* 19* 20* 22  GLUCOSE 102* 99 132* 182* 206*  BUN 30* 29* 44* 39* 41*  CREATININE 1.36* 1.24 1.32* 1.01 0.93  CALCIUM 8.7* 8.5* 8.5* 8.5* 8.6*  MG  --   --  1.9  --   --    GFR: Estimated Creatinine Clearance: 33.5 mL/min (by C-G formula based on SCr of 0.93 mg/dL). Liver Function Tests: Recent Labs  Lab 03/10/19 1521 03/13/19 0220 03/14/19 0143  AST 22 22 21   ALT 10 10 11   ALKPHOS 142* 109 95  BILITOT 1.4* 1.4* 1.0  PROT 7.8 6.9 6.4*  ALBUMIN 3.3* 3.0* 2.8*   No results for input(s): LIPASE, AMYLASE in the last 168 hours. No results for input(s): AMMONIA in the last 168 hours. Coagulation Profile: No results for input(s): INR, PROTIME in the last 168 hours. Cardiac Enzymes: No results for input(s): CKTOTAL, CKMB, CKMBINDEX, TROPONINI in the last 168 hours. BNP (last 3 results) No results for input(s): PROBNP in the last 8760 hours. HbA1C: No results for input(s): HGBA1C in the last 72 hours. CBG: No results for input(s): GLUCAP  in the last 168 hours. Lipid Profile: No results for input(s): CHOL, HDL, LDLCALC, TRIG, CHOLHDL, LDLDIRECT in the last 72 hours. Thyroid Function Tests: No results for input(s): TSH, T4TOTAL, FREET4, T3FREE, THYROIDAB in the last 72 hours. Anemia Panel: No results for input(s): VITAMINB12, FOLATE, FERRITIN, TIBC, IRON, RETICCTPCT in the last 72 hours. Urine analysis:    Component Value Date/Time   COLORURINE STRAW (A) 01/23/2018 1732   APPEARANCEUR CLEAR 01/23/2018 1732   LABSPEC 1.008 01/23/2018 1732   PHURINE 6.0 01/23/2018 1732   GLUCOSEU NEGATIVE 01/23/2018 1732   HGBUR NEGATIVE 01/23/2018 1732   BILIRUBINUR NEGATIVE 01/23/2018 1732   KETONESUR NEGATIVE 01/23/2018 1732   PROTEINUR NEGATIVE 01/23/2018 1732   NITRITE NEGATIVE 01/23/2018 1732   LEUKOCYTESUR TRACE (A) 01/23/2018 1732   Sepsis Labs: Invalid input(s): PROCALCITONIN, LACTICIDVEN  Recent Results (from the past 240 hour(s))  SARS CORONAVIRUS 2 (TAT 6-24 HRS) Nasopharyngeal Nasopharyngeal Swab     Status: Abnormal   Collection Time: 03/10/19  3:21 PM   Specimen: Nasopharyngeal Swab  Result Value Ref Range Status   SARS Coronavirus 2 POSITIVE (A) NEGATIVE Final    Comment: RESULT CALLED TO, READ BACK BY AND VERIFIED WITH: J HARDEN,RN 0104 03/11/2019 D BRADLEY (NOTE) SARS-CoV-2 target nucleic acids are DETECTED. The SARS-CoV-2 RNA is generally detectable in upper and lower respiratory specimens during the acute phase of infection. Positive results are indicative of active infection with SARS-CoV-2. Clinical  correlation with patient history and other diagnostic information is necessary to determine patient infection status. Positive results do  not rule out bacterial infection or co-infection with other viruses. The expected result is Negative. Fact Sheet for Patients: SugarRoll.be Fact Sheet for Healthcare Providers: https://www.woods-mathews.com/ This test is not yet  approved or cleared by the [place] FDA and  has been authorized for detection and/or diagnosis of SARS-CoV-2 by FDA under an Emergency Use Authorization (EUA). This EUA will remain  in effect (meaning this test can be used) for  the duration of the COVID-19 declaration under Section 564(b)(1) of the Act, 21 U.S.C. section 360bbb-3(b)(1), unless the authorization is terminated or revoked sooner. Performed at [facility], [place] [address] [place], [place] [address]       Radiology Studies: No results found.  Marzetta Board, MD, PhD [facility]  Contact via  [email or web address]  [place] P: [phone] F: [phone]

## 2019-03-15 NOTE — Progress Notes (Signed)
Physical Therapy Treatment Patient Details Name: Shawn Frye MRN: [id number] DOB: [birth year] Today's Date: 03/15/2019    History of Present Illness 85 year old male with medical history significant for cardiomyopathy with systolic congestive heart failure (last ejection fraction 20%), cardiac pacemaker with history of second-degree AV block, AAA, iron deficiency anemia and GERD; who presented to the hospital secondary to increased shortness of breath and coughing.  Patient found with significant elevated BNP, increased lower extremity swelling and chest x-ray demonstrating bilateral vascular congestion and diffuse increased interstitial opacities.  Patient has 24/7 care at home by caregivers, was exposed to positive COVID contact approx 2 weeks ago. Symptoms has been present and slightly worsening in the last 4 days.    PT Comments    Patient able to stand and transfer back to bed from recliner using RW. Patient  Should be able to start short ambulation next visit. Unsure of DC plan, patient had 24/7 caregivers PTA.   Follow Up Recommendations  Supervision/Assistance - 24 hour;SNF(depends on caregiver availability)     Equipment Recommendations  None recommended by PT    Recommendations for Other Services       Precautions / Restrictions Precautions Precautions: Fall    Mobility  Bed Mobility   Bed Mobility: Sit to Supine       Sit to supine: Max assist   General bed mobility comments: assist with legs and trunk back into bed  Transfers Overall transfer level: Needs assistance Equipment used: Rolling walker (2 wheeled) Transfers: Sit to/from [facility] Sit to Stand: Mod assist Stand pivot transfers: Mod assist       General transfer comment: steady assist to stand form recliner, small shuffle steps to bed, moves sowly, .  Ambulation/Gait                 Stairs             Wheelchair Mobility    Modified Rankin (Stroke Patients  Only)       Balance Overall balance assessment: Needs assistance                                          Cognition Arousal/Alertness: Awake/alert Behavior During Therapy: WFL for tasks assessed/performed Overall Cognitive Status: No family/caregiver present to determine baseline cognitive functioning                                        Exercises      General Comments        Pertinent Vitals/Pain Faces Pain Scale: Hurts little more Pain Location: with shoulder AROM Pain Descriptors / Indicators: Discomfort;Grimacing Pain Intervention(s): Monitored during session;Limited activity within patient's tolerance    Home Living                      Prior Function            PT Goals (current goals can now be found in the care plan section) Progress towards PT goals: Progressing toward goals    Frequency    Min 3X/week      PT Plan Current plan remains appropriate    Co-evaluation              AM-PAC PT "6 Clicks" Mobility   Outcome Measure  Help  needed turning from your back to your side while in a flat bed without using bedrails?: A Lot Help needed moving from lying on your back to sitting on the side of a flat bed without using bedrails?: A Lot Help needed moving to and from a bed to a chair (including a wheelchair)?: A Lot Help needed standing up from a chair using your arms (e.g., wheelchair or bedside chair)?: A Lot Help needed to walk in hospital room?: Total Help needed climbing 3-5 steps with a railing? : Total 6 Click Score: 10    End of Session   Activity Tolerance: Patient limited by fatigue Patient left: in bed;with call bell/phone within reach;with bed alarm set Nurse Communication: Mobility status PT Visit Diagnosis: Difficulty in walking, not elsewhere classified (R26.2)     Time: [id number] PT Time Calculation (min) (ACUTE ONLY): 27 min  Charges:  $Therapeutic Activity: 23-37 mins                      [place]  Office [phone]    Claretha Cooper 03/15/2019, 5:37 PM

## 2019-03-16 DIAGNOSIS — Z950 Presence of cardiac pacemaker: Secondary | ICD-10-CM

## 2019-03-16 LAB — COMPREHENSIVE METABOLIC PANEL
ALT: 11 U/L (ref 0–44)
AST: 21 U/L (ref 15–41)
Albumin: 2.8 g/dL — ABNORMAL LOW (ref 3.5–5.0)
Alkaline Phosphatase: 87 U/L (ref 38–126)
Anion gap: 8 (ref 5–15)
BUN: 45 mg/dL — ABNORMAL HIGH (ref 8–23)
CO2: 25 mmol/L (ref 22–32)
Calcium: 8.7 mg/dL — ABNORMAL LOW (ref 8.9–10.3)
Chloride: 96 mmol/L — ABNORMAL LOW (ref 98–111)
Creatinine, Ser: 1.1 mg/dL (ref 0.61–1.24)
GFR calc Af Amer: >60 mL/min (ref >60)
GFR calc non Af Amer: 56 mL/min — ABNORMAL LOW (ref >60)
Glucose, Bld: 193 mg/dL — ABNORMAL HIGH (ref 70–99)
Potassium: 4.4 mmol/L (ref 3.5–5.1)
Sodium: 129 mmol/L — ABNORMAL LOW (ref 135–145)
Total Bilirubin: 1.2 mg/dL (ref 0.3–1.2)
Total Protein: 6.3 g/dL — ABNORMAL LOW (ref 6.5–8.1)

## 2019-03-16 LAB — CBC
HCT: 27.7 % — ABNORMAL LOW (ref 39.0–52.0)
Hemoglobin: 8.2 g/dL — ABNORMAL LOW (ref 13.0–17.0)
MCH: 20.5 pg — ABNORMAL LOW (ref 26.0–34.0)
MCHC: 29.6 g/dL — ABNORMAL LOW (ref 30.0–36.0)
MCV: 69.3 fL — ABNORMAL LOW (ref 80.0–100.0)
Platelets: 162 10*3/uL (ref 150–400)
RBC: 4 MIL/uL — ABNORMAL LOW (ref 4.22–5.81)
RDW: 25.5 % — ABNORMAL HIGH (ref 11.5–15.5)
WBC: 11 10*3/uL — ABNORMAL HIGH (ref 4.0–10.5)
nRBC: 0 % (ref 0.0–0.2)

## 2019-03-16 MED ORDER — FERROUS GLUCONATE 324 (38 FE) MG PO TABS: 324.0000 mg | ORAL_TABLET | Freq: Two times a day (BID) | ORAL | 0 refills | Status: AC

## 2019-03-16 MED ORDER — DEXAMETHASONE 6 MG PO TABS: 6.0000 mg | ORAL_TABLET | Freq: Every day | ORAL | 0 refills | Status: AC

## 2019-03-16 NOTE — Care Management Important Message (Signed)
Important Message  Patient Details  Name: Shawn Frye MRN: [id number] Date of Birth: [birth year]   Medicare Important Message Given:  Yes - Important Message mailed due to current National Emergency  Verbal consent obtained due to current National Emergency  Relationship to patient: Niece/Nephew Contact Name: Johney Frame Call Date: 03/16/19  Time: 1508 Phone: [phone] Outcome: Spoke with contact Important Message mailed to: Other (must enter comment)(emailed to [email or web address])    Admir Candelas P Gracee Ratterree 03/16/2019, 3:09 PM

## 2019-03-16 NOTE — Progress Notes (Addendum)
2030 PTAR to arrive for DC within the hour.   2034 Pt found sitting in floor with sheets and blankets wrapped around him. No C/O or apparent injuries.  2040 Charge RN notiifed 2045 MD paged 2046 MD returned page No new orders received. Continue with DC. 2048 Philip Aspen called at [phone] Notified of fall, no new orders from MD and PTAR on its way.  2240 PTAR present to pick up pt. For transfer. NADN

## 2019-03-16 NOTE — TOC Transition Note (Signed)
Transition of Care [facility]) - CM/SW Discharge Note   Patient Details  Name: Shawn Frye MRN: [id number] Date of Birth: 01/21/1919  Transition of [facility]) CM/SW Contact:  Weston Anna, LCSW Phone Number: 03/16/2019, 12:11 PM   Clinical Narrative:     CSW spoke with patients niece, Ginger, via phone regarding discharge planning- agreeable to home health services. Prefers [facility]- sent referral to [facility]) and was accepted. All orders placed by MD. Patient will need transportation via [place] home.    Final next level of care: Home w Home Health Services Barriers to Discharge: No Barriers Identified   Patient Goals and CMS Choice   CMS Medicare.gov Compare Post Acute Care list provided to:: Patient Represenative (must comment)(HCPOA) Choice offered to / list presented to : [place] / [place]  Discharge Placement                  Name of family member notified: niece- ginger Patient and family notified of of transfer: 03/16/19  Discharge Plan and Services                DME Arranged: N/A         HH Arranged: PT, OT HH Agency: [facility] Agency Contacted: 03/16/19 Time HH Agency Contacted: 1210 Representative spoke with at [place] Interventions     Readmission Risk Interventions No flowsheet data found.

## 2019-03-16 NOTE — Progress Notes (Signed)
[facility]  Primary nurse spoke with Ginger Stickland (niece/POA) to review POC at 1445 and updated her with discharge instructions and transportation ETA at 1700. Primary nurse also spoke to patients private caregiver Kingsley Spittle at 1705 to review discharge instructions including medication regiment.Both POA and caregiver verbalized comprehension of discharge teaching and Covid quarentine s/p discharge home.  Primary RN removed telemetry monitor and PIVx2 while unit Nurse Tech assisted patient with gathering personal belongings. Patient awaiting PTAR at time of entry.

## 2019-03-16 NOTE — Discharge Instructions (Signed)
Follow with Hall, Shawn Z, MD in 5-7 days ? ?Please get a complete blood count and chemistry panel checked by your Primary MD at your next visit, and again as instructed by your Primary MD. Please get your medications reviewed and adjusted by your Primary MD. ? ?Please request your Primary MD to go over all Hospital Tests and Procedure/Radiological results at the follow up, please get all Hospital records sent to your Prim MD by signing hospital release before you go home. ? ?In some cases, there will be blood work, cultures and biopsy results pending at the time of your discharge. Please request that your primary care M.D. goes through all the records of your hospital data and follows up on these results. ? ?If you had Pneumonia of Lung problems at the Hospital: ?Please get a 2 view Chest X ray done in 6-8 weeks after hospital discharge or sooner if instructed by your Primary MD. ? ?If you have Congestive Heart Failure: ?Please call your Cardiologist or Primary MD anytime you have any of the following symptoms:  ?1) 3 pound weight gain in 24 hours or 5 pounds in 1 week  ?2) shortness of breath, with or without a dry hacking cough  ?3) swelling in the hands, feet or stomach  ?4) if you have to sleep on extra pillows at night in order to breathe ? ?Follow cardiac low salt diet and 1.5 lit/day fluid restriction. ? ?If you have diabetes ?Accuchecks 4 times/day, Once in AM empty stomach and then before each meal. ?Log in all results and show them to your primary doctor at your next visit. ?If any glucose reading is under 80 or above 300 call your primary MD immediately. ? ?If you have Seizure/Convulsions/Epilepsy: ?Please do not drive, operate heavy machinery, participate in activities at heights or participate in high speed sports until you have seen by Primary MD or a Neurologist and advised to do so again. ?Per [place] DMV statutes, patients with seizures are not allowed to drive until they have been  seizure-free for six months.  ?Use caution when using heavy equipment or power tools. Avoid working on ladders or at heights. Take showers instead of baths. Ensure the water temperature is not too high on the home water heater. Do not go swimming alone. Do not lock yourself in a room alone (i.e. bathroom). When caring for infants or small children, sit down when holding, feeding, or changing them to minimize risk of injury to the child in the event you have a seizure. Maintain good sleep hygiene. Avoid alcohol.  ? ?If you had Gastrointestinal Bleeding: ?Please ask your Primary MD to check a complete blood count within one week of discharge or at your next visit. Your endoscopic/colonoscopic biopsies that are pending at the time of discharge, will also need to followed by your Primary MD. ? ?Get Medicines reviewed and adjusted. ?Please take all your medications with you for your next visit with your Primary MD ? ?Please request your Primary MD to go over all hospital tests and procedure/radiological results at the follow up, please ask your Primary MD to get all Hospital records sent to his/her office. ? ?If you experience worsening of your admission symptoms, develop shortness of breath, life threatening emergency, suicidal or homicidal thoughts you must seek medical attention immediately by calling 911 or calling your MD immediately  if symptoms less severe. ? ?You must read complete instructions/literature along with all the possible adverse reactions/side effects for all the Medicines you   take and that have been prescribed to you. Take any new Medicines after you have completely understood and accpet all the possible adverse reactions/side effects.   Do not drive or operate heavy machinery when taking Pain medications.   Do not take more than prescribed Pain, Sleep and Anxiety Medications  Special Instructions: If you have smoked or chewed Tobacco  in the last 2 yrs please stop smoking, stop any regular  Alcohol  and or any Recreational drug use.  Wear Seat belts while driving.  Please note You were cared for by a hospitalist during your hospital stay. If you have any questions about your discharge medications or the care you received while you were in the hospital after you are discharged, you can call the unit and asked to speak with the hospitalist on call if the hospitalist that took care of you is not available. Once you are discharged, your primary care physician will handle any further medical issues. Please note that NO REFILLS for any discharge medications will be authorized once you are discharged, as it is imperative that you return to your primary care physician (or establish a relationship with a primary care physician if you do not have one) for your aftercare needs so that they can reassess your need for medications and monitor your lab values.  You can reach the hospitalist office at phone [phone] or fax [phone]   If you do not have a primary care physician, you can call [phone] for a physician referral.  Activity: As tolerated with Full fall precautions use walker/cane & assistance as needed    Diet: regular  Disposition Home

## 2019-03-16 NOTE — Discharge Summary (Addendum)
Physician Discharge Summary  Shawn Frye [id number] DOB: Jun 21, 1918 DOA: 03/10/2019  PCP: [name], MD  Admit date: 03/10/2019 Discharge date: 03/16/2019  Admitted From: home Disposition:  home  Recommendations for Outpatient Follow-up:  1. Follow up with PCP in 1-2 weeks 2. Please obtain BMP/CBC in one week  Home Health: PT, OT Equipment/Devices: walker  Discharge Condition: stable CODE STATUS: DNR Diet recommendation: regular  HPI: Per admitting MD, Shawn Frye  is a 85 y.o. male, with medical history significant forcardiomyopathy with systolic congestive heart failure and LVEF 20%, cardiac pacemaker with history of second-degree AV block, AAA, iron deficiency anemia, patient has 24/7 caregiver, he is under [facility] care at home, patient had exposure to COVID-19 patient before 2 weeks, his caregiver came to get COVID-19 test to brought him with her in the car, she brought him to ED hoping he can get faster results on COVID-19, she does report he has chronic dyspnea at baseline, as well he has cough, productive with white phlegm at baseline as well, she denies him having any fever or chills, but overall he is generally weak. - in ED patient hemoglobin was 7.1, but Hemoccult negative, he had no hypoxia, but he had JVD, with significantly elevated proBNP and lower extremity edema, I was called to admit for further management  Hospital Course: Covid-19 Viral Illness / Pneumonia -Patient admitted to the hospital with increased shortness of breath and coughing, he was tested positive for SARS-CoV-2 and chest x-ray showed pneumonia.  He was treated with remdesivir for a total of 5 days while hospitalized, and also also placed on steroids.  Clinically has improved, has remained stable on room air, and he is feeling back to baseline.  He will be discharged home in stable condition and will complete treatment with 5 additional days of steroids.  He had minimal inflammation and  no hypoxia. COVID-19 Labs  Recent Labs    03/14/19 0143 03/15/19 0527  CRP 0.8 0.8    Lab Results  Component Value Date   SARSCOV2NAA POSITIVE (A) 03/10/2019   [place] Not Detected 11/26/2018   Active Problems Acute on chronic systolic CHF -patient has been maintained on Lasix, he is euvolemic, continue home medications on discharge.  His creatinine has been stable. AAA -Outpatient follow-up Anemia of chronic disease -No bleeding, significant iron deficiency noted, started on iron supplementation.  Has received unit of packed red blood cells on admission, his hemoglobin is remained stable on discharge.  Continue to monitor as an outpatient. GERD -Continue PPI Moderate protein malnutrition -Continue Ensure Deconditioning/weakness -home health PT/OT Heart block status post pacemaker-stable  Discharge Diagnoses:  Active Problems:   Aneurysm of abdominal vessel (HCC)   Mobitz type 2 second degree atrioventricular block   Acute on chronic systolic CHF (congestive heart failure) (HCC)   Dilated cardiomyopathy (HCC)   Pacemaker   Acute on chronic combined systolic and diastolic CHF (congestive heart failure) (HCC)   Shortness of breath   COVID-19 virus infection     Discharge Instructions   Allergies as of 03/16/2019      Reactions   Percocet [oxycodone-acetaminophen] Hives   Noroxin [norfloxacin] Nausea And Vomiting   Celebrex [celecoxib] Rash      Medication List    TAKE these medications   acetaminophen 500 MG tablet Commonly known as: TYLENOL Take 500 mg by mouth daily as needed for mild pain or moderate pain.   ALPRAZolam 0.25 MG tablet Commonly known as: XANAX Take 0.125-0.25 mg by  mouth daily as needed for anxiety.   dexamethasone 6 MG tablet Commonly known as: DECADRON Take 1 tablet (6 mg total) by mouth daily.   feeding supplement (ENSURE ENLIVE) Liqd Take 237 mLs by mouth 2 (two) times daily between meals.   ferrous gluconate 324 MG  tablet Commonly known as: FERGON Take 1 tablet (324 mg total) by mouth 2 (two) times daily with a meal.   guaiFENesin 600 MG 12 hr tablet Commonly known as: MUCINEX Take 600 mg by mouth daily.   loratadine 10 MG tablet Commonly known as: CLARITIN Take 10 mg by mouth daily.   NYQUIL MULTI-SYMPTOM PO Take 5-10 mLs by mouth at bedtime as needed (for symptoms).   pantoprazole 40 MG tablet Commonly known as: Protonix Take 1 tablet (40 mg total) by mouth daily. What changed: when to take this   potassium chloride SA 20 MEQ tablet Commonly known as: KLOR-CON Take 10 mEq by mouth 2 (two) times daily.   spironolactone 25 MG tablet Commonly known as: ALDACTONE Take 25 mg by mouth 2 (two) times daily.   torsemide 20 MG tablet Commonly known as: DEMADEX Take 1 tablet (20 mg total) by mouth as needed. For wt gain over 3 lbs in 24 hrs      Follow-up Information    [name], MD. Schedule an appointment as soon as possible for a visit in 1 week(s).   Specialty: Internal Medicine Contact information: [facility] [address] [phone]        Satira Sark, MD .   Specialty: Cardiology Contact information: [place] [address] [phone]           Consultations:  None   Procedures/Studies:  Dg Chest Portable 1 View  Result Date: 03/10/2019 CLINICAL DATA:  Cough, COVID-19 exposure EXAM: PORTABLE CHEST 1 VIEW COMPARISON:  06/02/2018 FINDINGS: Dual lead left-sided implanted cardiac device. Stable mild cardiomegaly. Calcific aortic knob. Diffuse prominence of the pulmonary interstitium bilaterally, increased from prior. No pleural effusion or pneumothorax. Severe degenerative changes of the right shoulder. IMPRESSION: Diffusely increased interstitial opacities throughout both lungs, which may reflect edema or atypical/viral infection. Electronically Signed   By: Davina Poke M.D.   On: 03/10/2019 15:48       Subjective: - no chest pain, shortness of breath, no abdominal pain, nausea or vomiting.  He is feeling well.  Very hard of hearing.  Discharge Exam: BP (!) 100/56 (BP Location: Left Arm)    Pulse 64    Temp 97.9 F (36.6 C) (Axillary)    Resp 19    Ht 5\' 7"  (1.702 m)    Wt 52.1 kg    SpO2 93%    BMI 17.99 kg/m   General: Pt is alert, awake, not in acute distress Cardiovascular: RRR, S1/S2 +, no rubs, no gallops Respiratory: CTA bilaterally, no wheezing, no rhonchi Abdominal: Soft, NT, ND, bowel sounds + Extremities: no edema, no cyanosis    The results of significant diagnostics from this hospitalization (including imaging, microbiology, ancillary and laboratory) are listed below for reference.     Microbiology: Recent Results (from the past 240 hour(s))  SARS CORONAVIRUS 2 (TAT 6-24 HRS) Nasopharyngeal Nasopharyngeal Swab     Status: Abnormal   Collection Time: 03/10/19  3:21 PM   Specimen: Nasopharyngeal Swab  Result Value Ref Range Status   SARS Coronavirus 2 POSITIVE (A) NEGATIVE Final    Comment: RESULT CALLED TO, READ BACK BY AND VERIFIED WITH:  J HARDEN,RN 0104 03/11/2019 D BRADLEY (NOTE) SARS-CoV-2 target nucleic acids are DETECTED. The SARS-CoV-2 RNA is generally detectable in upper and lower respiratory specimens during the acute phase of infection. Positive results are indicative of active infection with SARS-CoV-2. Clinical  correlation with patient history and other diagnostic information is necessary to determine patient infection status. Positive results do  not rule out bacterial infection or co-infection with other viruses. The expected result is Negative. Fact Sheet for Patients: SugarRoll.be Fact Sheet for Healthcare Providers: https://www.woods-mathews.com/ This test is not yet approved or cleared by the [place] FDA and  has been authorized for detection and/or diagnosis of SARS-CoV-2 by FDA under  an Emergency Use Authorization (EUA). This EUA will remain  in effect (meaning this test can be used) for  the duration of the COVID-19 declaration under Section 564(b)(1) of the Act, 21 U.S.C. section 360bbb-3(b)(1), unless the authorization is terminated or revoked sooner. Performed at [facility], [place] [address] [place], [place] [address]      Labs: BNP (last 3 results) Recent Labs    06/02/18 1222 03/10/19 1521  BNP 1,695.0* 123456*   Basic Metabolic Panel: Recent Labs  Lab 03/11/19 0553 03/13/19 0220 03/14/19 0143 03/15/19 0527 03/16/19 0430  NA 136 130* 130* 129* 129*  K 5.0 4.7 5.0 4.8 4.4  CL 110 100 101 99 96*  CO2 17* 19* 20* 22 25  GLUCOSE 99 132* 182* 206* 193*  BUN 29* 44* 39* 41* 45*  CREATININE 1.24 1.32* 1.01 0.93 1.10  CALCIUM 8.5* 8.5* 8.5* 8.6* 8.7*  MG  --  1.9  --   --   --    Liver Function Tests: Recent Labs  Lab 03/10/19 1521 03/13/19 0220 03/14/19 0143 03/16/19 0430  AST 22 22 21 21   ALT 10 10 11 11   ALKPHOS 142* 109 95 87  BILITOT 1.4* 1.4* 1.0 1.2  PROT 7.8 6.9 6.4* 6.3*  ALBUMIN 3.3* 3.0* 2.8* 2.8*   No results for input(s): LIPASE, AMYLASE in the last 168 hours. No results for input(s): AMMONIA in the last 168 hours. CBC: Recent Labs  Lab 03/10/19 1521 03/11/19 0553 03/11/19 1933 03/13/19 0220 03/14/19 0143 03/15/19 0527 03/16/19 0430  WBC 4.5 5.0  --  9.6 10.0 10.3 11.0*  NEUTROABS 2.7  --   --   --   --   --   --   HGB 7.1* 6.5* 7.7* 7.6* 7.5* 7.6* 8.2*  HCT 25.3* 22.9* 26.6* 25.1* 25.2* 25.6* 27.7*  MCV 68.9* 68.8*  --  67.8* 68.7* 69.0* 69.3*  PLT 159 150  --  180 169 159 162   Cardiac Enzymes: No results for input(s): CKTOTAL, CKMB, CKMBINDEX, TROPONINI in the last 168 hours. BNP: Invalid input(s): POCBNP CBG: No results for input(s): GLUCAP in the last 168 hours. D-Dimer No results for input(s): DDIMER in the last 72 hours. Hgb A1c No results for input(s): HGBA1C in the last 72 hours. Lipid  Profile No results for input(s): CHOL, HDL, LDLCALC, TRIG, CHOLHDL, LDLDIRECT in the last 72 hours. Thyroid function studies No results for input(s): TSH, T4TOTAL, T3FREE, THYROIDAB in the last 72 hours.  Invalid input(s): FREET3 Anemia work up No results for input(s): VITAMINB12, FOLATE, FERRITIN, TIBC, IRON, RETICCTPCT in the last 72 hours. Urinalysis    Component Value Date/Time   COLORURINE STRAW (A) 01/23/2018 1732   APPEARANCEUR CLEAR 01/23/2018 1732   LABSPEC 1.008 01/23/2018 1732   PHURINE 6.0 01/23/2018 1732   GLUCOSEU NEGATIVE 01/23/2018  [place] 01/23/2018 [place] 01/23/2018 1732   KETONESUR NEGATIVE 01/23/2018 1732   PROTEINUR NEGATIVE 01/23/2018 1732   NITRITE NEGATIVE 01/23/2018 1732   LEUKOCYTESUR TRACE (A) 01/23/2018 1732   Sepsis Labs Invalid input(s): PROCALCITONIN,  WBC,  LACTICIDVEN  FURTHER DISCHARGE INSTRUCTIONS:   Get Medicines reviewed and adjusted: Please take all your medications with you for your next visit with your Primary MD   Laboratory/radiological data: Please request your Primary MD to go over all hospital tests and procedure/radiological results at the follow up, please ask your Primary MD to get all Hospital records sent to his/her office.   In some cases, they will be blood work, cultures and biopsy results pending at the time of your discharge. Please request that your primary care M.D. goes through all the records of your hospital data and follows up on these results.   Also Note the following: If you experience worsening of your admission symptoms, develop shortness of breath, life threatening emergency, suicidal or homicidal thoughts you must seek medical attention immediately by calling 911 or calling your MD immediately  if symptoms less severe.   You must read complete instructions/literature along with all the possible adverse reactions/side effects for all the Medicines you take and that have been  prescribed to you. Take any new Medicines after you have completely understood and accpet all the possible adverse reactions/side effects.    Do not drive when taking Pain medications or sleeping medications (Benzodaizepines)   Do not take more than prescribed Pain, Sleep and Anxiety Medications. It is not advisable to combine anxiety,sleep and pain medications without talking with your primary care practitioner   Special Instructions: If you have smoked or chewed Tobacco  in the last 2 yrs please stop smoking, stop any regular Alcohol  and or any Recreational drug use.   Wear Seat belts while driving.   Please note: You were cared for by a hospitalist during your hospital stay. Once you are discharged, your primary care physician will handle any further medical issues. Please note that NO REFILLS for any discharge medications will be authorized once you are discharged, as it is imperative that you return to your primary care physician (or establish a relationship with a primary care physician if you do not have one) for your post hospital discharge needs so that they can reassess your need for medications and monitor your lab values.  Time coordinating discharge: 35 minutes  SIGNED:  Marzetta Board, MD, PhD 03/16/2019, 11:10 AM

## 2019-03-17 NOTE — Consult Note (Signed)
   [facility] CM Inpatient Consult   03/17/2019  Chapin Brester [facility] 1918-11-28 [id number]    Patient checked for [facility] care management needs as a benefit from his Medicare/ [employer] A[employer] plan, with 18% risk for unplanned readmission and hospitalization.   Chart review reveals that patient is active with Hospice.  He has 24/7 caregiver and he is under [facility] at home.   Inpatient TOC SW note reviewed and patient's disposition is home with Home health PT/OT [facility]).  . No [facility] care management community follow-up needed, as patient will have full care under Hospice.  Will sign off.   For questions and additional information, please contact:  Candee Hoon A. Chene Kasinger, BSN, RN-BC [facility] Liaison Cell: [phone]

## 2019-04-13 DEATH — deceased

## 2020-03-09 IMAGING — DX DG CHEST 2V
2 series · 2 of 2 positions shown · non-contrast
Comparison: 07/04/2017

CLINICAL DATA: Shortness of breath.  Cough.

EXAM:
CHEST - 2 VIEW

[chest pa]
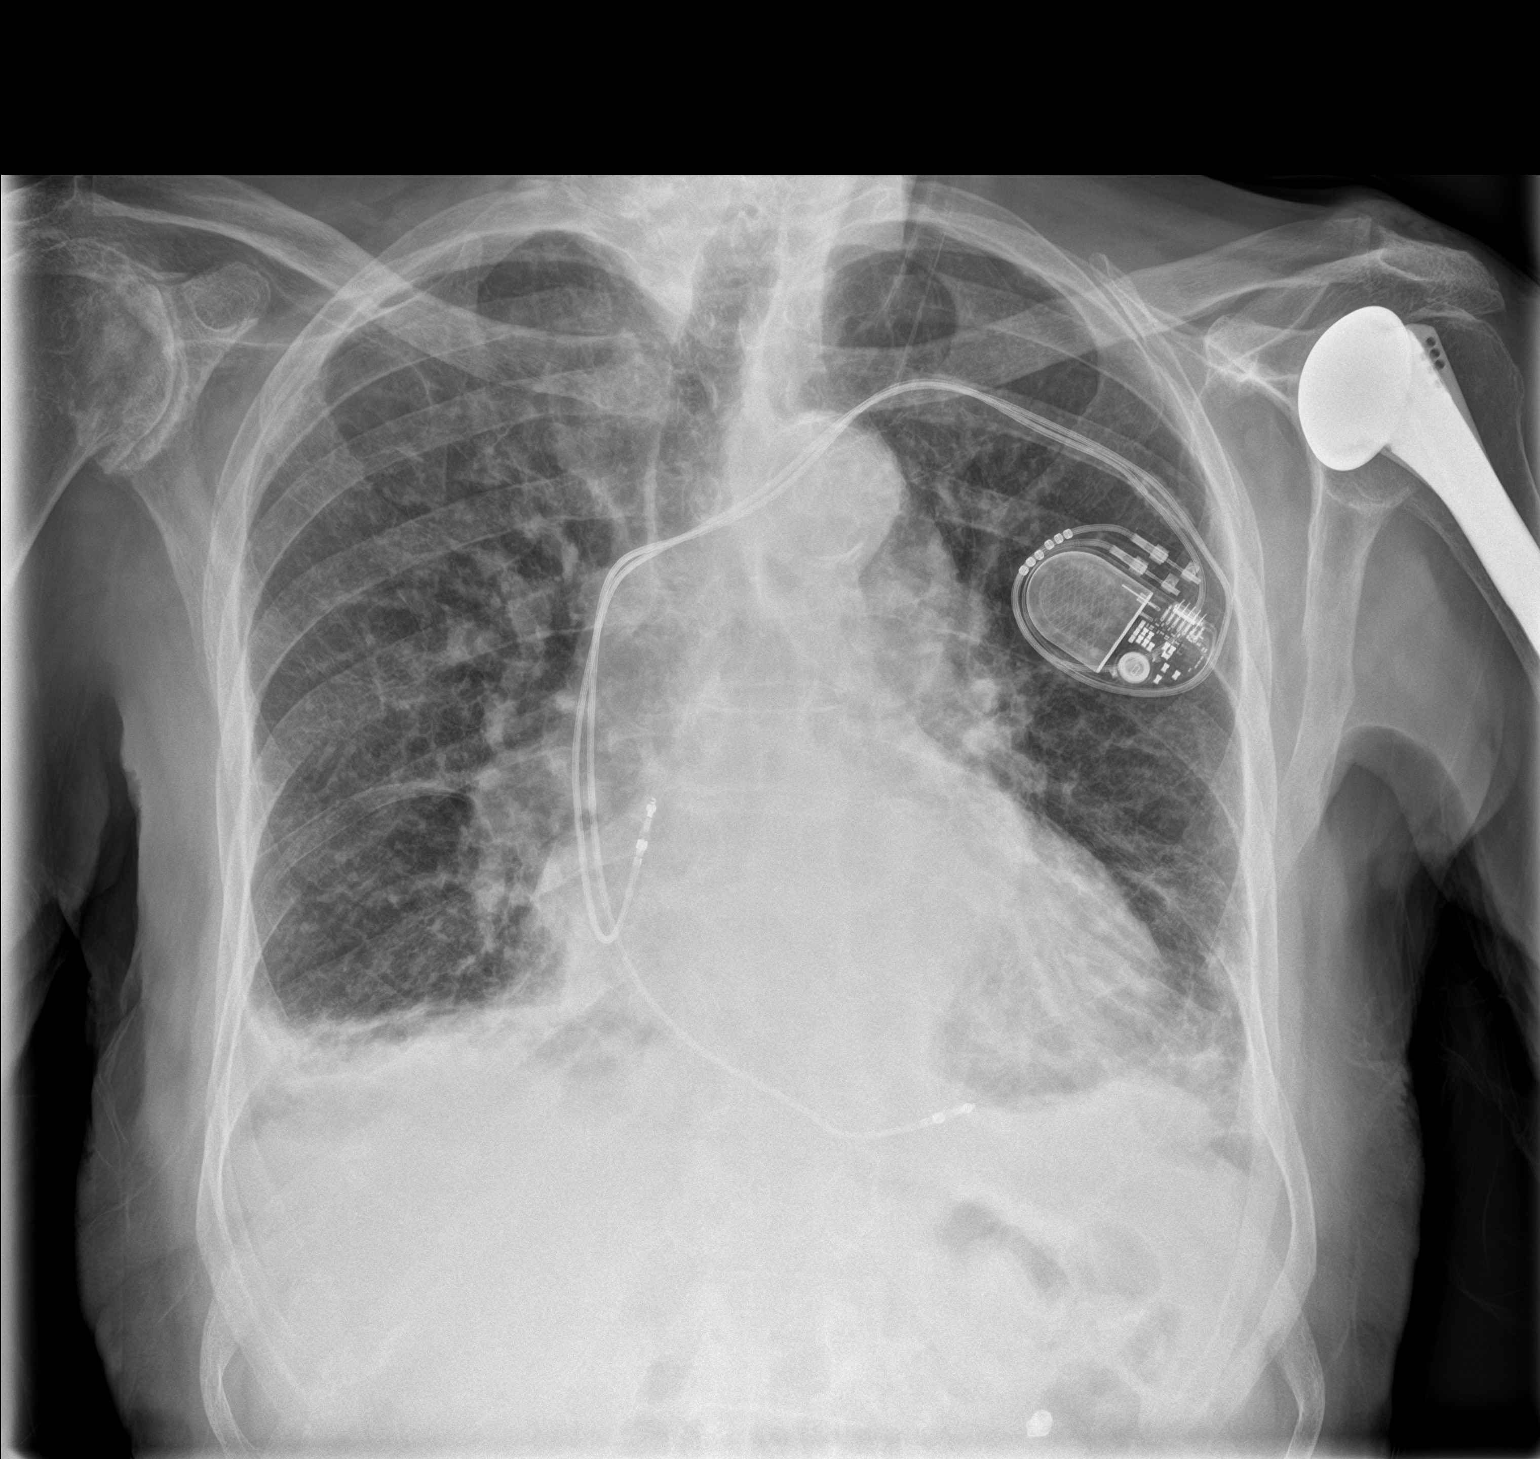

[chest lat]
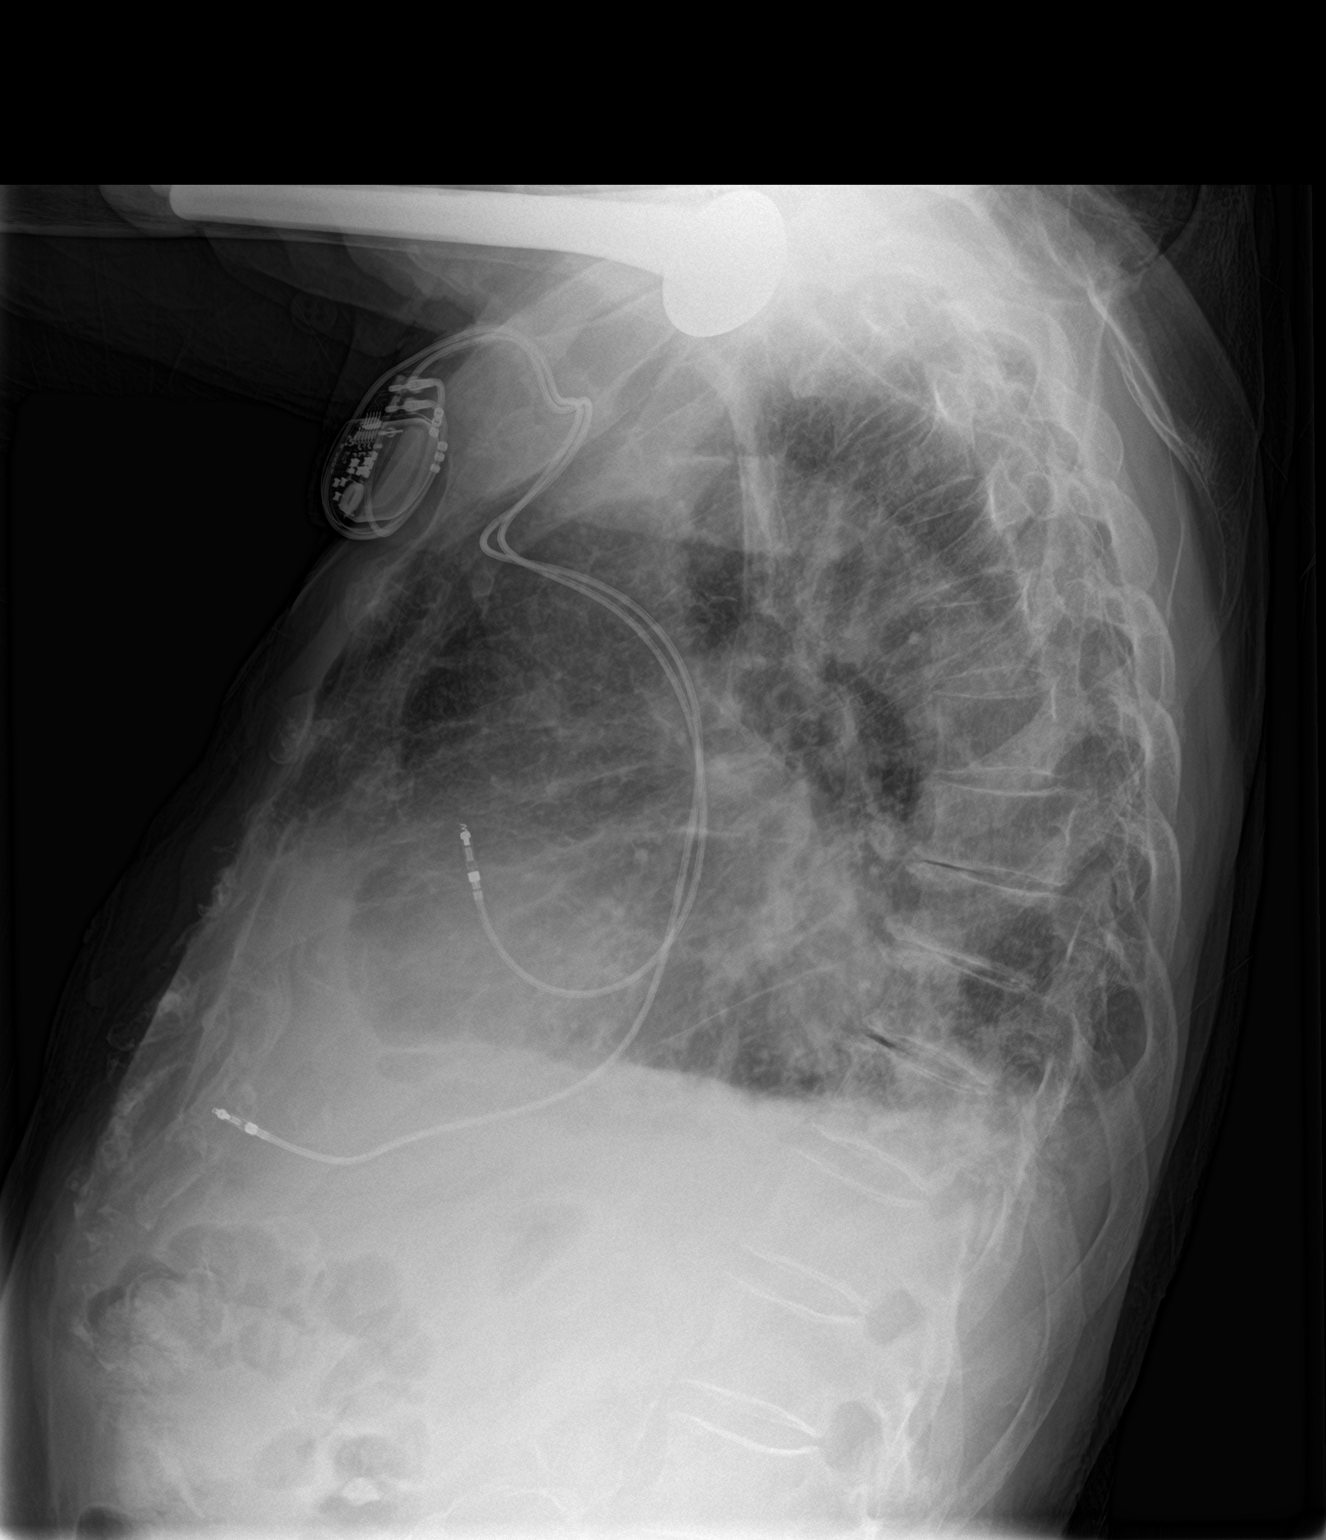

[2 of 2 positions shown; findings below may reference images not displayed]

FINDINGS: Left subclavian approach pacemaker remains in place with leads
terminating over the right atrium and right ventricle. The cardiac
silhouette is borderline to mildly enlarged. Thoracic aortic
atherosclerotic calcification and tortuosity are noted. Lung volumes
are lower than on the prior study with pulmonary vascular congestion
and mild diffuse increase in the interstitial markings bilaterally.
There are new small bilateral pleural effusions. No pneumothorax is
identified. Prior left shoulder arthroplasty and severe right
glenohumeral osteoarthrosis are noted.
IMPRESSION: Mild interstitial edema and small bilateral pleural effusions.

## 2020-03-18 IMAGING — DX DG CHEST 2V
2 series · 2 of 2 positions shown · non-contrast
Comparison: 12/04/2017

CLINICAL DATA: Shortness of breath

EXAM:
CHEST - 2 VIEW

[chest lat]
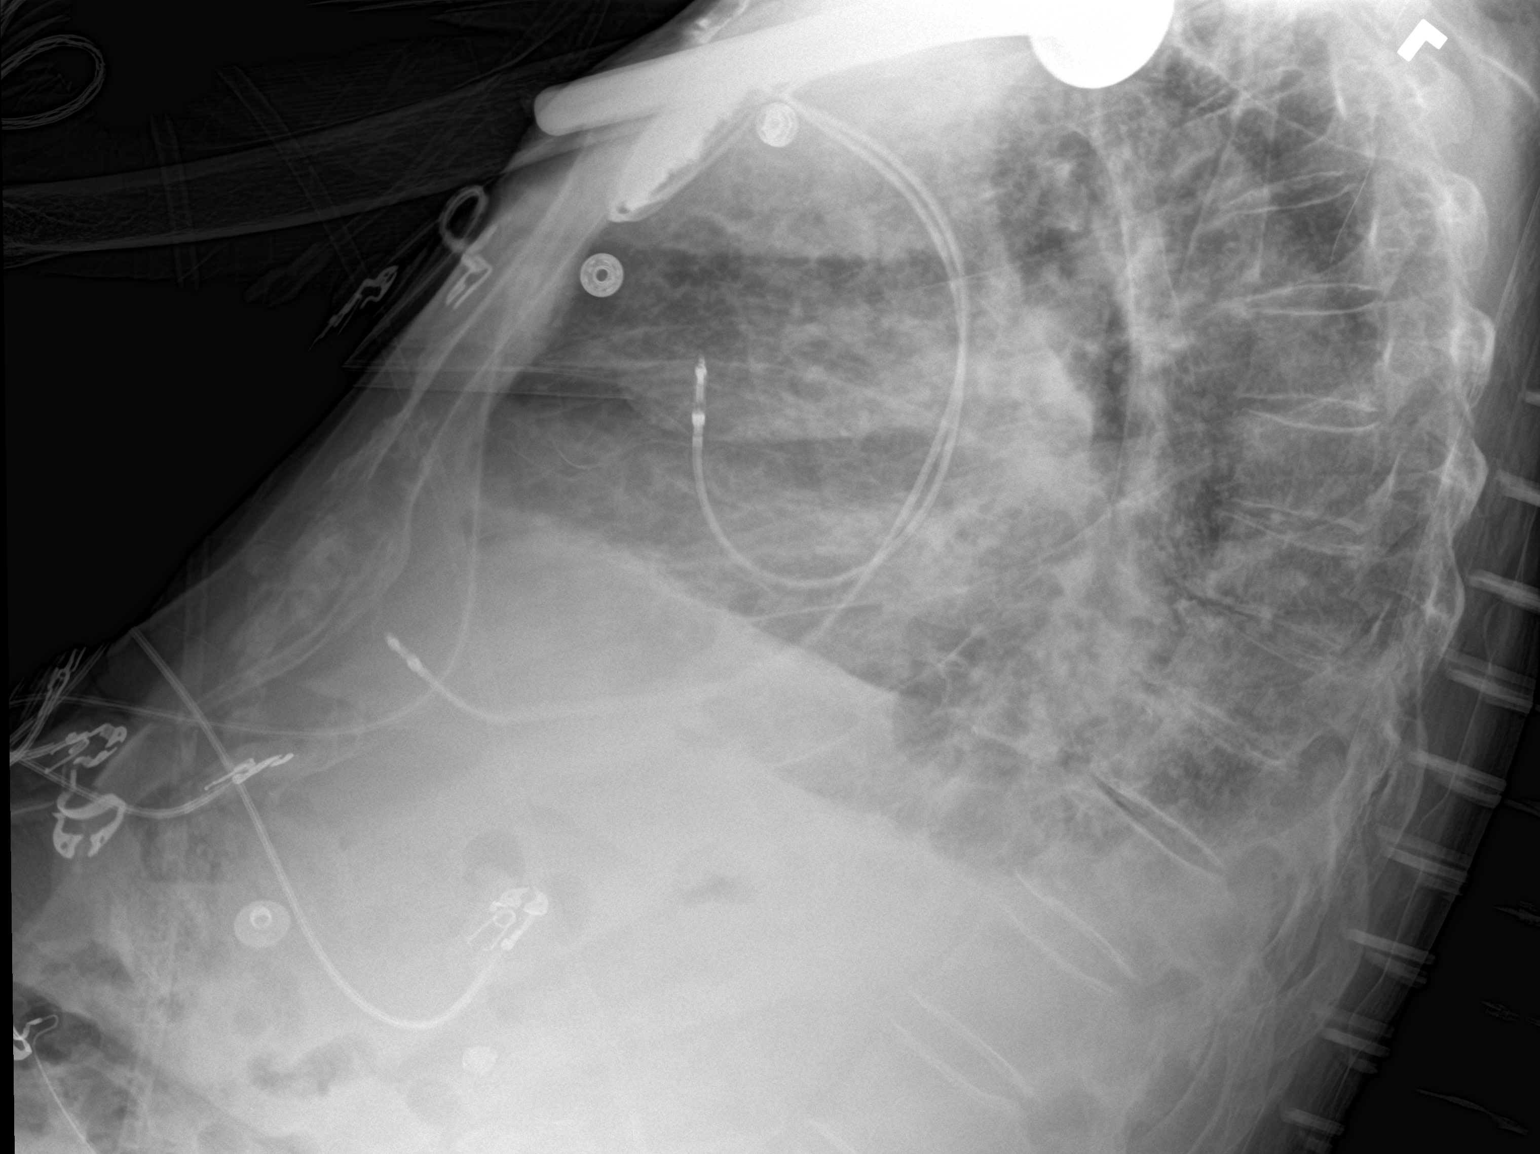

[chest ap]
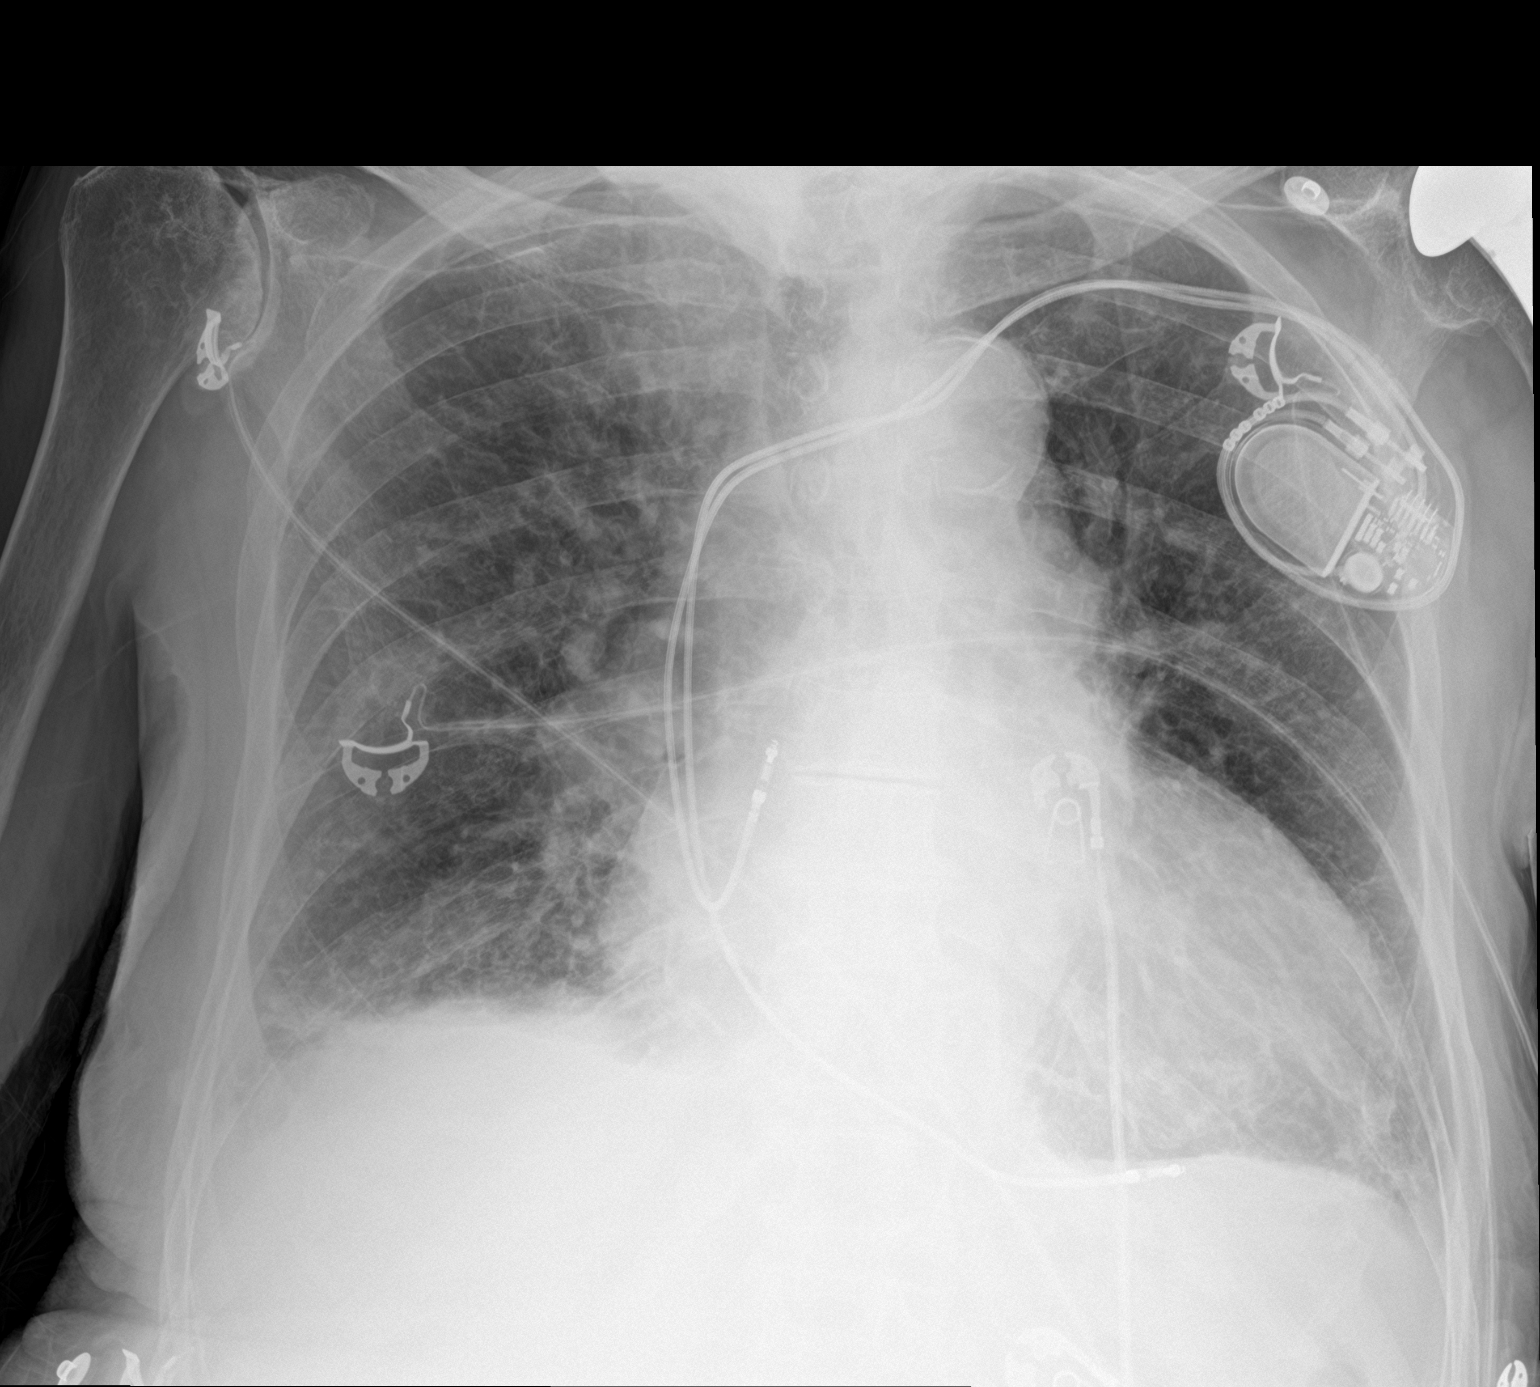

[2 of 2 positions shown; findings below may reference images not displayed]

FINDINGS: Cardiac enlargement is again seen. Pacing device is noted. Lungs are
well aerated with small right pleural effusion and right basilar
atelectasis stable from the prior exam. Very mild vascular
congestion is noted. Postsurgical changes in left shoulder are seen.
IMPRESSION: Mild vascular congestion with right basilar changes stable from the
prior exam.
